# Patient Record
Sex: Female | Born: 1935
Health system: Southern US, Community
[De-identification: ages and names within clinical notes are randomized; demographics above are authoritative.]

## PROBLEM LIST (undated history)

## (undated) DIAGNOSIS — C801 Malignant (primary) neoplasm, unspecified: Secondary | ICD-10-CM

## (undated) DIAGNOSIS — E785 Hyperlipidemia, unspecified: Secondary | ICD-10-CM

## (undated) DIAGNOSIS — I10 Essential (primary) hypertension: Secondary | ICD-10-CM

## (undated) HISTORY — PX: RECONSTRUCTION OF EYELID: SHX6576

## (undated) HISTORY — DX: Hyperlipidemia, unspecified: E78.5

## (undated) HISTORY — DX: Essential (primary) hypertension: I10

---

## 1989-06-29 HISTORY — PX: CHOLECYSTECTOMY: SHX55

## 2000-11-23 ENCOUNTER — Other Ambulatory Visit: Admission: RE | Admit: 2000-11-23 | Discharge: 2000-11-23 | Payer: Self-pay | Admitting: *Deleted

## 2003-06-30 HISTORY — PX: OTHER SURGICAL HISTORY: SHX169

## 2004-05-20 ENCOUNTER — Ambulatory Visit: Payer: Self-pay | Admitting: Family Medicine

## 2004-06-27 ENCOUNTER — Ambulatory Visit: Payer: Self-pay | Admitting: General Surgery

## 2004-06-27 LAB — HM COLONOSCOPY: HM COLON: NORMAL

## 2004-11-14 ENCOUNTER — Encounter: Payer: Self-pay | Admitting: Family Medicine

## 2004-11-27 ENCOUNTER — Encounter: Payer: Self-pay | Admitting: Family Medicine

## 2004-12-27 ENCOUNTER — Encounter: Payer: Self-pay | Admitting: Family Medicine

## 2005-09-11 ENCOUNTER — Ambulatory Visit: Payer: Self-pay | Admitting: Family Medicine

## 2006-11-08 ENCOUNTER — Ambulatory Visit: Payer: Self-pay | Admitting: Ophthalmology

## 2006-11-11 ENCOUNTER — Encounter: Payer: Self-pay | Admitting: Family Medicine

## 2006-11-15 ENCOUNTER — Ambulatory Visit: Payer: Self-pay | Admitting: Ophthalmology

## 2006-11-28 ENCOUNTER — Encounter: Payer: Self-pay | Admitting: Family Medicine

## 2006-12-08 ENCOUNTER — Ambulatory Visit: Payer: Self-pay | Admitting: Ophthalmology

## 2006-12-15 ENCOUNTER — Ambulatory Visit: Payer: Self-pay | Admitting: Ophthalmology

## 2006-12-21 ENCOUNTER — Ambulatory Visit: Payer: Self-pay | Admitting: Family Medicine

## 2006-12-28 ENCOUNTER — Encounter: Payer: Self-pay | Admitting: Family Medicine

## 2007-01-28 ENCOUNTER — Encounter: Payer: Self-pay | Admitting: Family Medicine

## 2008-01-16 ENCOUNTER — Encounter: Payer: Self-pay | Admitting: Family Medicine

## 2008-01-26 ENCOUNTER — Ambulatory Visit: Payer: Self-pay | Admitting: Family Medicine

## 2008-01-28 ENCOUNTER — Encounter: Payer: Self-pay | Admitting: Family Medicine

## 2008-02-28 ENCOUNTER — Encounter: Payer: Self-pay | Admitting: Family Medicine

## 2008-09-25 ENCOUNTER — Ambulatory Visit: Payer: Self-pay

## 2009-01-23 ENCOUNTER — Ambulatory Visit: Payer: Self-pay | Admitting: Family Medicine

## 2009-06-29 DIAGNOSIS — C9110 Chronic lymphocytic leukemia of B-cell type not having achieved remission: Secondary | ICD-10-CM

## 2009-06-29 HISTORY — DX: Chronic lymphocytic leukemia of B-cell type not having achieved remission: C91.10

## 2010-04-09 ENCOUNTER — Ambulatory Visit: Payer: Self-pay | Admitting: Family Medicine

## 2010-05-19 ENCOUNTER — Ambulatory Visit: Payer: Self-pay | Admitting: Internal Medicine

## 2010-05-29 ENCOUNTER — Ambulatory Visit: Payer: Self-pay | Admitting: Internal Medicine

## 2010-06-29 ENCOUNTER — Ambulatory Visit: Payer: Self-pay | Admitting: Internal Medicine

## 2010-07-30 ENCOUNTER — Ambulatory Visit: Payer: Self-pay | Admitting: Internal Medicine

## 2010-08-28 ENCOUNTER — Ambulatory Visit: Payer: Self-pay | Admitting: Internal Medicine

## 2010-09-28 ENCOUNTER — Ambulatory Visit: Payer: Self-pay | Admitting: Internal Medicine

## 2010-10-31 ENCOUNTER — Ambulatory Visit: Payer: Self-pay | Admitting: Internal Medicine

## 2010-11-28 ENCOUNTER — Ambulatory Visit: Payer: Self-pay | Admitting: Internal Medicine

## 2011-01-09 ENCOUNTER — Ambulatory Visit: Payer: Self-pay | Admitting: Internal Medicine

## 2011-01-28 ENCOUNTER — Ambulatory Visit: Payer: Self-pay | Admitting: Internal Medicine

## 2011-04-13 ENCOUNTER — Ambulatory Visit: Payer: Self-pay | Admitting: Internal Medicine

## 2011-04-28 ENCOUNTER — Ambulatory Visit: Payer: Self-pay | Admitting: Family Medicine

## 2011-04-30 ENCOUNTER — Ambulatory Visit: Payer: Self-pay | Admitting: Internal Medicine

## 2011-07-15 ENCOUNTER — Ambulatory Visit: Payer: Self-pay | Admitting: Internal Medicine

## 2011-07-15 LAB — CBC CANCER CENTER
Basophil #: 0.4 x10 3/mm — ABNORMAL HIGH (ref 0.0–0.1)
Basophil %: 2.9 %
Eosinophil %: 0.8 %
HCT: 41.8 % (ref 35.0–47.0)
HGB: 13.8 g/dL (ref 12.0–16.0)
Lymphocyte #: 8 x10 3/mm — ABNORMAL HIGH (ref 1.0–3.6)
Lymphocyte %: 57.9 %
MCV: 84 fL (ref 80–100)
Monocyte %: 3 %
Neutrophil #: 4.9 x10 3/mm (ref 1.4–6.5)
Platelet: 292 x10 3/mm (ref 150–440)
RBC: 4.96 10*6/uL (ref 3.80–5.20)
RDW: 13.4 % (ref 11.5–14.5)
WBC: 13.8 x10 3/mm — ABNORMAL HIGH (ref 3.6–11.0)

## 2011-07-15 LAB — CREATININE, SERUM: EGFR (Non-African Amer.): >60

## 2011-07-15 LAB — LACTATE DEHYDROGENASE: LDH: 234 U/L (ref 84–246)

## 2011-07-15 LAB — URIC ACID: Uric Acid: 4.3 mg/dL (ref 2.6–6.0)

## 2011-07-31 ENCOUNTER — Ambulatory Visit: Payer: Self-pay | Admitting: Internal Medicine

## 2011-10-14 ENCOUNTER — Ambulatory Visit: Payer: Self-pay | Admitting: Internal Medicine

## 2011-10-14 LAB — CBC CANCER CENTER
Basophil %: 0.3 %
Eosinophil #: 0.1 x10 3/mm (ref 0.0–0.7)
HGB: 13.3 g/dL (ref 12.0–16.0)
Lymphocyte #: 6.8 x10 3/mm — ABNORMAL HIGH (ref 1.0–3.6)
Lymphocyte %: 56.3 %
MCH: 27.4 pg (ref 26.0–34.0)
Monocyte %: 5.7 %
Neutrophil #: 4.4 x10 3/mm (ref 1.4–6.5)
Platelet: 270 x10 3/mm (ref 150–440)
RBC: 4.88 10*6/uL (ref 3.80–5.20)

## 2011-10-14 LAB — URIC ACID: Uric Acid: 4.4 mg/dL (ref 2.6–6.0)

## 2011-10-14 LAB — CREATININE, SERUM
Creatinine: 0.63 mg/dL (ref 0.60–1.30)
EGFR (Non-African Amer.): >60

## 2011-10-28 ENCOUNTER — Ambulatory Visit: Payer: Self-pay | Admitting: Internal Medicine

## 2012-02-15 ENCOUNTER — Ambulatory Visit: Payer: Self-pay | Admitting: Internal Medicine

## 2012-02-15 LAB — HEPATIC FUNCTION PANEL A (ARMC)
Albumin: 3.7 g/dL (ref 3.4–5.0)
Alkaline Phosphatase: 89 U/L (ref 50–136)
Bilirubin, Direct: 0.1 mg/dL (ref 0.00–0.20)
Bilirubin,Total: 0.4 mg/dL (ref 0.2–1.0)
SGOT(AST): 25 U/L (ref 15–37)
SGPT (ALT): 27 U/L (ref 12–78)
Total Protein: 6.9 g/dL (ref 6.4–8.2)

## 2012-02-15 LAB — CBC CANCER CENTER
Basophil #: 0.1 x10 3/mm (ref 0.0–0.1)
Basophil %: 0.7 %
Eosinophil #: 0.1 x10 3/mm (ref 0.0–0.7)
Eosinophil %: 0.9 %
HCT: 41.7 % (ref 35.0–47.0)
HGB: 13.4 g/dL (ref 12.0–16.0)
Lymphocyte #: 7.6 x10 3/mm — ABNORMAL HIGH (ref 1.0–3.6)
Lymphocyte %: 53.2 %
MCH: 27.1 pg (ref 26.0–34.0)
MCHC: 32.1 g/dL (ref 32.0–36.0)
MCV: 84 fL (ref 80–100)
Monocyte #: 0.5 x10 3/mm (ref 0.2–0.9)
Monocyte %: 3.8 %
Neutrophil #: 6 x10 3/mm (ref 1.4–6.5)
Neutrophil %: 41.4 %
Platelet: 303 x10 3/mm (ref 150–440)
RBC: 4.94 10*6/uL (ref 3.80–5.20)
RDW: 13.6 % (ref 11.5–14.5)
WBC: 14.4 x10 3/mm — ABNORMAL HIGH (ref 3.6–11.0)

## 2012-02-15 LAB — URIC ACID: Uric Acid: 4.3 mg/dL (ref 2.6–6.0)

## 2012-02-15 LAB — CREATININE, SERUM
Creatinine: 0.76 mg/dL (ref 0.60–1.30)
EGFR (African American): >60
EGFR (Non-African Amer.): >60

## 2012-02-28 ENCOUNTER — Ambulatory Visit: Payer: Self-pay | Admitting: Internal Medicine

## 2012-03-25 LAB — HM PAP SMEAR

## 2012-04-28 ENCOUNTER — Ambulatory Visit: Payer: Self-pay | Admitting: Family Medicine

## 2012-04-28 LAB — HM MAMMOGRAPHY

## 2012-06-29 ENCOUNTER — Ambulatory Visit: Payer: Self-pay | Admitting: Internal Medicine

## 2012-07-11 LAB — CBC CANCER CENTER
Basophil #: 0 x10 3/mm (ref 0.0–0.1)
Basophil %: 0.2 %
Eosinophil #: 0.1 x10 3/mm (ref 0.0–0.7)
Eosinophil %: 0.5 %
Lymphocyte #: 11.3 x10 3/mm — ABNORMAL HIGH (ref 1.0–3.6)
Lymphocyte %: 63.1 %
MCH: 27.3 pg (ref 26.0–34.0)
MCV: 82 fL (ref 80–100)
Monocyte #: 1.1 x10 3/mm — ABNORMAL HIGH (ref 0.2–0.9)
Neutrophil #: 5.4 x10 3/mm (ref 1.4–6.5)
Neutrophil %: 30.2 %
RDW: 13.4 % (ref 11.5–14.5)
WBC: 17.9 x10 3/mm — ABNORMAL HIGH (ref 3.6–11.0)

## 2012-07-11 LAB — CREATININE, SERUM
EGFR (African American): >60
EGFR (Non-African Amer.): >60

## 2012-07-11 LAB — LACTATE DEHYDROGENASE: LDH: 199 U/L (ref 81–246)

## 2012-07-30 ENCOUNTER — Ambulatory Visit: Payer: Self-pay | Admitting: Internal Medicine

## 2012-08-15 LAB — CBC CANCER CENTER
Basophil #: 0.2 x10 3/mm — ABNORMAL HIGH (ref 0.0–0.1)
Basophil %: 1.2 %
Eosinophil #: 0.1 x10 3/mm (ref 0.0–0.7)
Eosinophil %: 1 %
HCT: 41.4 % (ref 35.0–47.0)
Lymphocyte #: 8.8 x10 3/mm — ABNORMAL HIGH (ref 1.0–3.6)
Lymphocyte %: 59.8 %
MCH: 27.4 pg (ref 26.0–34.0)
MCV: 83 fL (ref 80–100)
Monocyte #: 1 x10 3/mm — ABNORMAL HIGH (ref 0.2–0.9)
Monocyte %: 6.4 %
Neutrophil %: 31.6 %
RBC: 5.01 10*6/uL (ref 3.80–5.20)
RDW: 13.7 % (ref 11.5–14.5)
WBC: 14.7 x10 3/mm — ABNORMAL HIGH (ref 3.6–11.0)

## 2012-08-27 ENCOUNTER — Ambulatory Visit: Payer: Self-pay | Admitting: Internal Medicine

## 2012-09-27 ENCOUNTER — Ambulatory Visit: Payer: Self-pay | Admitting: Internal Medicine

## 2012-10-10 LAB — CBC CANCER CENTER
Basophil #: 0.2 x10 3/mm — ABNORMAL HIGH (ref 0.0–0.1)
Eosinophil #: 0.1 x10 3/mm (ref 0.0–0.7)
Eosinophil %: 0.6 %
HCT: 41.4 % (ref 35.0–47.0)
MCH: 27 pg (ref 26.0–34.0)
MCV: 82 fL (ref 80–100)
Monocyte #: 1 x10 3/mm — ABNORMAL HIGH (ref 0.2–0.9)
Neutrophil #: 6.3 x10 3/mm (ref 1.4–6.5)
Neutrophil %: 38.2 %
Platelet: 285 x10 3/mm (ref 150–440)
RDW: 13.9 % (ref 11.5–14.5)
WBC: 17 x10 3/mm — ABNORMAL HIGH (ref 3.6–11.0)

## 2012-10-10 LAB — LACTATE DEHYDROGENASE: LDH: 258 U/L — ABNORMAL HIGH (ref 81–246)

## 2012-10-10 LAB — CREATININE, SERUM
Creatinine: 0.92 mg/dL (ref 0.60–1.30)
EGFR (African American): >60
EGFR (Non-African Amer.): >60

## 2012-10-27 ENCOUNTER — Ambulatory Visit: Payer: Self-pay | Admitting: Internal Medicine

## 2012-11-27 ENCOUNTER — Ambulatory Visit: Payer: Self-pay | Admitting: Internal Medicine

## 2012-12-19 LAB — CBC CANCER CENTER
Basophil %: 0.2 %
Eosinophil #: 0.1 x10 3/mm (ref 0.0–0.7)
Eosinophil %: 0.5 %
HCT: 39.8 % (ref 35.0–47.0)
Monocyte %: 5.6 %
Platelet: 287 x10 3/mm (ref 150–440)
RDW: 13.5 % (ref 11.5–14.5)

## 2012-12-19 LAB — CREATININE, SERUM: Creatinine: 0.88 mg/dL (ref 0.60–1.30)

## 2012-12-27 ENCOUNTER — Ambulatory Visit: Payer: Self-pay | Admitting: Internal Medicine

## 2013-02-16 ENCOUNTER — Ambulatory Visit: Payer: Self-pay | Admitting: Internal Medicine

## 2013-02-16 LAB — CBC CANCER CENTER
Basophil %: 1.4 %
HGB: 13.3 g/dL (ref 12.0–16.0)
Lymphocyte #: 8.8 x10 3/mm — ABNORMAL HIGH (ref 1.0–3.6)
Lymphocyte %: 57.1 %
MCH: 27.2 pg (ref 26.0–34.0)
MCHC: 33.5 g/dL (ref 32.0–36.0)
MCV: 81 fL (ref 80–100)
Monocyte #: 0.9 x10 3/mm (ref 0.2–0.9)
Monocyte %: 6 %
Neutrophil %: 35 %
Platelet: 271 x10 3/mm (ref 150–440)
RBC: 4.89 10*6/uL (ref 3.80–5.20)
WBC: 15.5 x10 3/mm — ABNORMAL HIGH (ref 3.6–11.0)

## 2013-02-16 LAB — CREATININE, SERUM
Creatinine: 0.93 mg/dL (ref 0.60–1.30)
EGFR (Non-African Amer.): 60 — ABNORMAL LOW

## 2013-02-16 LAB — LACTATE DEHYDROGENASE: LDH: 204 U/L (ref 81–246)

## 2013-02-27 ENCOUNTER — Ambulatory Visit: Payer: Self-pay | Admitting: Internal Medicine

## 2013-04-13 ENCOUNTER — Ambulatory Visit: Payer: Self-pay | Admitting: Internal Medicine

## 2013-04-13 ENCOUNTER — Ambulatory Visit: Payer: Self-pay | Admitting: Family Medicine

## 2013-04-13 LAB — CREATININE, SERUM
EGFR (African American): >60
EGFR (Non-African Amer.): >60

## 2013-04-13 LAB — CBC CANCER CENTER
Basophil #: 0.2 x10 3/mm — ABNORMAL HIGH (ref 0.0–0.1)
Basophil %: 1.1 %
Eosinophil #: 0.1 x10 3/mm (ref 0.0–0.7)
HCT: 42.3 % (ref 35.0–47.0)
HGB: 13.9 g/dL (ref 12.0–16.0)
Lymphocyte #: 9 x10 3/mm — ABNORMAL HIGH (ref 1.0–3.6)
Lymphocyte %: 58 %
MCH: 27.3 pg (ref 26.0–34.0)
MCV: 83 fL (ref 80–100)
Monocyte #: 0.8 x10 3/mm (ref 0.2–0.9)
Monocyte %: 5 %
Neutrophil #: 5.5 x10 3/mm (ref 1.4–6.5)
Neutrophil %: 35.1 %
Platelet: 315 x10 3/mm (ref 150–440)

## 2013-04-13 LAB — LACTATE DEHYDROGENASE: LDH: 210 U/L (ref 81–246)

## 2013-04-29 ENCOUNTER — Ambulatory Visit: Payer: Self-pay | Admitting: Internal Medicine

## 2013-07-11 ENCOUNTER — Ambulatory Visit: Payer: Self-pay | Admitting: Internal Medicine

## 2013-07-11 LAB — CBC CANCER CENTER
BASOS PCT: 1.4 %
Basophil #: 0.2 x10 3/mm — ABNORMAL HIGH (ref 0.0–0.1)
Eosinophil #: 0.1 x10 3/mm (ref 0.0–0.7)
Eosinophil %: 0.8 %
HCT: 40.5 % (ref 35.0–47.0)
HGB: 13.3 g/dL (ref 12.0–16.0)
LYMPHS ABS: 8.3 x10 3/mm — AB (ref 1.0–3.6)
Lymphocyte %: 60.6 %
MCH: 26.9 pg (ref 26.0–34.0)
MCHC: 32.8 g/dL (ref 32.0–36.0)
MCV: 82 fL (ref 80–100)
MONO ABS: 0.8 x10 3/mm (ref 0.2–0.9)
MONOS PCT: 5.7 %
NEUTROS ABS: 4.3 x10 3/mm (ref 1.4–6.5)
Neutrophil %: 31.5 %
Platelet: 276 x10 3/mm (ref 150–440)
RBC: 4.95 10*6/uL (ref 3.80–5.20)
RDW: 13.9 % (ref 11.5–14.5)
WBC: 13.7 x10 3/mm — ABNORMAL HIGH (ref 3.6–11.0)

## 2013-07-11 LAB — CREATININE, SERUM
Creatinine: 0.65 mg/dL (ref 0.60–1.30)
EGFR (African American): >60

## 2013-07-11 LAB — LACTATE DEHYDROGENASE: LDH: 210 U/L (ref 81–246)

## 2013-07-30 ENCOUNTER — Ambulatory Visit: Payer: Self-pay | Admitting: Internal Medicine

## 2013-08-27 ENCOUNTER — Ambulatory Visit: Payer: Self-pay | Admitting: Internal Medicine

## 2013-11-14 ENCOUNTER — Ambulatory Visit: Payer: Self-pay | Admitting: Internal Medicine

## 2013-11-15 LAB — CBC CANCER CENTER
BASOS ABS: 0.2 x10 3/mm — AB (ref 0.0–0.1)
Basophil %: 1.2 %
EOS PCT: 0.7 %
Eosinophil #: 0.1 x10 3/mm (ref 0.0–0.7)
HCT: 39.3 % (ref 35.0–47.0)
HGB: 13.3 g/dL (ref 12.0–16.0)
LYMPHS ABS: 8.4 x10 3/mm — AB (ref 1.0–3.6)
Lymphocyte %: 62.4 %
MCH: 27.7 pg (ref 26.0–34.0)
MCHC: 33.9 g/dL (ref 32.0–36.0)
MCV: 82 fL (ref 80–100)
Monocyte #: 0.6 x10 3/mm (ref 0.2–0.9)
Monocyte %: 4.7 %
NEUTROS ABS: 4.2 x10 3/mm (ref 1.4–6.5)
NEUTROS PCT: 31 %
Platelet: 272 x10 3/mm (ref 150–440)
RBC: 4.81 10*6/uL (ref 3.80–5.20)
RDW: 12.9 % (ref 11.5–14.5)
WBC: 13.5 x10 3/mm — AB (ref 3.6–11.0)

## 2013-11-15 LAB — CREATININE, SERUM
Creatinine: 0.66 mg/dL (ref 0.60–1.30)
EGFR (Non-African Amer.): >60

## 2013-11-15 LAB — LACTATE DEHYDROGENASE: LDH: 193 U/L (ref 81–246)

## 2013-11-15 LAB — URIC ACID: URIC ACID: 4.8 mg/dL (ref 2.6–6.0)

## 2013-11-27 ENCOUNTER — Ambulatory Visit: Payer: Self-pay | Admitting: Internal Medicine

## 2014-03-19 ENCOUNTER — Ambulatory Visit: Payer: Self-pay | Admitting: Internal Medicine

## 2014-03-19 LAB — CBC CANCER CENTER
BASOS ABS: 0.2 x10 3/mm — AB (ref 0.0–0.1)
BASOS PCT: 1.2 %
Eosinophil #: 0.1 x10 3/mm (ref 0.0–0.7)
Eosinophil %: 0.7 %
HCT: 41.2 % (ref 35.0–47.0)
HGB: 13.4 g/dL (ref 12.0–16.0)
LYMPHS ABS: 8.1 x10 3/mm — AB (ref 1.0–3.6)
LYMPHS PCT: 58.3 %
MCH: 27.5 pg (ref 26.0–34.0)
MCHC: 32.6 g/dL (ref 32.0–36.0)
MCV: 84 fL (ref 80–100)
MONO ABS: 0.6 x10 3/mm (ref 0.2–0.9)
MONOS PCT: 4.6 %
NEUTROS PCT: 35.2 %
Neutrophil #: 4.9 x10 3/mm (ref 1.4–6.5)
PLATELETS: 320 x10 3/mm (ref 150–440)
RBC: 4.88 10*6/uL (ref 3.80–5.20)
RDW: 13.3 % (ref 11.5–14.5)
WBC: 13.9 x10 3/mm — ABNORMAL HIGH (ref 3.6–11.0)

## 2014-03-19 LAB — CREATININE, SERUM: Creatinine: 0.78 mg/dL (ref 0.60–1.30)

## 2014-03-19 LAB — LACTATE DEHYDROGENASE: LDH: 187 U/L (ref 81–246)

## 2014-03-19 LAB — URIC ACID: Uric Acid: 4.5 mg/dL (ref 2.6–6.0)

## 2014-03-29 ENCOUNTER — Ambulatory Visit: Payer: Self-pay | Admitting: Internal Medicine

## 2014-04-03 LAB — BASIC METABOLIC PANEL
BUN: 14 mg/dL (ref 4–21)
CREATININE: 0.7 mg/dL (ref 0.5–1.1)
Glucose: 97 mg/dL
POTASSIUM: 4.4 mmol/L (ref 3.4–5.3)
Sodium: 142 mmol/L (ref 137–147)

## 2014-04-03 LAB — LIPID PANEL
CHOLESTEROL: 191 mg/dL (ref 0–200)
HDL: 55 mg/dL (ref 35–70)
LDL CALC: 102 mg/dL
Triglycerides: 169 mg/dL — AB (ref 40–160)

## 2014-04-03 LAB — TSH: TSH: 5.11 u[IU]/mL (ref 0.41–5.90)

## 2014-04-03 LAB — HEPATIC FUNCTION PANEL
ALT: 35 U/L (ref 7–35)
AST: 25 U/L (ref 13–35)

## 2014-04-13 LAB — HM DEXA SCAN

## 2014-08-15 ENCOUNTER — Ambulatory Visit: Payer: Self-pay | Admitting: Internal Medicine

## 2014-08-28 ENCOUNTER — Ambulatory Visit
Admit: 2014-08-28 | Disposition: A | Discharge status: Home / Self Care | Payer: Self-pay | Attending: Internal Medicine | Admitting: Internal Medicine

## 2014-10-11 DIAGNOSIS — R9431 Abnormal electrocardiogram [ECG] [EKG]: Secondary | ICD-10-CM | POA: Insufficient documentation

## 2014-10-11 DIAGNOSIS — I10 Essential (primary) hypertension: Secondary | ICD-10-CM | POA: Insufficient documentation

## 2014-10-25 DIAGNOSIS — F419 Anxiety disorder, unspecified: Secondary | ICD-10-CM | POA: Insufficient documentation

## 2014-10-25 DIAGNOSIS — E038 Other specified hypothyroidism: Secondary | ICD-10-CM | POA: Insufficient documentation

## 2014-10-25 DIAGNOSIS — I10 Essential (primary) hypertension: Secondary | ICD-10-CM | POA: Insufficient documentation

## 2014-10-25 DIAGNOSIS — C9110 Chronic lymphocytic leukemia of B-cell type not having achieved remission: Secondary | ICD-10-CM | POA: Insufficient documentation

## 2014-10-25 DIAGNOSIS — E039 Hypothyroidism, unspecified: Secondary | ICD-10-CM | POA: Insufficient documentation

## 2014-10-25 DIAGNOSIS — E785 Hyperlipidemia, unspecified: Secondary | ICD-10-CM | POA: Insufficient documentation

## 2014-12-11 ENCOUNTER — Other Ambulatory Visit: Payer: Self-pay

## 2014-12-11 ENCOUNTER — Ambulatory Visit: Payer: Self-pay | Admitting: Family Medicine

## 2014-12-12 ENCOUNTER — Ambulatory Visit: Payer: Self-pay | Admitting: Family Medicine

## 2014-12-12 ENCOUNTER — Other Ambulatory Visit: Payer: Self-pay

## 2014-12-26 ENCOUNTER — Encounter: Payer: Self-pay | Admitting: Family Medicine

## 2014-12-26 ENCOUNTER — Ambulatory Visit (INDEPENDENT_AMBULATORY_CARE_PROVIDER_SITE_OTHER): Payer: Commercial Managed Care - HMO | Admitting: Family Medicine

## 2014-12-26 VITALS — BP 112/64 | HR 80 | Temp 98.1°F | Resp 16 | Ht 60.75 in | Wt 138.0 lb

## 2014-12-26 DIAGNOSIS — Z0000 Encounter for general adult medical examination without abnormal findings: Secondary | ICD-10-CM | POA: Diagnosis not present

## 2014-12-26 DIAGNOSIS — I10 Essential (primary) hypertension: Secondary | ICD-10-CM

## 2014-12-26 DIAGNOSIS — D692 Other nonthrombocytopenic purpura: Secondary | ICD-10-CM

## 2014-12-26 DIAGNOSIS — E039 Hypothyroidism, unspecified: Secondary | ICD-10-CM

## 2014-12-26 DIAGNOSIS — E038 Other specified hypothyroidism: Secondary | ICD-10-CM | POA: Diagnosis not present

## 2014-12-26 NOTE — Progress Notes (Signed)
Patient ID: Barbara Chambers, female   DOB: [birth year], 80 y.o.   MRN: [id number]       Patient: Barbara Chambers, Female    DOB: 1935/01/13, 80 y.o.   MRN: [id number] Visit Date: 12/26/2014  Today's Provider: Margarita Rana, MD   Chief Complaint  Patient presents with  . Medicare Wellness   Subjective:    Annual wellness visit Barbara Chambers is a 80 y.o. female who presents today for her Subsequent Annual Wellness Visit. She feels well. She reports exercising everyday and does yard work at home. She reports she is sleeping fairly well, pt reports she sleeps 6 hours. Does bruise fairly easily.   Last CPE: 03/29/2013 03/25/2012 Pap-neg 11/2013 Mammogram 04/13/2013 BMD-Normal  06/27/2004 Colonoscopy-normal 03/29/2013 EKG -----------------------------------------------------------   Review of Systems  Constitutional: Negative.   HENT: Negative.   Eyes: Negative.   Respiratory: Negative.   Cardiovascular: Negative.   Gastrointestinal: Negative.   Endocrine: Negative.   Genitourinary: Negative.   Musculoskeletal: Positive for back pain and neck pain.  Skin: Negative.   Allergic/Immunologic: Negative.   Neurological: Negative.   Hematological: Negative.   Psychiatric/Behavioral: Negative.     History   Social History  . Marital Status: Married    Spouse Name: Casandra Doffing  . Number of Children: 3  . Years of Education: N/A   Occupational History  . retired    Social History Main Topics  . Smoking status: Never Smoker   . Smokeless tobacco: Never Used  . Alcohol Use: No  . Drug Use: No  . Sexual Activity: Not on file   Other Topics Concern  . Not on file   Social History Narrative    Patient Active Problem List   Diagnosis Date Noted  . Anxiety 10/25/2014  . Chronic lymphatic leukemia 10/25/2014  . HLD (hyperlipidemia) 10/25/2014  . BP (high blood pressure) 10/25/2014  . Subclinical hypothyroidism 10/25/2014  . Abnormal ECG 10/11/2014  . Benign essential HTN  10/11/2014    Past Surgical History  Procedure Laterality Date  . Cholecystectomy  1991  . Cataract surgery Bilateral 2005    Her family history includes CAD in her mother; Heart attack in her father; Hypertension in her brother, father, and mother; Seizures in her brother; Stroke in her mother.    Previous Medications   ASCORBIC ACID (VITAMIN C CR) 1000 MG TBCR    Take by mouth.   ASPIRIN 81 MG TABLET    Take by mouth.   COENZYME Q-10 PO    Take by mouth.   LORAZEPAM (ATIVAN) 0.5 MG TABLET    Take by mouth.   LOSARTAN-HYDROCHLOROTHIAZIDE (HYZAAR) 50-12.5 MG PER TABLET    Take by mouth.   SIMVASTATIN (ZOCOR) 5 MG TABLET    Take by mouth.    Patient Care Team: Margarita Rana, MD as PCP - General (Family Medicine)     Objective:   Vitals: BP 112/64 mmHg  Pulse 80  Temp(Src) 98.1 F (36.7 C) (Oral)  Resp 16  Ht 5' 0.75" (1.543 m)  Wt 138 lb (62.596 kg)  BMI 26.29 kg/m2  Physical Exam  Constitutional: She is oriented to person, place, and time. She appears well-developed and well-nourished.  HENT:  Head: Normocephalic and atraumatic.  Right Ear: Tympanic membrane, external ear and ear canal normal.  Left Ear: Tympanic membrane, external ear and ear canal normal.  Nose: Nose normal.  Mouth/Throat: Uvula is midline, oropharynx is clear and moist and mucous membranes are normal.  Eyes: Conjunctivae, EOM  and lids are normal. Pupils are equal, round, and reactive to light.  Neck: Trachea normal and normal range of motion. Neck supple. Carotid bruit is not present. No thyroid mass and no thyromegaly present.  Cardiovascular: Normal rate, regular rhythm and normal heart sounds.   Pulmonary/Chest: Effort normal and breath sounds normal. Right breast exhibits inverted nipple. Left breast exhibits inverted nipple.  Abdominal: Soft. Normal appearance and bowel sounds are normal. There is no hepatosplenomegaly. There is no tenderness.  Genitourinary: No breast swelling, tenderness or  discharge.  Musculoskeletal: Normal range of motion.  Lymphadenopathy:    She has no cervical adenopathy.    She has no axillary adenopathy.  Neurological: She is alert and oriented to person, place, and time. She has normal strength. No cranial nerve deficit.  Skin: Skin is warm, dry and intact. Bruising noted.  Psychiatric: She has a normal mood and affect. Her speech is normal and behavior is normal. Judgment and thought content normal. Cognition and memory are normal.    Activities of Daily Living In your present state of health, do you have any difficulty performing the following activities: 12/26/2014  Hearing? N  Vision? N  Difficulty concentrating or making decisions? N  Walking or climbing stairs? N  Dressing or bathing? N  Doing errands, shopping? N    Fall Risk Assessment Fall Risk  12/26/2014  Falls in the past year? No     Depression Screen PHQ 2/9 Scores 12/26/2014  PHQ - 2 Score 0    Cognitive Testing - 6-CIT  Correct? Score   What year is it? yes 0 0 or 4  What month is it? yes 0 0 or 3  Memorize:    Barbara Chambers,  44,  [address],  [place],      What time is it? (within 1 hour) yes 0 0 or 3  Count backwards from 20 yes 0 0, 2, or 4  Name the months of the year yes 0 0, 2, or 4  Repeat name & address above yes 0 0, 2, 4, 6, 8, or 10       TOTAL SCORE  0/28   Interpretation:  Normal  Normal (0-7) Abnormal (8-28)       Assessment & Plan:     Annual Wellness Visit  Reviewed patient's Family Medical History Reviewed and updated list of patient's medical providers Assessment of cognitive impairment was done Assessed patient's functional ability Established a written schedule for health screening [place] Completed and Reviewed  Exercise Activities and Dietary recommendations Goals    None      Immunization History  Administered Date(s) Administered  . Pneumococcal Conjugate-13 03/30/2014  . Tdap 07/14/2005    Health  Maintenance  Topic Date Due  . ZOSTAVAX  02/15/1997  . COLONOSCOPY  06/27/2014  . INFLUENZA VACCINE  01/28/2015  . PNA vac Low Risk Adult (2 of 2 - PPSV23) 03/31/2015  . TETANUS/TDAP  07/15/2015  . DEXA SCAN  Completed       1. Medicare annual wellness visit, subsequent Stable. Patient advised to continue eating healthy and exercise daily.   2. Subclinical hypothyroidism F/U pending lab report. - TSH  3. Benign essential HTN F/U pending lab report. - Comprehensive metabolic panel  4. Senile purpura Related to sun and blood thinners. Continue blood thinners secondary to other medical problems.     Patient seen and examined by Dr. Jerrell Belfast, and note scribed by Philbert Riser. Dimas, CMA. I  have reviewed the document for accuracy and completeness and I agree with above. Jerrell Belfast, MD   Margarita Rana, MD       ------------------------------------------------------------------------------------------------------------

## 2015-01-05 LAB — COMPREHENSIVE METABOLIC PANEL
A/G RATIO: 2 (ref 1.1–2.5)
ALK PHOS: 80 IU/L (ref 39–117)
ALT: 23 IU/L (ref 0–32)
AST: 28 IU/L (ref 0–40)
Albumin: 4.3 g/dL (ref 3.5–4.8)
BUN/Creatinine Ratio: 20 (ref 11–26)
BUN: 14 mg/dL (ref 8–27)
Bilirubin Total: 0.5 mg/dL (ref 0.0–1.2)
CHLORIDE: 97 mmol/L (ref 97–108)
CO2: 26 mmol/L (ref 18–29)
Calcium: 9.3 mg/dL (ref 8.7–10.3)
Creatinine, Ser: 0.71 mg/dL (ref 0.57–1.00)
GFR, EST AFRICAN AMERICAN: 95 mL/min/{1.73_m2} (ref >59)
GFR, EST NON AFRICAN AMERICAN: 82 mL/min/{1.73_m2} (ref >59)
Globulin, Total: 2.2 g/dL (ref 1.5–4.5)
Glucose: 121 mg/dL — ABNORMAL HIGH (ref 65–99)
POTASSIUM: 3.9 mmol/L (ref 3.5–5.2)
SODIUM: 140 mmol/L (ref 134–144)
Total Protein: 6.5 g/dL (ref 6.0–8.5)

## 2015-01-05 LAB — TSH: TSH: 3.91 u[IU]/mL (ref 0.450–4.500)

## 2015-01-07 ENCOUNTER — Telehealth: Payer: Self-pay

## 2015-01-07 NOTE — Telephone Encounter (Signed)
Pt advised as directed below.  She reported that she was not fasting.    Thanks,   -Mickel Baas

## 2015-01-07 NOTE — Telephone Encounter (Signed)
-----   Message from Margarita Rana, MD sent at 01/05/2015  6:29 AM EDT ----- Labs stable as long as patient was not fasting at time of blood draw. Thanks.

## 2015-01-11 ENCOUNTER — Other Ambulatory Visit: Payer: Self-pay | Admitting: *Deleted

## 2015-01-11 DIAGNOSIS — D72820 Lymphocytosis (symptomatic): Secondary | ICD-10-CM

## 2015-01-11 DIAGNOSIS — C9110 Chronic lymphocytic leukemia of B-cell type not having achieved remission: Secondary | ICD-10-CM

## 2015-01-14 ENCOUNTER — Encounter: Payer: Self-pay | Admitting: Oncology

## 2015-01-14 ENCOUNTER — Inpatient Hospital Stay (HOSPITAL_BASED_OUTPATIENT_CLINIC_OR_DEPARTMENT_OTHER): Payer: Commercial Managed Care - HMO | Admitting: Oncology

## 2015-01-14 ENCOUNTER — Inpatient Hospital Stay: Payer: Commercial Managed Care - HMO | Attending: Oncology

## 2015-01-14 VITALS — BP 122/76 | HR 76 | Temp 97.0°F | Resp 18 | Wt 136.9 lb

## 2015-01-14 DIAGNOSIS — Z79899 Other long term (current) drug therapy: Secondary | ICD-10-CM | POA: Diagnosis not present

## 2015-01-14 DIAGNOSIS — D72820 Lymphocytosis (symptomatic): Secondary | ICD-10-CM

## 2015-01-14 DIAGNOSIS — E785 Hyperlipidemia, unspecified: Secondary | ICD-10-CM

## 2015-01-14 DIAGNOSIS — Z7982 Long term (current) use of aspirin: Secondary | ICD-10-CM | POA: Insufficient documentation

## 2015-01-14 DIAGNOSIS — C9110 Chronic lymphocytic leukemia of B-cell type not having achieved remission: Secondary | ICD-10-CM

## 2015-01-14 DIAGNOSIS — I10 Essential (primary) hypertension: Secondary | ICD-10-CM | POA: Insufficient documentation

## 2015-01-14 DIAGNOSIS — Z9289 Personal history of other medical treatment: Secondary | ICD-10-CM

## 2015-01-14 LAB — CBC WITH DIFFERENTIAL/PLATELET
Basophils Absolute: 0.1 10*3/uL (ref 0–0.1)
Basophils Relative: 1 %
Eosinophils Absolute: 0.1 10*3/uL (ref 0–0.7)
Eosinophils Relative: 1 %
HCT: 40.5 % (ref 35.0–47.0)
Hemoglobin: 13.2 g/dL (ref 12.0–16.0)
LYMPHS ABS: 11.2 10*3/uL — AB (ref 1.0–3.6)
LYMPHS PCT: 66 %
MCH: 27.1 pg (ref 26.0–34.0)
MCHC: 32.5 g/dL (ref 32.0–36.0)
MCV: 83.2 fL (ref 80.0–100.0)
Monocytes Absolute: 0.8 10*3/uL (ref 0.2–0.9)
Monocytes Relative: 5 %
NEUTROS ABS: 4.5 10*3/uL (ref 1.4–6.5)
Neutrophils Relative %: 27 %
Platelets: 264 10*3/uL (ref 150–440)
RBC: 4.87 MIL/uL (ref 3.80–5.20)
RDW: 13.7 % (ref 11.5–14.5)
WBC: 16.8 10*3/uL — AB (ref 3.6–11.0)

## 2015-01-14 LAB — URIC ACID: Uric Acid, Serum: 5.4 mg/dL (ref 2.3–6.6)

## 2015-01-14 LAB — LACTATE DEHYDROGENASE: LDH: 167 U/L (ref 98–192)

## 2015-01-14 LAB — CREATININE, SERUM: CREATININE: 0.62 mg/dL (ref 0.44–1.00)

## 2015-01-22 ENCOUNTER — Ambulatory Visit
Admission: RE | Admit: 2015-01-22 | Discharge: 2015-01-22 | Disposition: A | Discharge status: Home / Self Care | Payer: Commercial Managed Care - HMO | Source: Ambulatory Visit | Attending: Oncology | Admitting: Oncology

## 2015-01-22 DIAGNOSIS — Z1231 Encounter for screening mammogram for malignant neoplasm of breast: Secondary | ICD-10-CM | POA: Diagnosis not present

## 2015-01-22 DIAGNOSIS — Z9289 Personal history of other medical treatment: Secondary | ICD-10-CM

## 2015-01-22 HISTORY — DX: Malignant (primary) neoplasm, unspecified: C80.1

## 2015-01-26 NOTE — Progress Notes (Signed)
[place]  [phone] [phone]  ID: Barbara Chambers OB: [birth year]  MR#: [id number]  XTG#:[id number]  Patient Care Team: Margarita Rana, MD as PCP - General (Family Medicine)  CHIEF COMPLAINT:  Chief Complaint  Patient presents with  . Follow-up    CLL    INTERVAL HISTORY: Patient returns to clinic today for repeat laboratory work and further evaluation. She continues to feel well and remains asymptomatic. She denies any fevers, night sweats, or weight loss. She has no neurologic complaints. She denies any chest pain or shortness of breath. She denies any nausea, vomiting, constipation, or diarrhea. She has no urinary complaints. Patient feels at her baseline and offers no specific complaints today.  REVIEW OF SYSTEMS:   Review of Systems  Constitutional: Negative for fever, weight loss and diaphoresis.  Respiratory: Negative.   Cardiovascular: Negative.   Musculoskeletal: Negative.     As per HPI. Otherwise, a complete review of systems is negatve.  PAST MEDICAL HISTORY: Past Medical History  Diagnosis Date  . Hypertension   . Hyperlipidemia   . Cancer 2011    lymphoma    PAST SURGICAL HISTORY: Past Surgical History  Procedure Laterality Date  . Cholecystectomy  1991  . Cataract surgery Bilateral 2005    FAMILY HISTORY Family History  Problem Relation Age of Onset  . Hypertension Mother   . Stroke Mother   . CAD Mother   . Hypertension Father   . Heart attack Father   . Hypertension Brother   . Seizures Brother        ADVANCED DIRECTIVES:    HEALTH MAINTENANCE: History  Substance Use Topics  . Smoking status: Never Smoker   . Smokeless tobacco: Never Used  . Alcohol Use: No     Colonoscopy:  PAP:  Bone density:  Lipid panel:  Allergies  Allergen Reactions  . Penicillins     Current Outpatient Prescriptions  Medication Sig Dispense Refill  . Ascorbic Acid (VITAMIN C CR) 1000 MG TBCR Take by mouth.     [name] aspirin 81 MG tablet Take by mouth.    [name] COENZYME Q-10 PO Take by mouth.    [name] LORazepam (ATIVAN) 0.5 MG tablet Take by mouth.    . losartan-hydrochlorothiazide (HYZAAR) 50-12.5 MG per tablet Take by mouth.    . simvastatin (ZOCOR) 5 MG tablet Take by mouth.     No current facility-administered medications for this visit.    OBJECTIVE: Filed Vitals:   01/14/15 1527  BP: 122/76  Pulse: 76  Temp: 97 F (36.1 C)  Resp: 18     Body mass index is 26.08 kg/(m^2).    ECOG FS:0 - Asymptomatic  General: Well-developed, well-nourished, no acute distress. Eyes: Pink conjunctiva, anicteric sclera. HEENT: Normocephalic, moist mucous membranes, clear oropharnyx. Lungs: Clear to auscultation bilaterally. Heart: Regular rate and rhythm. No rubs, murmurs, or gallops. Abdomen: Soft, nontender, nondistended. No organomegaly noted, normoactive bowel sounds. Musculoskeletal: No edema, cyanosis, or clubbing. Neuro: Alert, answering all questions appropriately. Cranial nerves grossly intact. Skin: No rashes or petechiae noted. Psych: Normal affect. Lymphatics: No cervical, calvicular, axillary or inguinal LAD.   LAB RESULTS:  Lab Results  Component Value Date   NA 140 01/04/2015   K 3.9 01/04/2015   CL 97 01/04/2015   CO2 26 01/04/2015   GLUCOSE 121* 01/04/2015   BUN 14 01/04/2015   CREATININE 0.62 01/14/2015   CALCIUM 9.3 01/04/2015   PROT 6.5 01/04/2015   ALBUMIN 3.7 02/15/2012  AST 28 01/04/2015   ALT 23 01/04/2015   ALKPHOS 80 01/04/2015   BILITOT 0.5 01/04/2015   GFRNONAA >60 01/14/2015   GFRAA >60 01/14/2015    Lab Results  Component Value Date   WBC 16.8* 01/14/2015   NEUTROABS 4.5 01/14/2015   HGB 13.2 01/14/2015   HCT 40.5 01/14/2015   MCV 83.2 01/14/2015   PLT 264 01/14/2015     STUDIES: Mm Digital Screening Bilateral  01/22/2015   CLINICAL DATA:  Screening.  EXAM: DIGITAL SCREENING BILATERAL MAMMOGRAM WITH CAD  COMPARISON:  Previous exam(s).  ACR Breast  Density Category b: There are scattered areas of fibroglandular density.  FINDINGS: There are no findings suspicious for malignancy. Images were processed with CAD.  IMPRESSION: No mammographic evidence of malignancy. A result letter of this screening mammogram will be mailed directly to the patient.  RECOMMENDATION: Screening mammogram in one year. (Code:SM-B-01Y)  BI-RADS CATEGORY  1: Negative.   Electronically Signed   By: Pamelia Hoit M.D.   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  PLAN:    1. CLL variant: Patient's white blood cell count is mildly elevated, but essentially unchanged. Previously CT scan in 2011 did not reveal any significant lymphadenopathy. This does not need to be repeated unless there is suspicion of progression of disease. No intervention is needed at this time. Return to clinic in 6 months with repeat laboratory work and further evaluation.  Patient expressed understanding and was in agreement with this plan. She also understands that She can call clinic at any time with any questions, concerns, or complaints.    Lloyd Huger, MD   01/26/2015 4:09 PM

## 2015-03-08 ENCOUNTER — Telehealth: Payer: Self-pay

## 2015-03-08 ENCOUNTER — Ambulatory Visit
Admission: RE | Admit: 2015-03-08 | Discharge: 2015-03-08 | Disposition: A | Discharge status: Home / Self Care | Payer: Commercial Managed Care - HMO | Source: Ambulatory Visit | Attending: Family Medicine | Admitting: Family Medicine

## 2015-03-08 ENCOUNTER — Ambulatory Visit (INDEPENDENT_AMBULATORY_CARE_PROVIDER_SITE_OTHER): Payer: Commercial Managed Care - HMO | Admitting: Family Medicine

## 2015-03-08 ENCOUNTER — Encounter: Payer: Self-pay | Admitting: Family Medicine

## 2015-03-08 VITALS — BP 130/80 | HR 80 | Temp 98.1°F | Resp 16 | Wt 136.0 lb

## 2015-03-08 DIAGNOSIS — M5136 Other intervertebral disc degeneration, lumbar region: Secondary | ICD-10-CM | POA: Insufficient documentation

## 2015-03-08 DIAGNOSIS — N3001 Acute cystitis with hematuria: Secondary | ICD-10-CM

## 2015-03-08 DIAGNOSIS — M545 Low back pain, unspecified: Secondary | ICD-10-CM

## 2015-03-08 DIAGNOSIS — R8290 Unspecified abnormal findings in urine: Secondary | ICD-10-CM

## 2015-03-08 DIAGNOSIS — E785 Hyperlipidemia, unspecified: Secondary | ICD-10-CM | POA: Diagnosis not present

## 2015-03-08 DIAGNOSIS — M791 Myalgia, unspecified site: Secondary | ICD-10-CM

## 2015-03-08 DIAGNOSIS — C9110 Chronic lymphocytic leukemia of B-cell type not having achieved remission: Secondary | ICD-10-CM

## 2015-03-08 DIAGNOSIS — M1990 Unspecified osteoarthritis, unspecified site: Secondary | ICD-10-CM | POA: Diagnosis not present

## 2015-03-08 LAB — POCT URINALYSIS DIPSTICK
Bilirubin, UA: NEGATIVE
Glucose, UA: NEGATIVE
KETONES UA: NEGATIVE
Nitrite, UA: NEGATIVE
Protein, UA: NEGATIVE
Spec Grav, UA: 1.01
Urobilinogen, UA: 0.2
pH, UA: 7

## 2015-03-08 NOTE — Telephone Encounter (Signed)
-----   Message from Margarita Rana, MD sent at 03/08/2015  1:09 PM EDT ----- Arthritis as expected. No compression fracture.  Thanks- Izora Gala

## 2015-03-08 NOTE — Progress Notes (Signed)
Subjective:    Patient ID: Barbara Chambers, female    DOB: 05-Jun-1935, 80 y.o.   MRN: [id number]  Back Pain This is a new problem. The current episode started in the past 7 days. The problem has been gradually improving since onset. Pain location: left lower back. Radiates to: left hip. The pain is at a severity of 3/10 (was a 7/10 the day of the incident). The pain is mild. The pain is worse during the day. Exacerbated by: walking. Stiffness is present in the morning. Associated symptoms include headaches (occasional temporal H/A), leg pain (crmaps) and tingling. Pertinent negatives include no abdominal pain, bladder incontinence, bowel incontinence, chest pain, dysuria, fever, numbness, pelvic pain, weakness or weight loss. (Malodorous urine is present) Treatments tried: Tylenol. The treatment provided moderate relief.   Malodorous urine.  No fever. No leaking urine. This has been going on a couple of months.     Review of Systems  Constitutional: Negative for fever and weight loss.  Cardiovascular: Negative for chest pain.  Gastrointestinal: Negative for abdominal pain and bowel incontinence.  Genitourinary: Negative for bladder incontinence, dysuria and pelvic pain.       Malodorous urine is present  Musculoskeletal: Positive for back pain.  Neurological: Positive for tingling and headaches (occasional temporal H/A). Negative for weakness and numbness.   BP 130/80 mmHg  Pulse 80  Temp(Src) 98.1 F (36.7 C) (Oral)  Resp 16  Wt 136 lb (61.689 kg)   Patient Active Problem List   Diagnosis Date Noted  . Anxiety 10/25/2014  . Chronic lymphatic leukemia 10/25/2014  . HLD (hyperlipidemia) 10/25/2014  . BP (high blood pressure) 10/25/2014  . Subclinical hypothyroidism 10/25/2014  . Abnormal ECG 10/11/2014  . Benign essential HTN 10/11/2014   Past Medical History  Diagnosis Date  . Hypertension   . Hyperlipidemia   . Cancer 2011    lymphoma   Current Outpatient Prescriptions on  File Prior to Visit  Medication Sig  . Ascorbic Acid (VITAMIN C CR) 1000 MG TBCR Take by mouth.  [name] aspirin 81 MG tablet Take by mouth.  [name] COENZYME Q-10 PO Take by mouth.  . losartan-hydrochlorothiazide (HYZAAR) 50-12.5 MG per tablet Take by mouth.  . simvastatin (ZOCOR) 5 MG tablet Take by mouth.  [name] LORazepam (ATIVAN) 0.5 MG tablet Take by mouth.   No current facility-administered medications on file prior to visit.   Allergies  Allergen Reactions  . Penicillins    Past Surgical History  Procedure Laterality Date  . Cholecystectomy  1991  . Cataract surgery Bilateral 2005   Social History   Social History  . Marital Status: Married    Spouse Name: Casandra Doffing  . Number of Children: 3  . Years of Education: N/A   Occupational History  . retired    Social History Main Topics  . Smoking status: Never Smoker   . Smokeless tobacco: Never Used  . Alcohol Use: No  . Drug Use: No  . Sexual Activity: Not on file   Other Topics Concern  . Not on file   Social History Narrative   Family History  Problem Relation Age of Onset  . Hypertension Mother   . Stroke Mother   . CAD Mother   . Hypertension Father   . Heart attack Father   . Hypertension Brother   . Seizures Brother       Objective:   Physical Exam  Constitutional: She is oriented to person, place, and time. She appears well-developed and  well-nourished.  Cardiovascular: Normal rate and regular rhythm.   Pulmonary/Chest: Effort normal and breath sounds normal.  Musculoskeletal: Normal range of motion. She exhibits tenderness.  Mild low back tenderness.  No radiation. Straight leg raises negative.   Neurological: She is alert and oriented to person, place, and time.  Psychiatric: She has a normal mood and affect. Her behavior is normal. Judgment and thought content normal.      Results for orders placed or performed in visit on 03/08/15  POCT urinalysis dipstick  Result Value Ref Range   Color, UA yellow     Clarity, UA cloudy    Glucose, UA neg    Bilirubin, UA neg    Ketones, UA neg    Spec Grav, UA 1.010    Blood, UA NH Trace    pH, UA 7.0    Protein, UA neg    Urobilinogen, UA 0.2    Nitrite, UA neg    Leukocytes, UA moderate (2+) (A) Negative       Assessment & Plan:  1. Left-sided low back pain without sciatica Urine with inflammation.   - POCT urinalysis dipstick  2. Malodorous urine As noted.  - POCT urinalysis dipstick  3. Acute cystitis with hematuria Will send for culture. No real symptoms. Prefers to wait on antibiotic. Will treat if develops urine symptoms. May clear on her own.  Knows to call if any symptoms develop, especially fever.   - Urine culture  4. Low back pain without sciatica, unspecified back pain laterality Will check lumbar spine Xray. Really concerned about bone cancer.  - DG Lumbar Spine Complete; Future  5. Arthritis Will check sed rate to rule out other etiology.  - Sed Rate (ESR)  6. Chronic lymphatic leukemia Does not have follow up scheduled yet. Will check labs.  - CBC with Differential/Platelet  7. HLD (hyperlipidemia) Due for lab recheck.  - Comprehensive metabolic panel - Lipid panel  8. Myalgia Will check magnesium level.  - Magnesium  Margarita Rana, MD

## 2015-03-08 NOTE — Telephone Encounter (Signed)
Pt advised as directed below.   Thanks,   -Laura  

## 2015-03-08 NOTE — Telephone Encounter (Signed)
LMTCB Emily Drozdowski, CMA  

## 2015-03-09 LAB — COMPREHENSIVE METABOLIC PANEL
ALBUMIN: 4.1 g/dL (ref 3.5–4.8)
ALT: 14 IU/L (ref 0–32)
AST: 20 IU/L (ref 0–40)
Albumin/Globulin Ratio: 1.9 (ref 1.1–2.5)
Alkaline Phosphatase: 101 IU/L (ref 39–117)
BILIRUBIN TOTAL: 0.4 mg/dL (ref 0.0–1.2)
BUN / CREAT RATIO: 13 (ref 11–26)
BUN: 9 mg/dL (ref 8–27)
CO2: 25 mmol/L (ref 18–29)
CREATININE: 0.72 mg/dL (ref 0.57–1.00)
Calcium: 9.2 mg/dL (ref 8.7–10.3)
Chloride: 99 mmol/L (ref 97–108)
GFR calc Af Amer: 93 mL/min/{1.73_m2} (ref >59)
GFR calc non Af Amer: 80 mL/min/{1.73_m2} (ref >59)
GLOBULIN, TOTAL: 2.2 g/dL (ref 1.5–4.5)
Glucose: 90 mg/dL (ref 65–99)
Potassium: 4.4 mmol/L (ref 3.5–5.2)
Sodium: 140 mmol/L (ref 134–144)
Total Protein: 6.3 g/dL (ref 6.0–8.5)

## 2015-03-09 LAB — CBC WITH DIFFERENTIAL/PLATELET
Basophils Absolute: 0 10*3/uL (ref 0.0–0.2)
Basos: 0 %
EOS (ABSOLUTE): 0.1 10*3/uL (ref 0.0–0.4)
Eos: 1 %
HEMATOCRIT: 40.4 % (ref 34.0–46.6)
Hemoglobin: 13.5 g/dL (ref 11.1–15.9)
Immature Grans (Abs): 0 10*3/uL (ref 0.0–0.1)
Immature Granulocytes: 0 %
Lymphocytes Absolute: 11.1 10*3/uL — ABNORMAL HIGH (ref 0.7–3.1)
Lymphs: 59 %
MCH: 27.9 pg (ref 26.6–33.0)
MCHC: 33.4 g/dL (ref 31.5–35.7)
MCV: 84 fL (ref 79–97)
MONOS ABS: 0.7 10*3/uL (ref 0.1–0.9)
Monocytes: 3 %
NEUTROS ABS: 7 10*3/uL (ref 1.4–7.0)
Neutrophils: 37 %
Platelets: 276 10*3/uL (ref 150–379)
RBC: 4.84 x10E6/uL (ref 3.77–5.28)
RDW: 13.6 % (ref 12.3–15.4)
WBC: 18.9 10*3/uL — ABNORMAL HIGH (ref 3.4–10.8)

## 2015-03-09 LAB — LIPID PANEL
CHOL/HDL RATIO: 3.7 ratio (ref 0.0–4.4)
Cholesterol, Total: 179 mg/dL (ref 100–199)
HDL: 48 mg/dL (ref >39)
LDL Calculated: 87 mg/dL (ref 0–99)
Triglycerides: 220 mg/dL — ABNORMAL HIGH (ref 0–149)
VLDL CHOLESTEROL CAL: 44 mg/dL — AB (ref 5–40)

## 2015-03-09 LAB — SEDIMENTATION RATE: Sed Rate: 2 mm/hr (ref 0–40)

## 2015-03-09 LAB — MAGNESIUM: Magnesium: 2.2 mg/dL (ref 1.6–2.3)

## 2015-03-10 LAB — URINE CULTURE

## 2015-03-11 ENCOUNTER — Telehealth: Payer: Self-pay

## 2015-03-11 DIAGNOSIS — N3090 Cystitis, unspecified without hematuria: Secondary | ICD-10-CM

## 2015-03-11 MED ORDER — CIPROFLOXACIN HCL 250 MG PO TABS: 250.0000 mg | ORAL_TABLET | Freq: Two times a day (BID) | ORAL | Status: DC

## 2015-03-11 NOTE — Telephone Encounter (Signed)
-----   Message from Margarita Rana, MD sent at 03/10/2015 10:16 AM EDT ----- Labs stable. White count about the same, slightly elevated form previous. Trending slightly up. Follow up with HemOnc as scheduled.  Inflammatory markers negative. Please see how patient is feeling. Thanks.

## 2015-03-11 NOTE — Telephone Encounter (Signed)
Patient reports that she does have some mild discomfort while urinating that has been going on for a few days. Other than that, patient feels fine. Would you like to send in a Rx for patient? Patient uses [facility] on [address]

## 2015-03-12 ENCOUNTER — Telehealth: Payer: Self-pay

## 2015-03-12 NOTE — Telephone Encounter (Signed)
Already sent in rx for Cipro to [facility] in [place] but can change pharmacies if patient would like. Thanks.

## 2015-03-12 NOTE — Telephone Encounter (Signed)
-----   Message from Margarita Rana, MD sent at 03/11/2015  9:34 AM EDT ----- Does have bacteria in urine.  Please see if having any symptoms. Thanks.

## 2015-03-12 NOTE — Telephone Encounter (Signed)
Left message with husband for her to call back.  Thanks,

## 2015-03-12 NOTE — Telephone Encounter (Signed)
Advised pt as directed below, pt stated that she is having some "stinging, like burning" when voiding, she also mentioned that she is going quiet frequently. She did say that she is drinking fluids,(water). And if something was to be called in, she would prefer the [facility] at [employer].   Thanks,

## 2015-03-12 NOTE — Telephone Encounter (Signed)
Advised pt as directed below, pt verbalized fully understanding. Pt also stated that it is ok with [facility] [place].    Thanks,

## 2015-05-07 ENCOUNTER — Other Ambulatory Visit: Payer: Self-pay | Admitting: Family Medicine

## 2015-05-07 DIAGNOSIS — E785 Hyperlipidemia, unspecified: Secondary | ICD-10-CM

## 2015-05-07 DIAGNOSIS — I10 Essential (primary) hypertension: Secondary | ICD-10-CM

## 2015-07-15 ENCOUNTER — Inpatient Hospital Stay: Payer: PPO | Attending: Oncology | Admitting: Oncology

## 2015-07-15 ENCOUNTER — Inpatient Hospital Stay: Payer: PPO

## 2015-07-15 VITALS — BP 149/80 | HR 81 | Temp 97.8°F | Resp 16 | Wt 137.3 lb

## 2015-07-15 DIAGNOSIS — I10 Essential (primary) hypertension: Secondary | ICD-10-CM | POA: Diagnosis not present

## 2015-07-15 DIAGNOSIS — E785 Hyperlipidemia, unspecified: Secondary | ICD-10-CM | POA: Insufficient documentation

## 2015-07-15 DIAGNOSIS — C9110 Chronic lymphocytic leukemia of B-cell type not having achieved remission: Secondary | ICD-10-CM

## 2015-07-15 DIAGNOSIS — Z79899 Other long term (current) drug therapy: Secondary | ICD-10-CM | POA: Diagnosis not present

## 2015-07-15 DIAGNOSIS — Z7982 Long term (current) use of aspirin: Secondary | ICD-10-CM | POA: Diagnosis not present

## 2015-07-15 DIAGNOSIS — R222 Localized swelling, mass and lump, trunk: Secondary | ICD-10-CM | POA: Insufficient documentation

## 2015-07-15 DIAGNOSIS — R531 Weakness: Secondary | ICD-10-CM | POA: Diagnosis not present

## 2015-07-15 LAB — CBC WITH DIFFERENTIAL/PLATELET
BASOS ABS: 0.3 10*3/uL — AB (ref 0–0.1)
BASOS PCT: 1 %
Eosinophils Absolute: 0.1 10*3/uL (ref 0–0.7)
Eosinophils Relative: 1 %
HEMATOCRIT: 40.8 % (ref 35.0–47.0)
HEMOGLOBIN: 13.2 g/dL (ref 12.0–16.0)
LYMPHS PCT: 69 %
Lymphs Abs: 13.4 10*3/uL — ABNORMAL HIGH (ref 1.0–3.6)
MCH: 26.5 pg (ref 26.0–34.0)
MCHC: 32.3 g/dL (ref 32.0–36.0)
MCV: 81.9 fL (ref 80.0–100.0)
MONO ABS: 0.5 10*3/uL (ref 0.2–0.9)
MONOS PCT: 2 %
NEUTROS PCT: 27 %
Neutro Abs: 5.2 10*3/uL (ref 1.4–6.5)
Platelets: 291 10*3/uL (ref 150–440)
RBC: 4.98 MIL/uL (ref 3.80–5.20)
RDW: 13.6 % (ref 11.5–14.5)
WBC: 19.5 10*3/uL — ABNORMAL HIGH (ref 3.6–11.0)

## 2015-07-15 NOTE — Progress Notes (Signed)
[place]  Telephone:(336) [phone]  Fax:(336) [phone]     Barbara Chambers DOB: December 20, 1934  MR#: [id number]  [id number]  Patient Care Team: Margarita Rana, MD as PCP - General (Family Medicine)  CHIEF COMPLAINT:  Chief Complaint  Patient presents with  . CLL    INTERVAL HISTORY:  Patient returns to clinic today for repeat laboratory work and further evaluation. She continues to feel fairly well with only some mild weakness. She has noted 1 very small lump under the left axilla. She reports that this sometimes goes away and then will recur. She denies any fevers, night sweats, or weight loss. She has no neurologic complaints. She denies any chest pain or shortness of breath. She denies any nausea, vomiting, constipation, or diarrhea. She has no urinary complaints. Patient feels at her baseline and offers no specific complaints today.   REVIEW OF SYSTEMS:   Review of Systems  Constitutional: Negative for fever, chills, weight loss, malaise/fatigue and diaphoresis.  HENT: Negative.   Eyes: Negative.   Respiratory: Negative for cough, hemoptysis, sputum production, shortness of breath and wheezing.   Cardiovascular: Negative for chest pain, palpitations, orthopnea, claudication, leg swelling and PND.  Gastrointestinal: Negative for heartburn, nausea, vomiting, abdominal pain, diarrhea, constipation, blood in stool and melena.  Genitourinary: Negative.   Musculoskeletal: Negative.   Skin: Negative.   Neurological: Positive for weakness. Negative for dizziness, tingling, focal weakness and seizures.  Endo/Heme/Allergies: Does not bruise/bleed easily.  Psychiatric/Behavioral: Negative for depression. The patient is not nervous/anxious and does not have insomnia.     As per HPI. Otherwise, a complete review of systems is negatve.  PAST MEDICAL HISTORY: Past Medical History  Diagnosis Date  . Hypertension   . Hyperlipidemia   . Cancer 2011    lymphoma    PAST  SURGICAL HISTORY: Past Surgical History  Procedure Laterality Date  . Cholecystectomy  1991  . Cataract surgery Bilateral 2005    FAMILY HISTORY Family History  Problem Relation Age of Onset  . Hypertension Mother   . Stroke Mother   . CAD Mother   . Hypertension Father   . Heart attack Father   . Hypertension Brother   . Seizures Brother     GYNECOLOGIC HISTORY:  No LMP recorded. Patient is postmenopausal.     ADVANCED DIRECTIVES:    HEALTH MAINTENANCE: Social History  Substance Use Topics  . Smoking status: Never Smoker   . Smokeless tobacco: Never Used  . Alcohol Use: No     Colonoscopy:  PAP:  Bone density:  Lipid panel:  Allergies  Allergen Reactions  . Penicillins     Current Outpatient Prescriptions  Medication Sig Dispense Refill  . Ascorbic Acid (VITAMIN C CR) 1000 MG TBCR Take by mouth.    [name] aspirin 81 MG tablet Take by mouth.    [name] COENZYME Q-10 PO Take by mouth.    [name] LORazepam (ATIVAN) 0.5 MG tablet Take by mouth.    . losartan-hydrochlorothiazide (HYZAAR) 50-12.5 MG tablet TAKE 1 TABLET EVERY DAY 90 tablet 1  . simvastatin (ZOCOR) 5 MG tablet TAKE 1 TABLET EVERY DAY AT BEDTIME 90 tablet 1  . ciprofloxacin (CIPRO) 250 MG tablet Take 1 tablet (250 mg total) by mouth 2 (two) times daily. (Patient not taking: Reported on 07/15/2015) 6 tablet 0   No current facility-administered medications for this visit.    OBJECTIVE: BP 149/80 mmHg  Pulse 81  Temp(Src) 97.8 F (36.6 C) (Tympanic)  Resp 16  Wt  137 lb 5.6 oz (62.3 kg)   Body mass index is 26.17 kg/(m^2).    ECOG FS:0 - Asymptomatic  General: Well-developed, well-nourished, no acute distress. Eyes: Pink conjunctiva, anicteric sclera. HEENT: Normocephalic, moist mucous membranes, clear oropharnyx. Lungs: Clear to auscultation bilaterally. Heart: Regular rate and rhythm. No rubs, murmurs, or gallops. Abdomen: Soft, nontender, nondistended. No organomegaly noted, normoactive bowel  sounds. Musculoskeletal: No edema, cyanosis, or clubbing. Neuro: Alert, answering all questions appropriately. Cranial nerves grossly intact. Skin: No rashes or petechiae noted. Psych: Normal affect. Lymphatics: 1 subcentimeter left axillary lymph node is palpable. No other palpable lymph nodes in the cervical, clavicular, or inguinal areas.   LAB RESULTS:  Appointment on 07/15/2015  Component Date Value Ref Range Status  . WBC 07/15/2015 19.5* 3.6 - 11.0 K/uL Final  . RBC 07/15/2015 4.98  3.80 - 5.20 MIL/uL Final  . Hemoglobin 07/15/2015 13.2  12.0 - 16.0 g/dL Final  . HCT 07/15/2015 40.8  35.0 - 47.0 % Final  . MCV 07/15/2015 81.9  80.0 - 100.0 fL Final  . MCH 07/15/2015 26.5  26.0 - 34.0 pg Final  . MCHC 07/15/2015 32.3  32.0 - 36.0 g/dL Final  . RDW 07/15/2015 13.6  11.5 - 14.5 % Final  . Platelets 07/15/2015 291  150 - 440 K/uL Final  . Neutrophils Relative % 07/15/2015 27   Final  . Neutro Abs 07/15/2015 5.2  1.4 - 6.5 K/uL Final  . Lymphocytes Relative 07/15/2015 69   Final  . Lymphs Abs 07/15/2015 13.4* 1.0 - 3.6 K/uL Final  . Monocytes Relative 07/15/2015 2   Final  . Monocytes Absolute 07/15/2015 0.5  0.2 - 0.9 K/uL Final  . Eosinophils Relative 07/15/2015 1   Final  . Eosinophils Absolute 07/15/2015 0.1  0 - 0.7 K/uL Final  . Basophils Relative 07/15/2015 1   Final  . Basophils Absolute 07/15/2015 0.3* 0 - 0.1 K/uL Final    STUDIES: No results found.  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  PLAN:   1. CLL variant. Patient's white blood cell count is mildly elevated today at 19.5 but overall stable and unchanged. Small subcentimeter left axillary lymph node is palpable but no other palpable lymph nodes present. Discussed with Dr. [name] he recommended just to follow up in 4 months. Patient was agreeable with this.  We'll continue with routine evaluation in 4 months.  Patient expressed understanding and was in  agreement with this plan. She also understands that She can call clinic at any time with any questions, concerns, or complaints.   Dr. Oliva Bustard was available for consultation and review of plan of care for this patient.   Evlyn Kanner, NP   07/15/2015 4:19 PM

## 2015-07-15 NOTE — Progress Notes (Signed)
Patient has hip pain that was evaluated by PCP and diagnosed as arthritis.

## 2015-08-12 DIAGNOSIS — Z85828 Personal history of other malignant neoplasm of skin: Secondary | ICD-10-CM | POA: Diagnosis not present

## 2015-08-12 DIAGNOSIS — L570 Actinic keratosis: Secondary | ICD-10-CM | POA: Diagnosis not present

## 2015-08-12 DIAGNOSIS — L578 Other skin changes due to chronic exposure to nonionizing radiation: Secondary | ICD-10-CM | POA: Diagnosis not present

## 2015-08-12 DIAGNOSIS — L820 Inflamed seborrheic keratosis: Secondary | ICD-10-CM | POA: Diagnosis not present

## 2015-09-25 ENCOUNTER — Telehealth: Payer: Self-pay | Admitting: Family Medicine

## 2015-09-25 MED ORDER — LORAZEPAM 0.5 MG PO TABS: 0.5000 mg | ORAL_TABLET | Freq: Three times a day (TID) | ORAL | Status: DC

## 2015-09-25 NOTE — Telephone Encounter (Signed)
Pt's husband is at [facility] for bypass and is not doing very well.  He has been there 10 days now and her nerves are bad.  She wants to know if someone will call her in something for anxiety to [place] on [address].  Her call back is [phone]  Thanks [name]

## 2015-09-25 NOTE — Telephone Encounter (Signed)
Please phone in to [facility] and notify patient Thanks.

## 2015-09-26 NOTE — Telephone Encounter (Signed)
Called in rx and informed pt. Renaldo Fiddler, CMA

## 2015-10-03 ENCOUNTER — Other Ambulatory Visit: Payer: Self-pay | Admitting: Family Medicine

## 2015-10-03 DIAGNOSIS — E785 Hyperlipidemia, unspecified: Secondary | ICD-10-CM

## 2015-10-03 DIAGNOSIS — I10 Essential (primary) hypertension: Secondary | ICD-10-CM

## 2015-10-03 MED ORDER — SIMVASTATIN 5 MG PO TABS: ORAL_TABLET | ORAL | Status: DC

## 2015-10-03 MED ORDER — LOSARTAN POTASSIUM-HCTZ 50-12.5 MG PO TABS: 1.0000 | ORAL_TABLET | Freq: Every day | ORAL | Status: DC

## 2015-10-03 NOTE — Telephone Encounter (Signed)
Pt's pharmacy contacted office for refill request on the following medications: 1. losartan-hydrochlorothiazide (HYZAAR) 50-12.5 MG tablet  2. simvastatin (ZOCOR) 5 MG tablet  Pharmacy advised that pt requested [facility] [address] contact us for the refill b/c pt usually gets them from mail order but b/c her husband is in the hospital she is wanting them sent to [place]. Please advise. Thanks TNP

## 2015-10-07 ENCOUNTER — Other Ambulatory Visit: Payer: Self-pay | Admitting: Family Medicine

## 2015-10-07 DIAGNOSIS — E785 Hyperlipidemia, unspecified: Secondary | ICD-10-CM

## 2015-10-07 DIAGNOSIS — I10 Essential (primary) hypertension: Secondary | ICD-10-CM

## 2015-10-07 MED ORDER — LOSARTAN POTASSIUM-HCTZ 50-12.5 MG PO TABS: 1.0000 | ORAL_TABLET | Freq: Every day | ORAL | Status: DC

## 2015-10-07 MED ORDER — SIMVASTATIN 5 MG PO TABS: ORAL_TABLET | ORAL | Status: DC

## 2015-10-07 NOTE — Telephone Encounter (Signed)
Done-aa 

## 2015-10-07 NOTE — Telephone Encounter (Signed)
Pt contacted office for refill request on the following medications:  Pt is requesting these reset to the correct pharmacy.  [facility]  CB#[id number]/MW  losartan-hydrochlorothiazide (HYZAAR) 50-12.5 MG tablet  simvastatin (ZOCOR) 5 MG tablet

## 2015-10-21 DIAGNOSIS — E782 Mixed hyperlipidemia: Secondary | ICD-10-CM | POA: Diagnosis not present

## 2015-10-21 DIAGNOSIS — I10 Essential (primary) hypertension: Secondary | ICD-10-CM | POA: Diagnosis not present

## 2015-11-12 ENCOUNTER — Inpatient Hospital Stay: Payer: PPO | Attending: Oncology

## 2015-11-12 ENCOUNTER — Inpatient Hospital Stay (HOSPITAL_BASED_OUTPATIENT_CLINIC_OR_DEPARTMENT_OTHER): Payer: PPO | Admitting: Oncology

## 2015-11-12 VITALS — BP 135/80 | HR 86 | Resp 18 | Wt 135.6 lb

## 2015-11-12 DIAGNOSIS — Z79899 Other long term (current) drug therapy: Secondary | ICD-10-CM | POA: Insufficient documentation

## 2015-11-12 DIAGNOSIS — I10 Essential (primary) hypertension: Secondary | ICD-10-CM | POA: Insufficient documentation

## 2015-11-12 DIAGNOSIS — C9110 Chronic lymphocytic leukemia of B-cell type not having achieved remission: Secondary | ICD-10-CM | POA: Insufficient documentation

## 2015-11-12 DIAGNOSIS — Z7982 Long term (current) use of aspirin: Secondary | ICD-10-CM

## 2015-11-12 DIAGNOSIS — E785 Hyperlipidemia, unspecified: Secondary | ICD-10-CM | POA: Insufficient documentation

## 2015-11-12 LAB — CBC WITH DIFFERENTIAL/PLATELET
Basophils Absolute: 0.1 10*3/uL (ref 0–0.1)
Basophils Relative: 1 %
Eosinophils Absolute: 0.1 10*3/uL (ref 0–0.7)
Eosinophils Relative: 1 %
HEMATOCRIT: 40 % (ref 35.0–47.0)
Hemoglobin: 13.4 g/dL (ref 12.0–16.0)
LYMPHS PCT: 66 %
Lymphs Abs: 12.5 10*3/uL — ABNORMAL HIGH (ref 1.0–3.6)
MCH: 27.7 pg (ref 26.0–34.0)
MCHC: 33.4 g/dL (ref 32.0–36.0)
MCV: 83 fL (ref 80.0–100.0)
MONO ABS: 0.7 10*3/uL (ref 0.2–0.9)
MONOS PCT: 4 %
NEUTROS ABS: 5.2 10*3/uL (ref 1.4–6.5)
Neutrophils Relative %: 28 %
Platelets: 288 10*3/uL (ref 150–440)
RBC: 4.82 MIL/uL (ref 3.80–5.20)
RDW: 13.9 % (ref 11.5–14.5)
WBC: 18.7 10*3/uL — ABNORMAL HIGH (ref 3.6–11.0)

## 2015-11-12 LAB — LACTATE DEHYDROGENASE: LDH: 173 U/L (ref 98–192)

## 2015-11-12 NOTE — Progress Notes (Signed)
Patient does not offer any problems today.  

## 2015-11-17 NOTE — Progress Notes (Signed)
[place]  Telephone:(336) [phone]  Fax:(336) [phone]     Barbara Chambers DOB: 06-14-1935  MR#: [id number]  FGH#:[id number]  Patient Care Team: Margarita Rana, MD as PCP - General (Family Medicine)  CHIEF COMPLAINT:  Chief Complaint  Patient presents with  . CLL    INTERVAL HISTORY:  Patient returns to clinic today for repeat laboratory work and further evaluation. She continues to feel well and remains asymptomatic. She denies any fevers, night sweats, or weight loss. She has no neurologic complaints. She denies any chest pain or shortness of breath. She denies any nausea, vomiting, constipation, or diarrhea. She has no urinary complaints. Patient feels at her baseline and offers no specific complaints today.   REVIEW OF SYSTEMS:   Review of Systems  Constitutional: Negative.  Negative for fever, weight loss, malaise/fatigue and diaphoresis.  Respiratory: Negative.  Negative for cough and shortness of breath.   Cardiovascular: Negative.  Negative for chest pain.  Gastrointestinal: Negative.   Genitourinary: Negative.   Musculoskeletal: Negative.   Neurological: Negative.  Negative for weakness.  Psychiatric/Behavioral: Negative.     As per HPI. Otherwise, a complete review of systems is negatve.  PAST MEDICAL HISTORY: Past Medical History  Diagnosis Date  . Hypertension   . Hyperlipidemia   . Cancer 2011    lymphoma    PAST SURGICAL HISTORY: Past Surgical History  Procedure Laterality Date  . Cholecystectomy  1991  . Cataract surgery Bilateral 2005    FAMILY HISTORY Family History  Problem Relation Age of Onset  . Hypertension Mother   . Stroke Mother   . CAD Mother   . Hypertension Father   . Heart attack Father   . Hypertension Brother   . Seizures Brother     GYNECOLOGIC HISTORY:  No LMP recorded. Patient is postmenopausal.     ADVANCED DIRECTIVES:    HEALTH MAINTENANCE: Social History  Substance Use Topics  . Smoking status:  Never Smoker   . Smokeless tobacco: Never Used  . Alcohol Use: No     Colonoscopy:  PAP:  Bone density:  Lipid panel:  Allergies  Allergen Reactions  . Penicillins     Current Outpatient Prescriptions  Medication Sig Dispense Refill  . Ascorbic Acid (VITAMIN C CR) 1000 MG TBCR Take by mouth.    [name] aspirin 81 MG tablet Take by mouth.    [name] COENZYME Q-10 PO Take by mouth.    [name] LORazepam (ATIVAN) 0.5 MG tablet Take 1 tablet (0.5 mg total) by mouth every 8 (eight) hours. 60 tablet 0  . losartan-hydrochlorothiazide (HYZAAR) 50-12.5 MG tablet Take 1 tablet by mouth daily. 90 tablet 1  . simvastatin (ZOCOR) 5 MG tablet TAKE 1 TABLET EVERY DAY AT BEDTIME 90 tablet 1   No current facility-administered medications for this visit.    OBJECTIVE: BP 135/80 mmHg  Pulse 86  Resp 18  Wt 135 lb 9.3 oz (61.5 kg)   Body mass index is 25.83 kg/(m^2).    ECOG FS:0 - Asymptomatic  General: Well-developed, well-nourished, no acute distress. Eyes: Pink conjunctiva, anicteric sclera. Lungs: Clear to auscultation bilaterally. Heart: Regular rate and rhythm. No rubs, murmurs, or gallops. Abdomen: Soft, nontender, nondistended. No organomegaly noted, normoactive bowel sounds. Musculoskeletal: No edema, cyanosis, or clubbing. Neuro: Alert, answering all questions appropriately. Cranial nerves grossly intact. Skin: No rashes or petechiae noted. Psych: Normal affect. Lymphatics: No other palpable lymphadenopathy in cervical, clavicular, or inguinal areas.   LAB RESULTS:  Appointment on 11/12/2015  Component Date Value Ref Range Status  . WBC 11/12/2015 18.7* 3.6 - 11.0 K/uL Final  . RBC 11/12/2015 4.82  3.80 - 5.20 MIL/uL Final  . Hemoglobin 11/12/2015 13.4  12.0 - 16.0 g/dL Final  . HCT 11/12/2015 40.0  35.0 - 47.0 % Final  . MCV 11/12/2015 83.0  80.0 - 100.0 fL Final  . MCH 11/12/2015 27.7  26.0 - 34.0 pg Final  . MCHC 11/12/2015 33.4  32.0 - 36.0 g/dL Final  . RDW 11/12/2015 13.9  11.5 -  14.5 % Final  . Platelets 11/12/2015 288  150 - 440 K/uL Final  . Neutrophils Relative % 11/12/2015 28   Final  . Neutro Abs 11/12/2015 5.2  1.4 - 6.5 K/uL Final  . Lymphocytes Relative 11/12/2015 66   Final  . Lymphs Abs 11/12/2015 12.5* 1.0 - 3.6 K/uL Final  . Monocytes Relative 11/12/2015 4   Final  . Monocytes Absolute 11/12/2015 0.7  0.2 - 0.9 K/uL Final  . Eosinophils Relative 11/12/2015 1   Final  . Eosinophils Absolute 11/12/2015 0.1  0 - 0.7 K/uL Final  . Basophils Relative 11/12/2015 1   Final  . Basophils Absolute 11/12/2015 0.1  0 - 0.1 K/uL Final  . LDH 11/12/2015 173  98 - 192 U/L Final    STUDIES: No results found.  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  PLAN:    1. CLL variant: Patient's white blood cell count is mildly elevated but overall stable and unchanged. No intervention is needed at this time. Patient has no other cytopenias her symptoms. A bone marrow biopsy is not necessary. Can consider imaging in the future, but this is not necessary at this point either. Return to clinic in 6 months with repeat laboratory work and further evaluation.  Patient expressed understanding and was in agreement with this plan. She also understands that She can call clinic at any time with any questions, concerns, or complaints.    Lloyd Huger, MD   11/17/2015 9:20 AM

## 2015-12-17 DIAGNOSIS — L239 Allergic contact dermatitis, unspecified cause: Secondary | ICD-10-CM | POA: Diagnosis not present

## 2015-12-17 DIAGNOSIS — L814 Other melanin hyperpigmentation: Secondary | ICD-10-CM | POA: Diagnosis not present

## 2015-12-17 DIAGNOSIS — I8393 Asymptomatic varicose veins of bilateral lower extremities: Secondary | ICD-10-CM | POA: Diagnosis not present

## 2015-12-17 DIAGNOSIS — L570 Actinic keratosis: Secondary | ICD-10-CM | POA: Diagnosis not present

## 2015-12-17 DIAGNOSIS — Z85828 Personal history of other malignant neoplasm of skin: Secondary | ICD-10-CM | POA: Diagnosis not present

## 2015-12-17 DIAGNOSIS — L821 Other seborrheic keratosis: Secondary | ICD-10-CM | POA: Diagnosis not present

## 2015-12-17 DIAGNOSIS — L820 Inflamed seborrheic keratosis: Secondary | ICD-10-CM | POA: Diagnosis not present

## 2015-12-17 DIAGNOSIS — L578 Other skin changes due to chronic exposure to nonionizing radiation: Secondary | ICD-10-CM | POA: Diagnosis not present

## 2015-12-17 DIAGNOSIS — Z1283 Encounter for screening for malignant neoplasm of skin: Secondary | ICD-10-CM | POA: Diagnosis not present

## 2015-12-17 DIAGNOSIS — D1800 Hemangioma unspecified site: Secondary | ICD-10-CM | POA: Diagnosis not present

## 2016-02-10 ENCOUNTER — Other Ambulatory Visit: Payer: Self-pay | Admitting: Oncology

## 2016-02-10 DIAGNOSIS — Z1231 Encounter for screening mammogram for malignant neoplasm of breast: Secondary | ICD-10-CM

## 2016-03-03 ENCOUNTER — Ambulatory Visit (INDEPENDENT_AMBULATORY_CARE_PROVIDER_SITE_OTHER): Payer: PPO | Admitting: Family Medicine

## 2016-03-03 VITALS — BP 132/70 | HR 92 | Temp 98.1°F | Wt 134.0 lb

## 2016-03-03 DIAGNOSIS — J320 Chronic maxillary sinusitis: Secondary | ICD-10-CM

## 2016-03-03 DIAGNOSIS — R059 Cough, unspecified: Secondary | ICD-10-CM

## 2016-03-03 DIAGNOSIS — R05 Cough: Secondary | ICD-10-CM | POA: Diagnosis not present

## 2016-03-03 MED ORDER — HYDROCODONE-HOMATROPINE 5-1.5 MG/5ML PO SYRP: 5.0000 mL | ORAL_SOLUTION | Freq: Three times a day (TID) | ORAL | 0 refills | Status: DC | PRN

## 2016-03-03 MED ORDER — DOXYCYCLINE HYCLATE 100 MG PO TABS: 100.0000 mg | ORAL_TABLET | Freq: Two times a day (BID) | ORAL | 0 refills | Status: DC

## 2016-03-03 NOTE — Progress Notes (Signed)
Barbara Chambers  MRN: [id number] DOB: [birth year]  Subjective:  HPI  The patient is a 81 year old female who presents for evaluation of sinus congestion and cough.  She began having hoarseness about 1 week ago and then it progressed into sinus congestion and non productive cough.  Her cough is mostly at night and it is keeping her awake.  She denies fever but states she has been feeling hot.    Patient Active Problem List   Diagnosis Date Noted  . Low back pain 03/08/2015  . Arthritis 03/08/2015  . Anxiety 10/25/2014  . Chronic lymphatic leukemia ([place]) 10/25/2014  . HLD (hyperlipidemia) 10/25/2014  . BP (high blood pressure) 10/25/2014  . Subclinical hypothyroidism 10/25/2014  . Abnormal ECG 10/11/2014  . Benign essential HTN 10/11/2014    Past Medical History:  Diagnosis Date  . Cancer 2011   lymphoma  . Hyperlipidemia   . Hypertension     Social History   Social History  . Marital status: Widowed    Spouse name: Casandra Doffing  . Number of children: 3  . Years of education: N/A   Occupational History  . retired    Social History Main Topics  . Smoking status: Never Smoker  . Smokeless tobacco: Never Used  . Alcohol use No  . Drug use: No  . Sexual activity: Not on file   Other Topics Concern  . Not on file   Social History Narrative  . No narrative on file    Outpatient Encounter Prescriptions as of 03/03/2016  Medication Sig Note  . Ascorbic Acid (VITAMIN C CR) 1000 MG TBCR Take by mouth. 10/25/2014: Received from: [facility]  . aspirin 81 MG tablet Take by mouth. 10/25/2014: Received from: [facility]  . COENZYME Q-10 PO Take by mouth. 10/25/2014: Received from: [facility]  . LORazepam (ATIVAN) 0.5 MG tablet Take 1 tablet (0.5 mg total) by mouth every 8 (eight) hours.   [name] losartan-hydrochlorothiazide (HYZAAR) 50-12.5 MG tablet Take 1 tablet by mouth daily.   . simvastatin (ZOCOR) 5 MG tablet TAKE 1 TABLET  EVERY DAY AT BEDTIME    No facility-administered encounter medications on file as of 03/03/2016.     Allergies  Allergen Reactions  . Penicillins     Review of Systems  Constitutional: Positive for malaise/fatigue. Negative for chills, fever and weight loss.  HENT: Positive for congestion and sore throat (in the morning from her throat being dry). Negative for ear discharge, ear pain, hearing loss, nosebleeds and tinnitus.   Eyes: Negative for blurred vision, double vision, photophobia, pain, discharge and redness.  Respiratory: Positive for cough. Negative for hemoptysis, sputum production, shortness of breath and wheezing.   Cardiovascular: Negative for chest pain, palpitations, orthopnea and leg swelling.  Neurological: Positive for weakness and headaches.   Objective:  BP 132/70   Pulse 92   Temp 98.1 F (36.7 C) (Oral)   Wt 134 lb (60.8 kg)   BMI 25.53 kg/m   Physical Exam  Constitutional: She is well-developed, well-nourished, and in no distress.  HENT:  Head: Normocephalic and atraumatic.  Right Ear: External ear normal.  Left Ear: External ear normal.  Mouth/Throat: Oropharynx is clear and moist.  Maxillary sinus tenderness   Eyes: Conjunctivae are normal. Pupils are equal, round, and reactive to light.  Neck: Normal range of motion. Neck supple.  Small anterior cervical adenopathy  Cardiovascular: Normal rate, regular rhythm, normal heart sounds and intact distal pulses.   Pulmonary/Chest:  Effort normal and breath sounds normal.  Skin: Skin is warm and dry.  Psychiatric: Mood, memory, affect and judgment normal.    Assessment and Plan :  1. Maxillary sinusitis, unspecified chronicity  - doxycycline (VIBRA-TABS) 100 MG tablet; Take 1 tablet (100 mg total) by mouth 2 (two) times daily.  Dispense: 20 tablet; Refill: 0  2. Cough  - HYDROcodone-homatropine (HYCODAN) 5-1.5 MG/5ML syrup; Take 5 mLs by mouth every 8 (eight) hours as needed for cough.  Dispense: 120  mL; Refill: 0   HPI, Exam and A&P Transcribed under the direction and in the presence of Wilhemena Durie., MD. Electronically Signed: Althea Charon, RMA I have done the exam and reviewed the above chart and it is accurate to the best of my knowledge.

## 2016-03-10 ENCOUNTER — Other Ambulatory Visit: Payer: Self-pay | Admitting: Oncology

## 2016-03-10 ENCOUNTER — Ambulatory Visit
Admission: RE | Admit: 2016-03-10 | Discharge: 2016-03-10 | Disposition: A | Discharge status: Home / Self Care | Payer: PPO | Source: Ambulatory Visit | Attending: Oncology | Admitting: Oncology

## 2016-03-10 DIAGNOSIS — Z1231 Encounter for screening mammogram for malignant neoplasm of breast: Secondary | ICD-10-CM | POA: Insufficient documentation

## 2016-04-07 ENCOUNTER — Encounter: Payer: Self-pay | Admitting: Physician Assistant

## 2016-04-07 ENCOUNTER — Ambulatory Visit (INDEPENDENT_AMBULATORY_CARE_PROVIDER_SITE_OTHER): Payer: PPO | Admitting: Physician Assistant

## 2016-04-07 VITALS — BP 124/62 | HR 80 | Temp 98.6°F | Resp 16 | Wt 135.0 lb

## 2016-04-07 DIAGNOSIS — J302 Other seasonal allergic rhinitis: Secondary | ICD-10-CM

## 2016-04-07 DIAGNOSIS — R252 Cramp and spasm: Secondary | ICD-10-CM | POA: Diagnosis not present

## 2016-04-07 DIAGNOSIS — M25471 Effusion, right ankle: Secondary | ICD-10-CM | POA: Diagnosis not present

## 2016-04-07 DIAGNOSIS — R42 Dizziness and giddiness: Secondary | ICD-10-CM

## 2016-04-07 DIAGNOSIS — M25472 Effusion, left ankle: Secondary | ICD-10-CM

## 2016-04-07 NOTE — Patient Instructions (Signed)
Suggest: Claritin, Zyrtec    Allergic Rhinitis Allergic rhinitis is when the mucous membranes in the nose respond to allergens. Allergens are particles in the air that cause your body to have an allergic reaction. This causes you to release allergic antibodies. Through a chain of events, these eventually cause you to release histamine into the blood stream. Although meant to protect the body, it is this release of histamine that causes your discomfort, such as frequent sneezing, congestion, and an itchy, runny nose.  CAUSES Seasonal allergic rhinitis (hay fever) is caused by pollen allergens that may come from grasses, trees, and weeds. Year-round allergic rhinitis (perennial allergic rhinitis) is caused by allergens such as house dust mites, pet dander, and mold spores. SYMPTOMS  Nasal stuffiness (congestion).  Itchy, runny nose with sneezing and tearing of the eyes. DIAGNOSIS Your health care provider can help you determine the allergen or allergens that trigger your symptoms. If you and your health care provider are unable to determine the allergen, skin or blood testing may be used. Your health care provider will diagnose your condition after taking your health history and performing a physical exam. Your health care provider may assess you for other related conditions, such as asthma, pink eye, or an ear infection. TREATMENT Allergic rhinitis does not have a cure, but it can be controlled by:  Medicines that block allergy symptoms. These may include allergy shots, nasal sprays, and oral antihistamines.  Avoiding the allergen. Hay fever may often be treated with antihistamines in pill or nasal spray forms. Antihistamines block the effects of histamine. There are over-the-counter medicines that may help with nasal congestion and swelling around the eyes. Check with your health care provider before taking or giving this medicine. If avoiding the allergen or the medicine prescribed do not work,  there are many new medicines your health care provider can prescribe. Stronger medicine may be used if initial measures are ineffective. Desensitizing injections can be used if medicine and avoidance does not work. Desensitization is when a patient is given ongoing shots until the body becomes less sensitive to the allergen. Make sure you follow up with your health care provider if problems continue. HOME CARE INSTRUCTIONS It is not possible to completely avoid allergens, but you can reduce your symptoms by taking steps to limit your exposure to them. It helps to know exactly what you are allergic to so that you can avoid your specific triggers. SEEK MEDICAL CARE IF:  You have a fever.  You develop a cough that does not stop easily (persistent).  You have shortness of breath.  You start wheezing.  Symptoms interfere with normal daily activities.   This information is not intended to replace advice given to you by your health care provider. Make sure you discuss any questions you have with your health care provider.   Document Released: 03/10/2001 Document Revised: 07/06/2014 Document Reviewed: 02/20/2013 Elsevier Interactive Patient Education [employer].

## 2016-04-07 NOTE — Progress Notes (Signed)
Patient: Barbara Chambers Female    DOB: [birth year]   80 y.o.   MRN: [id number] Visit Date: 04/07/2016  Today's Provider: Trinna Post, PA-C   Chief Complaint  Patient presents with  . Sinusitis   Subjective:    Sinusitis  The current episode started more than 1 month ago. The problem has been gradually worsening since onset. There has been no fever. Associated symptoms include congestion, coughing, diaphoresis, ear pain, headaches, sinus pressure and a sore throat. Pertinent negatives include no chills, shortness of breath or sneezing. Past treatments include antibiotics. The treatment provided no relief.    Patient seen in clinic one month ago and given doxycycline for sinusitis, which patient reports did not help. Patient reports continuation of symptoms as well as itchy, watery eyes, rhinorrhea, and ear pressure.  Dizziness  Patient states she is dizzy when she gets up out of bed and has to stagger around until she gets her bearings. Patient states it is the worst when she first gets up. Denies head trauma, headache, vision change. Patient denies association of dizziness with positional changes or head movement. Denies nausea or vomiting.  Ankle Edema & Muscle Cramps  Patient reports some edema in bilateral feet. Denies pain in legs. Has not tried rest or elevation. She also reports some muscle cramps in her calves.  Patient's husband of 13 years died in 01-11-2016. She declines counseling referral today. Says she lives alone but has her adult sons checking on her.    Allergies  Allergen Reactions  . Penicillins      Current Outpatient Prescriptions:  .  Ascorbic Acid (VITAMIN C CR) 1000 MG TBCR, Take by mouth., Disp: , Rfl:  .  aspirin 81 MG tablet, Take by mouth., Disp: , Rfl:  .  COENZYME Q-10 PO, Take by mouth., Disp: , Rfl:  .  LORazepam (ATIVAN) 0.5 MG tablet, Take 1 tablet (0.5 mg total) by mouth every 8 (eight) hours., Disp: 60 tablet, Rfl: 0 .   losartan-hydrochlorothiazide (HYZAAR) 50-12.5 MG tablet, Take 1 tablet by mouth daily., Disp: 90 tablet, Rfl: 1 .  simvastatin (ZOCOR) 5 MG tablet, TAKE 1 TABLET EVERY DAY AT BEDTIME, Disp: 90 tablet, Rfl: 1  Review of Systems  Constitutional: Positive for diaphoresis and fatigue. Negative for activity change, appetite change, chills, fever and unexpected weight change.  HENT: Positive for congestion, ear pain, postnasal drip, rhinorrhea, sinus pressure and sore throat. Negative for ear discharge, nosebleeds, sneezing, tinnitus and trouble swallowing.   Eyes: Positive for discharge. Negative for photophobia, pain, redness, itching and visual disturbance.  Respiratory: Positive for cough. Negative for apnea, choking, chest tightness, shortness of breath, wheezing and stridor.   Cardiovascular: Positive for leg swelling (Bilateral feet swelling). Negative for chest pain and palpitations.  Gastrointestinal: Positive for nausea. Negative for abdominal distention, abdominal pain, anal bleeding, blood in stool, constipation, diarrhea, rectal pain and vomiting.  Musculoskeletal: Negative for myalgias.  Neurological: Positive for light-headedness and headaches. Negative for dizziness and syncope.    Social History  Substance Use Topics  . Smoking status: Never Smoker  . Smokeless tobacco: Never Used  . Alcohol use No   Objective:   BP 124/62 (BP Location: Left Arm, Patient Position: Sitting, Cuff Size: Normal)   Pulse 80   Temp 98.6 F (37 C) (Oral)   Resp 16   Wt 135 lb (61.2 kg)   BMI 25.72 kg/m   Orthostatics: 124/60, P74 supine; 124/64, P76 sitting,  124/62, P92 standing  Physical Exam  Constitutional: She is oriented to person, place, and time. She appears well-developed and well-nourished. No distress.  HENT:  Right Ear: External ear normal. No drainage.  Left Ear: External ear normal. No drainage.  Nose: Rhinorrhea present.  Mouth/Throat: Oropharynx is clear and moist. No  oropharyngeal exudate.  Eyes: Right eye exhibits discharge. Left eye exhibits discharge.    Neck: Normal range of motion. Neck supple.  Cardiovascular: Normal rate, regular rhythm and normal heart sounds.   Pulmonary/Chest: Effort normal and breath sounds normal. No respiratory distress. She has no wheezes. She has no rales.  Musculoskeletal: She exhibits edema.       Right knee: Normal.       Left knee: Normal.       Right ankle: She exhibits no deformity.       Right lower leg: Normal.       Left lower leg: Normal.       Feet:  Lymphadenopathy:    She has no cervical adenopathy.  Neurological: She is alert and oriented to person, place, and time.  Skin: Skin is warm and dry. She is not diaphoretic.  Psychiatric: She has a normal mood and affect. Her behavior is normal.        Assessment & Plan:      Problem List Items Addressed This Visit    None    Visit Diagnoses    Seasonal allergic rhinitis, unspecified chronicity, unspecified trigger    -  Primary   Ankle edema, bilateral       Dizziness       Muscle cramps         Seasonal Allergic Rhinitis Patient may take 2nd generation antihistamine of her choice. If this doesn't improve, we may proceed to steroid nasal spray.  Dizziness Orthostatics performed in office. Not orthostatic in office. Patient is on blood pressure medication Hyzaar, with some diuretic effect. Additionally patient has pedal edema. Advised patient to wear compression stockings and push fluids. Patient reports she doesn't like wearing compression stockings. Counseled patient on elevating feet at end of day and getting up slowly from supine position by first sitting and then standing. Will check CBC, CMP. Read Cardiology last note w/ Dr. Arnetha Gula who reported some LVH and valve dysfunction on an echo that is not available to me today, but no mention of heart failure on last note. Patient has appointment with him in January that she should keep.  Muscle  Cramps Ordered mag and CMP. Will advise patients with results.  I spent 25 minutes with patient, >50% of which was spent on counseling and coordination of care.  The entirety of the information documented in the History of Present Illness, Review of Systems and Physical Exam were personally obtained by me. Portions of this information were initially documented by Ashley Royalty, CMA and reviewed by me for thoroughness and accuracy.   Patient Instructions  Suggest: Claritin, Zyrtec    Allergic Rhinitis Allergic rhinitis is when the mucous membranes in the nose respond to allergens. Allergens are particles in the air that cause your body to have an allergic reaction. This causes you to release allergic antibodies. Through a chain of events, these eventually cause you to release histamine into the blood stream. Although meant to protect the body, it is this release of histamine that causes your discomfort, such as frequent sneezing, congestion, and an itchy, runny nose.  CAUSES Seasonal allergic rhinitis (hay fever) is caused by pollen allergens  that may come from grasses, trees, and weeds. Year-round allergic rhinitis (perennial allergic rhinitis) is caused by allergens such as house dust mites, pet dander, and mold spores. SYMPTOMS  Nasal stuffiness (congestion).  Itchy, runny nose with sneezing and tearing of the eyes. DIAGNOSIS Your health care provider can help you determine the allergen or allergens that trigger your symptoms. If you and your health care provider are unable to determine the allergen, skin or blood testing may be used. Your health care provider will diagnose your condition after taking your health history and performing a physical exam. Your health care provider may assess you for other related conditions, such as asthma, pink eye, or an ear infection. TREATMENT Allergic rhinitis does not have a cure, but it can be controlled by:  Medicines that block allergy symptoms. These  may include allergy shots, nasal sprays, and oral antihistamines.  Avoiding the allergen. Hay fever may often be treated with antihistamines in pill or nasal spray forms. Antihistamines block the effects of histamine. There are over-the-counter medicines that may help with nasal congestion and swelling around the eyes. Check with your health care provider before taking or giving this medicine. If avoiding the allergen or the medicine prescribed do not work, there are many new medicines your health care provider can prescribe. Stronger medicine may be used if initial measures are ineffective. Desensitizing injections can be used if medicine and avoidance does not work. Desensitization is when a patient is given ongoing shots until the body becomes less sensitive to the allergen. Make sure you follow up with your health care provider if problems continue. HOME CARE INSTRUCTIONS It is not possible to completely avoid allergens, but you can reduce your symptoms by taking steps to limit your exposure to them. It helps to know exactly what you are allergic to so that you can avoid your specific triggers. SEEK MEDICAL CARE IF:  You have a fever.  You develop a cough that does not stop easily (persistent).  You have shortness of breath.  You start wheezing.  Symptoms interfere with normal daily activities.   This information is not intended to replace advice given to you by your health care provider. Make sure you discuss any questions you have with your health care provider.   Document Released: 03/10/2001 Document Revised: 07/06/2014 Document Reviewed: 02/20/2013 Elsevier Interactive Patient Education 2016 [place]

## 2016-04-08 ENCOUNTER — Telehealth: Payer: Self-pay

## 2016-04-08 LAB — COMPREHENSIVE METABOLIC PANEL
ALT: 15 IU/L (ref 0–32)
AST: 24 IU/L (ref 0–40)
Albumin/Globulin Ratio: 2 (ref 1.2–2.2)
Albumin: 4.3 g/dL (ref 3.5–4.8)
Alkaline Phosphatase: 88 IU/L (ref 39–117)
BUN/Creatinine Ratio: 17 (ref 12–28)
BUN: 13 mg/dL (ref 8–27)
Bilirubin Total: 0.3 mg/dL (ref 0.0–1.2)
CO2: 25 mmol/L (ref 18–29)
Calcium: 9.4 mg/dL (ref 8.7–10.3)
Chloride: 96 mmol/L (ref 96–106)
Creatinine, Ser: 0.78 mg/dL (ref 0.57–1.00)
GFR calc Af Amer: 84 mL/min/{1.73_m2} (ref >59)
GFR calc non Af Amer: 73 mL/min/{1.73_m2} (ref >59)
Globulin, Total: 2.2 g/dL (ref 1.5–4.5)
Glucose: 110 mg/dL — ABNORMAL HIGH (ref 65–99)
Potassium: 4.4 mmol/L (ref 3.5–5.2)
Sodium: 138 mmol/L (ref 134–144)
Total Protein: 6.5 g/dL (ref 6.0–8.5)

## 2016-04-08 LAB — MAGNESIUM: Magnesium: 2.2 mg/dL (ref 1.6–2.3)

## 2016-04-08 LAB — CBC
Hematocrit: 38.2 % (ref 34.0–46.6)
Hemoglobin: 12.9 g/dL (ref 11.1–15.9)
MCH: 27.3 pg (ref 26.6–33.0)
MCHC: 33.8 g/dL (ref 31.5–35.7)
MCV: 81 fL (ref 79–97)
Platelets: 288 10*3/uL (ref 150–379)
RBC: 4.72 x10E6/uL (ref 3.77–5.28)
RDW: 13.8 % (ref 12.3–15.4)
WBC: 21.2 10*3/uL (ref 3.4–10.8)

## 2016-04-08 NOTE — Telephone Encounter (Signed)
Patient advised.

## 2016-04-08 NOTE — Telephone Encounter (Signed)
LMTCB

## 2016-04-08 NOTE — Telephone Encounter (Signed)
-----   Message from Trinna Post, [place] sent at 04/08/2016  2:12 PM EDT ----- CBC doesn't indicate anemia, increased WBC count in line with patient's CLL. Patient should keep her appointment in November with oncology. Metabolic panel is within normal limits. Magnesium is normal. Patient should aggressively push fluids over the next week. If she feels better, she doesn't need to come in. If she still feels dizzy, she should come in and we might consider changing her BP medication, which currently has a diuretic. Please advise, thank you!

## 2016-04-15 ENCOUNTER — Telehealth: Payer: Self-pay | Admitting: *Deleted

## 2016-04-15 NOTE — Telephone Encounter (Signed)
Went to see PCP for weakness and dizziness, had labs drawn, asking if she should be seen sooner than 11/16.

## 2016-04-15 NOTE — Telephone Encounter (Signed)
Per Dr [name], no need to be seen sooner, her labs are stable and does not fell it is the cause of her symptoms. Patient informed and was greatful for the return call.

## 2016-05-05 ENCOUNTER — Other Ambulatory Visit: Payer: Self-pay | Admitting: Family Medicine

## 2016-05-05 DIAGNOSIS — I10 Essential (primary) hypertension: Secondary | ICD-10-CM

## 2016-05-05 DIAGNOSIS — E785 Hyperlipidemia, unspecified: Secondary | ICD-10-CM

## 2016-05-12 NOTE — Progress Notes (Signed)
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  INTERVAL HISTORY:   Patient returns to clinic today for repeat laboratory work and further evaluation. She continues to feel well and remains asymptomatic. She has mild depression secondary to the recent death of her husband. She denies any fevers, night sweats, or weight loss. She has no neurologic complaints. She denies any chest pain or shortness of breath. She denies any nausea, vomiting, constipation, or diarrhea. She has no urinary complaints. Patient feels at her baseline and offers no specific complaints today.   REVIEW OF SYSTEMS:   Review of Systems  Constitutional: Negative.  Negative for diaphoresis, fever, malaise/fatigue and weight loss.  Respiratory: Negative.  Negative for cough and shortness of breath.   Cardiovascular: Negative.  Negative for chest pain and leg swelling.  Gastrointestinal: Negative.   Genitourinary: Negative.   Musculoskeletal: Negative.   Neurological: Negative.  Negative for weakness.  Psychiatric/Behavioral: Positive for depression. The patient is not nervous/anxious.     As per HPI. Otherwise, a complete review of systems is negative.  PAST MEDICAL HISTORY: Past Medical History:  Diagnosis Date  . Cancer [facility]) 2011   lymphoma  . Hyperlipidemia   . Hypertension     PAST SURGICAL HISTORY: Past Surgical History:  Procedure Laterality Date  . cataract surgery Bilateral 2005  . CHOLECYSTECTOMY  1991    FAMILY HISTORY Family History  Problem Relation Age of Onset  . Hypertension Mother   . Stroke Mother   . CAD Mother   . Hypertension Father   . Heart attack Father   . Hypertension Brother   . Seizures Brother      GYNECOLOGIC HISTORY:  No LMP recorded. Patient is postmenopausal.     ADVANCED DIRECTIVES:    HEALTH MAINTENANCE: Social History  Substance Use Topics  . Smoking status: Never Smoker  . Smokeless tobacco: Never Used  . Alcohol use No     Colonoscopy:  PAP:  Bone density:  Lipid panel:  Allergies  Allergen Reactions  . Penicillins     Current Outpatient Prescriptions  Medication Sig Dispense Refill  . Ascorbic Acid (VITAMIN C CR) 1000 MG TBCR Take by mouth.    [name] aspirin 81 MG tablet Take by mouth.    [name] COENZYME Q-10 PO Take by mouth.    [name] LORazepam (ATIVAN) 0.5 MG tablet Take 1 tablet (0.5 mg total) by mouth every 8 (eight) hours. 60 tablet 0  . losartan-hydrochlorothiazide (HYZAAR) 50-12.5 MG tablet TAKE ONE TABLET BY MOUTH ONCE DAILY 90 tablet 1  . simvastatin (ZOCOR) 5 MG tablet TAKE ONE TABLET BY MOUTH AT BEDTIME 90 tablet 1   No current facility-administered medications for this visit.     OBJECTIVE: BP 133/78 (BP Location: Right Arm, Patient Position: Sitting)   Pulse 89   Temp 97.5 F (36.4 C) (Tympanic)   Wt 136 lb 3.9 oz (61.8 kg)   BMI 25.96 kg/m    Body mass index is 25.96 kg/m.    ECOG FS:0 - Asymptomatic  General: Well-developed, well-nourished, no acute distress. Eyes: Pink conjunctiva, anicteric sclera. Lungs: Clear to auscultation bilaterally. Heart: Regular rate and rhythm. No rubs, murmurs, or gallops. Abdomen: Soft, nontender, nondistended. No organomegaly noted, normoactive bowel sounds. Musculoskeletal: No  edema, cyanosis, or clubbing. Neuro: Alert, answering all questions appropriately. Cranial nerves grossly intact. Skin: No rashes or petechiae noted. Psych: Normal affect. Lymphatics: No other palpable lymphadenopathy in cervical, clavicular, or inguinal areas.   LAB RESULTS:  Appointment on 05/14/2016  Component Date Value Ref Range Status  . WBC 05/14/2016 20.4* 3.6 - 11.0 K/uL Final  . RBC 05/14/2016 4.82  3.80 - 5.20  MIL/uL Final  . Hemoglobin 05/14/2016 13.2  12.0 - 16.0 g/dL Final  . HCT 05/14/2016 39.6  35.0 - 47.0 % Final  . MCV 05/14/2016 82.1  80.0 - 100.0 fL Final  . MCH 05/14/2016 27.4  26.0 - 34.0 pg Final  . MCHC 05/14/2016 33.4  32.0 - 36.0 g/dL Final  . RDW 05/14/2016 13.8  11.5 - 14.5 % Final  . Platelets 05/14/2016 281  150 - 440 K/uL Final  . Neutrophils Relative % 05/14/2016 23  % Final  . Neutro Abs 05/14/2016 4.6  1.4 - 6.5 K/uL Final  . Lymphocytes Relative 05/14/2016 71  % Final  . Lymphs Abs 05/14/2016 14.6* 1.0 - 3.6 K/uL Final  . Monocytes Relative 05/14/2016 4  % Final  . Monocytes Absolute 05/14/2016 0.8  0.2 - 0.9 K/uL Final  . Eosinophils Relative 05/14/2016 1  % Final  . Eosinophils Absolute 05/14/2016 0.1  0 - 0.7 K/uL Final  . Basophils Relative 05/14/2016 1  % Final  . Basophils Absolute 05/14/2016 0.3* 0 - 0.1 K/uL Final    STUDIES: No results found.  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  PLAN:    1. CLL variant: Patient's white blood cell count is mildly elevated but overall stable and unchanged since at least July 2016.  No intervention is needed at this time. Patient has no other cytopenias and she is asymptomatic. A bone marrow biopsy is not necessary. Can consider imaging in the future, but this is not necessary at this point either. Return to clinic in 6 months with repeat laboratory work and further evaluation. 2. Screening mammogram: Patient screening mammogram on March 10, 2016 reported as BI-RADS 1, repeat in one year.  Patient expressed understanding and was in agreement with this plan. She also understands that She can call clinic at any time with any questions, concerns, or complaints.    Lloyd Huger, MD   05/14/2016 10:31 AM

## 2016-05-14 ENCOUNTER — Inpatient Hospital Stay: Payer: PPO | Attending: Oncology | Admitting: Oncology

## 2016-05-14 ENCOUNTER — Inpatient Hospital Stay (HOSPITAL_BASED_OUTPATIENT_CLINIC_OR_DEPARTMENT_OTHER): Payer: PPO

## 2016-05-14 ENCOUNTER — Encounter: Payer: Self-pay | Admitting: Oncology

## 2016-05-14 VITALS — BP 133/78 | HR 89 | Temp 97.5°F | Wt 136.2 lb

## 2016-05-14 DIAGNOSIS — I10 Essential (primary) hypertension: Secondary | ICD-10-CM | POA: Insufficient documentation

## 2016-05-14 DIAGNOSIS — Z880 Allergy status to penicillin: Secondary | ICD-10-CM | POA: Diagnosis not present

## 2016-05-14 DIAGNOSIS — F4320 Adjustment disorder, unspecified: Secondary | ICD-10-CM | POA: Insufficient documentation

## 2016-05-14 DIAGNOSIS — Z79899 Other long term (current) drug therapy: Secondary | ICD-10-CM | POA: Insufficient documentation

## 2016-05-14 DIAGNOSIS — Z7982 Long term (current) use of aspirin: Secondary | ICD-10-CM | POA: Insufficient documentation

## 2016-05-14 DIAGNOSIS — F3289 Other specified depressive episodes: Secondary | ICD-10-CM | POA: Diagnosis not present

## 2016-05-14 DIAGNOSIS — C9110 Chronic lymphocytic leukemia of B-cell type not having achieved remission: Secondary | ICD-10-CM | POA: Diagnosis not present

## 2016-05-14 DIAGNOSIS — D72829 Elevated white blood cell count, unspecified: Secondary | ICD-10-CM | POA: Diagnosis not present

## 2016-05-14 DIAGNOSIS — E785 Hyperlipidemia, unspecified: Secondary | ICD-10-CM | POA: Diagnosis not present

## 2016-05-14 DIAGNOSIS — Z780 Asymptomatic menopausal state: Secondary | ICD-10-CM | POA: Insufficient documentation

## 2016-05-14 LAB — CBC WITH DIFFERENTIAL/PLATELET
BASOS ABS: 0.3 10*3/uL — AB (ref 0–0.1)
BASOS PCT: 1 %
EOS ABS: 0.1 10*3/uL (ref 0–0.7)
EOS PCT: 1 %
HCT: 39.6 % (ref 35.0–47.0)
Hemoglobin: 13.2 g/dL (ref 12.0–16.0)
Lymphocytes Relative: 71 %
Lymphs Abs: 14.6 10*3/uL — ABNORMAL HIGH (ref 1.0–3.6)
MCH: 27.4 pg (ref 26.0–34.0)
MCHC: 33.4 g/dL (ref 32.0–36.0)
MCV: 82.1 fL (ref 80.0–100.0)
MONO ABS: 0.8 10*3/uL (ref 0.2–0.9)
Monocytes Relative: 4 %
Neutro Abs: 4.6 10*3/uL (ref 1.4–6.5)
Neutrophils Relative %: 23 %
PLATELETS: 281 10*3/uL (ref 150–440)
RBC: 4.82 MIL/uL (ref 3.80–5.20)
RDW: 13.8 % (ref 11.5–14.5)
WBC: 20.4 10*3/uL — AB (ref 3.6–11.0)

## 2016-05-27 ENCOUNTER — Telehealth: Payer: Self-pay | Admitting: Family Medicine

## 2016-05-27 NOTE — Telephone Encounter (Signed)
Pt stated that she was returning a message that she needed to schedule her AWV. Pt is scheduled for AWV with [facility] on 06/24/16 @ 845 am. Pt has [employer] and was ok with only doing the AWV. Thanks TNP

## 2016-06-24 ENCOUNTER — Ambulatory Visit (INDEPENDENT_AMBULATORY_CARE_PROVIDER_SITE_OTHER): Payer: PPO

## 2016-06-24 VITALS — BP 143/76 | HR 72 | Temp 98.3°F | Ht 61.0 in | Wt 139.1 lb

## 2016-06-24 DIAGNOSIS — Z23 Encounter for immunization: Secondary | ICD-10-CM | POA: Diagnosis not present

## 2016-06-24 DIAGNOSIS — Z0000 Encounter for general adult medical examination without abnormal findings: Secondary | ICD-10-CM

## 2016-06-24 NOTE — Progress Notes (Signed)
Subjective:   Barbara Chambers is a 81 y.o. female who presents for Medicare Annual (Subsequent) preventive examination.  Review of Systems:  N/A  Cardiac Risk Factors include: advanced age (>38men, >103 women);dyslipidemia;hypertension     Objective:     Vitals: BP (!) 143/76 (BP Location: Right Arm)   Pulse 72   Temp 98.3 F (36.8 C) (Oral)   Ht 5\' 1"  (1.549 m)   Wt 139 lb 2 oz (63.1 kg)   BMI 26.29 kg/m   Body mass index is 26.29 kg/m.   Tobacco History  Smoking Status  . Never Smoker  Smokeless Tobacco  . Never Used     Counseling given: Not Answered   Past Medical History:  Diagnosis Date  . Cancer [facility]) 2011   lymphoma  . Hyperlipidemia   . Hypertension    Past Surgical History:  Procedure Laterality Date  . cataract surgery Bilateral 2005  . CHOLECYSTECTOMY  1991   Family History  Problem Relation Age of Onset  . Hypertension Mother   . Stroke Mother   . CAD Mother   . Hypertension Father   . Heart attack Father   . Hypertension Brother   . Seizures Brother    History  Sexual Activity  . Sexual activity: Not on file    Outpatient Encounter Prescriptions as of 06/24/2016  Medication Sig  . aspirin 81 MG tablet Take by mouth.  [name] COENZYME Q-10 PO Take by mouth.  . losartan-hydrochlorothiazide (HYZAAR) 50-12.5 MG tablet TAKE ONE TABLET BY MOUTH ONCE DAILY  . simvastatin (ZOCOR) 5 MG tablet TAKE ONE TABLET BY MOUTH AT BEDTIME  . Ascorbic Acid (VITAMIN C CR) 1000 MG TBCR Take by mouth.  [name] LORazepam (ATIVAN) 0.5 MG tablet Take 1 tablet (0.5 mg total) by mouth every 8 (eight) hours. (Patient not taking: Reported on 06/24/2016)   No facility-administered encounter medications on file as of 06/24/2016.     Activities of Daily Living In your present state of health, do you have any difficulty performing the following activities: 06/24/2016  Hearing? N  Vision? N  Difficulty concentrating or making decisions? N  Walking or climbing stairs? N    Dressing or bathing? N  Doing errands, shopping? N  Preparing Food and eating ? N  Using the Toilet? N  In the past six months, have you accidently leaked urine? Y  Do you have problems with loss of bowel control? N  Managing your Medications? N  Managing your Finances? N  Housekeeping or managing your Housekeeping? N  Some recent data might be hidden    Patient Care Team: Jerrol Banana., MD as PCP - General (Family Medicine) Lloyd Huger, MD as Consulting Physician (Oncology)    Assessment:     Exercise Activities and Dietary recommendations Current Exercise Habits: The patient does not participate in regular exercise at present (not currently due to weather and holidays, normally walks 4 days a week for 3 miles)  Goals    . Exercise          Starting 06/24/16, I will start back walking 4 days a week for 45 minutes.      Fall Risk Fall Risk  06/24/2016 12/26/2014  Falls in the past year? Yes No  Number falls in past yr: 1 -  Injury with Fall? No -  Follow up Falls prevention discussed -   Depression Screen PHQ 2/9 Scores 06/24/2016 12/26/2014  PHQ - 2 Score 1 0  Cognitive Function     6CIT Screen 06/24/2016  What Year? 0 points  What month? 0 points  What time? 0 points  Count back from 20 0 points  Months in reverse 0 points  Repeat phrase 2 points  Total Score 2    Immunization History  Administered Date(s) Administered  . Influenza, High Dose Seasonal PF 06/24/2016  . Pneumococcal Conjugate-13 03/30/2014  . Tdap 07/14/2005   Screening Tests Health Maintenance  Topic Date Due  . TETANUS/TDAP  06/24/2017 (Originally 07/15/2015)  . ZOSTAVAX  06/24/2026 (Originally 02/15/1997)  . PNA vac Low Risk Adult (2 of 2 - PPSV23) 06/24/2026 (Originally 03/31/2015)  . INFLUENZA VACCINE  Completed  . DEXA SCAN  Completed      Plan:  I have personally reviewed and addressed the Medicare Annual Wellness questionnaire and have noted the following  in the patient's chart:  A. Medical and social history B. Use of alcohol, tobacco or illicit drugs  C. Current medications and supplements D. Functional ability and status E.  Nutritional status F.  Physical activity G. Advance directives H. List of other physicians I.  Hospitalizations, surgeries, and ER visits in previous 12 months J.  [place] such as hearing and vision if needed, cognitive and depression L. Referrals and appointments - none  In addition, I have reviewed and discussed with patient certain preventive protocols, quality metrics, and best practice recommendations. A written personalized care plan for preventive services as well as general preventive health recommendations were provided to patient.  See attached scanned questionnaire for additional information.   Signed,  Fabio Neighbors, LPN Nurse Health Advisor   MD Recommendations: follow up on tdap, pt to check insurance coverage.  I have reviewed the health advisors note, was  available for consultation and I agree with documentation and plan. Miguel Aschoff MD [facility]

## 2016-06-24 NOTE — Patient Instructions (Signed)
Barbara Chambers , Thank you for taking time to come for your Medicare Wellness Visit. I appreciate your ongoing commitment to your health goals. Please review the following plan we discussed and let me know if I can assist you in the future.   These are the goals we discussed: Goals    . Exercise          Starting 06/24/16, I will start back walking 4 days a week for 45 minutes.       This is a list of the screening recommended for you and due dates:  Health Maintenance  Topic Date Due  . Shingles Vaccine  02/15/1997  . Pneumonia vaccines (2 of 2 - PPSV23) 03/31/2015  . Tetanus Vaccine  07/15/2015  . Flu Shot  01/28/2016  . DEXA scan (bone density measurement)  Completed   Preventive Care for Adults  A healthy lifestyle and preventive care can promote health and wellness. Preventive health guidelines for adults include the following key practices.  . A routine yearly physical is a good way to check with your health care provider about your health and preventive screening. It is a chance to share any concerns and updates on your health and to receive a thorough exam.  . Visit your dentist for a routine exam and preventive care every 6 months. Brush your teeth twice a day and floss once a day. Good oral hygiene prevents tooth decay and gum disease.  . The frequency of eye exams is based on your age, health, family medical history, use  of contact lenses, and other factors. Follow your health care provider's ecommendations for frequency of eye exams.  . Eat a healthy diet. Foods like vegetables, fruits, whole grains, low-fat dairy products, and lean protein foods contain the nutrients you need without too many calories. Decrease your intake of foods high in solid fats, added sugars, and salt. Eat the right amount of calories for you. Get information about a proper diet from your health care provider, if necessary.  . Regular physical exercise is one of the most important things you can do  for your health. Most adults should get at least 150 minutes of moderate-intensity exercise (any activity that increases your heart rate and causes you to sweat) each week. In addition, most adults need muscle-strengthening exercises on 2 or more days a week.  Silver Sneakers may be a benefit available to you. To determine eligibility, you may visit the website: www.silversneakers.com or contact program at 7011298334 Mon-Fri between 8AM-8PM.   . Maintain a healthy weight. The body mass index (BMI) is a screening tool to identify possible weight problems. It provides an estimate of body fat based on height and weight. Your health care provider can find your BMI and can help you achieve or maintain a healthy weight.   For adults 20 years and older: ? A BMI below 18.5 is considered underweight. ? A BMI of 18.5 to 24.9 is normal. ? A BMI of 25 to 29.9 is considered overweight. ? A BMI of 30 and above is considered obese.   . Maintain normal blood lipids and cholesterol levels by exercising and minimizing your intake of saturated fat. Eat a balanced diet with plenty of fruit and vegetables. Blood tests for lipids and cholesterol should begin at age 25 and be repeated every 5 years. If your lipid or cholesterol levels are high, you are over 50, or you are at high risk for heart disease, you may need your cholesterol levels checked  more frequently. Ongoing high lipid and cholesterol levels should be treated with medicines if diet and exercise are not working.  . If you smoke, find out from your health care provider how to quit. If you do not use tobacco, please do not start.  . If you choose to drink alcohol, please do not consume more than 2 drinks per day. One drink is considered to be 12 ounces (355 mL) of beer, 5 ounces (148 mL) of wine, or 1.5 ounces (44 mL) of liquor.  . If you are 64-66 years old, ask your health care provider if you should take aspirin to prevent strokes.  . Use sunscreen.  Apply sunscreen liberally and repeatedly throughout the day. You should seek shade when your shadow is shorter than you. Protect yourself by wearing long sleeves, pants, a wide-brimmed hat, and sunglasses year round, whenever you are outdoors.  . Once a month, do a whole body skin exam, using a mirror to look at the skin on your back. Tell your health care provider of new moles, moles that have irregular borders, moles that are larger than a pencil eraser, or moles that have changed in shape or color.

## 2016-07-02 ENCOUNTER — Ambulatory Visit: Payer: PPO | Admitting: Physician Assistant

## 2016-07-07 ENCOUNTER — Encounter: Payer: Self-pay | Admitting: Physician Assistant

## 2016-07-07 ENCOUNTER — Ambulatory Visit (INDEPENDENT_AMBULATORY_CARE_PROVIDER_SITE_OTHER): Payer: PPO | Admitting: Physician Assistant

## 2016-07-07 VITALS — BP 126/84 | HR 80 | Temp 98.2°F | Resp 16 | Wt 136.0 lb

## 2016-07-07 DIAGNOSIS — E039 Hypothyroidism, unspecified: Secondary | ICD-10-CM | POA: Diagnosis not present

## 2016-07-07 DIAGNOSIS — E038 Other specified hypothyroidism: Secondary | ICD-10-CM

## 2016-07-07 DIAGNOSIS — Z0000 Encounter for general adult medical examination without abnormal findings: Secondary | ICD-10-CM

## 2016-07-07 DIAGNOSIS — F419 Anxiety disorder, unspecified: Secondary | ICD-10-CM | POA: Diagnosis not present

## 2016-07-07 DIAGNOSIS — E785 Hyperlipidemia, unspecified: Secondary | ICD-10-CM

## 2016-07-07 DIAGNOSIS — C9110 Chronic lymphocytic leukemia of B-cell type not having achieved remission: Secondary | ICD-10-CM | POA: Diagnosis not present

## 2016-07-07 DIAGNOSIS — I10 Essential (primary) hypertension: Secondary | ICD-10-CM

## 2016-07-07 NOTE — Patient Instructions (Signed)
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
## 2016-07-08 ENCOUNTER — Other Ambulatory Visit: Payer: Self-pay | Admitting: Physician Assistant

## 2016-07-08 DIAGNOSIS — R7989 Other specified abnormal findings of blood chemistry: Secondary | ICD-10-CM

## 2016-07-08 LAB — LIPID PANEL
Chol/HDL Ratio: 3.4 ratio units (ref 0.0–4.4)
Cholesterol, Total: 185 mg/dL (ref 100–199)
HDL: 55 mg/dL (ref >39)
LDL Calculated: 105 mg/dL — ABNORMAL HIGH (ref 0–99)
Triglycerides: 124 mg/dL (ref 0–149)
VLDL Cholesterol Cal: 25 mg/dL (ref 5–40)

## 2016-07-08 LAB — TSH: TSH: 4.55 u[IU]/mL — ABNORMAL HIGH (ref 0.450–4.500)

## 2016-07-08 NOTE — Progress Notes (Signed)
Patient advised. Patient will call back to request a lab slip in 1 month.

## 2016-07-08 NOTE — Progress Notes (Signed)
TSH was slightly above normal limits. Would like to have patient repeat lab in one month, along with a T4 level. She does not need an office visit for this, just to get the labs at [place]. Also, her cholesterol profile was stable.

## 2016-07-21 ENCOUNTER — Ambulatory Visit (INDEPENDENT_AMBULATORY_CARE_PROVIDER_SITE_OTHER): Payer: PPO | Admitting: Physician Assistant

## 2016-07-21 ENCOUNTER — Encounter: Payer: Self-pay | Admitting: Physician Assistant

## 2016-07-21 VITALS — BP 118/64 | HR 88 | Temp 99.1°F | Resp 16 | Wt 136.0 lb

## 2016-07-21 DIAGNOSIS — J069 Acute upper respiratory infection, unspecified: Secondary | ICD-10-CM

## 2016-07-21 NOTE — Progress Notes (Signed)
[place]  Chief Complaint  Patient presents with  . URI    Started Saturday    Subjective:    Patient ID: Barbara Chambers, female    DOB: 06-12-1935, 82 y.o.   MRN: [id number]  Upper Respiratory Infection: Barbara Chambers is a 82 y.o. female with a past medical history significant for Allergies, CLL variant, complaining of symptoms of a URI. Symptoms include congestion, cough, plugged sensation in both ears and sore throat. Onset of symptoms was 3 days ago, gradually improving since that time. She also c/o bilateral ear pressure/pain, congestion, cough described as productive and nasal congestion for the past 3 days . Cough productive of some minimal phlegm. She is drinking plenty of fluids. Evaluation to date: none. Treatment to date: cough suppressants. The treatment has provided some relief. She does feel better today. No fevers. No nausea or vomiting, no myalgias. Her son who visits daily had the same symptoms.  Review of Systems  Constitutional: Positive for chills and fatigue. Negative for activity change, appetite change, diaphoresis, fever and unexpected weight change.  HENT: Positive for congestion, postnasal drip, rhinorrhea, sinus pain, sinus pressure, sore throat and voice change. Negative for ear discharge, ear pain (Pt reports her ears "are popping".), hearing loss and nosebleeds.   Eyes: Positive for discharge and itching. Negative for photophobia, pain, redness and visual disturbance.  Respiratory: Positive for cough, chest tightness and wheezing (Occasionally). Negative for apnea, choking, shortness of breath and stridor.   Gastrointestinal: Negative.   Musculoskeletal: Negative for neck pain and neck stiffness.  Neurological: Negative for dizziness, light-headedness and headaches.       Objective:   BP 118/64 (BP Location: Left Arm, Patient Position: Sitting, Cuff Size: Normal)   Pulse 88   Temp 99.1 F (37.3 C) (Oral)   Resp 16    Wt 136 lb (61.7 kg)   SpO2 97%   BMI 25.70 kg/m   Patient Active Problem List   Diagnosis Date Noted  . Low back pain 03/08/2015  . Arthritis 03/08/2015  . Anxiety 10/25/2014  . Chronic lymphatic leukemia ([place]) 10/25/2014  . HLD (hyperlipidemia) 10/25/2014  . BP (high blood pressure) 10/25/2014  . Subclinical hypothyroidism 10/25/2014  . Abnormal ECG 10/11/2014  . Benign essential HTN 10/11/2014    Outpatient Encounter Prescriptions as of 07/21/2016  Medication Sig Note  . aspirin 81 MG tablet Take by mouth. 10/25/2014: Received from: [facility]  . COENZYME Q-10 PO Take by mouth. 10/25/2014: Received from: [facility]  . LORazepam (ATIVAN) 0.5 MG tablet Take 1 tablet (0.5 mg total) by mouth every 8 (eight) hours.   [name] losartan-hydrochlorothiazide (HYZAAR) 50-12.5 MG tablet TAKE ONE TABLET BY MOUTH ONCE DAILY   . simvastatin (ZOCOR) 5 MG tablet TAKE ONE TABLET BY MOUTH AT BEDTIME    No facility-administered encounter medications on file as of 07/21/2016.     Allergies  Allergen Reactions  . Penicillins        Physical Exam  Constitutional: She is oriented to person, place, and time. She appears well-developed and well-nourished.  Non-toxic appearance. She does not have a sickly appearance. She does not appear ill. No distress.  HENT:  Right Ear: Tympanic membrane and ear canal normal. No drainage.  Left Ear: Tympanic membrane and ear canal normal. No drainage.  Nose: Rhinorrhea present.  Mouth/Throat: Posterior oropharyngeal erythema present. No posterior oropharyngeal edema or tonsillar abscesses.  Clear rhinorrhea  Eyes: Right eye exhibits discharge. Left eye  exhibits discharge.  Clear watery discharge  Neck: Neck supple.  Cardiovascular: Normal rate.   Pulmonary/Chest: Effort normal and breath sounds normal. No respiratory distress. She has no wheezes. She has no rales.  Lymphadenopathy:    She has no cervical adenopathy.   Neurological: She is alert and oriented to person, place, and time.  Skin: Skin is warm and dry.  Psychiatric: She has a normal mood and affect. Her behavior is normal.       Assessment & Plan:   1. Upper respiratory tract infection, unspecified type  Patient is afebrile, nontoxic looking in office. No SOB or increased work of breathing, no adverse lung sounds, oxygenating well. Appears to have viral URI, esp. Considering history of exposure to her son. Offered patient flu swab and CXR, which patient declines as she wishes to wait. Do think it's reasonable to engage in some watchful waiting. Patient is leaving town in a week. Discussed with patient about return precautions, and that she needs to call or come back in if she doesn't feel better before the weekend. May continue to use Hycodan from September Rx for cough.   Recommend rest, fluids, frequent hand washing.   Return if symptoms worsen or fail to improve.   Patient Instructions  Upper Respiratory Infection, Adult Most upper respiratory infections (URIs) are caused by a virus. A URI affects the nose, throat, and upper air passages. The most common type of URI is often called "the common cold." Follow these instructions at home:  Take medicines only as told by your doctor.  Gargle warm saltwater or take cough drops to comfort your throat as told by your doctor.  Use a warm mist humidifier or inhale steam from a shower to increase air moisture. This may make it easier to breathe.  Drink enough fluid to keep your pee (urine) clear or pale yellow.  Eat soups and other clear broths.  Have a healthy diet.  Rest as needed.  Go back to work when your fever is gone or your doctor says it is okay.  You may need to stay home longer to avoid giving your URI to others.  You can also wear a face mask and wash your hands often to prevent spread of the virus.  Use your inhaler more if you have asthma.  Do not use any tobacco  products, including cigarettes, chewing tobacco, or electronic cigarettes. If you need help quitting, ask your doctor. Contact a doctor if:  You are getting worse, not better.  Your symptoms are not helped by medicine.  You have chills.  You are getting more short of breath.  You have brown or red mucus.  You have yellow or brown discharge from your nose.  You have pain in your face, especially when you bend forward.  You have a fever.  You have puffy (swollen) neck glands.  You have pain while swallowing.  You have white areas in the back of your throat. Get help right away if:  You have very bad or constant:  Headache.  Ear pain.  Pain in your forehead, behind your eyes, and over your cheekbones (sinus pain).  Chest pain.  You have long-lasting (chronic) lung disease and any of the following:  Wheezing.  Long-lasting cough.  Coughing up blood.  A change in your usual mucus.  You have a stiff neck.  You have changes in your:  Vision.  Hearing.  Thinking.  Mood. This information is not intended to replace advice given to  you by your health care provider. Make sure you discuss any questions you have with your health care provider. Document Released: 12/02/2007 Document Revised: 02/16/2016 Document Reviewed: 09/20/2013 Elsevier Interactive Patient Education  2017 [employer].     The entirety of the information documented in the History of Present Illness, Review of Systems and Physical Exam were personally obtained by me. Portions of this information were initially documented by Ashley Royalty, CMA and reviewed by me for thoroughness and accuracy.

## 2016-07-21 NOTE — Patient Instructions (Signed)
Upper Respiratory Infection, Adult Most upper respiratory infections (URIs) are caused by a virus. A URI affects the nose, throat, and upper air passages. The most common type of URI is often called "the common cold." Follow these instructions at home:  Take medicines only as told by your doctor.  Gargle warm saltwater or take cough drops to comfort your throat as told by your doctor.  Use a warm mist humidifier or inhale steam from a shower to increase air moisture. This may make it easier to breathe.  Drink enough fluid to keep your pee (urine) clear or pale yellow.  Eat soups and other clear broths.  Have a healthy diet.  Rest as needed.  Go back to work when your fever is gone or your doctor says it is okay.  You may need to stay home longer to avoid giving your URI to others.  You can also wear a face mask and wash your hands often to prevent spread of the virus.  Use your inhaler more if you have asthma.  Do not use any tobacco products, including cigarettes, chewing tobacco, or electronic cigarettes. If you need help quitting, ask your doctor. Contact a doctor if:  You are getting worse, not better.  Your symptoms are not helped by medicine.  You have chills.  You are getting more short of breath.  You have brown or red mucus.  You have yellow or brown discharge from your nose.  You have pain in your face, especially when you bend forward.  You have a fever.  You have puffy (swollen) neck glands.  You have pain while swallowing.  You have white areas in the back of your throat. Get help right away if:  You have very bad or constant:  Headache.  Ear pain.  Pain in your forehead, behind your eyes, and over your cheekbones (sinus pain).  Chest pain.  You have long-lasting (chronic) lung disease and any of the following:  Wheezing.  Long-lasting cough.  Coughing up blood.  A change in your usual mucus.  You have a stiff neck.  You have  changes in your:  Vision.  Hearing.  Thinking.  Mood. This information is not intended to replace advice given to you by your health care provider. Make sure you discuss any questions you have with your health care provider. Document Released: 12/02/2007 Document Revised: 02/16/2016 Document Reviewed: 09/20/2013 Elsevier Interactive Patient Education  2017 [employer] Inc.  

## 2016-08-27 ENCOUNTER — Other Ambulatory Visit: Payer: Self-pay | Admitting: Physician Assistant

## 2016-08-27 DIAGNOSIS — R946 Abnormal results of thyroid function studies: Secondary | ICD-10-CM | POA: Diagnosis not present

## 2016-08-28 LAB — T4 AND TSH
T4, Total: 6.9 ug/dL (ref 4.5–12.0)
TSH: 4.21 u[IU]/mL (ref 0.450–4.500)

## 2016-08-28 NOTE — Progress Notes (Signed)
Advised  ED 

## 2016-09-08 DIAGNOSIS — L821 Other seborrheic keratosis: Secondary | ICD-10-CM | POA: Diagnosis not present

## 2016-09-08 DIAGNOSIS — L720 Epidermal cyst: Secondary | ICD-10-CM | POA: Diagnosis not present

## 2016-09-08 DIAGNOSIS — C44519 Basal cell carcinoma of skin of other part of trunk: Secondary | ICD-10-CM | POA: Diagnosis not present

## 2016-09-08 DIAGNOSIS — Z1283 Encounter for screening for malignant neoplasm of skin: Secondary | ICD-10-CM | POA: Diagnosis not present

## 2016-09-08 DIAGNOSIS — I8393 Asymptomatic varicose veins of bilateral lower extremities: Secondary | ICD-10-CM | POA: Diagnosis not present

## 2016-09-08 DIAGNOSIS — L570 Actinic keratosis: Secondary | ICD-10-CM | POA: Diagnosis not present

## 2016-09-08 DIAGNOSIS — L812 Freckles: Secondary | ICD-10-CM | POA: Diagnosis not present

## 2016-09-08 DIAGNOSIS — Z85828 Personal history of other malignant neoplasm of skin: Secondary | ICD-10-CM | POA: Diagnosis not present

## 2016-09-08 DIAGNOSIS — L820 Inflamed seborrheic keratosis: Secondary | ICD-10-CM | POA: Diagnosis not present

## 2016-10-05 DIAGNOSIS — I10 Essential (primary) hypertension: Secondary | ICD-10-CM | POA: Diagnosis not present

## 2016-10-05 DIAGNOSIS — E782 Mixed hyperlipidemia: Secondary | ICD-10-CM | POA: Diagnosis not present

## 2016-10-05 DIAGNOSIS — R9431 Abnormal electrocardiogram [ECG] [EKG]: Secondary | ICD-10-CM | POA: Diagnosis not present

## 2016-11-07 ENCOUNTER — Other Ambulatory Visit: Payer: Self-pay | Admitting: Family Medicine

## 2016-11-07 DIAGNOSIS — I10 Essential (primary) hypertension: Secondary | ICD-10-CM

## 2016-11-07 DIAGNOSIS — E785 Hyperlipidemia, unspecified: Secondary | ICD-10-CM

## 2016-11-10 NOTE — Progress Notes (Signed)
[place]  [phone]  [phone]     Barbara Chambers DOB: [birth year]  MR#: [id number]  AOZ#:[id number]  Patient Care Team: Jerrol Banana., MD as PCP - General (Family Medicine) Lloyd Huger, MD as Consulting Physician (Oncology)  CHIEF COMPLAINT: Low-grade lymphoma, but not classic for CLL. Considered "CLL variant".  INTERVAL HISTORY:   Patient returns to clinic today for repeat laboratory work and further evaluation. She continues to feel well and remains asymptomatic. She denies any fevers, night sweats, or weight loss. She has no neurologic complaints. She denies any chest pain or shortness of breath. She denies any nausea, vomiting, constipation, or diarrhea. She has no urinary complaints. Patient feels at her baseline and offers no specific complaints today.   REVIEW OF SYSTEMS:   Review of Systems  Constitutional: Negative.  Negative for diaphoresis, fever, malaise/fatigue and weight loss.  Respiratory: Negative.  Negative for cough and shortness of breath.   Cardiovascular: Negative.  Negative for chest pain and leg swelling.  Gastrointestinal: Negative.  Negative for abdominal pain.  Genitourinary: Negative.   Musculoskeletal: Negative.   Skin: Negative.  Negative for rash.  Neurological: Negative.  Negative for sensory change and weakness.  Psychiatric/Behavioral: Negative for depression. The patient is not nervous/anxious.     As per HPI. Otherwise, a complete review of systems is negative.  PAST MEDICAL HISTORY: Past Medical History:  Diagnosis Date  . Cancer [facility]) 2011   lymphoma  . Hyperlipidemia   . Hypertension     PAST SURGICAL HISTORY: Past Surgical History:  Procedure Laterality Date  . cataract surgery Bilateral 2005  . CHOLECYSTECTOMY  1991    FAMILY HISTORY Family History  Problem Relation Age of Onset  . Hypertension Mother   . Stroke Mother   . CAD Mother   . Hypertension Father   . Heart  attack Father   . Hypertension Brother   . Seizures Brother     GYNECOLOGIC HISTORY:  No LMP recorded. Patient is postmenopausal.     ADVANCED DIRECTIVES:    HEALTH MAINTENANCE: Social History  Substance Use Topics  . Smoking status: Never Smoker  . Smokeless tobacco: Never Used  . Alcohol use No     Colonoscopy:  PAP:  Bone density:  Lipid panel:  Allergies  Allergen Reactions  . Penicillins     Current Outpatient Prescriptions  Medication Sig Dispense Refill  . aspirin 81 MG tablet Take by mouth.    [name] COENZYME Q-10 PO Take by mouth.    [name] LORazepam (ATIVAN) 0.5 MG tablet Take 1 tablet (0.5 mg total) by mouth every 8 (eight) hours. 60 tablet 0  . losartan-hydrochlorothiazide (HYZAAR) 50-12.5 MG tablet TAKE ONE TABLET BY MOUTH ONCE DAILY 90 tablet 1  . simvastatin (ZOCOR) 5 MG tablet TAKE ONE TABLET BY MOUTH AT BEDTIME 90 tablet 1   No current facility-administered medications for this visit.     OBJECTIVE: BP (!) 144/77   Pulse 67   Temp 97.7 F (36.5 C) (Tympanic)   Resp 20   Wt 137 lb (62.1 kg)   BMI 25.89 kg/m    Body mass index is 25.89 kg/m.    ECOG FS:0 - Asymptomatic  General: Well-developed, well-nourished, no acute distress. Eyes: Pink conjunctiva, anicteric sclera. Lungs: Clear to auscultation bilaterally. Heart: Regular rate and rhythm. No rubs, murmurs, or gallops. Abdomen: Soft, nontender, nondistended. No organomegaly noted, normoactive bowel sounds. Musculoskeletal: No edema, cyanosis, or clubbing. Neuro: Alert, answering all questions  appropriately. Cranial nerves grossly intact. Skin: No rashes or petechiae noted. Psych: Normal affect. Lymphatics: No palpable lymphadenopathy in cervical, clavicular, or inguinal areas.   LAB RESULTS:  Appointment on 11/11/2016  Component Date Value Ref Range Status  . WBC 11/11/2016 17.9* 3.6 - 11.0 K/uL Final  . RBC 11/11/2016 4.58  3.80 - 5.20 MIL/uL Final  . Hemoglobin 11/11/2016 12.6  12.0 -  16.0 g/dL Final  . HCT 11/11/2016 37.2  35.0 - 47.0 % Final  . MCV 11/11/2016 81.4  80.0 - 100.0 fL Final  . MCH 11/11/2016 27.6  26.0 - 34.0 pg Final  . MCHC 11/11/2016 34.0  32.0 - 36.0 g/dL Final  . RDW 11/11/2016 13.8  11.5 - 14.5 % Final  . Platelets 11/11/2016 290  150 - 440 K/uL Final  . Neutrophils Relative % 11/11/2016 25  % Final  . Neutro Abs 11/11/2016 4.5  1.4 - 6.5 K/uL Final  . Lymphocytes Relative 11/11/2016 70  % Final  . Lymphs Abs 11/11/2016 12.6* 1.0 - 3.6 K/uL Final  . Monocytes Relative 11/11/2016 4  % Final  . Monocytes Absolute 11/11/2016 0.7  0.2 - 0.9 K/uL Final  . Eosinophils Relative 11/11/2016 1  % Final  . Eosinophils Absolute 11/11/2016 0.1  0 - 0.7 K/uL Final  . Basophils Relative 11/11/2016 0  % Final  . Basophils Absolute 11/11/2016 0.1  0 - 0.1 K/uL Final    STUDIES: No results found.  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  PLAN:    1. CLL variant: Patient's white blood cell count is mildly elevated but overall stable and unchanged since at least July 2016.  No intervention is needed at this time. Patient has no other cytopenias and she is asymptomatic. A bone marrow biopsy is not necessary. Can consider imaging in the future, but this is not necessary at this point either. Patient has requested less frequent follow-up, therefore will return to clinic in 1 year with repeat laboratory work and further evaluation. 2. Screening mammogram: Patient's screening mammogram on March 10, 2016 reported as BI-RADS 1, repeat in one year.  Patient expressed understanding and was in agreement with this plan. She also understands that She can call clinic at any time with any questions, concerns, or complaints.    Lloyd Huger, MD   11/14/2016 7:11 AM

## 2016-11-11 ENCOUNTER — Inpatient Hospital Stay (HOSPITAL_BASED_OUTPATIENT_CLINIC_OR_DEPARTMENT_OTHER): Payer: PPO | Admitting: Oncology

## 2016-11-11 ENCOUNTER — Inpatient Hospital Stay: Payer: PPO | Attending: Oncology

## 2016-11-11 VITALS — BP 144/77 | HR 67 | Temp 97.7°F | Resp 20 | Wt 137.0 lb

## 2016-11-11 DIAGNOSIS — E785 Hyperlipidemia, unspecified: Secondary | ICD-10-CM

## 2016-11-11 DIAGNOSIS — I10 Essential (primary) hypertension: Secondary | ICD-10-CM | POA: Insufficient documentation

## 2016-11-11 DIAGNOSIS — Z7982 Long term (current) use of aspirin: Secondary | ICD-10-CM

## 2016-11-11 DIAGNOSIS — Z880 Allergy status to penicillin: Secondary | ICD-10-CM | POA: Diagnosis not present

## 2016-11-11 DIAGNOSIS — C9110 Chronic lymphocytic leukemia of B-cell type not having achieved remission: Secondary | ICD-10-CM | POA: Insufficient documentation

## 2016-11-11 DIAGNOSIS — Z780 Asymptomatic menopausal state: Secondary | ICD-10-CM | POA: Diagnosis not present

## 2016-11-11 DIAGNOSIS — Z79899 Other long term (current) drug therapy: Secondary | ICD-10-CM

## 2016-11-11 LAB — CBC WITH DIFFERENTIAL/PLATELET
BASOS ABS: 0.1 10*3/uL (ref 0–0.1)
BASOS PCT: 0 %
EOS ABS: 0.1 10*3/uL (ref 0–0.7)
EOS PCT: 1 %
HCT: 37.2 % (ref 35.0–47.0)
Hemoglobin: 12.6 g/dL (ref 12.0–16.0)
Lymphocytes Relative: 70 %
Lymphs Abs: 12.6 10*3/uL — ABNORMAL HIGH (ref 1.0–3.6)
MCH: 27.6 pg (ref 26.0–34.0)
MCHC: 34 g/dL (ref 32.0–36.0)
MCV: 81.4 fL (ref 80.0–100.0)
Monocytes Absolute: 0.7 10*3/uL (ref 0.2–0.9)
Monocytes Relative: 4 %
Neutro Abs: 4.5 10*3/uL (ref 1.4–6.5)
Neutrophils Relative %: 25 %
PLATELETS: 290 10*3/uL (ref 150–440)
RBC: 4.58 MIL/uL (ref 3.80–5.20)
RDW: 13.8 % (ref 11.5–14.5)
WBC: 17.9 10*3/uL — ABNORMAL HIGH (ref 3.6–11.0)

## 2016-11-11 NOTE — Progress Notes (Signed)
Patient here today for routine follow up regarding CLL, denies any concerns.

## 2017-02-02 ENCOUNTER — Other Ambulatory Visit: Payer: Self-pay | Admitting: Physician Assistant

## 2017-02-02 DIAGNOSIS — Z1231 Encounter for screening mammogram for malignant neoplasm of breast: Secondary | ICD-10-CM

## 2017-03-05 ENCOUNTER — Encounter: Payer: Self-pay | Admitting: Family Medicine

## 2017-03-05 ENCOUNTER — Ambulatory Visit (INDEPENDENT_AMBULATORY_CARE_PROVIDER_SITE_OTHER): Payer: PPO | Admitting: Family Medicine

## 2017-03-05 VITALS — BP 112/62 | HR 80 | Temp 97.9°F | Resp 16 | Wt 133.0 lb

## 2017-03-05 DIAGNOSIS — L247 Irritant contact dermatitis due to plants, except food: Secondary | ICD-10-CM

## 2017-03-05 MED ORDER — PREDNISONE 20 MG PO TABS: ORAL_TABLET | ORAL | 0 refills | Status: DC

## 2017-03-05 NOTE — Progress Notes (Signed)
Patient: Barbara Chambers Female    DOB: August 19, 1934   82 y.o.   MRN: [id number] Visit Date: 03/05/2017  Today's Provider: Lavon Paganini, MD   Chief Complaint  Patient presents with  . Rash    Bilateral legs; started about 10 days ago.   Subjective:    Rash  This is a new problem. The current episode started in the past 7 days. The affected locations include the left lower leg, right lower leg and right fingers. The rash is characterized by blistering, redness and itchiness. She was exposed to nothing. Associated symptoms include fatigue (Pt says "I'm not sick, but I don't feel good."). Pertinent negatives include no fever. Past treatments include nothing.   Worked in the yard last week before the rash started Grass was dewey - was told that it might be dew poisoning Itchy hasnt tried any medications Trying not to touch it because it seems to spread and worsen with scratching      Allergies  Allergen Reactions  . Penicillins      Current Outpatient Prescriptions:  .  aspirin 81 MG tablet, Take by mouth., Disp: , Rfl:  .  COENZYME Q-10 PO, Take by mouth., Disp: , Rfl:  .  LORazepam (ATIVAN) 0.5 MG tablet, Take 1 tablet (0.5 mg total) by mouth every 8 (eight) hours., Disp: 60 tablet, Rfl: 0 .  losartan-hydrochlorothiazide (HYZAAR) 50-12.5 MG tablet, TAKE ONE TABLET BY MOUTH ONCE DAILY, Disp: 90 tablet, Rfl: 1 .  simvastatin (ZOCOR) 5 MG tablet, TAKE ONE TABLET BY MOUTH AT BEDTIME, Disp: 90 tablet, Rfl: 1  Review of Systems  Constitutional: Positive for fatigue (Pt says "I'm not sick, but I don't feel good."). Negative for activity change, appetite change, chills, diaphoresis, fever and unexpected weight change.  HENT: Negative.   Eyes: Negative.   Respiratory: Negative.   Cardiovascular: Negative.   Gastrointestinal: Negative.   Genitourinary: Negative.   Musculoskeletal: Negative.   Skin: Positive for rash. Negative for color change, pallor and wound.  Neurological:  Negative.   Psychiatric/Behavioral: Negative.     Social History  Substance Use Topics  . Smoking status: Never Smoker  . Smokeless tobacco: Never Used  . Alcohol use No   Objective:   BP 112/62 (BP Location: Left Arm, Patient Position: Sitting, Cuff Size: Normal)   Pulse 80   Temp 97.9 F (36.6 C) (Oral)   Resp 16   Wt 133 lb (60.3 kg)   BMI 25.13 kg/m  Vitals:   03/05/17 1448  BP: 112/62  Pulse: 80  Resp: 16  Temp: 97.9 F (36.6 C)  TempSrc: Oral  Weight: 133 lb (60.3 kg)     Physical Exam  Constitutional: She is oriented to person, place, and time. She appears well-developed and well-nourished. No distress.  HENT:  Head: Normocephalic and atraumatic.  Eyes: Pupils are equal, round, and reactive to light. Conjunctivae are normal. No scleral icterus.  Neck: Neck supple.  Cardiovascular: Normal rate, regular rhythm, normal heart sounds and intact distal pulses.   No murmur heard. Pulmonary/Chest: Effort normal and breath sounds normal. No respiratory distress. She has no wheezes. She has no rales.  Abdominal: Soft. Bowel sounds are normal. She exhibits no distension. There is no tenderness. There is no rebound and no guarding.  Musculoskeletal: She exhibits no edema or deformity.  Lymphadenopathy:    She has no cervical adenopathy.  Neurological: She is alert and oriented to person, place, and time.  Skin: Skin is warm  and dry.  Erythematous maculopapular rash over lower extremities in various stages of healing with excoriations  Vitals reviewed.      Assessment & Plan:     1. Irritant contact dermatitis due to plants, except food - likely a contact allergic rash from some plant in the yard - could be poison ivy/oak - treat with long taper of prednisone - return precautions discussed  Meds ordered this encounter  Medications  . predniSONE (DELTASONE) 20 MG tablet    Sig: Take 60mg  PO daily x7d, then 40mg  PO daily x7d, then 20mg  daily x7d, then stop     Dispense:  42 tablet    Refill:  0       The entirety of the information documented in the History of Present Illness, Review of Systems and Physical Exam were personally obtained by me. Portions of this information were initially documented by Raquel Sarna Ratchford, CMA and reviewed by me for thoroughness and accuracy.     Lavon Paganini, MD  [facility]

## 2017-03-05 NOTE — Patient Instructions (Signed)

## 2017-03-16 DIAGNOSIS — R21 Rash and other nonspecific skin eruption: Secondary | ICD-10-CM | POA: Diagnosis not present

## 2017-03-16 DIAGNOSIS — L821 Other seborrheic keratosis: Secondary | ICD-10-CM | POA: Diagnosis not present

## 2017-03-16 DIAGNOSIS — Z85828 Personal history of other malignant neoplasm of skin: Secondary | ICD-10-CM | POA: Diagnosis not present

## 2017-03-16 DIAGNOSIS — L820 Inflamed seborrheic keratosis: Secondary | ICD-10-CM | POA: Diagnosis not present

## 2017-03-16 DIAGNOSIS — L570 Actinic keratosis: Secondary | ICD-10-CM | POA: Diagnosis not present

## 2017-03-23 ENCOUNTER — Other Ambulatory Visit: Payer: Self-pay | Admitting: Family Medicine

## 2017-03-23 NOTE — Telephone Encounter (Signed)
Patient will need to be seen if her rash is not gone and she thinks she needs more prednisone.  [name], MD, MPH [facility] 03/23/2017 8:19 AM

## 2017-04-05 ENCOUNTER — Ambulatory Visit (INDEPENDENT_AMBULATORY_CARE_PROVIDER_SITE_OTHER): Payer: PPO | Admitting: Physician Assistant

## 2017-04-05 ENCOUNTER — Encounter: Payer: Self-pay | Admitting: Physician Assistant

## 2017-04-05 VITALS — BP 124/68 | HR 80 | Temp 97.9°F | Resp 16 | Wt 134.0 lb

## 2017-04-05 DIAGNOSIS — S81812A Laceration without foreign body, left lower leg, initial encounter: Secondary | ICD-10-CM

## 2017-04-05 DIAGNOSIS — L089 Local infection of the skin and subcutaneous tissue, unspecified: Secondary | ICD-10-CM | POA: Diagnosis not present

## 2017-04-05 DIAGNOSIS — T148XXA Other injury of unspecified body region, initial encounter: Principal | ICD-10-CM

## 2017-04-05 MED ORDER — DOXYCYCLINE HYCLATE 100 MG PO TABS: 100.0000 mg | ORAL_TABLET | Freq: Two times a day (BID) | ORAL | 0 refills | Status: DC

## 2017-04-05 NOTE — Progress Notes (Signed)
Patient: Barbara Chambers Female    DOB: [birth year]   82 y.o.   MRN: [id number] Visit Date: 04/05/2017  Today's Provider: Trinna Post, PA-C   Chief Complaint  Patient presents with  . Wound Infection    Started about five days ago.   Subjective:     Barbara Chambers is an 82 y/o woman who presents today for possible infection. She reports 5 days ago on Wednesday, she was walking and bumped into a planter, which caused a skin tear on her left lower leg. She took some scissors and cut the skin flap off, cleaned the wound. She has noticed an increasing redness around the wound and travelling down her leg. She has not noticed any heat to the wound or green drainage. She herself does not feel feverish or chilly.   Wound Check  She was originally treated 5 to 10 days ago. Her temperature was unmeasured prior to arrival. There has been no drainage from the wound. The redness has worsened. There is new pain present. She has no difficulty moving the affected extremity or digit.       Allergies  Allergen Reactions  . Penicillins      Current Outpatient Prescriptions:  .  aspirin 81 MG tablet, Take by mouth., Disp: , Rfl:  .  COENZYME Q-10 PO, Take by mouth., Disp: , Rfl:  .  LORazepam (ATIVAN) 0.5 MG tablet, Take 1 tablet (0.5 mg total) by mouth every 8 (eight) hours., Disp: 60 tablet, Rfl: 0 .  losartan-hydrochlorothiazide (HYZAAR) 50-12.5 MG tablet, TAKE ONE TABLET BY MOUTH ONCE DAILY, Disp: 90 tablet, Rfl: 1 .  predniSONE (DELTASONE) 20 MG tablet, Take 60mg  PO daily x7d, then 40mg  PO daily x7d, then 20mg  daily x7d, then stop, Disp: 42 tablet, Rfl: 0 .  simvastatin (ZOCOR) 5 MG tablet, TAKE ONE TABLET BY MOUTH AT BEDTIME, Disp: 90 tablet, Rfl: 1  Review of Systems  Constitutional: Negative.   Skin: Positive for wound. Negative for color change, pallor and rash.    Social History  Substance Use Topics  . Smoking status: Never Smoker  . Smokeless tobacco: Never Used  .  Alcohol use No   Objective:   There were no vitals taken for this visit. There were no vitals filed for this visit.   Physical Exam  Constitutional: She is oriented to person, place, and time. She appears well-developed and well-nourished.  Cardiovascular: Normal rate and regular rhythm.   Pulmonary/Chest: Effort normal and breath sounds normal.  Neurological: She is alert and oriented to person, place, and time.  Skin: Skin is warm and dry. There is erythema.  1.5 cm diameter circular wound on anterior aspect of lower leg that is not weeping purulent fluid but does have area of surrounding erythema that trails inferiorly towards ankle.  Psychiatric: She has a normal mood and affect. Her behavior is normal.        Assessment & Plan:     1. Wound infection  Last Tdap 2007. Updated today. Please take abx as below and follow up x 1 week for recheck.   - doxycycline (VIBRA-TABS) 100 MG tablet; Take 1 tablet (100 mg total) by mouth 2 (two) times daily.  Dispense: 14 tablet; Refill: 0 - Td : Tetanus/diphtheria >7yo Preservative  free  Return in about 1 week (around 04/12/2017).  The entirety of the information documented in the History of Present Illness, Review of Systems and Physical Exam were personally obtained by  me. Portions of this information were initially documented by Ashley Royalty, CMA and reviewed by me for thoroughness and accuracy.        Trinna Post, PA-C  [facility]

## 2017-04-05 NOTE — Patient Instructions (Signed)
Wound Infection A wound infection happens when germs start to grow in the wound. Germs that cause wound infections are most often bacteria. Other types of infections can occur as well. In some cases, infection can cause the wound to break open. Wound infections need treatment. If a wound infection is not treated, complications can happen. Follow these instructions at home: Medicines  Take or apply over-the-counter and prescription medicines only as told by your doctor.  If you were prescribed antibiotic medicine, take or apply it as told by your doctor. Do not stop using the antibiotic even if your condition improves. Wound care  Clean the wound each day or as told by your doctor. ? Wash the wound with mild soap and water. ? Rinse the wound with water to remove all soap. ? Pat the wound dry with a clean towel. Do not rub it.  Follow instructions from your doctor about how to take care of your wound. Make sure you: ? Wash your hands with soap and water before you change your bandage (dressing). If you cannot use soap and water, use hand sanitizer. ? Change your bandage as told by your doctor. ? Leave stitches (sutures), skin glue, or skin tape (adhesive) strips in place if your wound has been closed. They may need to stay in place for 2 weeks or longer. If tape strips get loose and curl up, you may trim the loose edges. Do not remove tape strips completely unless your doctor says it is okay. Some wounds are left open to heal on their own.  Check your wound every day for signs of infection. Watch for: ? More redness, swelling, or pain. ? More fluid or blood. ? Warmth. ? Pus or a bad smell. General instructions  Keep the bandage dry until your doctor says it can be removed.  Do not take baths, swim, use a hot tub, or do anything that would put your wound underwater until your doctor says it is okay.  Raise (elevate) the injured area above the level of your heart while you are sitting or  lying down.  Do not scratch or pick at the wound.  Keep all follow-up visits as told by your doctor. This is important. Contact a doctor if:  Medicine does not help your pain.  You have more redness, swelling, or pain in the area of your wound.  You have more fluid or blood coming from your wound.  Your wound feels warm to the touch.  You have pus coming from your wound.  You continue to notice a bad smell coming from your wound or your bandage.  Your wound that was closed breaks open. Get help right away if:  You have a red streak going away from your wound.  You have a fever. This information is not intended to replace advice given to you by your health care provider. Make sure you discuss any questions you have with your health care provider. Document Released: 03/24/2008 Document Revised: 11/21/2015 Document Reviewed: 12/03/2014 Elsevier Interactive Patient Education  2018 [employer]vier Inc[employer]  

## 2017-04-12 ENCOUNTER — Encounter: Payer: Self-pay | Admitting: Physician Assistant

## 2017-04-12 ENCOUNTER — Ambulatory Visit (INDEPENDENT_AMBULATORY_CARE_PROVIDER_SITE_OTHER): Payer: PPO | Admitting: Physician Assistant

## 2017-04-12 VITALS — BP 112/64 | HR 72 | Temp 97.5°F | Resp 16 | Wt 134.0 lb

## 2017-04-12 DIAGNOSIS — T148XXA Other injury of unspecified body region, initial encounter: Secondary | ICD-10-CM | POA: Diagnosis not present

## 2017-04-12 DIAGNOSIS — L089 Local infection of the skin and subcutaneous tissue, unspecified: Secondary | ICD-10-CM | POA: Diagnosis not present

## 2017-04-12 MED ORDER — SULFAMETHOXAZOLE-TRIMETHOPRIM 800-160 MG PO TABS: 1.0000 | ORAL_TABLET | Freq: Two times a day (BID) | ORAL | 0 refills | Status: AC

## 2017-04-12 NOTE — Progress Notes (Signed)
Patient: Barbara Chambers Female    DOB: 1934/12/18   82 y.o.   MRN: [id number] Visit Date: 04/12/2017  Today's Provider: Trinna Post, PA-C   Chief Complaint  Patient presents with  . 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 She initially presented last week with 1 cm in diameter skin tear on left anterior shin. There was some surrounding erythema and slight erythema trailing inferiorly towards her ankle. She had no fevers, chills. She was updated on her tetanus shot and prescribed doxycycline 100 mg BID x 7 days. She has an anaphylactic allergy to PCN. She completed the course of doxycycline with modest improvement. She reports area is a bit less red but warmer to the touch. She herself continues to deny fever, chills, nausea, vomiting, or weakness of the extremity.  Wound Check  She was originally treated 10 to 14 days ago. Previous treatment included oral antibiotics. Her temperature was unmeasured prior to arrival. There has been no drainage from the wound. The redness has not changed (Redness comes and goes). Pain course: Only occasional sharpe pain. She has no difficulty moving the affected extremity or digit.       Allergies  Allergen Reactions  . Penicillins      Current Outpatient Prescriptions:  .  aspirin 81 MG tablet, Take by mouth., Disp: , Rfl:  .  COENZYME Q-10 PO, Take by mouth., Disp: , Rfl:  .  LORazepam (ATIVAN) 0.5 MG tablet, Take 1 tablet (0.5 mg total) by mouth every 8 (eight) hours., Disp: 60 tablet, Rfl: 0 .  losartan-hydrochlorothiazide (HYZAAR) 50-12.5 MG tablet, TAKE ONE TABLET BY MOUTH ONCE DAILY, Disp: 90 tablet, Rfl: 1 .  simvastatin (ZOCOR) 5 MG tablet, TAKE ONE TABLET BY MOUTH AT BEDTIME, Disp: 90 tablet, Rfl: 1 .  doxycycline (VIBRA-TABS) 100 MG tablet, Take 1 tablet (100 mg total) by mouth 2 (two) times daily.  (Patient not taking: Reported on 04/12/2017), Disp: 14 tablet, Rfl: 0  Review of Systems  Constitutional: Positive for fatigue. Negative for activity change, appetite change, chills, diaphoresis, fever and unexpected weight change.  Skin: Positive for wound. Negative for color change, pallor and rash.    Social History  Substance Use Topics  . Smoking status: Never Smoker  . Smokeless tobacco: Never Used  . Alcohol use No   Objective:   BP 112/64 (BP Location: Left Arm, Patient Position: Sitting, Cuff Size: Normal)   Pulse 72   Temp (!) 97.5 F (36.4 C) (Oral)   Resp 16   Wt 134 lb (60.8 kg)   BMI 25.32 kg/m  Vitals:   04/12/17 1333  BP: 112/64  Pulse: 72  Resp: 16  Temp: (!) 97.5 F (36.4 C)  TempSrc: Oral  Weight: 134 lb (60.8 kg)     Physical Exam  Constitutional: She is oriented to person, place, and time. She appears well-developed.  Cardiovascular: Normal rate.   Pulmonary/Chest: Effort normal.  Neurological: She is alert and oriented to person, place, and time.  Skin: Skin is warm and dry. There is erythema.  Stable skin flap wound on left anterior shin. Less sever erythema surrounding the wound, though it seems to have expanded more inferiorly than last time. Slightly warm to touch.   Psychiatric: She has a normal mood and affect. Her behavior  is normal.  Vitals reviewed.       Assessment & Plan:     1. Wound infection  Not progressing as much as it should. Will try Bactrim 2/2 PCN allergy. Please follow up with Dr. B next week for wound check. Please come in earlier if worsening.  - sulfamethoxazole-trimethoprim (BACTRIM DS) 800-160 MG tablet; Take 1 tablet by mouth 2 (two) times daily.  Dispense: 14 tablet; Refill: 0  Return in about 1 week (around 04/19/2017) for wound infection.  The entirety of the information documented in the History of Present Illness, Review of Systems and Physical Exam were personally obtained by me. Portions of this  information were initially documented by Barbara Chambers, CMA and reviewed by me for thoroughness and accuracy.        Barbara Post, PA-C  [facility]

## 2017-04-12 NOTE — Patient Instructions (Signed)
Wound Infection A wound infection happens when germs start to grow in the wound. Germs that cause wound infections are most often bacteria. Other types of infections can occur as well. In some cases, infection can cause the wound to break open. Wound infections need treatment. If a wound infection is not treated, complications can happen. Follow these instructions at home: Medicines  Take or apply over-the-counter and prescription medicines only as told by your doctor.  If you were prescribed antibiotic medicine, take or apply it as told by your doctor. Do not stop using the antibiotic even if your condition improves. Wound care  Clean the wound each day or as told by your doctor. ? Wash the wound with mild soap and water. ? Rinse the wound with water to remove all soap. ? Pat the wound dry with a clean towel. Do not rub it.  Follow instructions from your doctor about how to take care of your wound. Make sure you: ? Wash your hands with soap and water before you change your bandage (dressing). If you cannot use soap and water, use hand sanitizer. ? Change your bandage as told by your doctor. ? Leave stitches (sutures), skin glue, or skin tape (adhesive) strips in place if your wound has been closed. They may need to stay in place for 2 weeks or longer. If tape strips get loose and curl up, you may trim the loose edges. Do not remove tape strips completely unless your doctor says it is okay. Some wounds are left open to heal on their own.  Check your wound every day for signs of infection. Watch for: ? More redness, swelling, or pain. ? More fluid or blood. ? Warmth. ? Pus or a bad smell. General instructions  Keep the bandage dry until your doctor says it can be removed.  Do not take baths, swim, use a hot tub, or do anything that would put your wound underwater until your doctor says it is okay.  Raise (elevate) the injured area above the level of your heart while you are sitting or  lying down.  Do not scratch or pick at the wound.  Keep all follow-up visits as told by your doctor. This is important. Contact a doctor if:  Medicine does not help your pain.  You have more redness, swelling, or pain in the area of your wound.  You have more fluid or blood coming from your wound.  Your wound feels warm to the touch.  You have pus coming from your wound.  You continue to notice a bad smell coming from your wound or your bandage.  Your wound that was closed breaks open. Get help right away if:  You have a red streak going away from your wound.  You have a fever. This information is not intended to replace advice given to you by your health care provider. Make sure you discuss any questions you have with your health care provider. Document Released: 03/24/2008 Document Revised: 11/21/2015 Document Reviewed: 12/03/2014 Elsevier Interactive Patient Education  2018 [employer]vier Inc[employer]  

## 2017-04-19 ENCOUNTER — Ambulatory Visit
Admission: RE | Admit: 2017-04-19 | Discharge: 2017-04-19 | Disposition: A | Discharge status: Home / Self Care | Payer: PPO | Source: Ambulatory Visit | Attending: Physician Assistant | Admitting: Physician Assistant

## 2017-04-19 DIAGNOSIS — Z1231 Encounter for screening mammogram for malignant neoplasm of breast: Secondary | ICD-10-CM | POA: Diagnosis not present

## 2017-04-20 ENCOUNTER — Ambulatory Visit (INDEPENDENT_AMBULATORY_CARE_PROVIDER_SITE_OTHER): Payer: PPO | Admitting: Family Medicine

## 2017-04-20 VITALS — BP 108/60 | HR 80 | Temp 97.6°F | Resp 16 | Wt 135.0 lb

## 2017-04-20 DIAGNOSIS — Z23 Encounter for immunization: Secondary | ICD-10-CM | POA: Diagnosis not present

## 2017-04-20 DIAGNOSIS — L0390 Cellulitis, unspecified: Secondary | ICD-10-CM | POA: Diagnosis not present

## 2017-04-20 DIAGNOSIS — C9190 Lymphoid leukemia, unspecified not having achieved remission: Secondary | ICD-10-CM | POA: Diagnosis not present

## 2017-04-20 DIAGNOSIS — R252 Cramp and spasm: Secondary | ICD-10-CM | POA: Diagnosis not present

## 2017-04-20 DIAGNOSIS — C9110 Chronic lymphocytic leukemia of B-cell type not having achieved remission: Secondary | ICD-10-CM

## 2017-04-20 DIAGNOSIS — I10 Essential (primary) hypertension: Secondary | ICD-10-CM

## 2017-04-20 MED ORDER — LOSARTAN POTASSIUM 50 MG PO TABS: 50.0000 mg | ORAL_TABLET | Freq: Every day | ORAL | 3 refills | Status: DC

## 2017-04-20 NOTE — Addendum Note (Signed)
Addended by: Brennan Bailey on: 04/20/2017 09:38 AM   Modules accepted: Orders

## 2017-04-20 NOTE — Assessment & Plan Note (Signed)
Could be related to electrolyte deficiency given improvement with mustard This could be related to HCTZ - discontinue as above for cramping and hypotension Check CMP, Magnesium

## 2017-04-20 NOTE — Assessment & Plan Note (Signed)
Recheck CBC as patient is experiencing worsening fatigue WBC may be slightly elevated from baseline given recent infection

## 2017-04-20 NOTE — Assessment & Plan Note (Signed)
At goal, but running on low side and patient is symptomatic D/c HCTZ due to leg cramps as well as hypotension Continue losartan 50mg  daily Call in 2 weeks to report BPs F/u at next scheduled appt

## 2017-04-20 NOTE — Progress Notes (Signed)
Patient: Barbara Chambers Female    DOB: [birth year]   82 y.o.   MRN: [id number] Visit Date: 04/20/2017  Today's Provider: Lavon Paganini, MD   Chief Complaint  Patient presents with  . Wound Check   Subjective:    HPI     Follow up for Wound Infection  The patient was last seen for this 1 week ago. The wound is located on her LLE. Changes made at last visit include adding Bactrim. She was initially started on Doxy for the infection on 04/05/2017.  She reports good compliance with treatment. She feels that condition is Improved. She is not having side effects.   ------------------------------------------------------------------------------------ Barbara Chambers is also concerned about hypotension. She states her DBP can be as low as 46. This was low last week for 3 days. She states when it is low, she "feels draggy". She denies syncope and presyncope. She is c/o decreased energy.  Taking losartan-hctz daily, but had to skip a few times due to low BP.    She also states she has nocturnal leg cramps in bilateral calves, "that takes my breath away".  Have been going on for years.  States that she had always contributed them to her statin.  States that it is worse now and more frequent.  Spoon full of mustard seemed to help the cramping.  Allergies  Allergen Reactions  . Penicillins      Current Outpatient Prescriptions:  .  aspirin 81 MG tablet, Take by mouth., Disp: , Rfl:  .  COENZYME Q-10 PO, Take by mouth., Disp: , Rfl:  .  losartan-hydrochlorothiazide (HYZAAR) 50-12.5 MG tablet, TAKE ONE TABLET BY MOUTH ONCE DAILY, Disp: 90 tablet, Rfl: 1 .  simvastatin (ZOCOR) 5 MG tablet, TAKE ONE TABLET BY MOUTH AT BEDTIME, Disp: 90 tablet, Rfl: 1  Review of Systems  Constitutional: Positive for activity change and fatigue. Negative for appetite change, chills, diaphoresis, fever and unexpected weight change.  Cardiovascular: Negative for chest pain, palpitations and leg swelling.    Skin: Positive for wound (improved).    Social History  Substance Use Topics  . Smoking status: Never Smoker  . Smokeless tobacco: Never Used  . Alcohol use No   Objective:   BP 108/60 (BP Location: Left Arm, Patient Position: Sitting, Cuff Size: Normal)   Pulse 80   Temp 97.6 F (36.4 C) (Oral)   Resp 16   Wt 135 lb (61.2 kg)   BMI 25.51 kg/m  Vitals:   04/20/17 0810  BP: 108/60  Pulse: 80  Resp: 16  Temp: 97.6 F (36.4 C)  TempSrc: Oral  Weight: 135 lb (61.2 kg)     Physical Exam  Constitutional: She is oriented to person, place, and time. She appears well-developed and well-nourished. No distress.  HENT:  Head: Normocephalic and atraumatic.  Eyes: Conjunctivae are normal. No scleral icterus.  Cardiovascular: Normal rate, regular rhythm, normal heart sounds and intact distal pulses.   No murmur heard. Pulmonary/Chest: Effort normal and breath sounds normal. No respiratory distress. She has no wheezes. She has no rales.  Musculoskeletal: She exhibits no edema or deformity.  Neurological: She is alert and oriented to person, place, and time.  Skin: Skin is warm and dry. No rash noted.  Small scabbed over wound on l anterior shin, no surrounding erythema  Vitals reviewed.       Assessment & Plan:      Problem List Items Addressed This Visit  Cardiovascular and Mediastinum   Benign essential HTN    At goal, but running on low side and patient is symptomatic D/c HCTZ due to leg cramps as well as hypotension Continue losartan 50mg  daily Call in 2 weeks to report BPs F/u at next scheduled appt      Relevant Medications   losartan (COZAAR) 50 MG tablet   Other Relevant Orders   Comprehensive metabolic panel     Other   Chronic lymphatic leukemia ([place]) - Primary    Recheck CBC as patient is experiencing worsening fatigue WBC may be slightly elevated from baseline given recent infection      Relevant Orders   CBC w/Diff/Platelet   Leg cramps     Could be related to electrolyte deficiency given improvement with mustard This could be related to HCTZ - discontinue as above for cramping and hypotension Check CMP, Magnesium      Relevant Orders   Comprehensive metabolic panel   Magnesium    Other Visit Diagnoses    Wound cellulitis         - wound cellulitis resolved s/p Bactrim - continue to monitor for appropriate healing - apply vaseline BID to help with healing      The entirety of the information documented in the History of Present Illness, Review of Systems and Physical Exam were personally obtained by me. Portions of this information were initially documented by Raquel Sarna Ratchford, CMA and reviewed by me for thoroughness and accuracy.     Lavon Paganini, MD  [facility]

## 2017-04-20 NOTE — Patient Instructions (Addendum)
Switch blood pressure pill to losartan only  We will check labs for leg cramps  Call in 2-3 weeks to let us know how blood pressures have been

## 2017-04-21 ENCOUNTER — Telehealth: Payer: Self-pay

## 2017-04-21 LAB — CBC WITH DIFFERENTIAL/PLATELET
BASOS ABS: 75 {cells}/uL (ref 0–200)
Basophils Relative: 0.5 %
EOS ABS: 75 {cells}/uL (ref 15–500)
Eosinophils Relative: 0.5 %
HEMATOCRIT: 36 % (ref 35.0–45.0)
Hemoglobin: 11.9 g/dL (ref 11.7–15.5)
Lymphs Abs: 9630 cells/uL — ABNORMAL HIGH (ref 850–3900)
MCH: 27.3 pg (ref 27.0–33.0)
MCHC: 33.1 g/dL (ref 32.0–36.0)
MCV: 82.6 fL (ref 80.0–100.0)
MONOS PCT: 6 %
MPV: 8.8 fL (ref 7.5–12.5)
NEUTROS PCT: 28.8 %
Neutro Abs: 4320 cells/uL (ref 1500–7800)
PLATELETS: 357 10*3/uL (ref 140–400)
RBC: 4.36 10*6/uL (ref 3.80–5.10)
RDW: 12.9 % (ref 11.0–15.0)
TOTAL LYMPHOCYTE: 64.2 %
WBC mixed population: 900 cells/uL (ref 200–950)
WBC: 15 10*3/uL — ABNORMAL HIGH (ref 3.8–10.8)

## 2017-04-21 LAB — COMPLETE METABOLIC PANEL WITH GFR
AG Ratio: 1.9 (calc) (ref 1.0–2.5)
ALT: 14 U/L (ref 6–29)
AST: 20 U/L (ref 10–35)
Albumin: 3.8 g/dL (ref 3.6–5.1)
Alkaline phosphatase (APISO): 83 U/L (ref 33–130)
BILIRUBIN TOTAL: 0.3 mg/dL (ref 0.2–1.2)
BUN: 11 mg/dL (ref 7–25)
CALCIUM: 8.7 mg/dL (ref 8.6–10.4)
CHLORIDE: 101 mmol/L (ref 98–110)
CO2: 29 mmol/L (ref 20–32)
CREATININE: 0.78 mg/dL (ref 0.60–0.88)
GFR, EST AFRICAN AMERICAN: 83 mL/min/{1.73_m2} (ref >=60)
GFR, Est Non African American: 72 mL/min/{1.73_m2} (ref >=60)
Globulin: 2 g/dL (calc) (ref 1.9–3.7)
Glucose, Bld: 94 mg/dL (ref 65–139)
Potassium: 4.3 mmol/L (ref 3.5–5.3)
Sodium: 135 mmol/L (ref 135–146)
TOTAL PROTEIN: 5.8 g/dL — AB (ref 6.1–8.1)

## 2017-04-21 LAB — MAGNESIUM: Magnesium: 2.1 mg/dL (ref 1.5–2.5)

## 2017-04-21 NOTE — Telephone Encounter (Signed)
Pt advised and acknowledges understanding.  

## 2017-04-21 NOTE — Telephone Encounter (Signed)
-----   Message from [name], MD sent at 04/21/2017  8:58 AM EDT ----- Stable white blood cell count, which is even slightly better than 5 months ago. Other blood counts normal. Normal kidney function, liver function, electrolytes. No electrolyte disturbance found that is causing leg cramping. Stopping the HCTZ, as we did yesterday, May help with the leg cramps though.  [name], MD, MPH [facility] 04/21/2017 8:58 AM

## 2017-05-06 ENCOUNTER — Telehealth: Payer: Self-pay

## 2017-05-06 NOTE — Telephone Encounter (Signed)
Patient called to let Dr. Sharmaine Base nurse know her how her BP has been doing. She reports the lowest it has been is 106/54 and the highest it has been is 161/81. She still has occasional dizzy spells, feeling light headed, and her ankles have been swollen. She has been off of the HCTZ for about 3 weeks and Dr. B only gave her a 30 day supply of the Losartan. Should she continue her current dose or change to something different? Please advise. Thanks!

## 2017-05-06 NOTE — Telephone Encounter (Signed)
Please advise 

## 2017-05-07 MED ORDER — LOSARTAN POTASSIUM 50 MG PO TABS: 50.0000 mg | ORAL_TABLET | Freq: Every day | ORAL | 3 refills | Status: DC

## 2017-05-07 NOTE — Telephone Encounter (Signed)
Sounds like BP is under better control.  OK to continue Losartan at current dose.  OK to send in 90 day supply with 1 refill.  If legs continue to swell, should have earlier f/u.  [name], MD, MPH [facility] 05/07/2017 9:22 AM

## 2017-05-07 NOTE — Telephone Encounter (Signed)
Advised patient as below. Sent in a 30 day supply per patient request into [facility] in [place]. Patient will call to schedule appt if legs and ankles continue to swell.

## 2017-05-17 ENCOUNTER — Ambulatory Visit: Payer: PPO | Admitting: Family Medicine

## 2017-05-17 VITALS — Temp 97.5°F | Resp 16 | Wt 136.0 lb

## 2017-05-17 DIAGNOSIS — R8290 Unspecified abnormal findings in urine: Secondary | ICD-10-CM

## 2017-05-17 DIAGNOSIS — R42 Dizziness and giddiness: Secondary | ICD-10-CM | POA: Diagnosis not present

## 2017-05-17 DIAGNOSIS — R252 Cramp and spasm: Secondary | ICD-10-CM | POA: Diagnosis not present

## 2017-05-17 DIAGNOSIS — I10 Essential (primary) hypertension: Secondary | ICD-10-CM

## 2017-05-17 DIAGNOSIS — N390 Urinary tract infection, site not specified: Secondary | ICD-10-CM | POA: Diagnosis not present

## 2017-05-17 LAB — POCT URINALYSIS DIPSTICK
Bilirubin, UA: NEGATIVE
GLUCOSE UA: NEGATIVE
KETONES UA: NEGATIVE
Nitrite, UA: POSITIVE
PROTEIN UA: NEGATIVE
RBC UA: NEGATIVE
SPEC GRAV UA: 1.01 (ref 1.010–1.025)
Urobilinogen, UA: 0.2 E.U./dL
pH, UA: 7 (ref 5.0–8.0)

## 2017-05-17 MED ORDER — LOSARTAN POTASSIUM-HCTZ 100-12.5 MG PO TABS
0.5000 | ORAL_TABLET | Freq: Every day | ORAL | 3 refills | Status: DC
Start: 2017-05-17 — End: 2018-01-05

## 2017-05-17 MED ORDER — CEPHALEXIN 500 MG PO CAPS: 500.0000 mg | ORAL_CAPSULE | Freq: Two times a day (BID) | ORAL | 0 refills | Status: AC

## 2017-05-17 NOTE — Assessment & Plan Note (Signed)
UA convincing for UTI Clinically no pyelo Send urine culture for confirmation and sensitivities Given recent treatment with Bactrim for cellulitis, will treat with Keflex Patient does have allergy to penicillin (no anaphylaxis), so warned about 1% cross reactivity Return precautions discussed

## 2017-05-17 NOTE — Progress Notes (Signed)
Patient: Barbara Chambers Female    DOB: Jul 24, 1934   82 y.o.   MRN: [id number] Visit Date: 05/18/2017  Today's Provider: Lavon Paganini, MD   Chief Complaint  Patient presents with  . Dizziness   Subjective:    Dizziness  This is a new (pt states she has noticed dizziness since LOV when Losartan-HCTZ 50-12.5 was changed to Losartan 50 mg; pt also states she noticed leg swelling since med change) problem. The problem has been unchanged. Associated symptoms include coughing (and rhinorrhea), fatigue, headaches and urinary symptoms. Pertinent negatives include no abdominal pain, anorexia, change in bowel habit, chest pain, chills, diaphoresis, fever, nausea, sore throat, visual change, vomiting or weakness. Exacerbated by: position change.  After HCTZ was stopped at last visit, patient began to notice the dizziness and swelling.  She has not had any SOB.  She states that leg cramps are significantly improved, but she would rather have leg cramps than swelling. She wants to resume HCTZ.  Pt also states she believes she has a urinary infection. Her urine is discolored and malodorous. She would like a UA to be checked today. +Dysuria, urinary freq, urgency.  Recent Bactrim for cellulitis.     Allergies  Allergen Reactions  . Penicillins Rash     Current Outpatient Medications:  .  aspirin 81 MG tablet, Take by mouth., Disp: , Rfl:  .  COENZYME Q-10 PO, Take by mouth., Disp: , Rfl:  .  losartan (COZAAR) 50 MG tablet, Take 1 tablet (50 mg total) daily by mouth., Disp: 30 tablet, Rfl: 3 .  simvastatin (ZOCOR) 5 MG tablet, TAKE ONE TABLET BY MOUTH AT BEDTIME, Disp: 90 tablet, Rfl: 1 .  cephALEXin (KEFLEX) 500 MG capsule, Take 1 capsule (500 mg total) 2 (two) times daily for 5 days by mouth., Disp: 10 capsule, Rfl: 0 .  losartan-hydrochlorothiazide (HYZAAR) 100-12.5 MG tablet, Take 0.5 tablets daily by mouth., Disp: 45 tablet, Rfl: 3  Review of Systems  Constitutional: Positive for  fatigue. Negative for chills, diaphoresis and fever.  HENT: Negative for sore throat.   Respiratory: Positive for cough (and rhinorrhea).   Cardiovascular: Negative for chest pain.  Gastrointestinal: Negative for abdominal pain, anorexia, change in bowel habit, nausea and vomiting.  Neurological: Positive for dizziness and headaches. Negative for weakness.    Social History   Tobacco Use  . Smoking status: Never Smoker  . Smokeless tobacco: Never Used  Substance Use Topics  . Alcohol use: No   Objective:   Temp (!) 97.5 F (36.4 C) (Oral)   Resp 16   Wt 136 lb (61.7 kg)   BMI 25.70 kg/m  Vitals:   05/17/17 1535  Resp: 16  Temp: (!) 97.5 F (36.4 C)  TempSrc: Oral  Weight: 136 lb (61.7 kg)   No data found.    Physical Exam  Constitutional: She is oriented to person, place, and time. She appears well-developed and well-nourished. No distress.  HENT:  Head: Normocephalic and atraumatic.  Nose: Nose normal.  Mouth/Throat: Oropharynx is clear and moist.  Eyes: Conjunctivae are normal. No scleral icterus.  Neck: Neck supple. No thyromegaly present.  Cardiovascular: Normal rate, regular rhythm, normal heart sounds and intact distal pulses.  No murmur heard. Pulmonary/Chest: Breath sounds normal. No respiratory distress. She has no wheezes. She has no rales.  Abdominal: Soft. Bowel sounds are normal. She exhibits no distension. There is no tenderness. There is no rebound and no guarding.  Musculoskeletal: She exhibits no  edema or deformity.  Gait intact, strength and sensation to light touch diffusely intact  Lymphadenopathy:    She has no cervical adenopathy.  Neurological: She is alert and oriented to person, place, and time. No cranial nerve deficit.  Skin: Skin is warm and dry. No rash noted.  Psychiatric: She has a normal mood and affect. Her behavior is normal.  Vitals reviewed.    Results for orders placed or performed in visit on 05/17/17  POCT urinalysis  dipstick  Result Value Ref Range   Color, UA yellow    Clarity, UA cloudy    Glucose, UA Negative    Bilirubin, UA Negative    Ketones, UA Negative    Spec Grav, UA 1.010 1.010 - 1.025   Blood, UA Negative    pH, UA 7.0 5.0 - 8.0   Protein, UA Negative    Urobilinogen, UA 0.2 0.2 or 1.0 E.U./dL   Nitrite, UA Positive    Leukocytes, UA Moderate (2+) (A) Negative       Assessment & Plan:      Problem List Items Addressed This Visit      Cardiovascular and Mediastinum   Benign essential HTN    At goal without orthostasis It is possible that she is occasionally dropping low and feeling symptomatic Despite great improvement in leg cramps, patient prefers to resume HCTZ Resume small dose HCTZ Will trial Losartan-HCTZ 50-6.25mg  daily May need to decrease losartan dose in future F/u in 1 month      Relevant Medications   losartan-hydrochlorothiazide (HYZAAR) 100-12.5 MG tablet     Genitourinary   Acute lower UTI - Primary    UA convincing for UTI Clinically no pyelo Send urine culture for confirmation and sensitivities Given recent treatment with Bactrim for cellulitis, will treat with Keflex Patient does have allergy to penicillin (no anaphylaxis), so warned about 1% cross reactivity Return precautions discussed      Relevant Medications   cephALEXin (KEFLEX) 500 MG capsule   Other Relevant Orders   Urine Culture     Other   Leg cramps    Much improved since d/c HCTZ, but patient wishes to resume for leg swelling as above      Dizziness    Suspect this is related to BP as above Changes made and f/u in 1 month Normal neuro and cardiac exam and reassuring description       Other Visit Diagnoses    Malodorous urine       Relevant Orders   POCT urinalysis dipstick (Completed)       Return in about 4 weeks (around 06/14/2017).     The entirety of the information documented in the History of Present Illness, Review of Systems and Physical Exam were  personally obtained by me. Portions of this information were initially documented by Raquel Sarna Ratchford, CMA and reviewed by me for thoroughness and accuracy.     Lavon Paganini, MD  [facility]

## 2017-05-17 NOTE — Patient Instructions (Signed)

## 2017-05-18 DIAGNOSIS — R42 Dizziness and giddiness: Secondary | ICD-10-CM | POA: Insufficient documentation

## 2017-05-18 NOTE — Assessment & Plan Note (Signed)
Suspect this is related to BP as above Changes made and f/u in 1 month Normal neuro and cardiac exam and reassuring description

## 2017-05-18 NOTE — Assessment & Plan Note (Signed)
Much improved since d/c HCTZ, but patient wishes to resume for leg swelling as above

## 2017-05-18 NOTE — Assessment & Plan Note (Signed)
At goal without orthostasis It is possible that she is occasionally dropping low and feeling symptomatic Despite great improvement in leg cramps, patient prefers to resume HCTZ Resume small dose HCTZ Will trial Losartan-HCTZ 50-6.25mg  daily May need to decrease losartan dose in future F/u in 1 month

## 2017-05-20 LAB — URINE CULTURE
MICRO NUMBER: 81303999
SPECIMEN QUALITY: ADEQUATE

## 2017-05-21 ENCOUNTER — Telehealth: Payer: Self-pay

## 2017-05-21 NOTE — Telephone Encounter (Signed)
-----   Message from [name], MD sent at 05/21/2017  8:23 AM EST ----- Urine culture shows E Coli UTI that is sensitive to antibiotic prescribed.  Patient should fell improved with finishing antibiotic course  [name] [name], MD, MPH [facility] 05/21/2017 8:23 AM

## 2017-05-21 NOTE — Telephone Encounter (Signed)
Pt advised.

## 2017-07-08 ENCOUNTER — Ambulatory Visit (INDEPENDENT_AMBULATORY_CARE_PROVIDER_SITE_OTHER): Payer: PPO

## 2017-07-08 ENCOUNTER — Encounter: Payer: Self-pay | Admitting: Family Medicine

## 2017-07-08 ENCOUNTER — Ambulatory Visit (INDEPENDENT_AMBULATORY_CARE_PROVIDER_SITE_OTHER): Payer: PPO | Admitting: Family Medicine

## 2017-07-08 VITALS — BP 140/82 | HR 84 | Temp 98.0°F | Ht 61.0 in | Wt 139.4 lb

## 2017-07-08 VITALS — BP 132/76 | HR 80 | Temp 98.0°F | Resp 16 | Ht 61.0 in | Wt 139.0 lb

## 2017-07-08 DIAGNOSIS — E785 Hyperlipidemia, unspecified: Secondary | ICD-10-CM

## 2017-07-08 DIAGNOSIS — E039 Hypothyroidism, unspecified: Secondary | ICD-10-CM

## 2017-07-08 DIAGNOSIS — J069 Acute upper respiratory infection, unspecified: Secondary | ICD-10-CM

## 2017-07-08 DIAGNOSIS — Z0000 Encounter for general adult medical examination without abnormal findings: Secondary | ICD-10-CM

## 2017-07-08 DIAGNOSIS — I10 Essential (primary) hypertension: Secondary | ICD-10-CM | POA: Diagnosis not present

## 2017-07-08 DIAGNOSIS — E038 Other specified hypothyroidism: Secondary | ICD-10-CM

## 2017-07-08 MED ORDER — FLUTICASONE PROPIONATE 50 MCG/ACT NA SUSP: 2.0000 | Freq: Every day | NASAL | 6 refills | Status: DC

## 2017-07-08 NOTE — Assessment & Plan Note (Signed)
Recheck lipid panel Continue simvastatin

## 2017-07-08 NOTE — Progress Notes (Signed)
Patient: Barbara Chambers, Female    DOB: 07-07-1934, 83 y.o.   MRN: [id number] Visit Date: 07/08/2017  Today's Provider: Lavon Paganini, MD   I, Martha Clan, CMA, am acting as scribe for Lavon Paganini, MD.  Chief Complaint  Patient presents with  . Annual Exam  . Sinus Problem   Subjective:    Annual physical exam Barbara Chambers is a 83 y.o. female who presents today for health maintenance and complete physical. She feels fairly well. She reports she walks about 3 miles per day when the weather permits.. She reports she is sleeping well.  Last mammogram- 04/19/2017- BI-RADS 1 Last BMD- 04/13/2013- Normal ----------------------------------------------------------------- Sinus Problem Pt reports she has been experiencing sinus problems x 6 days. She is c/o sinus pain/pressure, sore throat, nasal congestion, fatigue, post-nasal drainage which is causing cough. She is also c/o some left otalgia. Denies fever, chills, sweats. She has tried OTC nasal spray, Zicam, OTC Cold medication, with moderate relief.  Review of Systems  Constitutional: Negative.   HENT: Positive for rhinorrhea, sinus pressure, sinus pain and sore throat. Negative for congestion, dental problem, drooling, ear discharge, ear pain, facial swelling, hearing loss, mouth sores, nosebleeds, postnasal drip, sneezing, tinnitus, trouble swallowing and voice change.   Eyes: Negative.   Respiratory: Positive for cough. Negative for apnea, choking, chest tightness, shortness of breath, wheezing and stridor.   Cardiovascular: Negative.   Gastrointestinal: Negative.   Endocrine: Negative.   Genitourinary: Negative.   Musculoskeletal: Positive for back pain. Negative for arthralgias, gait problem, joint swelling, myalgias, neck pain and neck stiffness.  Skin: Negative.   Allergic/Immunologic: Negative.   Neurological: Negative.   Hematological: Negative.   Psychiatric/Behavioral: Negative.     Social  History      She  reports that  has never smoked. she has never used smokeless tobacco. She reports that she does not drink alcohol or use drugs.       Social History   Socioeconomic History  . Marital status: Widowed    Spouse name: Casandra Doffing  . Number of children: 3  . Years of education: None  . Highest education level: 12th grade  Social Needs  . Financial resource strain: Not hard at all  . Food insecurity - worry: Never true  . Food insecurity - inability: Never true  . Transportation needs - medical: No  . Transportation needs - non-medical: No  Occupational History  . Occupation: retired  Tobacco Use  . Smoking status: Never Smoker  . Smokeless tobacco: Never Used  Substance and Sexual Activity  . Alcohol use: No  . Drug use: No  . Sexual activity: None  Other Topics Concern  . None  Social History Narrative   Pt has 1 son that was killed in a car accident at age 24.     Past Medical History:  Diagnosis Date  . Cancer [facility]) 2011   lymphoma  . Hyperlipidemia   . Hypertension      Patient Active Problem List   Diagnosis Date Noted  . Dizziness 05/18/2017  . Acute lower UTI 05/17/2017  . Leg cramps 04/20/2017  . Low back pain 03/08/2015  . Arthritis 03/08/2015  . Anxiety 10/25/2014  . Chronic lymphatic leukemia ([place]) 10/25/2014  . HLD (hyperlipidemia) 10/25/2014  . Subclinical hypothyroidism 10/25/2014  . Abnormal ECG 10/11/2014  . Benign essential HTN 10/11/2014    Past Surgical History:  Procedure Laterality Date  . cataract surgery Bilateral 2005  . CHOLECYSTECTOMY  [place] History        Family Status  Relation Name Status  . Mother  Deceased at age 95  . Father  Deceased  . Brother  Alive  . Son  Alive  . Son  Deceased       killed in a car accident  . Son  Alive  . Neg Hx  (Not Specified)        Her family history includes CAD in her mother; Heart attack in her father; Hypertension in her brother, father, mother, and son;  Seizures in her brother; Stroke in her mother.     Allergies  Allergen Reactions  . Penicillins Rash     Current Outpatient Medications:  .  aspirin 81 MG tablet, Take 81 mg by mouth daily. , Disp: , Rfl:  .  COENZYME Q-10 PO, Take by mouth daily. , Disp: , Rfl:  .  losartan-hydrochlorothiazide (HYZAAR) 100-12.5 MG tablet, Take 0.5 tablets daily by mouth., Disp: 45 tablet, Rfl: 3 .  simvastatin (ZOCOR) 5 MG tablet, TAKE ONE TABLET BY MOUTH AT BEDTIME, Disp: 90 tablet, Rfl: 1   Patient Care Team: [name], MD as PCP - General (Family Medicine) Lloyd Huger, MD as Consulting Physician (Oncology) Corey Skains, MD as Consulting Physician (Cardiology) Brendolyn Patty, MD as Consulting Physician (Dermatology)      Objective:   Vitals: BP 132/76 (BP Location: Left Arm, Patient Position: Sitting, Cuff Size: Normal)   Pulse 80   Temp 98 F (36.7 C)   Resp 16   Ht 5\' 1"  (1.549 m)   Wt 139 lb (63 kg)   SpO2 99%   BMI 26.26 kg/m    Vitals:   07/08/17 1001  BP: 132/76  Pulse: 80  Resp: 16  Temp: 98 F (36.7 C)  SpO2: 99%  Weight: 139 lb (63 kg)  Height: 5\' 1"  (1.549 m)     Physical Exam  Constitutional: She is oriented to person, place, and time. She appears well-developed and well-nourished. No distress.  HENT:  Head: Normocephalic and atraumatic.  Right Ear: External ear normal.  Left Ear: External ear normal.  Nose: Mucosal edema present. Right sinus exhibits no maxillary sinus tenderness and no frontal sinus tenderness. Left sinus exhibits no maxillary sinus tenderness and no frontal sinus tenderness.  Mouth/Throat: Mucous membranes are normal. Posterior oropharyngeal erythema present. No posterior oropharyngeal edema.  Nasal turbinates inflammed  Eyes: Conjunctivae and EOM are normal. Pupils are equal, round, and reactive to light. No scleral icterus.  Neck: Neck supple. No thyromegaly present.  Cardiovascular: Normal rate, regular rhythm, normal  heart sounds and intact distal pulses.  No murmur heard. Pulmonary/Chest: Effort normal and breath sounds normal. No respiratory distress. She has no wheezes. She has no rales.  Abdominal: Soft. Bowel sounds are normal. She exhibits no distension. There is no tenderness. There is no rebound and no guarding.  Musculoskeletal: She exhibits no edema or deformity.  Lymphadenopathy:    She has no cervical adenopathy.  Neurological: She is alert and oriented to person, place, and time.  Skin: Skin is warm and dry. No rash noted.  Psychiatric: She has a normal mood and affect. Her behavior is normal.  Vitals reviewed.    Depression Screen PHQ 2/9 Scores 07/08/2017 07/08/2017 06/24/2016 12/26/2014  PHQ - 2 Score 0 0 1 0  PHQ- 9 Score 0 - - -    Audit-C Alcohol Use Screening  Question Answer Points  How  often do you have alcoholic drink? never 0  On days you do drink alcohol, how many drinks do you typically consume? never 0  How oftey will you drink 6 or more in a total? never 0  Total Score:  0   A score of 3 or more in women, and 4 or more in men indicates increased risk for alcohol abuse, EXCEPT if all of the points are from question 1.    Assessment & Plan:     Routine Health Maintenance and Physical Exam  Exercise Activities and Dietary recommendations Goals    . Exercise     Pt plans to start back walking 5 days a week for 1 hour (3 miles).        Immunization History  Administered Date(s) Administered  . Influenza, High Dose Seasonal PF 06/24/2016, 04/20/2017  . Pneumococcal Conjugate-13 03/30/2014  . Pneumococcal Polysaccharide-23 04/20/2017  . Td 04/05/2017  . Tdap 07/14/2005    Health Maintenance  Topic Date Due  . TETANUS/TDAP  04/06/2027  . INFLUENZA VACCINE  Completed  . DEXA SCAN  Completed  . PNA vac Low Risk Adult  Completed     Discussed health benefits of physical activity, and encouraged her to engage in regular exercise appropriate for her age  and condition.    Problem List Items Addressed This Visit      Cardiovascular and Mediastinum   Benign essential HTN    Well controlled Continue current meds F/u in 6 months        Endocrine   Subclinical hypothyroidism    Asymptomatic Recheck TSH and free T4      Relevant Orders   TSH   T4, free     Other   HLD (hyperlipidemia)    Recheck lipid panel Continue simvastatin      Relevant Orders   Lipid Profile    Other Visit Diagnoses    Encounter for annual physical exam    -  Primary   Viral URI        - discussed symptomatic management, return precautions and natural course - to call back at 10 day mark if not improved and will give abx for presumed 2ndary bacterial sinusitis  -------------------------------------------------------------------- Return in about 6 months (around 01/05/2018) for chronic disease f/u.   The entirety of the information documented in the History of Present Illness, Review of Systems and Physical Exam were personally obtained by me. Portions of this information were initially documented by Raquel Sarna Ratchford, CMA and reviewed by me for thoroughness and accuracy.    [name], MD, MPH [facility] 07/08/2017 12:09 PM

## 2017-07-08 NOTE — Assessment & Plan Note (Signed)
Asymptomatic Recheck TSH and free T4

## 2017-07-08 NOTE — Assessment & Plan Note (Signed)
Well controlled Continue current meds F/u in 6 months 

## 2017-07-08 NOTE — Progress Notes (Signed)
Subjective:   Barbara Chambers is a 83 y.o. female who presents for Medicare Annual (Subsequent) preventive examination.  Review of Systems:  N/A  Cardiac Risk Factors include: advanced age (>27men, >58 women);dyslipidemia;hypertension     Objective:     Vitals: BP 140/82 (BP Location: Left Arm)   Pulse 84   Temp 98 F (36.7 C) (Oral)   Ht 5\' 1"  (1.549 m)   Wt 139 lb 6.4 oz (63.2 kg)   BMI 26.34 kg/m   Body mass index is 26.34 kg/m.  Advanced Directives 07/08/2017 06/24/2016 05/14/2016 03/08/2015 01/14/2015 12/26/2014  Does Patient Have a Medical Advance Directive? Yes Yes No Yes Yes Yes  Type of Paramedic of Startup;Living will Living will;Healthcare Power of Attorney - Living will;Healthcare Power of [place];Living will [place];Living will  Copy of [place] in Chart? No - copy requested No - copy requested - - - -  Would patient like information on creating a medical advance directive? - - No - patient declined information - - -    Tobacco Social History   Tobacco Use  Smoking Status Never Smoker  Smokeless Tobacco Never Used     Counseling given: Not Answered   Clinical Intake:  Pre-visit preparation completed: Yes  Pain : No/denies pain Pain Score: 0-No pain     Nutritional Status: BMI 25 -29 Overweight Nutritional Risks: None Diabetes: No  How often do you need to have someone help you when you read instructions, pamphlets, or other written materials from your doctor or pharmacy?: 1 - Never  Interpreter Needed?: No  Information entered by :: [facility], LPN  Past Medical History:  Diagnosis Date  . Cancer [facility]) 2011   lymphoma  . Hyperlipidemia   . Hypertension    Past Surgical History:  Procedure Laterality Date  . cataract surgery Bilateral 2005  . CHOLECYSTECTOMY  1991   Family History  Problem Relation Age of Onset  . Hypertension Mother   .  Stroke Mother   . CAD Mother   . Hypertension Father   . Heart attack Father   . Hypertension Brother   . Seizures Brother   . Hypertension Son   . Breast cancer Neg Hx    Social History   Socioeconomic History  . Marital status: Widowed    Spouse name: Barbara Chambers  . Number of children: 3  . Years of education: None  . Highest education level: 12th grade  Social Needs  . Financial resource strain: Not hard at all  . Food insecurity - worry: Never true  . Food insecurity - inability: Never true  . Transportation needs - medical: No  . Transportation needs - non-medical: No  Occupational History  . Occupation: retired  Tobacco Use  . Smoking status: Never Smoker  . Smokeless tobacco: Never Used  Substance and Sexual Activity  . Alcohol use: No  . Drug use: No  . Sexual activity: None  Other Topics Concern  . None  Social History Narrative   Pt has 1 son that was killed in a car accident at age 56.     Outpatient Encounter Medications as of 07/08/2017  Medication Sig  . aspirin 81 MG tablet Take 81 mg by mouth daily.   [name] COENZYME Q-10 PO Take by mouth daily.   [name] losartan (COZAAR) 50 MG tablet Take 1 tablet (50 mg total) daily by mouth.  . losartan-hydrochlorothiazide (HYZAAR) 100-12.5 MG tablet Take  0.5 tablets daily by mouth.  . simvastatin (ZOCOR) 5 MG tablet TAKE ONE TABLET BY MOUTH AT BEDTIME   No facility-administered encounter medications on file as of 07/08/2017.     Activities of Daily Living In your present state of health, do you have any difficulty performing the following activities: 07/08/2017  Hearing? Y  Comment Pt has some hearing loss. Not interested in seeing an audiologist for this.  Vision? N  Difficulty concentrating or making decisions? N  Walking or climbing stairs? N  Dressing or bathing? N  Doing errands, shopping? N  Preparing Food and eating ? N  Using the Toilet? N  In the past six months, have you accidently leaked urine? Y  Comment  occasionally, will wear protection when going out  Do you have problems with loss of bowel control? N  Managing your Medications? N  Managing your Finances? N  Housekeeping or managing your Housekeeping? N  Some recent data might be hidden    Patient Care Team: [name], MD as PCP - General (Family Medicine) Lloyd Huger, MD as Consulting Physician (Oncology) Corey Skains, MD as Consulting Physician (Cardiology) Brendolyn Patty, MD as Consulting Physician (Dermatology)    Assessment:   This is a routine wellness examination for Barbara Chambers.  Exercise Activities and Dietary recommendations Current Exercise Habits: The patient does not participate in regular exercise at present, Exercise limited by: Other - see comments(pt has been sick)  Goals    . Exercise     Pt plans to start back walking 5 days a week for 1 hour (3 miles).        Fall Risk Fall Risk  07/08/2017 06/24/2016 12/26/2014  Falls in the past year? No Yes No  Number falls in past yr: - 1 -  Comment - tripped -  Injury with Fall? - No -  Comment - bruising -  Follow up - Falls prevention discussed -   Is the patient's home free of loose throw rugs in walkways, pet beds, electrical cords, etc?   yes      Grab bars in the bathroom? yes      Handrails on the stairs?   yes      Adequate lighting?   yes  Timed Get Up and Go performed: N/A  Depression Screen PHQ 2/9 Scores 07/08/2017 07/08/2017 06/24/2016 12/26/2014  PHQ - 2 Score 0 0 1 0  PHQ- 9 Score 0 - - -     Cognitive Function: Pt declined screening today.     6CIT Screen 06/24/2016  What Year? 0 points  What month? 0 points  What time? 0 points  Count back from 20 0 points  Months in reverse 0 points  Repeat phrase 2 points  Total Score 2    Immunization History  Administered Date(s) Administered  . Influenza, High Dose Seasonal PF 06/24/2016, 04/20/2017  . Pneumococcal Conjugate-13 03/30/2014  . Pneumococcal Polysaccharide-23  04/20/2017  . Td 04/05/2017  . Tdap 07/14/2005    Qualifies for Shingles Vaccine? Due for Shingles vaccine. Declined my offer to administer today. Education has been provided regarding the importance of this vaccine. Pt has been advised to call her insurance company to determine her out of pocket expense. Advised she may also receive this vaccine at her local pharmacy or Health Dept. Verbalized acceptance and understanding.  Screening Tests Health Maintenance  Topic Date Due  . TETANUS/TDAP  04/06/2027  . INFLUENZA VACCINE  Completed  . DEXA SCAN  Completed  .  PNA vac Low Risk Adult  Completed    Cancer Screenings: Lung: Low Dose CT Chest recommended if Age 27-80 years, 30 pack-year currently smoking OR have quit w/in 15years. Patient does not qualify. Breast:  Up to date on Mammogram? Yes   Up to date of Bone Density/Dexa? Yes Colorectal: Up to date  Additional Screenings:  Hepatitis B/HIV/Syphillis: Pt declines today.  Hepatitis C Screening: Pt declines today.     Plan:  I have personally reviewed and addressed the Medicare Annual Wellness questionnaire and have noted the following in the patient's chart:  A. Medical and social history B. Use of alcohol, tobacco or illicit drugs  C. Current medications and supplements D. Functional ability and status E.  Nutritional status F.  Physical activity G. Advance directives H. List of other physicians I.  Hospitalizations, surgeries, and ER visits in previous 12 months J.  [place] such as hearing and vision if needed, cognitive and depression L. Referrals and appointments - none  In addition, I have reviewed and discussed with patient certain preventive protocols, quality metrics, and best practice recommendations. A written personalized care plan for preventive services as well as general preventive health recommendations were provided to patient.  See attached scanned questionnaire for additional information.    Signed,  Fabio Neighbors, LPN Nurse Health Advisor   Nurse Recommendations: None.

## 2017-07-08 NOTE — Patient Instructions (Signed)
Barbara Chambers , Thank you for taking time to come for your Medicare Wellness Visit. I appreciate your ongoing commitment to your health goals. Please review the following plan we discussed and let me know if I can assist you in the future.   Screening recommendations/referrals: Colonoscopy: Up to date Mammogram: Up to date Bone Density: Up to date Recommended yearly ophthalmology/optometry visit for glaucoma screening and checkup Recommended yearly dental visit for hygiene and checkup  Vaccinations: Influenza vaccine: Up to date Pneumococcal vaccine: Up to date Tdap vaccine: Up to date Shingles vaccine: Pt declines today.     Advanced directives: Please bring a copy of your POA (Power of Attorney) and/or Living Will to your next appointment.   Conditions/risks identified: Pt plans to start back walking 5 days a week for 1 hour (3 miles).   Next appointment: 10:00 AM today   Preventive Care 65 Years and Older, Female Preventive care refers to lifestyle choices and visits with your health care provider that can promote health and wellness. What does preventive care include?  A yearly physical exam. This is also called an annual well check.  Dental exams once or twice a year.  Routine eye exams. Ask your health care provider how often you should have your eyes checked.  Personal lifestyle choices, including:  Daily care of your teeth and gums.  Regular physical activity.  Eating a healthy diet.  Avoiding tobacco and drug use.  Limiting alcohol use.  Practicing safe sex.  Taking low-dose aspirin every day.  Taking vitamin and mineral supplements as recommended by your health care provider. What happens during an annual well check? The services and screenings done by your health care provider during your annual well check will depend on your age, overall health, lifestyle risk factors, and family history of disease. Counseling  Your health care provider may ask you  questions about your:  Alcohol use.  Tobacco use.  Drug use.  Emotional well-being.  Home and relationship well-being.  Sexual activity.  Eating habits.  History of falls.  Memory and ability to understand (cognition).  Work and work Statistician.  Reproductive health. Screening  You may have the following tests or measurements:  Height, weight, and BMI.  Blood pressure.  Lipid and cholesterol levels. These may be checked every 5 years, or more frequently if you are over 65 years old.  Skin check.  Lung cancer screening. You may have this screening every year starting at age 30 if you have a 30-pack-year history of smoking and currently smoke or have quit within the past 15 years.  Fecal occult blood test (FOBT) of the stool. You may have this test every year starting at age 63.  Flexible sigmoidoscopy or colonoscopy. You may have a sigmoidoscopy every 5 years or a colonoscopy every 10 years starting at age 56.  Hepatitis C blood test.  Hepatitis B blood test.  Sexually transmitted disease (STD) testing.  Diabetes screening. This is done by checking your blood sugar (glucose) after you have not eaten for a while (fasting). You may have this done every 1-3 years.  Bone density scan. This is done to screen for osteoporosis. You may have this done starting at age 59.  Mammogram. This may be done every 1-2 years. Talk to your health care provider about how often you should have regular mammograms. Talk with your health care provider about your test results, treatment options, and if necessary, the need for more tests. Vaccines  Your health care provider  may recommend certain vaccines, such as:  Influenza vaccine. This is recommended every year.  Tetanus, diphtheria, and acellular pertussis (Tdap, Td) vaccine. You may need a Td booster every 10 years.  Zoster vaccine. You may need this after age 15.  Pneumococcal 13-valent conjugate (PCV13) vaccine. One dose is  recommended after age 5.  Pneumococcal polysaccharide (PPSV23) vaccine. One dose is recommended after age 29. Talk to your health care provider about which screenings and vaccines you need and how often you need them. This information is not intended to replace advice given to you by your health care provider. Make sure you discuss any questions you have with your health care provider. Document Released: 07/12/2015 Document Revised: 03/04/2016 Document Reviewed: 04/16/2015 Elsevier Interactive Patient Education  2017 [place] Prevention in the Home Falls can cause injuries. They can happen to people of all ages. There are many things you can do to make your home safe and to help prevent falls. What can I do on the outside of my home?  Regularly fix the edges of walkways and driveways and fix any cracks.  Remove anything that might make you trip as you walk through a door, such as a raised step or threshold.  Trim any bushes or trees on the path to your home.  Use bright outdoor lighting.  Clear any walking paths of anything that might make someone trip, such as rocks or tools.  Regularly check to see if handrails are loose or broken. Make sure that both sides of any steps have handrails.  Any raised decks and porches should have guardrails on the edges.  Have any leaves, snow, or ice cleared regularly.  Use sand or salt on walking paths during winter.  Clean up any spills in your garage right away. This includes oil or grease spills. What can I do in the bathroom?  Use night lights.  Install grab bars by the toilet and in the tub and shower. Do not use towel bars as grab bars.  Use non-skid mats or decals in the tub or shower.  If you need to sit down in the shower, use a plastic, non-slip stool.  Keep the floor dry. Clean up any water that spills on the floor as soon as it happens.  Remove soap buildup in the tub or shower regularly.  Attach bath mats  securely with double-sided non-slip rug tape.  Do not have throw rugs and other things on the floor that can make you trip. What can I do in the bedroom?  Use night lights.  Make sure that you have a light by your bed that is easy to reach.  Do not use any sheets or blankets that are too big for your bed. They should not hang down onto the floor.  Have a firm chair that has side arms. You can use this for support while you get dressed.  Do not have throw rugs and other things on the floor that can make you trip. What can I do in the kitchen?  Clean up any spills right away.  Avoid walking on wet floors.  Keep items that you use a lot in easy-to-reach places.  If you need to reach something above you, use a strong step stool that has a grab bar.  Keep electrical cords out of the way.  Do not use floor polish or wax that makes floors slippery. If you must use wax, use non-skid floor wax.  Do not have throw rugs  and other things on the floor that can make you trip. What can I do with my stairs?  Do not leave any items on the stairs.  Make sure that there are handrails on both sides of the stairs and use them. Fix handrails that are broken or loose. Make sure that handrails are as long as the stairways.  Check any carpeting to make sure that it is firmly attached to the stairs. Fix any carpet that is loose or worn.  Avoid having throw rugs at the top or bottom of the stairs. If you do have throw rugs, attach them to the floor with carpet tape.  Make sure that you have a light switch at the top of the stairs and the bottom of the stairs. If you do not have them, ask someone to add them for you. What else can I do to help prevent falls?  Wear shoes that:  Do not have high heels.  Have rubber bottoms.  Are comfortable and fit you well.  Are closed at the toe. Do not wear sandals.  If you use a stepladder:  Make sure that it is fully opened. Do not climb a closed  stepladder.  Make sure that both sides of the stepladder are locked into place.  Ask someone to hold it for you, if possible.  Clearly mark and make sure that you can see:  Any grab bars or handrails.  First and last steps.  Where the edge of each step is.  Use tools that help you move around (mobility aids) if they are needed. These include:  Canes.  Walkers.  Scooters.  Crutches.  Turn on the lights when you go into a dark area. Replace any light bulbs as soon as they burn out.  Set up your furniture so you have a clear path. Avoid moving your furniture around.  If any of your floors are uneven, fix them.  If there are any pets around you, be aware of where they are.  Review your medicines with your doctor. Some medicines can make you feel dizzy. This can increase your chance of falling. Ask your doctor what other things that you can do to help prevent falls. This information is not intended to replace advice given to you by your health care provider. Make sure you discuss any questions you have with your health care provider. Document Released: 04/11/2009 Document Revised: 11/21/2015 Document Reviewed: 07/20/2014 Elsevier Interactive Patient Education  2017 [employer].

## 2017-07-08 NOTE — Patient Instructions (Addendum)
Try Mucinex for sinus congestion Flonase prescription sent to pharmacy to nasal passage swelling Call next Monday if symptoms aren't better and we will try an antibiotic for sinus infection   Viral Respiratory Infection A viral respiratory infection is an illness that affects parts of the body used for breathing, like the lungs, nose, and throat. It is caused by a germ called a virus. Some examples of this kind of infection are:  A cold.  The flu (influenza).  A respiratory syncytial virus (RSV) infection.  How do I know if I have this infection? Most of the time this infection causes:  A stuffy or runny nose.  Yellow or green fluid in the nose.  A cough.  Sneezing.  Tiredness (fatigue).  Achy muscles.  A sore throat.  Sweating or chills.  A fever.  A headache.  How is this infection treated? If the flu is diagnosed early, it may be treated with an antiviral medicine. This medicine shortens the length of time a person has symptoms. Symptoms may be treated with over-the-counter and prescription medicines, such as:  Expectorants. These make it easier to cough up mucus.  Decongestant nasal sprays.  Doctors do not prescribe antibiotic medicines for viral infections. They do not work with this kind of infection. How do I know if I should stay home? To keep others from getting sick, stay home if you have:  A fever.  A lasting cough.  A sore throat.  A runny nose.  Sneezing.  Muscles aches.  Headaches.  Tiredness.  Weakness.  Chills.  Sweating.  An upset stomach (nausea).  Follow these instructions at home:  Rest as much as possible.  Take over-the-counter and prescription medicines only as told by your doctor.  Drink enough fluid to keep your pee (urine) clear or pale yellow.  Gargle with salt water. Do this 3-4 times per day or as needed. To make a salt-water mixture, dissolve -1 tsp of salt in 1 cup of warm water. Make sure the salt  dissolves all the way.  Use nose drops made from salt water. This helps with stuffiness (congestion). It also helps soften the skin around your nose.  Do not drink alcohol.  Do not use tobacco products, including cigarettes, chewing tobacco, and e-cigarettes. If you need help quitting, ask your doctor. Get help if:  Your symptoms last for 10 days or longer.  Your symptoms get worse over time.  You have a fever.  You have very bad pain in your face or forehead.  Parts of your jaw or neck become very swollen. Get help right away if:  You feel pain or pressure in your chest.  You have shortness of breath.  You faint or feel like you will faint.  You keep throwing up (vomiting).  You feel confused. This information is not intended to replace advice given to you by your health care provider. Make sure you discuss any questions you have with your health care provider. Document Released: 05/28/2008 Document Revised: 11/21/2015 Document Reviewed: 11/21/2014 Elsevier Interactive Patient Education  2018 [place] 65 Years and Older, Female Preventive care refers to lifestyle choices and visits with your health care provider that can promote health and wellness. What does preventive care include?  A yearly physical exam. This is also called an annual well check.  Dental exams once or twice a year.  Routine eye exams. Ask your health care provider how often you should have your eyes checked.  Personal lifestyle  choices, including: ? Daily care of your teeth and gums. ? Regular physical activity. ? Eating a healthy diet. ? Avoiding tobacco and drug use. ? Limiting alcohol use. ? Practicing safe sex. ? Taking low-dose aspirin every day. ? Taking vitamin and mineral supplements as recommended by your health care provider. What happens during an annual well check? The services and screenings done by your health care provider during your annual well check  will depend on your age, overall health, lifestyle risk factors, and family history of disease. Counseling Your health care provider may ask you questions about your:  Alcohol use.  Tobacco use.  Drug use.  Emotional well-being.  Home and relationship well-being.  Sexual activity.  Eating habits.  History of falls.  Memory and ability to understand (cognition).  Work and work Statistician.  Reproductive health.  Screening You may have the following tests or measurements:  Height, weight, and BMI.  Blood pressure.  Lipid and cholesterol levels. These may be checked every 5 years, or more frequently if you are over 23 years old.  Skin check.  Lung cancer screening. You may have this screening every year starting at age 44 if you have a 30-pack-year history of smoking and currently smoke or have quit within the past 15 years.  Fecal occult blood test (FOBT) of the stool. You may have this test every year starting at age 30.  Flexible sigmoidoscopy or colonoscopy. You may have a sigmoidoscopy every 5 years or a colonoscopy every 10 years starting at age 49.  Hepatitis C blood test.  Hepatitis B blood test.  Sexually transmitted disease (STD) testing.  Diabetes screening. This is done by checking your blood sugar (glucose) after you have not eaten for a while (fasting). You may have this done every 1-3 years.  Bone density scan. This is done to screen for osteoporosis. You may have this done starting at age 83.  Mammogram. This may be done every 1-2 years. Talk to your health care provider about how often you should have regular mammograms.  Talk with your health care provider about your test results, treatment options, and if necessary, the need for more tests. Vaccines Your health care provider may recommend certain vaccines, such as:  Influenza vaccine. This is recommended every year.  Tetanus, diphtheria, and acellular pertussis (Tdap, Td) vaccine. You may  need a Td booster every 10 years.  Varicella vaccine. You may need this if you have not been vaccinated.  Zoster vaccine. You may need this after age 49.  Measles, mumps, and rubella (MMR) vaccine. You may need at least one dose of MMR if you were born in 1957 or later. You may also need a second dose.  Pneumococcal 13-valent conjugate (PCV13) vaccine. One dose is recommended after age 50.  Pneumococcal polysaccharide (PPSV23) vaccine. One dose is recommended after age 68.  Meningococcal vaccine. You may need this if you have certain conditions.  Hepatitis A vaccine. You may need this if you have certain conditions or if you travel or work in places where you may be exposed to hepatitis A.  Hepatitis B vaccine. You may need this if you have certain conditions or if you travel or work in places where you may be exposed to hepatitis B.  Haemophilus influenzae type b (Hib) vaccine. You may need this if you have certain conditions.  Talk to your health care provider about which screenings and vaccines you need and how often you need them. This information is not intended  advice given to you by your health care provider. Make sure you discuss any questions you have with your health care provider. Document Released: 07/12/2015 Document Revised: 03/04/2016 Document Reviewed: 04/16/2015 Elsevier Interactive Patient Education  2018 [employer] Inc[employer]  

## 2017-07-09 LAB — LIPID PANEL
Chol/HDL Ratio: 3.3 ratio (ref 0.0–4.4)
Cholesterol, Total: 176 mg/dL (ref 100–199)
HDL: 54 mg/dL (ref >39)
LDL Calculated: 100 mg/dL — ABNORMAL HIGH (ref 0–99)
Triglycerides: 110 mg/dL (ref 0–149)
VLDL CHOLESTEROL CAL: 22 mg/dL (ref 5–40)

## 2017-07-09 LAB — T4, FREE: Free T4: 1.18 ng/dL (ref 0.82–1.77)

## 2017-07-09 LAB — TSH: TSH: 3.85 u[IU]/mL (ref 0.450–4.500)

## 2017-07-13 ENCOUNTER — Other Ambulatory Visit: Payer: Self-pay | Admitting: Family Medicine

## 2017-07-13 DIAGNOSIS — E785 Hyperlipidemia, unspecified: Secondary | ICD-10-CM

## 2017-07-30 DIAGNOSIS — H35032 Hypertensive retinopathy, left eye: Secondary | ICD-10-CM | POA: Diagnosis not present

## 2017-10-07 DIAGNOSIS — H02834 Dermatochalasis of left upper eyelid: Secondary | ICD-10-CM | POA: Diagnosis not present

## 2017-10-07 DIAGNOSIS — H02831 Dermatochalasis of right upper eyelid: Secondary | ICD-10-CM | POA: Diagnosis not present

## 2017-10-28 DIAGNOSIS — H35032 Hypertensive retinopathy, left eye: Secondary | ICD-10-CM | POA: Diagnosis not present

## 2017-11-01 DIAGNOSIS — E782 Mixed hyperlipidemia: Secondary | ICD-10-CM | POA: Diagnosis not present

## 2017-11-01 DIAGNOSIS — R0602 Shortness of breath: Secondary | ICD-10-CM | POA: Diagnosis not present

## 2017-11-01 DIAGNOSIS — I10 Essential (primary) hypertension: Secondary | ICD-10-CM | POA: Diagnosis not present

## 2017-11-01 DIAGNOSIS — R9431 Abnormal electrocardiogram [ECG] [EKG]: Secondary | ICD-10-CM | POA: Diagnosis not present

## 2017-11-02 DIAGNOSIS — L812 Freckles: Secondary | ICD-10-CM | POA: Diagnosis not present

## 2017-11-02 DIAGNOSIS — Z1283 Encounter for screening for malignant neoplasm of skin: Secondary | ICD-10-CM | POA: Diagnosis not present

## 2017-11-02 DIAGNOSIS — L820 Inflamed seborrheic keratosis: Secondary | ICD-10-CM | POA: Diagnosis not present

## 2017-11-02 DIAGNOSIS — L570 Actinic keratosis: Secondary | ICD-10-CM | POA: Diagnosis not present

## 2017-11-02 DIAGNOSIS — L578 Other skin changes due to chronic exposure to nonionizing radiation: Secondary | ICD-10-CM | POA: Diagnosis not present

## 2017-11-02 DIAGNOSIS — I8393 Asymptomatic varicose veins of bilateral lower extremities: Secondary | ICD-10-CM | POA: Diagnosis not present

## 2017-11-02 DIAGNOSIS — L821 Other seborrheic keratosis: Secondary | ICD-10-CM | POA: Diagnosis not present

## 2017-11-02 DIAGNOSIS — Z85828 Personal history of other malignant neoplasm of skin: Secondary | ICD-10-CM | POA: Diagnosis not present

## 2017-11-08 NOTE — Progress Notes (Signed)
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  INTERVAL HISTORY: Patient returns to clinic today for repeat laboratory work and routine yearly evaluation.  She continues to feel well and remains asymptomatic. She denies any fevers, night sweats, or weight loss. She has no neurologic complaints. She denies any chest pain or shortness of breath. She denies any nausea, vomiting, constipation, or diarrhea. She has no urinary complaints.  Patient feels at her baseline offers no specific complaints today.  REVIEW OF SYSTEMS:   Review of Systems  Constitutional: Negative.  Negative for diaphoresis, fever, malaise/fatigue and weight loss.  Respiratory: Negative.  Negative for cough and shortness of breath.   Cardiovascular: Negative.  Negative for chest pain and leg swelling.  Gastrointestinal: Negative.  Negative for abdominal pain.  Genitourinary: Negative.  Negative for dysuria.  Musculoskeletal: Negative.   Skin: Negative.  Negative for rash.  Neurological: Negative.  Negative for sensory change, focal weakness and weakness.  Psychiatric/Behavioral: Negative.  Negative for depression. The patient is not nervous/anxious.     As per HPI. Otherwise, a complete review of systems is negative.  PAST MEDICAL HISTORY: Past Medical History:  Diagnosis Date  . Cancer [facility]) 2011   lymphoma  . Hyperlipidemia   . Hypertension     PAST SURGICAL HISTORY: Past Surgical History:  Procedure Laterality Date  . cataract surgery Bilateral 2005  . CHOLECYSTECTOMY  1991      FAMILY HISTORY Family History  Problem Relation Age of Onset  . Hypertension Mother   . Stroke Mother   . CAD Mother   . Hypertension Father   . Heart attack Father   . Hypertension Brother   . Seizures Brother   . Hypertension Son   . Breast cancer Neg Hx     GYNECOLOGIC HISTORY:  No LMP recorded. Patient is postmenopausal.     ADVANCED DIRECTIVES:    HEALTH MAINTENANCE: Social History   Tobacco Use  . Smoking status: Never Smoker  . Smokeless tobacco: Never Used  Substance Use Topics  . Alcohol use: No  . Drug use: No     Colonoscopy:  PAP:  Bone density:  Lipid panel:  Allergies  Allergen Reactions  . Penicillins Rash    Current Outpatient Medications  Medication Sig Dispense Refill  . COENZYME Q-10 PO Take by mouth daily.     [name] losartan-hydrochlorothiazide (HYZAAR) 100-12.5 MG tablet Take 0.5 tablets daily by mouth. 45 tablet 3  . simvastatin (ZOCOR) 5 MG tablet TAKE 1 TABLET BY MOUTH AT BEDTIME 90 tablet 1  . aspirin 81 MG tablet Take 81 mg by mouth daily.     . fluticasone (FLONASE) 50 MCG/ACT nasal spray Place 2 sprays into both nostrils daily. (Patient not taking: Reported on 11/10/2017) 16 g 6   No current facility-administered medications for this visit.     OBJECTIVE: BP (!) 149/71 (BP Location: Left Arm, Patient Position: Sitting)   Pulse 86   Temp (!) 97.3 F (36.3 C) (Tympanic)   Resp 18   Ht '5\' 1"'  (1.549 m)   Wt 138 lb 3.2 oz (62.7 kg)   SpO2  97%   BMI 26.11 kg/m    Body mass index is 26.11 kg/m.    ECOG FS:0 - Asymptomatic  General: Well-developed, well-nourished, no acute distress. Eyes: Pink conjunctiva, anicteric sclera. HEENT: Normocephalic, moist mucous membranes, clear oropharnyx. Lungs: Clear to auscultation bilaterally. Heart: Regular rate and rhythm. No rubs, murmurs, or gallops. Abdomen: Soft, nontender, nondistended. No organomegaly noted, normoactive bowel sounds. Musculoskeletal: No edema, cyanosis, or  clubbing. Neuro: Alert, answering all questions appropriately. Cranial nerves grossly intact. Skin: No rashes or petechiae noted. Psych: Normal affect. Lymphatics: No cervical, calvicular, axillary or inguinal LAD.  LAB RESULTS:  Appointment on 11/10/2017  Component Date Value Ref Range Status  . WBC 11/10/2017 24.5* 3.6 - 11.0 K/uL Final  . RBC 11/10/2017 4.87  3.80 - 5.20 MIL/uL Final  . Hemoglobin 11/10/2017 13.4  12.0 - 16.0 g/dL Final  . HCT 11/10/2017 40.4  35.0 - 47.0 % Final  . MCV 11/10/2017 83.0  80.0 - 100.0 fL Final  . MCH 11/10/2017 27.5  26.0 - 34.0 pg Final  . MCHC 11/10/2017 33.2  32.0 - 36.0 g/dL Final  . RDW 11/10/2017 14.2  11.5 - 14.5 % Final  . Platelets 11/10/2017 277  150 - 440 K/uL Final  . Neutrophils Relative % 11/10/2017 24  % Final  . Neutro Abs 11/10/2017 5.8  1.4 - 6.5 K/uL Final  . Lymphocytes Relative 11/10/2017 71  % Final  . Lymphs Abs 11/10/2017 17.7* 1.0 - 3.6 K/uL Final  . Monocytes Relative 11/10/2017 4  % Final  . Monocytes Absolute 11/10/2017 0.8  0.2 - 0.9 K/uL Final  . Eosinophils Relative 11/10/2017 1  % Final  . Eosinophils Absolute 11/10/2017 0.1  0 - 0.7 K/uL Final  . Basophils Relative 11/10/2017 0  % Final  . Basophils Absolute 11/10/2017 0.1  0 - 0.1 K/uL Final   Performed at [facility], [address] [place], [place] [address]    STUDIES: No results found.  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  PLAN:    1. CLL variant: Patient's white blood cell count remains mildly elevated at 24.5, but essentially stable and unchanged since at least July 2016.  Patient has no evidence of endorgan damage such as thrombocytopenia or anemia.  No intervention is needed at this time.  Patient does not require bone marrow biopsy. Can consider imaging in the future, but this is not necessary at this point either.  Patient continues to request less frequent follow-up, therefore she will return  to clinic in 1 year with repeat laboratory work and further evaluation.  Plan to repeat peripheral blood flow cytometry at that clinic visit.  Approximately 20 minutes was spent in discussion of which greater than 50% was consultation.  Patient expressed understanding and was in agreement with this plan. She also understands that She can call clinic at any time with any questions, concerns, or complaints.    Lloyd Huger, MD   11/12/2017 1:58 PM

## 2017-11-10 ENCOUNTER — Inpatient Hospital Stay (HOSPITAL_BASED_OUTPATIENT_CLINIC_OR_DEPARTMENT_OTHER): Payer: PPO | Admitting: Oncology

## 2017-11-10 ENCOUNTER — Inpatient Hospital Stay: Payer: PPO | Attending: Oncology

## 2017-11-10 ENCOUNTER — Encounter: Payer: Self-pay | Admitting: Oncology

## 2017-11-10 VITALS — BP 149/71 | HR 86 | Temp 97.3°F | Resp 18 | Ht 61.0 in | Wt 138.2 lb

## 2017-11-10 DIAGNOSIS — C9110 Chronic lymphocytic leukemia of B-cell type not having achieved remission: Secondary | ICD-10-CM

## 2017-11-10 DIAGNOSIS — I10 Essential (primary) hypertension: Secondary | ICD-10-CM | POA: Insufficient documentation

## 2017-11-10 LAB — CBC WITH DIFFERENTIAL/PLATELET
BASOS ABS: 0.1 10*3/uL (ref 0–0.1)
BASOS PCT: 0 %
Eosinophils Absolute: 0.1 10*3/uL (ref 0–0.7)
Eosinophils Relative: 1 %
HCT: 40.4 % (ref 35.0–47.0)
HEMOGLOBIN: 13.4 g/dL (ref 12.0–16.0)
LYMPHS PCT: 71 %
Lymphs Abs: 17.7 10*3/uL — ABNORMAL HIGH (ref 1.0–3.6)
MCH: 27.5 pg (ref 26.0–34.0)
MCHC: 33.2 g/dL (ref 32.0–36.0)
MCV: 83 fL (ref 80.0–100.0)
MONO ABS: 0.8 10*3/uL (ref 0.2–0.9)
Monocytes Relative: 4 %
NEUTROS ABS: 5.8 10*3/uL (ref 1.4–6.5)
NEUTROS PCT: 24 %
Platelets: 277 10*3/uL (ref 150–440)
RBC: 4.87 MIL/uL (ref 3.80–5.20)
RDW: 14.2 % (ref 11.5–14.5)
WBC: 24.5 10*3/uL — ABNORMAL HIGH (ref 3.6–11.0)

## 2017-11-10 NOTE — Progress Notes (Signed)
Patient c/o of pain noted to bilateral hips with standing or movement

## 2018-01-05 ENCOUNTER — Ambulatory Visit (INDEPENDENT_AMBULATORY_CARE_PROVIDER_SITE_OTHER): Payer: PPO | Admitting: Family Medicine

## 2018-01-05 ENCOUNTER — Encounter: Payer: Self-pay | Admitting: Family Medicine

## 2018-01-05 VITALS — BP 140/70 | HR 74 | Temp 97.9°F | Resp 16 | Wt 137.0 lb

## 2018-01-05 DIAGNOSIS — E785 Hyperlipidemia, unspecified: Secondary | ICD-10-CM | POA: Diagnosis not present

## 2018-01-05 DIAGNOSIS — I10 Essential (primary) hypertension: Secondary | ICD-10-CM | POA: Diagnosis not present

## 2018-01-05 DIAGNOSIS — N3091 Cystitis, unspecified with hematuria: Secondary | ICD-10-CM

## 2018-01-05 DIAGNOSIS — E039 Hypothyroidism, unspecified: Secondary | ICD-10-CM | POA: Diagnosis not present

## 2018-01-05 DIAGNOSIS — E038 Other specified hypothyroidism: Secondary | ICD-10-CM

## 2018-01-05 LAB — POCT URINALYSIS DIPSTICK
Appearance: NORMAL
Bilirubin, UA: NEGATIVE
Glucose, UA: NEGATIVE
Ketones, UA: NEGATIVE
Nitrite, UA: NEGATIVE
Odor: NORMAL
PH UA: 6.5 (ref 5.0–8.0)
Protein, UA: NEGATIVE
SPEC GRAV UA: 1.015 (ref 1.010–1.025)
UROBILINOGEN UA: 0.2 U/dL

## 2018-01-05 MED ORDER — LOSARTAN POTASSIUM-HCTZ 100-12.5 MG PO TABS: 0.5000 | ORAL_TABLET | Freq: Every day | ORAL | 3 refills | Status: DC

## 2018-01-05 MED ORDER — SIMVASTATIN 5 MG PO TABS: 5.0000 mg | ORAL_TABLET | Freq: Every day | ORAL | 3 refills | Status: DC

## 2018-01-05 MED ORDER — SULFAMETHOXAZOLE-TRIMETHOPRIM 800-160 MG PO TABS: 1.0000 | ORAL_TABLET | Freq: Two times a day (BID) | ORAL | 0 refills | Status: AC

## 2018-01-05 NOTE — Progress Notes (Signed)
Patient: Barbara Chambers Female    DOB: 01-25-1935   83 y.o.   MRN: [id number] Visit Date: 01/05/2018  Today's Provider: Lavon Paganini, MD   I, Martha Clan, CMA, am acting as scribe for Lavon Paganini, MD.  Chief Complaint  Patient presents with  . Hypertension  . Hypothyroidism   Subjective:    HPI      Hypertension, follow-up:  BP Readings from Last 3 Encounters:  01/05/18 140/70  11/10/17 (!) 149/71  07/08/17 132/76    She was last seen for hypertension 6 months ago.  BP at that visit was 132/76. Management since that visit includes none; continuing medications. She reports good compliance with treatment. She is not having side effects.  She is exercising. 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 She is adherent to low salt diet.   Outside blood pressures are 120/60. Pt states BP is different in each arm. She is experiencing tachycardia. She states her heart rate is elevated (up to 114) after exertion such as gardening. States she feels weak when HR is elevated.  Patient denies chest pain, chest pressure/discomfort, claudication, dyspnea, irregular heart beat, lower extremity edema, near-syncope, orthopnea, palpitations and syncope.   Cardiovascular risk factors include advanced age (older than 63 for men, 67 for women) and hypertension.  Use of agents associated with hypertension: none.     Weight trend: stable Wt Readings from Last 3 Encounters:  01/05/18 137 lb (62.1 kg)  11/10/17 138 lb 3.2 oz (62.7 kg)  07/08/17 139 lb (63 kg)    Current diet: in general, a "healthy" diet    ------------------------------------------------------------------------  Subclinical Hypothyroid, follow-up:  TSH  Date Value Ref Range Status  07/08/2017 3.850 0.450 - 4.500 uIU/mL Final  08/27/2016 4.210 0.450 - 4.500 uIU/mL Final  07/07/2016 4.550 (H) 0.450 - 4.500 uIU/mL Final   Wt Readings from Last 3 Encounters:  01/05/18 137 lb (62.1 kg)  11/10/17 138 lb 3.2  oz (62.7 kg)  07/08/17 139 lb (63 kg)    She was last seen for hypothyroid 6 months ago.  Management since that visit includes none. This is currently not being treated. She is experiencing change in energy level She denies diarrhea, heat / cold intolerance, nervousness, palpitations and weight changes  ------------------------------------------------------------------------ Pt is also c/o dysuria and vaginal itching. She believes she could have a UTI.  Also with urinary urgency.  No frequency, abd pain, fever, hematuria that she has noticed.  Allergies  Allergen Reactions  . Penicillins Rash     Current Outpatient Medications:  .  COENZYME Q-10 PO, Take by mouth daily. , Disp: , Rfl:  .  losartan-hydrochlorothiazide (HYZAAR) 100-12.5 MG tablet, Take 0.5 tablets by mouth daily., Disp: 45 tablet, Rfl: 3 .  simvastatin (ZOCOR) 5 MG tablet, Take 1 tablet (5 mg total) by mouth at bedtime., Disp: 90 tablet, Rfl: 3 .  sulfamethoxazole-trimethoprim (BACTRIM DS,SEPTRA DS) 800-160 MG tablet, Take 1 tablet by mouth 2 (two) times daily for 5 days., Disp: 10 tablet, Rfl: 0  Review of Systems  Constitutional: Positive for activity change and fatigue. Negative for appetite change, chills, diaphoresis, fever and unexpected weight change.  Respiratory: Negative for shortness of breath.   Cardiovascular: Negative for chest pain, palpitations and leg swelling.  Endocrine: Negative for cold intolerance and heat intolerance.  Genitourinary: Positive for dysuria.    Social History   Tobacco Use  . Smoking status: Never Smoker  . Smokeless tobacco: Never Used  Substance  Use Topics  . Alcohol use: No   Objective:   BP 140/70 (BP Location: Left Arm, Patient Position: Sitting, Cuff Size: Normal)   Pulse 74   Temp 97.9 F (36.6 C) (Oral)   Resp 16   Wt 137 lb (62.1 kg)   SpO2 98%   BMI 25.89 kg/m  Vitals:   01/05/18 0803  BP: 140/70  Pulse: 74  Resp: 16  Temp: 97.9 F (36.6 C)    TempSrc: Oral  SpO2: 98%  Weight: 137 lb (62.1 kg)     Physical Exam  Constitutional: She is oriented to person, place, and time. She appears well-developed and well-nourished. No distress.  HENT:  Head: Normocephalic and atraumatic.  Eyes: Conjunctivae are normal. No scleral icterus.  Neck: Neck supple. No thyromegaly present.  Cardiovascular: Normal rate, regular rhythm, normal heart sounds and intact distal pulses.  No murmur heard. Pulmonary/Chest: Effort normal and breath sounds normal. No respiratory distress. She has no wheezes. She has no rales.  Abdominal: Soft. Bowel sounds are normal. She exhibits no distension. There is no tenderness. There is no rebound and no guarding.  Musculoskeletal: She exhibits no edema or deformity.  Neurological: She is alert and oriented to person, place, and time.  Skin: Skin is warm and dry. Capillary refill takes less than 2 seconds. No rash noted.  Psychiatric: She has a normal mood and affect. Her behavior is normal.  Vitals reviewed.   Results for orders placed or performed in visit on 01/05/18  POCT urinalysis dipstick  Result Value Ref Range   Color, UA yellow    Clarity, UA clear    Glucose, UA Negative Negative   Bilirubin, UA negative    Ketones, UA negative    Spec Grav, UA 1.015 1.010 - 1.025   Blood, UA NH Trace    pH, UA 6.5 5.0 - 8.0   Protein, UA Negative Negative   Urobilinogen, UA 0.2 0.2 or 1.0 E.U./dL   Nitrite, UA negative    Leukocytes, UA Trace (A) Negative   Appearance normal    Odor normal        Assessment & Plan:   Problem List Items Addressed This Visit      Cardiovascular and Mediastinum   Benign essential HTN - Primary    Well-controlled, with home blood pressure is even better than today Continue current meds which were refilled Recheck BMP Follow-up in 6 months at annual wellness visit/physical      Relevant Medications   simvastatin (ZOCOR) 5 MG tablet   losartan-hydrochlorothiazide  (HYZAAR) 100-12.5 MG tablet   Other Relevant Orders   Basic Metabolic Panel (BMET)     Endocrine   Subclinical hypothyroidism    Asymptomatic Not on any medications Well-controlled at last check Recheck TSH and free T4 today      Relevant Orders   TSH   T4, free     Other   HLD (hyperlipidemia)    Refilled simvastatin      Relevant Medications   simvastatin (ZOCOR) 5 MG tablet   losartan-hydrochlorothiazide (HYZAAR) 100-12.5 MG tablet    Other Visit Diagnoses    Cystitis with hematuria       Relevant Orders   Urine Culture   Urinalysis, microscopic only   POCT urinalysis dipstick (Completed)    - UA and symptoms c/w likely UTI with possible hematuria - will send UCx and micro to confirm and check sensitivities - start treatment with 5d course of Bactrim - return precautions discussed  Return in about 6 months (around 07/08/2018) for CPE and AWV after 07/08/18.   The entirety of the information documented in the History of Present Illness, Review of Systems and Physical Exam were personally obtained by me. Portions of this information were initially documented by Raquel Sarna Ratchford, CMA and reviewed by me for thoroughness and accuracy.    [name], MD, MPH [facility] 01/05/2018 8:59 AM

## 2018-01-05 NOTE — Assessment & Plan Note (Signed)
Well-controlled, with home blood pressure is even better than today Continue current meds which were refilled Recheck BMP Follow-up in 6 months at annual wellness visit/physical

## 2018-01-05 NOTE — Assessment & Plan Note (Signed)
Asymptomatic Not on any medications Well-controlled at last check Recheck TSH and free T4 today

## 2018-01-05 NOTE — Assessment & Plan Note (Signed)
Refilled simvastatin.

## 2018-01-05 NOTE — Patient Instructions (Addendum)

## 2018-01-06 ENCOUNTER — Telehealth: Payer: Self-pay

## 2018-01-06 DIAGNOSIS — H02831 Dermatochalasis of right upper eyelid: Secondary | ICD-10-CM | POA: Diagnosis not present

## 2018-01-06 DIAGNOSIS — H02834 Dermatochalasis of left upper eyelid: Secondary | ICD-10-CM | POA: Diagnosis not present

## 2018-01-06 LAB — URINALYSIS, MICROSCOPIC ONLY
CASTS: NONE SEEN /LPF
RBC, UA: NONE SEEN /hpf (ref 0–2)

## 2018-01-06 LAB — BASIC METABOLIC PANEL
BUN / CREAT RATIO: 15 (ref 12–28)
BUN: 12 mg/dL (ref 8–27)
CHLORIDE: 100 mmol/L (ref 96–106)
CO2: 25 mmol/L (ref 20–29)
Calcium: 9.2 mg/dL (ref 8.7–10.3)
Creatinine, Ser: 0.78 mg/dL (ref 0.57–1.00)
GFR calc Af Amer: 83 mL/min/{1.73_m2} (ref >59)
GFR, EST NON AFRICAN AMERICAN: 72 mL/min/{1.73_m2} (ref >59)
Glucose: 100 mg/dL — ABNORMAL HIGH (ref 65–99)
POTASSIUM: 4.3 mmol/L (ref 3.5–5.2)
SODIUM: 139 mmol/L (ref 134–144)

## 2018-01-06 LAB — T4, FREE: Free T4: 1.09 ng/dL (ref 0.82–1.77)

## 2018-01-06 LAB — TSH: TSH: 4.9 u[IU]/mL — ABNORMAL HIGH (ref 0.450–4.500)

## 2018-01-06 NOTE — Telephone Encounter (Signed)
Pt advised and acknowledges understanding.

## 2018-01-06 NOTE — Telephone Encounter (Signed)
-----   Message from [name], MD sent at 01/06/2018  9:34 AM EDT ----- Still awaiting urine culture results.  No blood in urine on microscopy.  Normal kidney function and electrolytes.  Hormone that stimulates thyroid remains slightly elevated with normal thyroid function.  No need for medications at this time.  We will continue to monitor at least annually.  [name], MD, MPH [facility] 01/06/2018 9:34 AM

## 2018-01-07 ENCOUNTER — Telehealth: Payer: Self-pay

## 2018-01-07 LAB — URINE CULTURE

## 2018-01-07 NOTE — Telephone Encounter (Signed)
Pt advised.

## 2018-01-07 NOTE — Telephone Encounter (Signed)
-----   Message from [name], MD sent at 01/07/2018  8:10 AM EDT ----- No significant growth on urine culture.  Ok to stop antibiotics.  [name], MD, MPH [facility] 01/07/2018 8:10 AM

## 2018-04-11 ENCOUNTER — Other Ambulatory Visit: Payer: Self-pay | Admitting: Family Medicine

## 2018-04-11 ENCOUNTER — Other Ambulatory Visit: Payer: Self-pay | Admitting: Physician Assistant

## 2018-04-11 DIAGNOSIS — Z1231 Encounter for screening mammogram for malignant neoplasm of breast: Secondary | ICD-10-CM

## 2018-04-29 ENCOUNTER — Ambulatory Visit
Admission: RE | Admit: 2018-04-29 | Discharge: 2018-04-29 | Disposition: A | Discharge status: Home / Self Care | Payer: PPO | Source: Ambulatory Visit | Attending: Family Medicine | Admitting: Family Medicine

## 2018-04-29 DIAGNOSIS — Z1231 Encounter for screening mammogram for malignant neoplasm of breast: Secondary | ICD-10-CM | POA: Insufficient documentation

## 2018-05-04 ENCOUNTER — Ambulatory Visit (INDEPENDENT_AMBULATORY_CARE_PROVIDER_SITE_OTHER): Payer: PPO

## 2018-05-04 DIAGNOSIS — Z23 Encounter for immunization: Secondary | ICD-10-CM

## 2018-05-09 DIAGNOSIS — L570 Actinic keratosis: Secondary | ICD-10-CM | POA: Diagnosis not present

## 2018-05-09 DIAGNOSIS — L578 Other skin changes due to chronic exposure to nonionizing radiation: Secondary | ICD-10-CM | POA: Diagnosis not present

## 2018-05-20 ENCOUNTER — Telehealth: Payer: Self-pay | Admitting: Family Medicine

## 2018-05-20 MED ORDER — LOSARTAN POTASSIUM 100 MG PO TABS: 100.0000 mg | ORAL_TABLET | Freq: Every day | ORAL | 3 refills | Status: DC

## 2018-05-20 MED ORDER — HYDROCHLOROTHIAZIDE 12.5 MG PO CAPS: 12.5000 mg | ORAL_CAPSULE | Freq: Every day | ORAL | 3 refills | Status: DC

## 2018-05-20 NOTE — Telephone Encounter (Signed)
We received a fax from [facility] stating that losartan-hydrochlorothiazide (HYZAAR) 100-12.5 MG tablet is on backorder and they are requesting that you resend for two separate medications.  Please advise

## 2018-05-20 NOTE — Telephone Encounter (Signed)
RX sent to [facility] pharmacy

## 2018-05-20 NOTE — Telephone Encounter (Signed)
OK to send refills for each medicine separately, 90 day supply with 3 refills  , [name], MD, MPH [facility] 05/20/2018 11:48 AM

## 2018-07-05 DIAGNOSIS — L578 Other skin changes due to chronic exposure to nonionizing radiation: Secondary | ICD-10-CM | POA: Diagnosis not present

## 2018-07-05 DIAGNOSIS — L570 Actinic keratosis: Secondary | ICD-10-CM | POA: Diagnosis not present

## 2018-07-11 ENCOUNTER — Encounter: Payer: Self-pay | Admitting: Family Medicine

## 2018-07-11 ENCOUNTER — Ambulatory Visit (INDEPENDENT_AMBULATORY_CARE_PROVIDER_SITE_OTHER): Payer: PPO | Admitting: Family Medicine

## 2018-07-11 ENCOUNTER — Ambulatory Visit (INDEPENDENT_AMBULATORY_CARE_PROVIDER_SITE_OTHER): Payer: PPO

## 2018-07-11 VITALS — BP 140/74 | HR 84 | Temp 98.1°F | Ht 61.0 in | Wt 134.6 lb

## 2018-07-11 DIAGNOSIS — Z0000 Encounter for general adult medical examination without abnormal findings: Secondary | ICD-10-CM

## 2018-07-11 DIAGNOSIS — E039 Hypothyroidism, unspecified: Secondary | ICD-10-CM | POA: Diagnosis not present

## 2018-07-11 DIAGNOSIS — N9089 Other specified noninflammatory disorders of vulva and perineum: Secondary | ICD-10-CM | POA: Diagnosis not present

## 2018-07-11 DIAGNOSIS — R0602 Shortness of breath: Secondary | ICD-10-CM | POA: Diagnosis not present

## 2018-07-11 DIAGNOSIS — E785 Hyperlipidemia, unspecified: Secondary | ICD-10-CM

## 2018-07-11 DIAGNOSIS — I10 Essential (primary) hypertension: Secondary | ICD-10-CM

## 2018-07-11 DIAGNOSIS — C9110 Chronic lymphocytic leukemia of B-cell type not having achieved remission: Secondary | ICD-10-CM | POA: Diagnosis not present

## 2018-07-11 DIAGNOSIS — E038 Other specified hypothyroidism: Secondary | ICD-10-CM

## 2018-07-11 MED ORDER — NYSTATIN 100000 UNIT/GM EX CREA: 1.0000 "application " | TOPICAL_CREAM | Freq: Two times a day (BID) | CUTANEOUS | 0 refills | Status: DC

## 2018-07-11 NOTE — Assessment & Plan Note (Signed)
Continue zocor Recheck CMP and lipid panel Followed by Cardiology

## 2018-07-11 NOTE — Assessment & Plan Note (Signed)
Well controlled Continue current meds Recheck CMP F/u in 6 months

## 2018-07-11 NOTE — Assessment & Plan Note (Signed)
Asymptomatic Not on medications Well controlled at last check Recheck TSH and free T4

## 2018-07-11 NOTE — Assessment & Plan Note (Signed)
Seems to be long-standing ongoing issue that has been evaluated by Cardiology Likely multifactorial Discussed red flag findings and reasons to f/u with Cardiology sooner

## 2018-07-11 NOTE — Patient Instructions (Signed)
Barbara Chambers , Thank you for taking time to come for your Medicare Wellness Visit. I appreciate your ongoing commitment to your health goals. Please review the following plan we discussed and let me know if I can assist you in the future.   Screening recommendations/referrals: Colonoscopy: No longer required.  Mammogram: No longer required.  Bone Density: Up to date Recommended yearly ophthalmology/optometry visit for glaucoma screening and checkup Recommended yearly dental visit for hygiene and checkup  Vaccinations: Influenza vaccine: Up to date Pneumococcal vaccine: Completed series Tdap vaccine: Up to date, due 03/2027 Shingles vaccine: Pt declines today.     Advanced directives: Please bring a copy of your POA (Power of Attorney) and/or Living Will to your next appointment.   Conditions/risks identified: Fall risk prevention; Continue trying to increase water intake to 6-8 8 oz glasses a day.   Next appointment: 10:00 AM today with Dr [name].    Preventive Care 82 Years and Older, Female Preventive care refers to lifestyle choices and visits with your health care provider that can promote health and wellness. What does preventive care include?  A yearly physical exam. This is also called an annual well check.  Dental exams once or twice a year.  Routine eye exams. Ask your health care provider how often you should have your eyes checked.  Personal lifestyle choices, including:  Daily care of your teeth and gums.  Regular physical activity.  Eating a healthy diet.  Avoiding tobacco and drug use.  Limiting alcohol use.  Practicing safe sex.  Taking low-dose aspirin every day.  Taking vitamin and mineral supplements as recommended by your health care provider. What happens during an annual well check? The services and screenings done by your health care provider during your annual well check will depend on your age, overall health, lifestyle risk factors, and  family history of disease. Counseling  Your health care provider may ask you questions about your:  Alcohol use.  Tobacco use.  Drug use.  Emotional well-being.  Home and relationship well-being.  Sexual activity.  Eating habits.  History of falls.  Memory and ability to understand (cognition).  Work and work Statistician.  Reproductive health. Screening  You may have the following tests or measurements:  Height, weight, and BMI.  Blood pressure.  Lipid and cholesterol levels. These may be checked every 5 years, or more frequently if you are over 82 years old.  Skin check.  Lung cancer screening. You may have this screening every year starting at age 82 if you have a 30-pack-year history of smoking and currently smoke or have quit within the past 15 years.  Fecal occult blood test (FOBT) of the stool. You may have this test every year starting at age 82.  Flexible sigmoidoscopy or colonoscopy. You may have a sigmoidoscopy every 5 years or a colonoscopy every 10 years starting at age 82.  Hepatitis C blood test.  Hepatitis B blood test.  Sexually transmitted disease (STD) testing.  Diabetes screening. This is done by checking your blood sugar (glucose) after you have not eaten for a while (fasting). You may have this done every 1-3 years.  Bone density scan. This is done to screen for osteoporosis. You may have this done starting at age 82.  Mammogram. This may be done every 1-2 years. Talk to your health care provider about how often you should have regular mammograms. Talk with your health care provider about your test results, treatment options, and if necessary, the need for  more tests. Vaccines  Your health care provider may recommend certain vaccines, such as:  Influenza vaccine. This is recommended every year.  Tetanus, diphtheria, and acellular pertussis (Tdap, Td) vaccine. You may need a Td booster every 10 years.  Zoster vaccine. You may need this  after age 14.  Pneumococcal 13-valent conjugate (PCV13) vaccine. One dose is recommended after age 82.  Pneumococcal polysaccharide (PPSV23) vaccine. One dose is recommended after age 82. Talk to your health care provider about which screenings and vaccines you need and how often you need them. This information is not intended to replace advice given to you by your health care provider. Make sure you discuss any questions you have with your health care provider. Document Released: 07/12/2015 Document Revised: 03/04/2016 Document Reviewed: 04/16/2015 Elsevier Interactive Patient Education  2017 [place] Prevention in the Home Falls can cause injuries. They can happen to people of all ages. There are many things you can do to make your home safe and to help prevent falls. What can I do on the outside of my home?  Regularly fix the edges of walkways and driveways and fix any cracks.  Remove anything that might make you trip as you walk through a door, such as a raised step or threshold.  Trim any bushes or trees on the path to your home.  Use bright outdoor lighting.  Clear any walking paths of anything that might make someone trip, such as rocks or tools.  Regularly check to see if handrails are loose or broken. Make sure that both sides of any steps have handrails.  Any raised decks and porches should have guardrails on the edges.  Have any leaves, snow, or ice cleared regularly.  Use sand or salt on walking paths during winter.  Clean up any spills in your garage right away. This includes oil or grease spills. What can I do in the bathroom?  Use night lights.  Install grab bars by the toilet and in the tub and shower. Do not use towel bars as grab bars.  Use non-skid mats or decals in the tub or shower.  If you need to sit down in the shower, use a plastic, non-slip stool.  Keep the floor dry. Clean up any water that spills on the floor as soon as it  happens.  Remove soap buildup in the tub or shower regularly.  Attach bath mats securely with double-sided non-slip rug tape.  Do not have throw rugs and other things on the floor that can make you trip. What can I do in the bedroom?  Use night lights.  Make sure that you have a light by your bed that is easy to reach.  Do not use any sheets or blankets that are too big for your bed. They should not hang down onto the floor.  Have a firm chair that has side arms. You can use this for support while you get dressed.  Do not have throw rugs and other things on the floor that can make you trip. What can I do in the kitchen?  Clean up any spills right away.  Avoid walking on wet floors.  Keep items that you use a lot in easy-to-reach places.  If you need to reach something above you, use a strong step stool that has a grab bar.  Keep electrical cords out of the way.  Do not use floor polish or wax that makes floors slippery. If you must use wax, use non-skid  floor wax.  Do not have throw rugs and other things on the floor that can make you trip. What can I do with my stairs?  Do not leave any items on the stairs.  Make sure that there are handrails on both sides of the stairs and use them. Fix handrails that are broken or loose. Make sure that handrails are as long as the stairways.  Check any carpeting to make sure that it is firmly attached to the stairs. Fix any carpet that is loose or worn.  Avoid having throw rugs at the top or bottom of the stairs. If you do have throw rugs, attach them to the floor with carpet tape.  Make sure that you have a light switch at the top of the stairs and the bottom of the stairs. If you do not have them, ask someone to add them for you. What else can I do to help prevent falls?  Wear shoes that:  Do not have high heels.  Have rubber bottoms.  Are comfortable and fit you well.  Are closed at the toe. Do not wear sandals.  If you  use a stepladder:  Make sure that it is fully opened. Do not climb a closed stepladder.  Make sure that both sides of the stepladder are locked into place.  Ask someone to hold it for you, if possible.  Clearly mark and make sure that you can see:  Any grab bars or handrails.  First and last steps.  Where the edge of each step is.  Use tools that help you move around (mobility aids) if they are needed. These include:  Canes.  Walkers.  Scooters.  Crutches.  Turn on the lights when you go into a dark area. Replace any light bulbs as soon as they burn out.  Set up your furniture so you have a clear path. Avoid moving your furniture around.  If any of your floors are uneven, fix them.  If there are any pets around you, be aware of where they are.  Review your medicines with your doctor. Some medicines can make you feel dizzy. This can increase your chance of falling. Ask your doctor what other things that you can do to help prevent falls. This information is not intended to replace advice given to you by your health care provider. Make sure you discuss any questions you have with your health care provider. Document Released: 04/11/2009 Document Revised: 11/21/2015 Document Reviewed: 07/20/2014 Elsevier Interactive Patient Education  2017 [employer].

## 2018-07-11 NOTE — Progress Notes (Signed)
Patient: Barbara Chambers, Female    DOB: Nov 03, 1934, 84 y.o.   MRN: [id number] Visit Date: 07/11/2018  Today's Provider: Lavon Paganini, MD   Chief Complaint  Patient presents with  . Annual Exam   Subjective:  I, Barbara Chambers, CMA, am acting as a scribe for Barbara Paganini, MD.   Patient had a AWE prior to appointment  Complete Physical Barbara Chambers is a 84 y.o. female. She feels well. She reports exercising none at this time, but plans to restart soon. She reports she is sleeping fairly well.  Was seen in 12/2017 for possible UTI.  Urine culture with no growth, so antibiotic was stopped.  No dysuria, nocturia, hematuria, urinary frequency, fevers.  Occasional malodorous urin.  She is concerned that she is having itching and burning of vulva and perineum ongoing since that time.  Chest tightness and SOB: Ongoing for years.  Can occur with exertion or at rest. Lasts 1-2 minutes and then Self-resolves. Worried because sister-in-law had MI.  She has discussed with Barbara Chambers who she sees annually and was reassured. -----------------------------------------------------------   Review of Systems  Constitutional: Negative.   HENT: Negative.   Eyes: Negative.   Respiratory: Negative.   Cardiovascular: Negative.   Gastrointestinal: Negative.   Endocrine: Negative.   Genitourinary: Negative.   Musculoskeletal: Negative.   Skin: Negative.   Allergic/Immunologic: Negative.   Neurological: Negative.   Hematological: Negative.   Psychiatric/Behavioral: Negative.     Social History   Socioeconomic History  . Marital status: Widowed    Spouse name: Barbara Chambers  . Number of children: 3  . Years of education: Not on file  . Highest education level: 12th grade  Occupational History  . Occupation: retired  Scientific laboratory technician  . Financial resource strain: Not hard at all  . Food insecurity:    Worry: Never true    Inability: Never true  . Transportation needs:    Medical: No   Non-medical: No  Tobacco Use  . Smoking status: Never Smoker  . Smokeless tobacco: Never Used  Substance and Sexual Activity  . Alcohol use: No  . Drug use: No  . Sexual activity: Not on file  Lifestyle  . Physical activity:    Days per week: 0 days    Minutes per session: 0 min  . Stress: Only a little  Relationships  . Social connections:    Talks on phone: Patient refused    Gets together: Patient refused    Attends religious service: Patient refused    Active member of club or organization: Patient refused    Attends meetings of clubs or organizations: Patient refused    Relationship status: Patient refused  . Intimate partner violence:    Fear of current or ex partner: Patient refused    Emotionally abused: Patient refused    Physically abused: Patient refused    Forced sexual activity: Patient refused  Other Topics Concern  . Not on file  Social History Narrative   Pt has 1 son that was killed in a car accident at age 56.     Past Medical History:  Diagnosis Date  . Cancer [facility] [place]) 2011   lymphoma  . Hyperlipidemia   . Hypertension      Patient Active Problem List   Diagnosis Date Noted  . Shortness of breath 07/11/2018  . Vulvar irritation 07/11/2018  . Dizziness 05/18/2017  . Leg cramps 04/20/2017  . Arthritis 03/08/2015  . Anxiety 10/25/2014  . Chronic  lymphatic leukemia ([place]) 10/25/2014  . HLD (hyperlipidemia) 10/25/2014  . Subclinical hypothyroidism 10/25/2014  . Abnormal ECG 10/11/2014  . Benign essential HTN 10/11/2014    Past Surgical History:  Procedure Laterality Date  . cataract surgery Bilateral 2005  . CHOLECYSTECTOMY  1991  . RECONSTRUCTION OF EYELID      Her family history includes CAD in her mother; Heart attack in her father; Hypertension in her brother, father, mother, and son; Seizures in her brother; Stroke in her mother. There is no history of Breast cancer.      Current Outpatient Medications:  .  aspirin 81 MG tablet, Take 81  mg by mouth daily., Disp: , Rfl:  .  COENZYME Q-10 PO, Take by mouth daily. , Disp: , Rfl:  .  hydrochlorothiazide (MICROZIDE) 12.5 MG capsule, Take 1 capsule (12.5 mg total) by mouth daily., Disp: 90 capsule, Rfl: 3 .  losartan (COZAAR) 100 MG tablet, Take 1 tablet (100 mg total) by mouth daily., Disp: 90 tablet, Rfl: 3 .  nystatin cream (MYCOSTATIN), Apply 1 application topically 2 (two) times daily., Disp: 60 g, Rfl: 0 .  simvastatin (ZOCOR) 5 MG tablet, Take 1 tablet (5 mg total) by mouth at bedtime., Disp: 90 tablet, Rfl: 3  Patient Care Team: [name], [name], MD as PCP - General (Family Medicine) Lloyd Huger, MD as Consulting Physician (Oncology) Corey Skains, MD as Consulting Physician (Cardiology) Brendolyn Patty, MD as Consulting Physician (Dermatology) Birder Robson, MD as Referring Physician (Ophthalmology)     Objective:   Vitals: BP 140/74 (BP Location: Right Arm, Patient Position: Sitting, Cuff Size: Normal)   Pulse 84   Temp 98.1 F (36.7 C) (Oral)   Ht 5\' 1"  (1.549 m)   Wt 134 lb 9.6 oz (61.1 kg)   BMI 25.43 kg/m   Physical Exam Vitals signs reviewed.  Constitutional:      General: She is not in acute distress.    Appearance: Normal appearance. She is not ill-appearing or diaphoretic.  HENT:     Head: Normocephalic and atraumatic.     Right Ear: Tympanic membrane, ear canal and external ear normal.     Left Ear: Tympanic membrane, ear canal and external ear normal.     Nose: Nose normal. No congestion.     Mouth/Throat:     Mouth: Mucous membranes are moist.     Pharynx: Oropharynx is clear. No oropharyngeal exudate or posterior oropharyngeal erythema.  Eyes:     General:        Right eye: No discharge.        Left eye: No discharge.     Extraocular Movements: Extraocular movements intact.     Conjunctiva/sclera: Conjunctivae normal.     Pupils: Pupils are equal, round, and reactive to light.  Neck:     Musculoskeletal: Neck supple.  No muscular tenderness.  Cardiovascular:     Rate and Rhythm: Normal rate and regular rhythm.     Pulses: Normal pulses.     Heart sounds: Normal heart sounds. No murmur.  Pulmonary:     Effort: Pulmonary effort is normal. No respiratory distress.     Breath sounds: Normal breath sounds. No wheezing, rhonchi or rales.  Abdominal:     General: There is no distension.     Palpations: Abdomen is soft.     Tenderness: There is no abdominal tenderness.  Genitourinary:    Comments: Minimal vulvar irritation Musculoskeletal:     Right lower leg: Edema (trace ankle  edema bilaterally) present.     Left lower leg: Edema present.  Lymphadenopathy:     Cervical: Cervical adenopathy present.  Skin:    General: Skin is warm and dry.     Capillary Refill: Capillary refill takes less than 2 seconds.     Findings: No rash.  Neurological:     General: No focal deficit present.     Mental Status: She is alert and oriented to person, place, and time.     Cranial Nerves: No cranial nerve deficit.  Psychiatric:        Mood and Affect: Mood normal.        Behavior: Behavior normal.     Activities of Daily Living In your present state of health, do you have any difficulty performing the following activities: 07/11/2018  Hearing? N  Vision? N  Comment Wears eye glasses.  Difficulty concentrating or making decisions? N  Walking or climbing stairs? N  Dressing or bathing? N  Doing errands, shopping? N  Preparing Food and eating ? N  Using the Toilet? N  In the past six months, have you accidently leaked urine? Y  Comment Rarely, wears protection when going out.   Do you have problems with loss of bowel control? N  Managing your Medications? N  Managing your Finances? N  Housekeeping or managing your Housekeeping? N  Some recent data might be hidden    Fall Risk Assessment Fall Risk  07/11/2018 07/08/2017 06/24/2016 12/26/2014  Falls in the past year? 1 No Yes No  Number falls in past yr: 0 -  1 -  Comment - - tripped -  Injury with Fall? 0 - No -  Comment - - bruising -  Follow up Falls prevention discussed - Falls prevention discussed -     Depression Screen PHQ 2/9 Scores 07/11/2018 07/08/2017 07/08/2017 06/24/2016  PHQ - 2 Score 0 0 0 1  PHQ- 9 Score - 0 - -    6CIT Screen 06/24/2016  What Year? 0 points  What month? 0 points  What time? 0 points  Count back from 20 0 points  Months in reverse 0 points  Repeat phrase 2 points  Total Score 2      Assessment & Plan:    Annual Physical Reviewed patient's Family Medical History Reviewed and updated list of patient's medical providers Assessment of cognitive impairment was done Assessed patient's functional ability Established a written schedule for health screening [place] Completed and Reviewed  Exercise Activities and Dietary recommendations Goals    . Exercise     Starting 06/24/16, I will start back walking 4 days a week for 45 minutes.    . Exercise     Pt plans to start back walking 5 days a week for 1 hour (3 miles).     [name] LIFESTYLE - DECREASE FALLS RISK     Recommend to drink at least 6-8 8oz glasses of water per day.        Immunization History  Administered Date(s) Administered  . Influenza, High Dose Seasonal PF 06/24/2016, 04/20/2017, 05/04/2018  . Pneumococcal Conjugate-13 03/30/2014  . Pneumococcal Polysaccharide-23 04/20/2017  . Td 04/05/2017  . Tdap 07/14/2005    Health Maintenance  Topic Date Due  . TETANUS/TDAP  04/06/2027  . INFLUENZA VACCINE  Completed  . DEXA SCAN  Completed  . PNA vac Low Risk Adult  Completed     Discussed health benefits of physical activity, and encouraged her to engage in  regular exercise appropriate for her age and condition.    ------------------------------------------------------------------------------------------------------------  Problem List Items Addressed This Visit      Cardiovascular and Mediastinum    Benign essential HTN    Well controlled Continue current meds Recheck CMP F/u in 6 months      Relevant Orders   Comprehensive metabolic panel     Endocrine   Subclinical hypothyroidism    Asymptomatic Not on medications Well controlled at last check Recheck TSH and free T4      Relevant Orders   TSH + free T4     Other   Chronic lymphatic leukemia ([place])    Has been followed by Oncology for years Will recheck CBC Noted new cervical adenopathy that was not noted on last Oncology exam Will send message to Oncologist alerting him to this new finding as she does not have f/u appt until 10/2018      Relevant Orders   CBC w/Diff/Platelet   HLD (hyperlipidemia)    Continue zocor Recheck CMP and lipid panel Followed by Cardiology      Relevant Orders   Comprehensive metabolic panel   Lipid panel   Shortness of breath    Seems to be long-standing ongoing issue that has been evaluated by Cardiology Likely multifactorial Discussed red flag findings and reasons to f/u with Cardiology sooner      Vulvar irritation    New problem Appears to be fungal in nature Discussed keeping area clean and dry Will treat with nystatin BID until resolution Atrophic vaginitis may contribute as well - could consider topical therapies in the future       Other Visit Diagnoses    Encounter for annual physical exam    -  Primary       Return in about 6 months (around 01/09/2019) for chronic disease f/u.   The entirety of the information documented in the History of Present Illness, Review of Systems and Physical Exam were personally obtained by me. Portions of this information were initially documented by Barbara Chambers, CMA and reviewed by me for thoroughness and accuracy.    [name], MD, MPH [facility] 07/11/2018 10:38 AM

## 2018-07-11 NOTE — Assessment & Plan Note (Signed)
Has been followed by Oncology for years Will recheck CBC Noted new cervical adenopathy that was not noted on last Oncology exam Will send message to Oncologist alerting him to this new finding as she does not have f/u appt until 10/2018

## 2018-07-11 NOTE — Patient Instructions (Signed)
Preventive Care 82 Years and Older, Female Preventive care refers to lifestyle choices and visits with your health care provider that can promote health and wellness. What does preventive care include?  A yearly physical exam. This is also called an annual well check.  Dental exams once or twice a year.  Routine eye exams. Ask your health care provider how often you should have your eyes checked.  Personal lifestyle choices, including: ? Daily care of your teeth and gums. ? Regular physical activity. ? Eating a healthy diet. ? Avoiding tobacco and drug use. ? Limiting alcohol use. ? Practicing safe sex. ? Taking low-dose aspirin every day. ? Taking vitamin and mineral supplements as recommended by your health care provider. What happens during an annual well check? The services and screenings done by your health care provider during your annual well check will depend on your age, overall health, lifestyle risk factors, and family history of disease. Counseling Your health care provider may ask you questions about your:  Alcohol use.  Tobacco use.  Drug use.  Emotional well-being.  Home and relationship well-being.  Sexual activity.  Eating habits.  History of falls.  Memory and ability to understand (cognition).  Work and work Statistician.  Reproductive health.  Screening You may have the following tests or measurements:  Height, weight, and BMI.  Blood pressure.  Lipid and cholesterol levels. These may be checked every 5 years, or more frequently if you are over 30 years old.  Skin check.  Lung cancer screening. You may have this screening every year starting at age 27 if you have a 30-pack-year history of smoking and currently smoke or have quit within the past 15 years.  Colorectal cancer screening. All adults should have this screening starting at age 33 and continuing until age 46. You will have tests every 1-10 years, depending on your results and the  type of screening test. People at increased risk should start screening at an earlier age. Screening tests may include: ? Guaiac-based fecal occult blood testing. ? Fecal immunochemical test (FIT). ? Stool DNA test. ? Virtual colonoscopy. ? Sigmoidoscopy. During this test, a flexible tube with a tiny camera (sigmoidoscope) is used to examine your rectum and lower colon. The sigmoidoscope is inserted through your anus into your rectum and lower colon. ? Colonoscopy. During this test, a long, thin, flexible tube with a tiny camera (colonoscope) is used to examine your entire colon and rectum.  Hepatitis C blood test.  Hepatitis B blood test.  Sexually transmitted disease (STD) testing.  Diabetes screening. This is done by checking your blood sugar (glucose) after you have not eaten for a while (fasting). You may have this done every 1-3 years.  Bone density scan. This is done to screen for osteoporosis. You may have this done starting at age 37.  Mammogram. This may be done every 1-2 years. Talk to your health care provider about how often you should have regular mammograms. Talk with your health care provider about your test results, treatment options, and if necessary, the need for more tests. Vaccines Your health care provider may recommend certain vaccines, such as:  Influenza vaccine. This is recommended every year.  Tetanus, diphtheria, and acellular pertussis (Tdap, Td) vaccine. You may need a Td booster every 10 years.  Varicella vaccine. You may need this if you have not been vaccinated.  Zoster vaccine. You may need this after age 38.  Measles, mumps, and rubella (MMR) vaccine. You may need at least  one dose of MMR if you were born in 1957 or later. You may also need a second dose.  Pneumococcal 13-valent conjugate (PCV13) vaccine. One dose is recommended after age 24.  Pneumococcal polysaccharide (PPSV23) vaccine. One dose is recommended after age 24.  Meningococcal  vaccine. You may need this if you have certain conditions.  Hepatitis A vaccine. You may need this if you have certain conditions or if you travel or work in places where you may be exposed to hepatitis A.  Hepatitis B vaccine. You may need this if you have certain conditions or if you travel or work in places where you may be exposed to hepatitis B.  Haemophilus influenzae type b (Hib) vaccine. You may need this if you have certain conditions. Talk to your health care provider about which screenings and vaccines you need and how often you need them. This information is not intended to replace advice given to you by your health care provider. Make sure you discuss any questions you have with your health care provider. Document Released: 07/12/2015 Document Revised: 08/05/2017 Document Reviewed: 04/16/2015 Elsevier Interactive Patient Education  2019 [employer].

## 2018-07-11 NOTE — Assessment & Plan Note (Signed)
New problem Appears to be fungal in nature Discussed keeping area clean and dry Will treat with nystatin BID until resolution Atrophic vaginitis may contribute as well - could consider topical therapies in the future

## 2018-07-11 NOTE — Progress Notes (Signed)
Subjective:   Barbara Chambers is a 84 y.o. female who presents for Medicare Annual (Subsequent) preventive examination.  Review of Systems:  N/A  Cardiac Risk Factors include: advanced age (>10men, >38 women);dyslipidemia;hypertension     Objective:     Vitals: BP 140/74 (BP Location: Right Arm)   Pulse 84   Temp 98.1 F (36.7 C) (Oral)   Ht 5\' 1"  (1.549 m)   Wt 134 lb 9.6 oz (61.1 kg)   BMI 25.43 kg/m   Body mass index is 25.43 kg/m.  Advanced Directives 07/11/2018 11/10/2017 07/08/2017 06/24/2016 05/14/2016 03/08/2015 01/14/2015  Does Patient Have a Medical Advance Directive? Yes No Yes Yes No Yes Yes  Type of Paramedic of [place];Living will - [place];Living will Living will;Healthcare Power of Attorney - Living will;Healthcare Power of [place];Living will  Copy of [place] in Chart? No - copy requested - No - copy requested No - copy requested - - -  Would patient like information on creating a medical advance directive? - No - Patient declined - - No - patient declined information - -    Tobacco Social History   Tobacco Use  Smoking Status Never Smoker  Smokeless Tobacco Never Used     Counseling given: Not Answered   Clinical Intake:     Pain : No/denies pain Pain Score: 0-No pain     Nutritional Status: BMI 25 -29 Overweight Nutritional Risks: None Diabetes: No  How often do you need to have someone help you when you read instructions, pamphlets, or other written materials from your doctor or pharmacy?: 1 - Never  Interpreter Needed?: No  Information entered by :: [facility], LPN  Past Medical History:  Diagnosis Date  . Cancer [facility]) 2011   lymphoma  . Hyperlipidemia   . Hypertension    Past Surgical History:  Procedure Laterality Date  . cataract surgery Bilateral 2005  . CHOLECYSTECTOMY  1991  . RECONSTRUCTION OF EYELID     Family History    Problem Relation Age of Onset  . Hypertension Mother   . Stroke Mother   . CAD Mother   . Hypertension Father   . Heart attack Father   . Hypertension Brother   . Seizures Brother   . Hypertension Son   . Breast cancer Neg Hx    Social History   Socioeconomic History  . Marital status: Widowed    Spouse name: Casandra Doffing  . Number of children: 3  . Years of education: Not on file  . Highest education level: 12th grade  Occupational History  . Occupation: retired  Scientific laboratory technician  . Financial resource strain: Not hard at all  . Food insecurity:    Worry: Never true    Inability: Never true  . Transportation needs:    Medical: No    Non-medical: No  Tobacco Use  . Smoking status: Never Smoker  . Smokeless tobacco: Never Used  Substance and Sexual Activity  . Alcohol use: No  . Drug use: No  . Sexual activity: Not on file  Lifestyle  . Physical activity:    Days per week: 0 days    Minutes per session: 0 min  . Stress: Only a little  Relationships  . Social connections:    Talks on phone: Patient refused    Gets together: Patient refused    Attends religious service: Patient refused    Active member of club or organization:  Patient refused    Attends meetings of clubs or organizations: Patient refused    Relationship status: Patient refused  Other Topics Concern  . Not on file  Social History Narrative   Pt has 1 son that was killed in a car accident at age 36.     Outpatient Encounter Medications as of 07/11/2018  Medication Sig  . aspirin 81 MG tablet Take 81 mg by mouth daily.  [name] COENZYME Q-10 PO Take by mouth daily.   . hydrochlorothiazide (MICROZIDE) 12.5 MG capsule Take 1 capsule (12.5 mg total) by mouth daily.  [name] losartan (COZAAR) 100 MG tablet Take 1 tablet (100 mg total) by mouth daily.  . simvastatin (ZOCOR) 5 MG tablet Take 1 tablet (5 mg total) by mouth at bedtime.   No facility-administered encounter medications on file as of 07/11/2018.      Activities of Daily Living In your present state of health, do you have any difficulty performing the following activities: 07/11/2018  Hearing? N  Vision? N  Comment Wears eye glasses.  Difficulty concentrating or making decisions? N  Walking or climbing stairs? N  Dressing or bathing? N  Doing errands, shopping? N  Preparing Food and eating ? N  Using the Toilet? N  In the past six months, have you accidently leaked urine? Y  Comment Rarely, wears protection when going out.   Do you have problems with loss of bowel control? N  Managing your Medications? N  Managing your Finances? N  Housekeeping or managing your Housekeeping? N  Some recent data might be hidden    Patient Care Team: [name], MD as PCP - General (Family Medicine) Lloyd Huger, MD as Consulting Physician (Oncology) Corey Skains, MD as Consulting Physician (Cardiology) Brendolyn Patty, MD as Consulting Physician (Dermatology) Birder Robson, MD as Referring Physician (Ophthalmology)    Assessment:   This is a routine wellness examination for Barbara Chambers.  Exercise Activities and Dietary recommendations Current Exercise Habits: Structured exercise class, Time (Minutes): 45, Frequency (Times/Week): 1, Weekly Exercise (Minutes/Week): 45, Intensity: Mild, Exercise limited by: None identified  Goals    . Exercise     Starting 06/24/16, I will start back walking 4 days a week for 45 minutes.    . Exercise     Pt plans to start back walking 5 days a week for 1 hour (3 miles).     [name] LIFESTYLE - DECREASE FALLS RISK     Recommend to drink at least 6-8 8oz glasses of water per day.        Fall Risk Fall Risk  07/11/2018 07/08/2017 06/24/2016 12/26/2014  Falls in the past year? 1 No Yes No  Number falls in past yr: 0 - 1 -  Comment - - tripped -  Injury with Fall? 0 - No -  Comment - - bruising -  Follow up Falls prevention discussed - Falls prevention discussed -   FALL RISK  PREVENTION PERTAINING TO THE HOME:  Any stairs in or around the home WITH handrails? No  Home free of loose throw rugs in walkways, pet beds, electrical cords, etc? Yes  Adequate lighting in your home to reduce risk of falls? Yes   ASSISTIVE DEVICES UTILIZED TO PREVENT FALLS:  Life alert? No  Use of a cane, walker or w/c? No  Grab bars in the bathroom? No  Shower chair or bench in shower? No  Elevated toilet seat or a handicapped toilet? Yes    TIMED UP  AND GO:  Was the test performed? No .     Depression Screen PHQ 2/9 Scores 07/11/2018 07/08/2017 07/08/2017 06/24/2016  PHQ - 2 Score 0 0 0 1  PHQ- 9 Score - 0 - -     Cognitive Function: Declined today.      6CIT Screen 06/24/2016  What Year? 0 points  What month? 0 points  What time? 0 points  Count back from 20 0 points  Months in reverse 0 points  Repeat phrase 2 points  Total Score 2    Immunization History  Administered Date(s) Administered  . Influenza, High Dose Seasonal PF 06/24/2016, 04/20/2017, 05/04/2018  . Pneumococcal Conjugate-13 03/30/2014  . Pneumococcal Polysaccharide-23 04/20/2017  . Td 04/05/2017  . Tdap 07/14/2005    Qualifies for Shingles Vaccine? Yes . Due for Shingrix. Education has been provided regarding the importance of this vaccine. Pt has been advised to call insurance company to determine out of pocket expense. Advised may also receive vaccine at local pharmacy or Health Dept. Verbalized acceptance and understanding.  Tdap: Up to date  Flu Vaccine: Up to date  Pneumococcal Vaccine: Up to date  Screening Tests Health Maintenance  Topic Date Due  . TETANUS/TDAP  04/06/2027  . INFLUENZA VACCINE  Completed  . DEXA SCAN  Completed  . PNA vac Low Risk Adult  Completed    Cancer Screenings:  Colorectal Screening: No longer required.   Mammogram: No longer required.   Bone Density: Completed 04/13/13. Results reflect NORMAL.  Lung Cancer Screening: (Low Dose CT Chest  recommended if Age 64-80 years, 30 pack-year currently smoking OR have quit w/in 15years.) does not qualify.    Additional Screening:  Vision Screening: Recommended annual ophthalmology exams for early detection of glaucoma and other disorders of the eye.  Dental Screening: Recommended annual dental exams for proper oral hygiene  Community Resource Referral:  CRR required this visit?  No       Plan:  I have personally reviewed and addressed the Medicare Annual Wellness questionnaire and have noted the following in the patient's chart:  A. Medical and social history B. Use of alcohol, tobacco or illicit drugs  C. Current medications and supplements D. Functional ability and status E.  Nutritional status F.  Physical activity G. Advance directives H. List of other physicians I.  Hospitalizations, surgeries, and ER visits in previous 12 months J.  [place] such as hearing and vision if needed, cognitive and depression L. Referrals and appointments - none  In addition, I have reviewed and discussed with patient certain preventive protocols, quality metrics, and best practice recommendations. A written personalized care plan for preventive services as well as general preventive health recommendations were provided to patient.  See attached scanned questionnaire for additional information.   Signed,  Fabio Neighbors, LPN Nurse Health Advisor   Nurse Recommendations: None.

## 2018-07-12 LAB — COMPREHENSIVE METABOLIC PANEL
ALBUMIN: 4.3 g/dL (ref 3.5–4.7)
ALK PHOS: 102 IU/L (ref 39–117)
ALT: 17 IU/L (ref 0–32)
AST: 28 IU/L (ref 0–40)
Albumin/Globulin Ratio: 1.9 (ref 1.2–2.2)
BUN / CREAT RATIO: 18 (ref 12–28)
BUN: 13 mg/dL (ref 8–27)
Bilirubin Total: 0.6 mg/dL (ref 0.0–1.2)
CO2: 25 mmol/L (ref 20–29)
CREATININE: 0.74 mg/dL (ref 0.57–1.00)
Calcium: 9.3 mg/dL (ref 8.7–10.3)
Chloride: 97 mmol/L (ref 96–106)
GFR calc Af Amer: 88 mL/min/{1.73_m2} (ref >59)
GFR calc non Af Amer: 76 mL/min/{1.73_m2} (ref >59)
GLUCOSE: 89 mg/dL (ref 65–99)
Globulin, Total: 2.3 g/dL (ref 1.5–4.5)
Potassium: 4.1 mmol/L (ref 3.5–5.2)
Sodium: 137 mmol/L (ref 134–144)
Total Protein: 6.6 g/dL (ref 6.0–8.5)

## 2018-07-12 LAB — CBC WITH DIFFERENTIAL/PLATELET
BASOS ABS: 0.1 10*3/uL (ref 0.0–0.2)
BASOS: 0 %
EOS (ABSOLUTE): 0.1 10*3/uL (ref 0.0–0.4)
EOS: 1 %
HEMOGLOBIN: 13.4 g/dL (ref 11.1–15.9)
Hematocrit: 39.2 % (ref 34.0–46.6)
Immature Grans (Abs): 0 10*3/uL (ref 0.0–0.1)
Immature Granulocytes: 0 %
Lymphocytes Absolute: 21.6 10*3/uL — ABNORMAL HIGH (ref 0.7–3.1)
Lymphs: 75 %
MCH: 28.6 pg (ref 26.6–33.0)
MCHC: 34.2 g/dL (ref 31.5–35.7)
MCV: 84 fL (ref 79–97)
MONOS ABS: 2 10*3/uL — AB (ref 0.1–0.9)
Monocytes: 7 %
NEUTROS ABS: 4.8 10*3/uL (ref 1.4–7.0)
NEUTROS PCT: 17 %
Platelets: 248 10*3/uL (ref 150–450)
RBC: 4.68 x10E6/uL (ref 3.77–5.28)
RDW: 12.9 % (ref 11.7–15.4)
WBC: 28.7 10*3/uL (ref 3.4–10.8)

## 2018-07-12 LAB — LIPID PANEL
CHOLESTEROL TOTAL: 190 mg/dL (ref 100–199)
Chol/HDL Ratio: 3.3 ratio (ref 0.0–4.4)
HDL: 58 mg/dL (ref >39)
LDL Calculated: 112 mg/dL — ABNORMAL HIGH (ref 0–99)
TRIGLYCERIDES: 102 mg/dL (ref 0–149)
VLDL Cholesterol Cal: 20 mg/dL (ref 5–40)

## 2018-07-12 LAB — TSH+FREE T4
FREE T4: 1.08 ng/dL (ref 0.82–1.77)
TSH: 5.3 u[IU]/mL — AB (ref 0.450–4.500)

## 2018-07-13 ENCOUNTER — Telehealth: Payer: Self-pay

## 2018-07-13 DIAGNOSIS — E785 Hyperlipidemia, unspecified: Secondary | ICD-10-CM

## 2018-07-13 MED ORDER — SIMVASTATIN 10 MG PO TABS: 10.0000 mg | ORAL_TABLET | Freq: Every day | ORAL | 3 refills | Status: DC

## 2018-07-13 NOTE — Telephone Encounter (Signed)
LMTCB

## 2018-07-13 NOTE — Telephone Encounter (Signed)
Patient has been advised she states she would like to know what dose that you would like to increase patient on? And she request that it be sent to [facility] [address]. KW

## 2018-07-13 NOTE — Telephone Encounter (Signed)
-----   Message from [name], MD sent at 07/13/2018  8:10 AM EST ----- Cholesterol is increasing.  Has patient ever taken a higher dose of simvastatin?  I'd recommend we increase it.  WBC has increased over the last year.  Oncology was notified of enlarged lymph nodes and states if patient is asymptomatic, she can wait to follow-u pin May as scheduled.  Normal kidney function, liver function, electrolytes. Stable Thyroid function

## 2018-07-13 NOTE — Telephone Encounter (Signed)
Patient advised.

## 2018-07-13 NOTE — Telephone Encounter (Signed)
Please let patinet know that it has been increased from 5 to 10mg  daily.  New Rx sent to pharmacy

## 2018-07-25 DIAGNOSIS — L570 Actinic keratosis: Secondary | ICD-10-CM | POA: Diagnosis not present

## 2018-08-24 ENCOUNTER — Encounter: Payer: Self-pay | Admitting: Physician Assistant

## 2018-08-24 ENCOUNTER — Ambulatory Visit (INDEPENDENT_AMBULATORY_CARE_PROVIDER_SITE_OTHER): Payer: PPO | Admitting: Physician Assistant

## 2018-08-24 VITALS — BP 119/67 | HR 94 | Temp 98.1°F | Resp 16 | Wt 138.0 lb

## 2018-08-24 DIAGNOSIS — I10 Essential (primary) hypertension: Secondary | ICD-10-CM | POA: Diagnosis not present

## 2018-08-24 DIAGNOSIS — S81802A Unspecified open wound, left lower leg, initial encounter: Secondary | ICD-10-CM | POA: Diagnosis not present

## 2018-08-24 NOTE — Progress Notes (Signed)
Patient: Barbara Chambers Female    DOB: 1934/08/15   84 y.o.   MRN: [id number] Visit Date: 08/25/2018  Today's Provider: Trinna Post, PA-C   Chief Complaint  Patient presents with  . Leg Injury   Subjective:     HPI Patient here today c/o left leg injury x's 4 days. Patient reports that Saturday night she bumped her leg on a stool, which caused a cut. Patient reports using OTC antibiotic cream. Patient reports she is still seeing some blood around her cut. Patient denies any abnormal discharge. Last tetanus shot 04/05/2017. She denies fever, spreading redness.  HTN:   She reports that sometimes she will feel weak and she will take her BP which will show diastolic in the 19'F. This can happen up to several times in one week. She is currently taking 100 mg losartan daily. She is also taking 12.5 mg hctz.   BP Readings from Last 3 Encounters:  08/24/18 119/67  07/11/18 140/74  07/11/18 140/74     Pulse Readings from Last 3 Encounters:  08/24/18 94  07/11/18 84  07/11/18 84     Allergies  Allergen Reactions  . Penicillins Rash     Current Outpatient Medications:  .  aspirin 81 MG tablet, Take 81 mg by mouth daily., Disp: , Rfl:  .  COENZYME Q-10 PO, Take by mouth daily. , Disp: , Rfl:  .  losartan (COZAAR) 100 MG tablet, Take 1 tablet (100 mg total) by mouth daily., Disp: 90 tablet, Rfl: 3 .  nystatin cream (MYCOSTATIN), Apply 1 application topically 2 (two) times daily., Disp: 60 g, Rfl: 0 .  simvastatin (ZOCOR) 10 MG tablet, Take 1 tablet (10 mg total) by mouth at bedtime., Disp: 90 tablet, Rfl: 3  Review of Systems  Social History   Tobacco Use  . Smoking status: Never Smoker  . Smokeless tobacco: Never Used  Substance Use Topics  . Alcohol use: No      Objective:   BP 119/67 (BP Location: Left Arm, Patient Position: Sitting, Cuff Size: Normal)   Pulse 94   Temp 98.1 F (36.7 C) (Oral)   Resp 16   Wt 138 lb (62.6 kg)   BMI 26.07 kg/m    Vitals:   08/24/18 1515  BP: 119/67  Pulse: 94  Resp: 16  Temp: 98.1 F (36.7 C)  TempSrc: Oral  Weight: 138 lb (62.6 kg)     Physical Exam Constitutional:      Appearance: Normal appearance. She is normal weight.  Cardiovascular:     Rate and Rhythm: Normal rate and regular rhythm.     Pulses: Normal pulses.     Heart sounds: Normal heart sounds.  Pulmonary:     Effort: Pulmonary effort is normal.     Breath sounds: Normal breath sounds.  Skin:    Findings: Wound present.       Neurological:     Mental Status: She is alert and oriented to person, place, and time. Mental status is at baseline.    Media Information   Document Information   Photos  Left leg wound   08/24/2018 15:24  Attached To:  Office Visit on 08/24/18 with Trinna Post, PA-C  Source Information   Paulene Floor  [place]    1. Wound of left lower extremity, initial encounter  Healing well, tetanus up to date. Advised on  several week course of healing. Keep covered, can use some vaseline or antibiotic cream under bandage.  2. Benign essential HTN  She reports dizzy and weak spells with corresponding low Bps. Will have her hold her HCTZ and follow up at existing f/u with PCP. She will continue to take her losartan 100 mg daily.  The entirety of the information documented in the History of Present Illness, Review of Systems and Physical Exam were personally obtained by me. Portions of this information were initially documented by Lynford Humphrey, CMA and reviewed by me for thoroughness and accuracy.   Return in about 3 months (around 11/22/2018) for HTN.       Trinna Post, PA-C  [facility]

## 2018-08-24 NOTE — Patient Instructions (Signed)

## 2018-08-29 DIAGNOSIS — L570 Actinic keratosis: Secondary | ICD-10-CM | POA: Diagnosis not present

## 2018-10-31 DIAGNOSIS — R9431 Abnormal electrocardiogram [ECG] [EKG]: Secondary | ICD-10-CM | POA: Diagnosis not present

## 2018-10-31 DIAGNOSIS — E782 Mixed hyperlipidemia: Secondary | ICD-10-CM | POA: Diagnosis not present

## 2018-10-31 DIAGNOSIS — I10 Essential (primary) hypertension: Secondary | ICD-10-CM | POA: Diagnosis not present

## 2018-11-10 ENCOUNTER — Other Ambulatory Visit: Payer: Self-pay

## 2018-11-10 ENCOUNTER — Inpatient Hospital Stay: Payer: PPO | Attending: Oncology

## 2018-11-10 DIAGNOSIS — C9110 Chronic lymphocytic leukemia of B-cell type not having achieved remission: Secondary | ICD-10-CM

## 2018-11-10 LAB — CBC WITH DIFFERENTIAL/PLATELET
Abs Immature Granulocytes: 0.05 10*3/uL (ref 0.00–0.07)
Basophils Absolute: 0.1 10*3/uL (ref 0.0–0.1)
Basophils Relative: 0 %
Eosinophils Absolute: 0.1 10*3/uL (ref 0.0–0.5)
Eosinophils Relative: 0 %
HCT: 39.3 % (ref 36.0–46.0)
Hemoglobin: 12.7 g/dL (ref 12.0–15.0)
Immature Granulocytes: 0 %
Lymphocytes Relative: 80 %
Lymphs Abs: 22.5 10*3/uL — ABNORMAL HIGH (ref 0.7–4.0)
MCH: 27.5 pg (ref 26.0–34.0)
MCHC: 32.3 g/dL (ref 30.0–36.0)
MCV: 85.1 fL (ref 80.0–100.0)
Monocytes Absolute: 1 10*3/uL (ref 0.1–1.0)
Monocytes Relative: 3 %
Neutro Abs: 4.9 10*3/uL (ref 1.7–7.7)
Neutrophils Relative %: 17 %
Platelets: 256 10*3/uL (ref 150–400)
RBC: 4.62 MIL/uL (ref 3.87–5.11)
RDW: 13.2 % (ref 11.5–15.5)
Smear Review: NORMAL
WBC Morphology: ABNORMAL
WBC: 28.5 10*3/uL — ABNORMAL HIGH (ref 4.0–10.5)
nRBC: 0 % (ref 0.0–0.2)

## 2018-11-14 ENCOUNTER — Telehealth: Payer: Self-pay | Admitting: *Deleted

## 2018-11-14 LAB — COMP PANEL: LEUKEMIA/LYMPHOMA: Immunophenotypic Profile: 61

## 2018-11-14 NOTE — Telephone Encounter (Signed)
Call received from [facility] regarding testing that was ordered. 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 I also received message from the pathologist at [facility] requesting call from you to discuss, CB # [phone], Dr. Bynum Bellows.

## 2018-11-15 NOTE — Telephone Encounter (Signed)
Do you mind faxing this back to [facility] if it gets sent tomorrow. Thanks so much.

## 2018-11-15 NOTE — Telephone Encounter (Signed)
Yes to CLL FISH if sample is still viable.  I talked with Dr. Bynum Bellows, they should fax over an order.  Thanks!

## 2018-11-16 ENCOUNTER — Other Ambulatory Visit: Payer: Self-pay

## 2018-11-17 ENCOUNTER — Inpatient Hospital Stay (HOSPITAL_BASED_OUTPATIENT_CLINIC_OR_DEPARTMENT_OTHER): Payer: PPO | Admitting: Oncology

## 2018-11-17 DIAGNOSIS — Z79899 Other long term (current) drug therapy: Secondary | ICD-10-CM | POA: Diagnosis not present

## 2018-11-17 DIAGNOSIS — C9110 Chronic lymphocytic leukemia of B-cell type not having achieved remission: Secondary | ICD-10-CM | POA: Diagnosis not present

## 2018-11-17 DIAGNOSIS — Z7982 Long term (current) use of aspirin: Secondary | ICD-10-CM

## 2018-11-17 NOTE — Progress Notes (Signed)
Patient denies any concerns today.  

## 2018-11-17 NOTE — Progress Notes (Signed)
[facility]  [phone]  [phone]     Barbara Chambers DOB: 1934/11/29  MR#: [id number]  TKW#:[id number]  Patient Care Team: [name], MD as PCP - General (Family Medicine) Lloyd Huger, MD as Consulting Physician (Oncology) Corey Skains, MD as Consulting Physician (Cardiology) Brendolyn Patty, MD as Consulting Physician (Dermatology) Birder Robson, MD as Referring Physician (Ophthalmology)  I connected with Barbara Chambers on 11/20/18 at 11:15 AM EDT by telephone visit and verified that I am speaking with the correct person using two identifiers.   I discussed the limitations, risks, security and privacy concerns of performing an evaluation and management service by telemedicine and the availability of in-person appointments. I also discussed with the patient that there may be a patient responsible charge related to this service. The patient expressed understanding and agreed to proceed.   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  INTERVAL HISTORY: Patient agreed to telephone visit for her routine yearly evaluation and discussion of her laboratory work.  She continues to feel well and remains asymptomatic. She denies any fevers, night sweats, or weight loss. She has no neurologic complaints.  She denies any chest pain, shortness of breath, cough, or hemoptysis.  She denies any nausea, vomiting, constipation, or diarrhea. She has no urinary complaints.  Patient feels at her baseline offers no specific complaints today.  REVIEW OF SYSTEMS:   Review of Systems  Constitutional: Negative.  Negative for diaphoresis, fever, malaise/fatigue and weight loss.  Respiratory: Negative.  Negative for cough and shortness of breath.   Cardiovascular:  Negative.  Negative for chest pain and leg swelling.  Gastrointestinal: Negative.  Negative for abdominal pain.  Genitourinary: Negative.  Negative for dysuria.  Musculoskeletal: Negative.  Negative for back pain.  Skin: Negative.  Negative for rash.  Neurological: Negative.  Negative for dizziness, sensory change, focal weakness, weakness and headaches.  Psychiatric/Behavioral: Negative.  Negative for depression. The patient is not nervous/anxious.     As per HPI. Otherwise, a complete review of systems is negative.  PAST MEDICAL HISTORY: Past Medical History:  Diagnosis Date  . Cancer [facility]) 2011   lymphoma  . Hyperlipidemia   . Hypertension     PAST SURGICAL HISTORY: Past Surgical History:  Procedure Laterality Date  . cataract surgery Bilateral 2005  . CHOLECYSTECTOMY  1991  . RECONSTRUCTION OF EYELID      FAMILY HISTORY Family History  Problem Relation Age of Onset  . Hypertension Mother   . Stroke Mother   . CAD Mother   . Hypertension Father   . Heart attack Father   . Hypertension Brother   . Seizures Brother   . Hypertension Son   . Breast cancer Neg Hx     GYNECOLOGIC HISTORY:  No LMP recorded. Patient is postmenopausal.     ADVANCED DIRECTIVES:    HEALTH MAINTENANCE: Social History   Tobacco Use  . Smoking status: Never Smoker  . Smokeless tobacco: Never Used  Substance Use Topics  . Alcohol use: No  . Drug use: No     Colonoscopy:  PAP:  Bone density:  Lipid panel:  Allergies  Allergen Reactions  . Penicillins Rash    Current Outpatient Medications  Medication Sig Dispense Refill  . aspirin 81 MG tablet Take 81 mg by mouth daily.    [name]  COENZYME Q-10 PO Take by mouth daily.     . hydrochlorothiazide (MICROZIDE) 12.5 MG capsule Take 12.5 mg by mouth daily.    [name] losartan (COZAAR) 100 MG tablet Take 100 mg by mouth daily.    . simvastatin (ZOCOR) 10 MG tablet Take 1 tablet (10 mg total) by mouth at bedtime. 90 tablet 3   No current  facility-administered medications for this visit.     OBJECTIVE: There were no vitals taken for this visit.   There is no height or weight on file to calculate BMI.    ECOG FS:0 - Asymptomatic   LAB RESULTS:  No visits with results within 3 Day(s) from this visit.  Latest known visit with results is:  Appointment on 11/10/2018  Component Date Value Ref Range Status  . PATH INTERP XXX-IMP 11/10/2018 Comment   Final   Comment: (NOTE) Bimondal CD5+, CD23+ B cell lymphoproliferative disorder detected, favor CLL/SLL phenotype. (See comment.)   . ANNOTATION COMMENT IMP 11/10/2018 Comment   Corrected   Comment: (NOTE) Findings are consistent with a bimodal CD5+ B-cell lymphoproliferative disorder. The phenotype favors a somewhat atypical CLL over mantle cell lymphoma, the two most common CD5+ B cell lymphoproliferative disorders. However, clinical, morphologic and cytogenetic correlation, which includes FISH to detect the t(11;14) translocation present in most MCLs, is necessary for further subclassification, if clinically indicated.   [name] CLINICAL INFO 11/10/2018 Comment   Corrected   Comment: (NOTE) Accompanying CBC dated 11/10/2018 shows: WBC count 28.5, Lym% 80, Neu 4.9, Lym 22.5, Mon 1.0.   . Specimen Type 11/10/2018 Comment   Final   Peripheral blood  . ASSESSMENT OF LEUKOCYTES 11/10/2018 Comment   Final   Comment: (NOTE) A bimodal CD5+ (dim and brighter), CD23+, bimodal CD20+ (dim and brighter), bimodal FMC7 (negative and positive), CD38- (1%) monoclonal B cell population is detected with dim lambda light chain restriction representing >99% of B cells and 61% of leukocytes. There is no loss of, or aberrant expression of, the pan T cell antigens to suggest a neoplastic T cell process. CD4:CD8 ratio 1.0 No circulating blasts are detected. There is no immunophenotypic evidence of abnormal myeloid maturation. Analysis of the leukocyte population shows: granulocytes 21%,  monocytes 2%, lymphocytes 77%, blasts <0.1%, B cells 61%, T cells 15%, NK cells 1%.   . % Viable Cells 11/10/2018 Comment   Corrected   98%  . Immunophenotypic Profile 11/10/2018 61% of total cells (Phenotype below)   Corrected   Comment: Comment Abnormal cell population: present   . ANALYSIS AND GATING STRATEGY 11/10/2018 Comment   Final   8 color analysis with CD45/SSC gating  . IMMUNOPHENOTYPING STUDY 11/10/2018 Comment   Final   Comment: (NOTE) CD2       (-)            CD3       (-) CD4       (-)            CD5       See Text CD7       (-)            CD8       (-) CD10      (-)            CD11b     (-) CD11c     (+)            CD13      (-) CD14      (-)  CD15      (-) CD16      (-)            CD19      (+) CD20      See Text       CD22      (+) Dim CD23      (+)            CD33      (-) CD34      (-)            CD38      (-) CD45      (+)            CD56      (-) CD57      (-)            CD103     (-) CD117     (-)            FMC-7     See Text HLA-DR    (+)            KAPPA     (-) LAMBDA    (+) Dim        CD64      (-)   . PATHOLOGIST NAME 11/10/2018 Comment   Final   Lovett Sox, M.D.  . COMMENT: 11/10/2018 Comment   Corrected   Comment: (NOTE) Each antibody in this assay was utilized to assess for potential abnormalities of studied cell populations or to characterize identified abnormalities. This test was developed and its performance characteristics determined by [facility].  It has not been cleared or approved by the U.S. Food and Drug Administration. The FDA has determined that such clearance or approval is not necessary. This test is used for clinical purposes.  It should not be regarded as investigational or for research. Performed At: -[facility] [place], [place] [id number] Nechama Guard MD TK:[id number] Performed At: [facility] [address] [place], [place] [id number] Nechama Guard MD DQ:[id number]    . WBC 11/10/2018 28.5* 4.0 - 10.5 K/uL Final  . RBC 11/10/2018 4.62  3.87 - 5.11 MIL/uL Final  . Hemoglobin 11/10/2018 12.7  12.0 - 15.0 g/dL Final  . HCT 11/10/2018 39.3  36.0 - 46.0 % Final  . MCV 11/10/2018 85.1  80.0 - 100.0 fL Final  . MCH 11/10/2018 27.5  26.0 - 34.0 pg Final  . MCHC 11/10/2018 32.3  30.0 - 36.0 g/dL Final  . RDW 11/10/2018 13.2  11.5 - 15.5 % Final  . Platelets 11/10/2018 256  150 - 400 K/uL Final  . nRBC 11/10/2018 0.0  0.0 - 0.2 % Final  . Neutrophils Relative % 11/10/2018 17  % Final  . Neutro Abs 11/10/2018 4.9  1.7 - 7.7 K/uL Final  . Lymphocytes Relative 11/10/2018 80  % Final  . Lymphs Abs 11/10/2018 22.5* 0.7 - 4.0 K/uL Final  . Monocytes Relative 11/10/2018 3  % Final  . Monocytes Absolute 11/10/2018 1.0  0.1 - 1.0 K/uL Final  . Eosinophils Relative 11/10/2018 0  % Final  . Eosinophils Absolute 11/10/2018 0.1  0.0 - 0.5 K/uL Final  . Basophils Relative 11/10/2018 0  % Final  . Basophils Absolute 11/10/2018 0.1  0.0 - 0.1 K/uL Final  . WBC Morphology 11/10/2018 Abnormal lymphocytes present   Final   CONSISTANT WITH KNOWN CLL  . RBC Morphology 11/10/2018 UNREMARKABLE   Final  .  Smear Review 11/10/2018 Normal platelet morphology   Final   PLATELETS APPEAR ADEQUATE  . Immature Granulocytes 11/10/2018 0  % Final  . Abs Immature Granulocytes 11/10/2018 0.05  0.00 - 0.07 K/uL Final  . Smudge Cells 11/10/2018 PRESENT   Final   Performed at [facility], [address] [place], [place] [address]    STUDIES: No results found.  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  PLAN:    1. 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  Patient's white blood cell count remains mildly elevated at 28.5, but this is unchanged since at least July 2016. Patient has no evidence of endorgan damage such as thrombocytopenia or anemia.  No intervention is  needed at this time.  Patient does not require bone marrow biopsy. Can consider imaging in the future, but this is not necessary at this point either.  Patient continues to report request less frequent follow-up, therefore she will return to clinic in 1 year with repeat laboratory work and further evaluation.  I provided 15 minutes of non face-to-face telephone visit time during this encounter, and > 50% was spent counseling as documented under my assessment & plan.  Patient expressed understanding and was in agreement with this plan. She also understands that She can call clinic at any time with any questions, concerns, or complaints.    Lloyd Huger, MD   11/20/2018 8:56 AM

## 2018-11-22 DIAGNOSIS — L821 Other seborrheic keratosis: Secondary | ICD-10-CM | POA: Diagnosis not present

## 2018-11-22 DIAGNOSIS — T07XXXA Unspecified multiple injuries, initial encounter: Secondary | ICD-10-CM | POA: Diagnosis not present

## 2018-11-22 DIAGNOSIS — L812 Freckles: Secondary | ICD-10-CM | POA: Diagnosis not present

## 2018-11-22 DIAGNOSIS — L570 Actinic keratosis: Secondary | ICD-10-CM | POA: Diagnosis not present

## 2018-11-22 DIAGNOSIS — L578 Other skin changes due to chronic exposure to nonionizing radiation: Secondary | ICD-10-CM | POA: Diagnosis not present

## 2018-11-22 DIAGNOSIS — Z85828 Personal history of other malignant neoplasm of skin: Secondary | ICD-10-CM | POA: Diagnosis not present

## 2018-11-23 LAB — FISH HES LEUKEMIA, 4Q12 REA

## 2018-11-28 ENCOUNTER — Telehealth: Payer: Self-pay

## 2018-11-28 MED ORDER — TRIAMCINOLONE ACETONIDE 0.1 % EX CREA: 1.0000 "application " | TOPICAL_CREAM | Freq: Two times a day (BID) | CUTANEOUS | 0 refills | Status: DC

## 2018-11-28 NOTE — Telephone Encounter (Signed)
Please set up an evisit - video or telephone is fine

## 2018-11-28 NOTE — Telephone Encounter (Signed)
[facility] called and advised pt called them and stated that pt has bed bugs and has bites and is itching.  She has a exterminator coming in and she is asking if there is something she can get for the itching and the bites.

## 2018-11-28 NOTE — Telephone Encounter (Signed)
Attempted to contact patient on home and cell number, no answer or voicemail. Patient needs to schedule a e-visit or telephone visit.

## 2018-11-28 NOTE — Telephone Encounter (Signed)
She can use triamcinolone cream twice daily on the bites that are itching she should watch for any signs of infection including purulent drainage and spreading redness site, as well as fever.

## 2018-11-28 NOTE — Telephone Encounter (Signed)
Spoke to patient regarding bites. She states that some of the bites are infected, having white heads filled with pus. They are not draining yet, but is requesting something stronger than the cream. She states she is very miserable, and weak. Bites are all over back, legs, and abdomen. Please advise.

## 2018-12-02 ENCOUNTER — Emergency Department
Admission: EM | Admit: 2018-12-02 | Discharge: 2018-12-02 | Disposition: A | Discharge status: Home / Self Care | Payer: PPO | Attending: Emergency Medicine | Admitting: Emergency Medicine

## 2018-12-02 ENCOUNTER — Emergency Department: Payer: PPO

## 2018-12-02 DIAGNOSIS — I10 Essential (primary) hypertension: Secondary | ICD-10-CM | POA: Diagnosis not present

## 2018-12-02 DIAGNOSIS — R531 Weakness: Secondary | ICD-10-CM

## 2018-12-02 DIAGNOSIS — R0602 Shortness of breath: Secondary | ICD-10-CM

## 2018-12-02 DIAGNOSIS — Z859 Personal history of malignant neoplasm, unspecified: Secondary | ICD-10-CM | POA: Insufficient documentation

## 2018-12-02 DIAGNOSIS — Z20828 Contact with and (suspected) exposure to other viral communicable diseases: Secondary | ICD-10-CM | POA: Diagnosis not present

## 2018-12-02 LAB — CBC
HCT: 40.8 % (ref 36.0–46.0)
Hemoglobin: 13.1 g/dL (ref 12.0–15.0)
MCH: 27.3 pg (ref 26.0–34.0)
MCHC: 32.1 g/dL (ref 30.0–36.0)
MCV: 85.2 fL (ref 80.0–100.0)
Platelets: 303 10*3/uL (ref 150–400)
RBC: 4.79 MIL/uL (ref 3.87–5.11)
RDW: 13.2 % (ref 11.5–15.5)
WBC: 31.9 10*3/uL — ABNORMAL HIGH (ref 4.0–10.5)
nRBC: 0 % (ref 0.0–0.2)

## 2018-12-02 LAB — URINALYSIS, COMPLETE (UACMP) WITH MICROSCOPIC
Bacteria, UA: NONE SEEN
Bilirubin Urine: NEGATIVE
Glucose, UA: NEGATIVE mg/dL
Hgb urine dipstick: NEGATIVE
Ketones, ur: NEGATIVE mg/dL
Nitrite: NEGATIVE
Protein, ur: NEGATIVE mg/dL
Specific Gravity, Urine: 1.012 (ref 1.005–1.030)
pH: 7 (ref 5.0–8.0)

## 2018-12-02 LAB — BASIC METABOLIC PANEL
Anion gap: 11 (ref 5–15)
BUN: 13 mg/dL (ref 8–23)
CO2: 24 mmol/L (ref 22–32)
Calcium: 8.9 mg/dL (ref 8.9–10.3)
Chloride: 99 mmol/L (ref 98–111)
Creatinine, Ser: 0.59 mg/dL (ref 0.44–1.00)
GFR calc Af Amer: >60 mL/min (ref >60)
GFR calc non Af Amer: >60 mL/min (ref >60)
Glucose, Bld: 119 mg/dL — ABNORMAL HIGH (ref 70–99)
Potassium: 3.5 mmol/L (ref 3.5–5.1)
Sodium: 134 mmol/L — ABNORMAL LOW (ref 135–145)

## 2018-12-02 LAB — TROPONIN I: Troponin I: <0.03 ng/mL (ref <0.03)

## 2018-12-02 MED ORDER — LORAZEPAM 0.5 MG PO TABS: 0.5000 mg | ORAL_TABLET | Freq: Every day | ORAL | 0 refills | Status: DC | PRN

## 2018-12-02 NOTE — ED Triage Notes (Addendum)
Patient c/o hypertension (BP 180/88) and tachycardia (HR 106). Patient c/o weakness. Patient reports she is stressed because she has bedbugs at her house.

## 2018-12-02 NOTE — ED Notes (Signed)
Pt awaiting son to pick up

## 2018-12-02 NOTE — ED Provider Notes (Signed)
[facility] Emergency Department Provider Note       Time seen: ----------------------------------------- 7:20 AM on 12/02/2018 -----------------------------------------   I have reviewed the triage vital signs and the nursing notes.  HISTORY   Chief Complaint Hypertension and Weakness    HPI Barbara Chambers is a 84 y.o. female with a history of lymphoma, hypertension, hyperlipidemia who presents to the ED for weakness.  Patient has noted hypertension and tachycardia when she checked her blood pressure at home.  Patient complains of weakness, reports she is stressed because she has seen bedbugs in her house.  For the last week or so she has been dealing with this and has had her house fumigated.  She checked her blood pressure at home and it was found to be elevated.  She has not had any other symptoms or complaints.  Past Medical History:  Diagnosis Date  . Cancer [facility]) 2011   lymphoma  . Hyperlipidemia   . Hypertension     Patient Active Problem List   Diagnosis Date Noted  . Shortness of breath 07/11/2018  . Vulvar irritation 07/11/2018  . Dizziness 05/18/2017  . Leg cramps 04/20/2017  . Arthritis 03/08/2015  . Anxiety 10/25/2014  . CLL (chronic lymphocytic leukemia) ([place]) 10/25/2014  . HLD (hyperlipidemia) 10/25/2014  . Subclinical hypothyroidism 10/25/2014  . Abnormal ECG 10/11/2014  . Benign essential HTN 10/11/2014    Past Surgical History:  Procedure Laterality Date  . cataract surgery Bilateral 2005  . CHOLECYSTECTOMY  1991  . RECONSTRUCTION OF EYELID      Allergies Penicillins  Social History Social History   Tobacco Use  . Smoking status: Never Smoker  . Smokeless tobacco: Never Used  Substance Use Topics  . Alcohol use: No  . Drug use: No   Review of Systems Constitutional: Negative for fever. Cardiovascular: Negative for chest pain. Respiratory: Negative for shortness of breath. Gastrointestinal: Negative for  abdominal pain, vomiting and diarrhea. Musculoskeletal: Negative for back pain. Skin: Negative for rash. Neurological: Negative for headaches, focal weakness or numbness.  All systems negative/normal/unremarkable except as stated in the HPI  ____________________________________________   PHYSICAL EXAM:  VITAL SIGNS: ED Triage Vitals  Enc Vitals Group     BP 12/02/18 0658 (!) 158/84     Pulse Rate 12/02/18 0658 96     Resp 12/02/18 0658 18     Temp 12/02/18 0658 98.3 F (36.8 C)     Temp src --      SpO2 12/02/18 0658 97 %     Weight 12/02/18 0656 135 lb (61.2 kg)     Height 12/02/18 0656 5\' 2"  (1.575 m)     Head Circumference --      Peak Flow --      Pain Score 12/02/18 0656 0     Pain Loc --      Pain Edu? --      Excl. in [place]? --    Constitutional: Alert and oriented. Well appearing and in no distress. Eyes: Conjunctivae are normal. Normal extraocular movements. Cardiovascular: Normal rate, regular rhythm. No murmurs, rubs, or gallops. Respiratory: Normal respiratory effort without tachypnea nor retractions. Breath sounds are clear and equal bilaterally. No wheezes/rales/rhonchi. Gastrointestinal: Soft and nontender. Normal bowel sounds Musculoskeletal: Nontender with normal range of motion in extremities. No lower extremity tenderness nor edema. Neurologic:  Normal speech and language. No gross focal neurologic deficits are appreciated.  Skin:  Skin is warm, dry and intact. No rash noted. Psychiatric: Mood and  affect are normal. Speech and behavior are normal.  ____________________________________________  EKG: Interpreted by me.  Sinus rhythm with a rate of 97 bpm, left axis deviation, normal ST segments, normal QT  ____________________________________________  ED COURSE:  As part of my medical decision making, I reviewed the following data within the [facility] History obtained from family if available, nursing notes, old chart and ekg, as well as  notes from prior ED visits. Patient presented for weakness with hypertension and tachycardia with recent stressors, we will assess with labs and imaging as indicated at this time.   Procedures  Barbara Chambers was evaluated in Emergency Department on 12/02/2018 for the symptoms described in the history of present illness. She was evaluated in the context of the global COVID-19 pandemic, which necessitated consideration that the patient might be at risk for infection with the SARS-CoV-2 virus that causes COVID-19. Institutional protocols and algorithms that pertain to the evaluation of patients at risk for COVID-19 are in a state of rapid change based on information released by regulatory bodies including the CDC and federal and state organizations. These policies and algorithms were followed during the patient's care in the ED.  ____________________________________________   LABS (pertinent positives/negatives)  Labs Reviewed  BASIC METABOLIC PANEL - Abnormal; Notable for the following components:      Result Value   Sodium 134 (*)    Glucose, Bld 119 (*)    All other components within normal limits  CBC - Abnormal; Notable for the following components:   WBC 31.9 (*)    All other components within normal limits  URINALYSIS, COMPLETE (UACMP) WITH MICROSCOPIC - Abnormal; Notable for the following components:   Color, Urine YELLOW (*)    APPearance CLEAR (*)    Leukocytes,Ua TRACE (*)    All other components within normal limits  TROPONIN I    RADIOLOGY Images were viewed by me  Chest x-ray IMPRESSION: No acute cardiopulmonary disease.  ____________________________________________   DIFFERENTIAL DIAGNOSIS   Anxiety, stress, hypertension, dehydration, electrolyte abnormality, occult infection  FINAL ASSESSMENT AND PLAN  Weakness   Plan: The patient had presented for generalized weakness with recent stressors. Patient's labs did not reveal any acute process, she has known CLL.  Patient's imaging was reassuring.  Unclear etiology of her symptoms other than stress.  She will be given Ativan to try as needed.  She is cleared for outpatient follow-up.   Laurence Aly, MD    Note: This note was generated in part or whole with voice recognition software. Voice recognition is usually quite accurate but there are transcription errors that can and very often do occur. I apologize for any typographical errors that were not detected and corrected.     Earleen Newport, MD 12/02/18 [id number]

## 2018-12-06 ENCOUNTER — Encounter: Payer: Self-pay | Admitting: Family Medicine

## 2018-12-06 ENCOUNTER — Ambulatory Visit (INDEPENDENT_AMBULATORY_CARE_PROVIDER_SITE_OTHER): Payer: PPO | Admitting: Family Medicine

## 2018-12-06 VITALS — BP 132/61 | HR 82

## 2018-12-06 DIAGNOSIS — E785 Hyperlipidemia, unspecified: Secondary | ICD-10-CM

## 2018-12-06 DIAGNOSIS — I10 Essential (primary) hypertension: Secondary | ICD-10-CM | POA: Diagnosis not present

## 2018-12-06 DIAGNOSIS — W57XXXD Bitten or stung by nonvenomous insect and other nonvenomous arthropods, subsequent encounter: Secondary | ICD-10-CM | POA: Diagnosis not present

## 2018-12-06 DIAGNOSIS — F4322 Adjustment disorder with anxiety: Secondary | ICD-10-CM | POA: Diagnosis not present

## 2018-12-06 DIAGNOSIS — R531 Weakness: Secondary | ICD-10-CM

## 2018-12-06 NOTE — Assessment & Plan Note (Signed)
Well controlled  Continue current meds Reviewed recent CMP F/u in 6 months

## 2018-12-06 NOTE — Assessment & Plan Note (Signed)
Continue Zocor Reviewed last lipid panel Discussed aspirin use and given advanced age and bleeding risks, will stop (also reviewed Dr Alveria Apley last note regarding this with patient)

## 2018-12-06 NOTE — Progress Notes (Signed)
Patient: Barbara Chambers Female    DOB: 1935/05/28   84 y.o.   MRN: [id number] Visit Date: 12/06/2018  Today's Provider: Lavon Paganini, MD   Chief Complaint  Patient presents with  . ER follow up   Subjective:    Virtual Visit via Telephone Note  I connected with Barbara Chambers on 12/06/18 at  8:40 AM EDT by telephone and verified that I am speaking with the correct person using two identifiers.   Patient location: home Provider location: [place] involved in the visit: patient, provider   I discussed the limitations, risks, security and privacy concerns of performing an evaluation and management service by telephone and the availability of in person appointments. I also discussed with the patient that there may be a patient responsible charge related to this service. The patient expressed understanding and agreed to proceed.  HPI  Follow up ER visit  Patient was seen in ER for weakness on 12/02/2018. She was treated for weakness & anxiety. Treatment for this included started Lorazepam 0.5 mg daily. She reports good compliance with treatment. She reports this condition is Improved.  States she has never had 2 wks this bad since her son died.  She was stressed by having bed bugs and having to have it fumigated  States that she only took Ativan for 1-2 days.  She is fearful of becoming addicted.  States that now that she is on the other side of this stressful situation, things are much better. She states, "I can smile now."  States that bed bug bites are improving.  She was worried that something may have been infected. ------------------------------------------------------------------------------------   Allergies  Allergen Reactions  . Penicillins Rash     Current Outpatient Medications:  .  COENZYME Q-10 PO, Take by mouth daily. , Disp: , Rfl:  .  hydrochlorothiazide (MICROZIDE) 12.5 MG capsule, Take 12.5 mg by mouth daily., Disp: ,  Rfl:  .  losartan (COZAAR) 100 MG tablet, Take 100 mg by mouth daily., Disp: , Rfl:  .  simvastatin (ZOCOR) 10 MG tablet, Take 1 tablet (10 mg total) by mouth at bedtime., Disp: 90 tablet, Rfl: 3 .  triamcinolone cream (KENALOG) 0.1 %, Apply 1 application topically 2 (two) times daily., Disp: 30 g, Rfl: 0  Review of Systems  Constitutional: Positive for fatigue.  Respiratory: Negative.   Cardiovascular: Negative.   Musculoskeletal: Negative.   Neurological: Positive for weakness.  Psychiatric/Behavioral: The patient is nervous/anxious.     Social History   Tobacco Use  . Smoking status: Never Smoker  . Smokeless tobacco: Never Used  Substance Use Topics  . Alcohol use: No      Objective:   BP 132/61   Pulse 82  Vitals:   12/06/18 0843  BP: 132/61  Pulse: 82     Physical Exam      Assessment & Plan    I discussed the assessment and treatment plan with the patient. The patient was provided an opportunity to ask questions and all were answered. The patient agreed with the plan and demonstrated an understanding of the instructions.   The patient was advised to call back or seek an in-person evaluation if the symptoms worsen or if the condition fails to improve as anticipated.  1. Adjustment disorder with anxious mood - now well controlled - stress reaction to bed bugs and fumigation - doing much better now - d/c Ativan - return precautions discussed  2.  Generalized weakness - new problem, but now resolved - after w/u in ED, seems to have been related to stress reaction and anxiety as above - return precautions discussed  3. Bedbug bite, subsequent encounter - no infection noted in ED - reassured that using topical steroids is typical treatment for these bites and they will slowly heal  Problem List Items Addressed This Visit      Cardiovascular and Mediastinum   Benign essential HTN    Well controlled  Continue current meds Reviewed recent CMP F/u in 6  months        Other   HLD (hyperlipidemia)    Continue Zocor Reviewed last lipid panel Discussed aspirin use and given advanced age and bleeding risks, will stop (also reviewed Dr Alveria Apley last note regarding this with patient)       Other Visit Diagnoses    Adjustment disorder with anxious mood    -  Primary   Generalized weakness       Bedbug bite, subsequent encounter           Return if symptoms worsen or fail to improve.   The entirety of the information documented in the History of Present Illness, Review of Systems and Physical Exam were personally obtained by me. Portions of this information were initially documented by [name], CMA and reviewed by me for thoroughness and accuracy.    Elric Tirado, [name], MD MPH [facility]

## 2018-12-19 ENCOUNTER — Telehealth: Payer: Self-pay

## 2018-12-19 DIAGNOSIS — L304 Erythema intertrigo: Secondary | ICD-10-CM

## 2018-12-19 MED ORDER — TRIAMCINOLONE ACETONIDE 0.1 % EX CREA: 1.0000 "application " | TOPICAL_CREAM | Freq: Two times a day (BID) | CUTANEOUS | 0 refills | Status: DC

## 2018-12-19 MED ORDER — NYSTATIN 100000 UNIT/GM EX CREA: 1.0000 "application " | TOPICAL_CREAM | Freq: Two times a day (BID) | CUTANEOUS | 0 refills | Status: DC

## 2018-12-19 NOTE — Telephone Encounter (Signed)
Patient was advised.  

## 2018-12-19 NOTE — Addendum Note (Signed)
Addended by: Julieta Bellini on: 12/19/2018 11:17 AM   Modules accepted: Orders

## 2018-12-19 NOTE — Telephone Encounter (Signed)
Sent in nystatin cream to [facility]. Mix with triamcinolone cream and apply to affected area twice daily

## 2018-12-19 NOTE — Telephone Encounter (Signed)
Patient contacting office requesting for CMA to give her call back in regards to rash under her breast. Patient states that she had a virtual visit with Dr. [name] last week about rash ( according to chart it looks like 12/06/18). Patient states that she had a exterminator come out to her home and spray when she initially broke out in rash, she states that she was prescribed a cream by Dr. [name] but she does not believe it is clearing up and would like to speak with CMA. KW

## 2018-12-26 ENCOUNTER — Ambulatory Visit (INDEPENDENT_AMBULATORY_CARE_PROVIDER_SITE_OTHER): Payer: PPO | Admitting: Family Medicine

## 2018-12-26 ENCOUNTER — Encounter: Payer: Self-pay | Admitting: Family Medicine

## 2018-12-26 DIAGNOSIS — L299 Pruritus, unspecified: Secondary | ICD-10-CM | POA: Diagnosis not present

## 2018-12-26 DIAGNOSIS — L309 Dermatitis, unspecified: Secondary | ICD-10-CM

## 2018-12-26 DIAGNOSIS — W57XXXD Bitten or stung by nonvenomous insect and other nonvenomous arthropods, subsequent encounter: Secondary | ICD-10-CM

## 2018-12-26 MED ORDER — PREDNISONE 10 MG PO TABS: ORAL_TABLET | ORAL | 0 refills | Status: DC

## 2018-12-26 NOTE — Progress Notes (Signed)
Patient: Barbara Chambers Female    DOB: 21-Nov-1934   84 y.o.   MRN: [id number] Visit Date: 12/26/2018  Today's Provider: Lavon Paganini, MD   Chief Complaint  Patient presents with  . Rash    bed bugs   Subjective:    Virtual Visit via Telephone Note  I connected with Barbara Chambers on 12/26/18 at  3:40 PM EDT by telephone and verified that I am speaking with the correct person using two identifiers.  Patient location: home Provider location: [place] involved in the visit: patient, provider   I discussed the limitations, risks, security and privacy concerns of performing an evaluation and management service by telephone and the availability of in person appointments. I also discussed with the patient that there may be a patient responsible charge related to this service. The patient expressed understanding and agreed to proceed.   Rash This is a new problem. The problem has been gradually worsening since onset. The affected locations include the back, left arm, right arm, right lower leg, right upper leg, left lower leg and left upper leg (under breast down abdomen). The rash is characterized by redness and itchiness. Treatments tried: Kenalog cream and Nystatin cream. The treatment provided no relief.   States that exterminator came out and thought it was extinguished.  Another exterminator came out  Allergies  Allergen Reactions  . Penicillins Rash     Current Outpatient Medications:  .  COENZYME Q-10 PO, Take by mouth daily. , Disp: , Rfl:  .  hydrochlorothiazide (MICROZIDE) 12.5 MG capsule, Take 12.5 mg by mouth daily., Disp: , Rfl:  .  losartan (COZAAR) 100 MG tablet, Take 100 mg by mouth daily., Disp: , Rfl:  .  nystatin cream (MYCOSTATIN), Apply 1 application topically 2 (two) times daily., Disp: 30 g, Rfl: 0 .  simvastatin (ZOCOR) 10 MG tablet, Take 1 tablet (10 mg total) by mouth at bedtime., Disp: 90 tablet, Rfl: 3 .  triamcinolone  cream (KENALOG) 0.1 %, Apply 1 application topically 2 (two) times daily., Disp: 30 g, Rfl: 0 .  predniSONE (DELTASONE) 10 MG tablet, Take 40 mg daily x3 days, then 30mg  daily x3 days, then 20mg  daily x3d, then 10mg  daily x3d, then 5mg  daily x4days, Disp: 32 tablet, Rfl: 0  Review of Systems  Constitutional: Negative.   Respiratory: Negative.   Cardiovascular: Negative.   Musculoskeletal: Negative.   Skin: Positive for rash.    Social History   Tobacco Use  . Smoking status: Never Smoker  . Smokeless tobacco: Never Used  Substance Use Topics  . Alcohol use: No      Objective:   There were no vitals taken for this visit. There were no vitals filed for this visit.   Physical Exam   No results found for any visits on 12/26/18.     Assessment & Plan      I discussed the assessment and treatment plan with the patient. The patient was provided an opportunity to ask questions and all were answered. The patient agreed with the plan and demonstrated an understanding of the instructions.   The patient was advised to call back or seek an in-person evaluation if the symptoms worsen or if the condition fails to improve as anticipated.  1. Dermatitis 2. Pruritus 3. Bedbug bite, subsequent encounter -Ongoing for nearly 1 month - States she has been seen in the emergency department and also by a dermatologist who believed that this  was related to her bedbug bites -As we have done telephone visits, I have not assessed the rash myself, but she describes it in exposed skin areas and is multiple bites in the line which would be consistent with bedbug bites -Patient has had exterminators in her home who believes that they have eradicated the bedbugs, but she continues to have itching and rash -This is exacerbated her anxiety as she worries that this is a reflection of her being unclean -Reassurance given -As topical treatments have failed, will try prednisone taper -Discussed possible side  effects of prednisone -Discussed signs of infection and return precautions    Meds ordered this encounter  Medications  . predniSONE (DELTASONE) 10 MG tablet    Sig: Take 40 mg daily x3 days, then 30mg  daily x3 days, then 20mg  daily x3d, then 10mg  daily x3d, then 5mg  daily x4days    Dispense:  32 tablet    Refill:  0     Return if symptoms worsen or fail to improve.   The entirety of the information documented in the History of Present Illness, Review of Systems and Physical Exam were personally obtained by me. Portions of this information were initially documented by Barbara Chambers, CMA and reviewed by me for thoroughness and accuracy.    Barbara Chambers, Barbara Bucy, MD MPH [facility]

## 2019-01-02 ENCOUNTER — Telehealth: Payer: Self-pay | Admitting: Family Medicine

## 2019-01-02 NOTE — Telephone Encounter (Signed)
Patient schedule appointment for tomorrow @ 2:00 PM.

## 2019-01-02 NOTE — Telephone Encounter (Signed)
Is she able to come in for a visit?  Maybe someone could look at these in person?

## 2019-01-02 NOTE — Telephone Encounter (Signed)
Pt has been being seen for bed bugs.  She said she needs a call or recheck.  She said the places are loooking a little infected  CB#  [phone]  Thanks teri

## 2019-01-03 ENCOUNTER — Encounter: Payer: Self-pay | Admitting: Physician Assistant

## 2019-01-03 ENCOUNTER — Other Ambulatory Visit: Payer: Self-pay

## 2019-01-03 ENCOUNTER — Ambulatory Visit (INDEPENDENT_AMBULATORY_CARE_PROVIDER_SITE_OTHER): Payer: PPO | Admitting: Physician Assistant

## 2019-01-03 VITALS — BP 155/76 | HR 111 | Temp 98.7°F | Resp 16 | Wt 127.0 lb

## 2019-01-03 DIAGNOSIS — B372 Candidiasis of skin and nail: Secondary | ICD-10-CM

## 2019-01-03 MED ORDER — CLOTRIMAZOLE 1 % EX CREA: 1.0000 "application " | TOPICAL_CREAM | Freq: Two times a day (BID) | CUTANEOUS | 0 refills | Status: DC

## 2019-01-03 NOTE — Progress Notes (Signed)
Patient: Barbara Chambers Female    DOB: 02-Mar-1935   85 y.o.   MRN: [id number] Visit Date: 01/03/2019  Today's Provider: Trinna Post, PA-C   Chief Complaint  Patient presents with  . Rash   Subjective:     HPI Patient presents here today c/o recurrent rash under breast. Patient has been seen on multiple occasions for rash in the past month. Patient was seen on 12/06/2018 for virtual visit regarding bedbug bites. Rash was reportedly healing at that time. On 12/26/2018 she was seen again by virtual visit with worsening rash over her entire body. She had tried kenalog cream and nystatin cream. At this point, she reports exterminators had come to her house to address bed bugs. She was started on 16 day prednisone taper.   She presents again for rash. Reports she has a rash under her breast that she is worried represents bed bug bites. She reports that she feels she has bites over her arms and legs, and demonstrates these lesions in clinic today. Feels her bottom itches as well. She reports she was seen by a dermatologist Dr. Brendolyn Patty at [facility] who confirmed she had bed bug bites. Results are not available today.  She reports persistent worry about bed bug infestation. She reports she lives in an 8 room home and has some unused rooms. She reports that she never saw a bed bug in person but that in one of her guest rooms a wing was found on the mattress and the exterminator reported this was likely bed bugs. She reports she had the exterminator heat her house up to 180 degrees farenheit at great cost to her and she was assured that no bed bug could survive that. She reports she continued to feel there were signs of an infestation afterward but that the exterminators became upset with her and did not want to believe her.   She reports that she cleaned a nest off her window prior and that a bed bug has burrowed into her forehead previously and this is perhaps how they start. Reports her  son is very worried about her and wanted to accompany her to today's visit but she did not want this. She reports she is worried people will label her as crazy and not believe her. She wants to know that this rash is not bed bugs and it will go away.   Allergies  Allergen Reactions  . Penicillins Rash     Current Outpatient Medications:  .  COENZYME Q-10 PO, Take by mouth daily. , Disp: , Rfl:  .  hydrochlorothiazide (MICROZIDE) 12.5 MG capsule, Take 12.5 mg by mouth daily., Disp: , Rfl:  .  losartan (COZAAR) 100 MG tablet, Take 100 mg by mouth daily., Disp: , Rfl:  .  nystatin cream (MYCOSTATIN), Apply 1 application topically 2 (two) times daily., Disp: 30 g, Rfl: 0 .  predniSONE (DELTASONE) 10 MG tablet, Take 40 mg daily x3 days, then 30mg  daily x3 days, then 20mg  daily x3d, then 10mg  daily x3d, then 5mg  daily x4days, Disp: 32 tablet, Rfl: 0 .  simvastatin (ZOCOR) 10 MG tablet, Take 1 tablet (10 mg total) by mouth at bedtime., Disp: 90 tablet, Rfl: 3 .  triamcinolone cream (KENALOG) 0.1 %, Apply 1 application topically 2 (two) times daily., Disp: 30 g, Rfl: 0  Review of Systems  Constitutional: Negative.   Cardiovascular: Negative.   Skin: Positive for rash.    Social History  Tobacco Use  . Smoking status: Never Smoker  . Smokeless tobacco: Never Used  Substance Use Topics  . Alcohol use: No      Objective:   BP (!) 155/76 (BP Location: Left Arm, Patient Position: Sitting, Cuff Size: Normal)   Pulse (!) 111   Temp 98.7 F (37.1 C) (Oral)   Resp 16   Wt 127 lb (57.6 kg)   SpO2 97%   BMI 23.23 kg/m  Vitals:   01/03/19 1352  BP: (!) 155/76  Pulse: (!) 111  Resp: 16  Temp: 98.7 F (37.1 C)  TempSrc: Oral  SpO2: 97%  Weight: 127 lb (57.6 kg)     Physical Exam Chest:    Skin:    General: Skin is warm.     Findings: Rash present. Rash is papular.          Comments: Patient points to areas on her legs that are either spider veins or unaffected skin as  places where bedbugs have entered. Points to normal appearing skin and says "He's in there."  Neurological:     Mental Status: She is oriented to person, place, and time.  Psychiatric:        Mood and Affect: Mood is anxious.        Behavior: Behavior is agitated.        Thought Content: Thought content is paranoid and delusional.     Comments: Anxious nearly to the point of tears about the effects bedbugs.       No results found for any visits on 01/03/19.     Assessment & Plan    1. Candida infection of flexural skin  Patient does appear to have candidal infection of flexural area under breast. I will give clotrimazole lotion as below.  I truly do not know whether initial lesions represented bed bug bites or not as I did not personally examine patient. I do realize that bed bug infestation can cause great psychological distress. However, lesions patient points to today are either excoriations from her own vigorous scratching, one mildly scabbed lesion, or no visible lesion. She also reportedly has had her entire home heated to 180F and assured this would kill any infestation.  She relates a very convoluted tale about exterminators not believing her, cleaning nests off her windows, and bugs burrowing into her forehead. She has pointed to normal skin on her legs and commented "He's in there." I have seen patient only a few times in the previous years but I remember her to be quite a pleasant and oriented person. She appeared very paranoid and anxious today and I think this represents a departure from her baseline. Will forward to PCP, she may need to have follow up assessment of mental status and assessment for cognitive impairment.   - clotrimazole (LOTRIMIN) 1 % cream; Apply 1 application topically 2 (two) times daily.  Dispense: 30 g; Refill: 0  The entirety of the information documented in the History of Present Illness, Review of Systems and Physical Exam were personally obtained by  me. Portions of this information were initially documented by Lynford Humphrey, CMA and reviewed by me for thoroughness and accuracy.      Trinna Post, PA-C  [facility]

## 2019-01-05 NOTE — Patient Instructions (Signed)
Intertrigo Intertrigo is skin irritation (inflammation) that happens in warm, moist areas of the body. The irritation can cause a rash and make skin raw and itchy. The rash is usually pink or red. It happens mostly between folds of skin or where skin rubs together, such as:  Between the toes.  In the armpits.  In the groin area.  Under the belly.  Under the breasts.  Around the butt area. This condition is not passed from person to person (is not contagious). What are the causes?  Heat, moisture, rubbing, and not enough air movement.  The condition can be made worse by: ? Sweat. ? Bacteria. ? A fungus, such as yeast. What increases the risk?  Moisture in your skin folds.  You are more likely to develop this condition if you: ? Have diabetes. ? Are overweight. ? Are not able to move around. ? Live in a warm and moist climate. ? Wear splints, braces, or other medical devices. ? Are not able to control your pee (urine) or poop (stool). What are the signs or symptoms?  A pink or red skin rash in the skin fold or near the skin fold.  Raw or scaly skin.  Itching.  A burning feeling.  Bleeding.  Leaking fluid.  A bad smell. How is this treated?  Cleaning and drying your skin.  Taking an antibiotic medicine or using an antibiotic skin cream for a bacterial infection.  Using an antifungal cream on your skin or taking pills for an infection that was caused by a fungus, such as yeast.  Using a steroid ointment to stop the itching and irritation.  Separating the skin fold with a clean cotton cloth to absorb moisture and allow air to flow into the area. Follow these instructions at home:  Keep the affected area clean and dry.  Do not scratch your skin.  Stay cool as much as you can. Use an air conditioner or a fan, if you have one.  Apply over-the-counter and prescription medicines only as told by your doctor.  If you were prescribed an antibiotic medicine,  use it as told by your doctor. Do not stop using the antibiotic even if your condition starts to get better.  Keep all follow-up visits as told by your doctor. This is important. How is this prevented?   Stay at a healthy weight.  Take care of your feet. This is very important if you have diabetes. You should: ? Wear shoes that fit well. ? Keep your feet dry. ? Wear clean cotton or wool socks.  Protect the skin in your groin and butt area as told by your doctor. To do this: ? Follow a regular cleaning routine. ? Use creams, powders, or ointments that protect your skin. ? Change protection pads often.  Do not wear tight clothes. Wear clothes that: ? Are loose. ? Take moisture away from your body. ? Are made of cotton.  Wear a bra that gives good support, if needed.  Shower and dry yourself well after being active. Use a hair dryer on a cool setting to dry between skin folds.  Keep your blood sugar under control if you have diabetes. Contact a doctor if:  Your symptoms do not get better with treatment.  Your symptoms get worse or they spread.  You notice more redness and warmth.  You have a fever. Summary  Intertrigo is skin irritation that occurs when folds of skin rub together.  This condition is caused by heat, moisture,   and rubbing.  This condition may be treated by cleaning and drying your skin and with medicines.  Apply over-the-counter and prescription medicines only as told by your doctor.  Keep all follow-up visits as told by your doctor. This is important. This information is not intended to replace advice given to you by your health care provider. Make sure you discuss any questions you have with your health care provider. Document Released: 07/18/2010 Document Revised: 03/24/2018 Document Reviewed: 03/24/2018 Elsevier Patient Education  2020 [employer]  

## 2019-01-09 ENCOUNTER — Telehealth: Payer: Self-pay

## 2019-01-09 NOTE — Telephone Encounter (Signed)
Mr. Nadara Mustard advised. sd

## 2019-01-09 NOTE — Telephone Encounter (Signed)
Yes I cannot release information without him being on DPR.

## 2019-01-09 NOTE — Telephone Encounter (Signed)
aislynn cifelli wanting to talk to [facility] regarding patient. He did not want to leave any info. Nadara Mustard is not on DPR.

## 2019-01-10 ENCOUNTER — Telehealth: Payer: Self-pay

## 2019-01-10 DIAGNOSIS — R21 Rash and other nonspecific skin eruption: Secondary | ICD-10-CM

## 2019-01-10 MED ORDER — PERMETHRIN 5 % EX CREA: TOPICAL_CREAM | CUTANEOUS | 0 refills | Status: DC

## 2019-01-10 NOTE — Telephone Encounter (Signed)
Patient son [name] called and would like a call back from Fabio Bering ( due to Dr. B been out of the office at the moment) to speak with her about his mothers change in behavior. [name] would not go into details and states he would like to speak with Adriana or Dr. Feliz Beam could be reached at [phone] Please advise.

## 2019-01-10 NOTE — Telephone Encounter (Signed)
Spoke with Julious Oka about his mother's most recent visit and concerns about her mental status. Spoke about how I thought this was a departure for her and how she displayed paranoid thinking in office which could represent a number of conditions including but not limited to depression and dementia. No documented 6CIT since 2017. She had a visit tomorrow with PCP that has been cancelled. I spoke with [name] about how I think the best course of action would be to have her come in and be evaluated with some cognitive function exams and additional history. Reports his mother is fixated on a cream that can be applied at night and rinsed off in the morning that somebody mentioned would kill everything. Told him this sounded like permethrin cream and while I do not think this will help her, it is an overall benign medication that probably would not harm her.   We agreed we can try the cream and use this as an opportunity to state if it does not work she needs to come into the clinic. [name] reports at this point he will come to the clinic and we can reach out to the patient together to schedule an appointment. Forwarding to her PCP whom I think she will need to see in person for further assessment.   May also consider allowing her son to accompany her if he is asymptomatic to ensure compliance with follow up.

## 2019-01-11 ENCOUNTER — Ambulatory Visit: Payer: PPO | Admitting: Family Medicine

## 2019-01-11 NOTE — Telephone Encounter (Signed)
Patient son was advised and states that he is going to try them cream first and then will call back to schedule appointment. FYI

## 2019-01-11 NOTE — Telephone Encounter (Signed)
I agree that patient should be seen in the office and her son should accompany her to provide additional support and information about behavior at home.

## 2019-01-19 ENCOUNTER — Telehealth: Payer: Self-pay | Admitting: Family Medicine

## 2019-01-19 NOTE — Telephone Encounter (Signed)
pt's son called saying he spoke with Evlyn Clines about an appt tomorrow.  He said he had some questions to ask about the appts tomorrow.    [phone]  [name]

## 2019-01-19 NOTE — Telephone Encounter (Signed)
Returned patients son [name]) call and he stated that patient is saying that she has bugs all over her body. He believes that the patient is hallucinating. Patient son would like a call tomorrow after patients appointment if possible.FYI

## 2019-01-20 ENCOUNTER — Other Ambulatory Visit: Payer: Self-pay

## 2019-01-20 ENCOUNTER — Ambulatory Visit (INDEPENDENT_AMBULATORY_CARE_PROVIDER_SITE_OTHER): Payer: PPO | Admitting: Physician Assistant

## 2019-01-20 ENCOUNTER — Encounter: Payer: Self-pay | Admitting: Physician Assistant

## 2019-01-20 ENCOUNTER — Ambulatory Visit: Payer: PPO | Admitting: Family Medicine

## 2019-01-20 VITALS — BP 156/79 | HR 93 | Temp 98.2°F | Wt 129.0 lb

## 2019-01-20 DIAGNOSIS — L299 Pruritus, unspecified: Secondary | ICD-10-CM

## 2019-01-20 DIAGNOSIS — R21 Rash and other nonspecific skin eruption: Secondary | ICD-10-CM | POA: Diagnosis not present

## 2019-01-20 DIAGNOSIS — B86 Scabies: Secondary | ICD-10-CM

## 2019-01-20 MED ORDER — PERMETHRIN 5 % EX CREA: TOPICAL_CREAM | CUTANEOUS | 0 refills | Status: DC

## 2019-01-20 MED ORDER — HYDROXYZINE HCL 10 MG PO TABS: 10.0000 mg | ORAL_TABLET | Freq: Three times a day (TID) | ORAL | 0 refills | Status: DC | PRN

## 2019-01-20 NOTE — Progress Notes (Signed)
Patient: Barbara Chambers Female    DOB: 01-15-1935   84 y.o.   MRN: [id number] Visit Date: 01/20/2019  Today's Provider: Mar Daring, PA-C   Chief Complaint  Patient presents with  . Hallucinations    See phone message 01/19/2019   Subjective:     HPI  Barbara Chambers presents today for f/u visit for continued rash and mental evaluation. Since early June she has reported having bed bugs on her and in her home. She has had multiple visits and underwent multiple topical treatments and one prednisone taper. She has also seen Dr. Nicole Kindred, Dermatology. She reports the exterminator has been to her home 3 times and is coming again today. She does live in a large, 8 bedroom, home by herself. No other family members have exhibited bites. She has mentioned feeling them crawling all over her. She reports excessive itching as well. She reports only feeling like she has seen the bugs twice, most recently yesterday noticed one crawling down her arm. She points to a spot on her shirt today and states "there is one". I picked it up and told her it was just lint from the black sweater she was wearing over a white shirt, and she said "oh ok." She did not argue when I told her it was not a bug and seemed to believe what I said. She does admit to also having "her nerves be tore up" over this. Patient does exhibit some confusion but I feel this is more related to anxiety than delusional behavior.  Allergies  Allergen Reactions  . Penicillins Rash     Current Outpatient Medications:  .  COENZYME Q-10 PO, Take by mouth daily. , Disp: , Rfl:  .  hydrochlorothiazide (MICROZIDE) 12.5 MG capsule, Take 12.5 mg by mouth daily., Disp: , Rfl:  .  losartan (COZAAR) 100 MG tablet, Take 100 mg by mouth daily., Disp: , Rfl:  .  simvastatin (ZOCOR) 10 MG tablet, Take 1 tablet (10 mg total) by mouth at bedtime., Disp: 90 tablet, Rfl: 3 .  clotrimazole (LOTRIMIN) 1 % cream, Apply 1 application topically 2 (two)  times daily. (Patient not taking: Reported on 01/20/2019), Disp: 30 g, Rfl: 0 .  nystatin cream (MYCOSTATIN), Apply 1 application topically 2 (two) times daily. (Patient not taking: Reported on 01/20/2019), Disp: 30 g, Rfl: 0 .  permethrin (ELIMITE) 5 % cream, Apply cream from head to feet, leave on for 8 hours and then wash with soap and water. (Patient not taking: Reported on 01/20/2019), Disp: 60 g, Rfl: 0 .  predniSONE (DELTASONE) 10 MG tablet, Take 40 mg daily x3 days, then 30mg  daily x3 days, then 20mg  daily x3d, then 10mg  daily x3d, then 5mg  daily x4days (Patient not taking: Reported on 01/20/2019), Disp: 32 tablet, Rfl: 0 .  triamcinolone cream (KENALOG) 0.1 %, Apply 1 application topically 2 (two) times daily. (Patient not taking: Reported on 01/20/2019), Disp: 30 g, Rfl: 0  Review of Systems  Constitutional: Negative.   Respiratory: Negative.   Cardiovascular: Negative.  Negative for leg swelling.  Gastrointestinal: Negative.   Musculoskeletal: Positive for myalgias (left leg occasionally). Negative for arthralgias, back pain, gait problem, joint swelling, neck pain and neck stiffness.  Skin: Positive for rash (under her breasts, pt states it has improved some). Negative for color change, pallor and wound.  Neurological: Positive for weakness (Left leg weakness). Negative for dizziness, light-headedness and headaches.  Psychiatric/Behavioral: Positive for hallucinations (Pt's son thinks she  may be hallucinations.  Pt thinks she has bed bugs.).    Social History   Tobacco Use  . Smoking status: Never Smoker  . Smokeless tobacco: Never Used  Substance Use Topics  . Alcohol use: No      Objective:   BP (!) 156/79 (BP Location: Right Arm, Patient Position: Sitting, Cuff Size: Normal)   Pulse 93   Temp 98.2 F (36.8 C) (Oral)   Wt 129 lb (58.5 kg)   SpO2 98%   BMI 23.59 kg/m  Vitals:   01/20/19 1124  BP: (!) 156/79  Pulse: 93  Temp: 98.2 F (36.8 C)  TempSrc: Oral  SpO2:  98%  Weight: 129 lb (58.5 kg)     Physical Exam Vitals signs reviewed.  Constitutional:      General: She is not in acute distress.    Appearance: Normal appearance. She is well-developed and normal weight. She is not ill-appearing or diaphoretic.  Neck:     Musculoskeletal: Normal range of motion and neck supple.     Thyroid: No thyromegaly.     Vascular: No JVD.     Trachea: No tracheal deviation.  Cardiovascular:     Rate and Rhythm: Normal rate and regular rhythm.     Heart sounds: Normal heart sounds. No murmur. No friction rub. No gallop.   Pulmonary:     Effort: Pulmonary effort is normal. No respiratory distress.     Breath sounds: Normal breath sounds. No wheezing or rales.  Lymphadenopathy:     Cervical: No cervical adenopathy.  Skin:    Findings: Rash (diffuse erythematous lesions c/w bug bites noted all over stomach, back, legs, buttocks, and right arm; there are some lesions on the stomach that have burrowing tracks around them) present.  Neurological:     General: No focal deficit present.     Mental Status: She is alert and oriented to person, place, and time. Mental status is at baseline.   Cognitive Testing - 6-CIT  Correct? Score   What year is it? yes 0 0 or 4  What month is it? yes 0 0 or 3  Memorize:    Pia Mau,  44,  [place],      What time is it? (within 1 hour) yes 0 0 or 3  Count backwards from 20 yes 0 0, 2, or 4  Name the months of the year yes 0 0, 2, or 4  Repeat name & address above yes 0 0, 2, 4, 6, 8, or 10       TOTAL SCORE  0/28   Interpretation:  Normal  Normal (0-7) Abnormal (8-28)    No results found for any visits on 01/20/19.     Assessment & Plan    1. Rash Suspect scabies infestation instead of bed bugs. Would guess since fabrics and pillows have not been washed or disinfected appropriately that she has continued to re-infect herself. No one else lives in the home thus no one else with infestation currently.  Advised of washing all fabrics in hot water that are capable of this. Bagging any others in plastic bags for 7-10 days. Son is going to discuss with exterminator if there is anything that can be sprayed on the fabrics/couches/etc. Permethrin given as below. May repeat in 7 days if needed. Hydroxyzine sent in for itching. Call if not improving or if she continues to see bugs. May require Seroquel to help calm down.  - permethrin (ELIMITE) 5 %  cream; Apply cream from head to feet, leave on for 8 hours and then wash with soap and water; repeat in 7 days if needed  Dispense: 60 g; Refill: 0  2. Scabies See above medical treatment plan. - permethrin (ELIMITE) 5 % cream; Apply cream from head to feet, leave on for 8 hours and then wash with soap and water; repeat in 7 days if needed  Dispense: 60 g; Refill: 0 - hydrOXYzine (ATARAX/VISTARIL) 10 MG tablet; Take 1 tablet (10 mg total) by mouth 3 (three) times daily as needed.  Dispense: 30 tablet; Refill: 0  3. Itching See above medical treatment plan. - permethrin (ELIMITE) 5 % cream; Apply cream from head to feet, leave on for 8 hours and then wash with soap and water; repeat in 7 days if needed  Dispense: 60 g; Refill: 0 - hydrOXYzine (ATARAX/VISTARIL) 10 MG tablet; Take 1 tablet (10 mg total) by mouth 3 (three) times daily as needed.  Dispense: 30 tablet; Refill: 0  I spent approximately 40 minutes with the patient today. Over 50% of this time was spent with counseling and educating the patient.     Mar Daring, PA-C  [facility]

## 2019-01-20 NOTE — Patient Instructions (Signed)
Scabies, Adult  Scabies is a skin condition that happens when very small insects get under the skin (infestation). This causes a rash and severe itchiness. Scabies can spread from person to person (is contagious). If you get scabies, it is common for others in your household to get scabies too. With proper treatment, symptoms usually go away in 2-4 weeks. Scabies usually does not cause lasting problems. What are the causes? This condition is caused by tiny mites (Sarcoptes scabiei, or human itch mites) that can only be seen with a microscope. The mites get into the top layer of skin and lay eggs. Scabies can spread from person to person through:  Close contact with a person who has scabies.  Sharing or having contact with infested items, such as towels, bedding, or clothing. What increases the risk? The following factors may make you more likely to develop this condition:  Living in a nursing home or other extended care facility.  Having sexual contact with a partner who has scabies.  Caring for others who are at increased risk for scabies. What are the signs or symptoms? Symptoms of this condition include:  Severe itchiness. This is often worse at night.  A rash that includes tiny red bumps or blisters. The rash commonly occurs on the hands, wrists, elbows, armpits, chest, waist, groin, or buttocks. The bumps may form a line (burrow) in some areas.  Skin irritation. This can include scaly patches or sores. How is this diagnosed? This condition may be diagnosed based on:  A physical exam of the skin.  A skin test. Your health care provider may take a sample of your affected skin (skin scraping) and have it examined under a microscope for signs of mites. How is this treated? This condition may be treated with:  Medicated cream or lotion that kills the mites. This is spread on the entire body and left on for several hours. Usually, one treatment with medicated cream or lotion is  enough to kill all the mites. In severe cases, the treatment may need to be repeated.  Medicated cream that relieves itching.  Medicines taken by mouth (orally) that: ? Relieve itching. ? Reduce the swelling and redness. ? Kill the mites. This treatment may be done in severe cases. Follow these instructions at home: Medicines   Take or apply over-the-counter and prescription medicines as told by your health care provider.  Apply medicated cream or lotion as told by your health care provider.  Do not wash off the medicated cream or lotion until the necessary amount of time has passed. Skin care   Avoid scratching the affected areas of your skin.  Keep your fingernails closely trimmed to reduce injury from scratching.  Take cool baths or apply cool washcloths to your skin to help reduce itching. General instructions  Clean all items that you recently had contact with, including bedding, clothing, and furniture. Do this on the same day that you start treatment. ? Dry clean items, or use hot water to wash items. Dry items on the hot dry cycle. ? Place items that cannot be washed into closed, airtight plastic bags for at least 3 days. The mites cannot live for more than 3 days away from human skin. ? Vacuum furniture and mattresses that you use.  Make sure that other people who may have been infested are examined by a health care provider. These include members of your household and anyone who may have had contact with infested items.  Keep all follow-up  visits as told by your health care provider. This is important. Contact a health care provider if:  You have itching that does not go away after 4 weeks of treatment.  You continue to develop new bumps or burrows.  You have redness, swelling, or pain in your rash area after treatment.  You have fluid, blood, or pus coming from your rash. Summary  Scabies is a skin condition that causes a rash and severe itchiness.  This  condition is caused by tiny mites that get into the top layer of the skin and lay eggs.  Scabies can spread from person to person.  Follow treatments as recommended by your health care provider.  Clean all items that you recently had contact with. This information is not intended to replace advice given to you by your health care provider. Make sure you discuss any questions you have with your health care provider. Document Released: 03/06/2015 Document Revised: 04/20/2018 Document Reviewed: 04/20/2018 Elsevier Patient Education  2020 [employer].

## 2019-01-20 NOTE — Telephone Encounter (Signed)
Brought son [name]) into room and discussed treatment and f/u

## 2019-01-27 ENCOUNTER — Telehealth: Payer: Self-pay | Admitting: Family Medicine

## 2019-01-27 NOTE — Telephone Encounter (Signed)
Pt's son called saying he and Truddie Crumble has had several conversations regarding his mom and he would like Portia to call him back.  CB#  [phone]  [name]

## 2019-01-30 NOTE — Telephone Encounter (Signed)
Called patient's son [name]) back and he states since patient visit 01/20/2019 with Tawanna Sat patient has use all of the cream that was prescribed and still believes that she has bugs all over her body. Son states that patient called him and said [place] and he schedule appointment to see Dr. Jacinto Reap on 02/02/2019. FYI

## 2019-02-02 ENCOUNTER — Other Ambulatory Visit: Payer: Self-pay

## 2019-02-02 ENCOUNTER — Encounter: Payer: Self-pay | Admitting: Family Medicine

## 2019-02-02 ENCOUNTER — Ambulatory Visit (INDEPENDENT_AMBULATORY_CARE_PROVIDER_SITE_OTHER): Payer: PPO | Admitting: Family Medicine

## 2019-02-02 VITALS — BP 160/82 | HR 106 | Temp 98.1°F | Wt 127.2 lb

## 2019-02-02 DIAGNOSIS — F4323 Adjustment disorder with mixed anxiety and depressed mood: Secondary | ICD-10-CM

## 2019-02-02 DIAGNOSIS — I10 Essential (primary) hypertension: Secondary | ICD-10-CM | POA: Diagnosis not present

## 2019-02-02 DIAGNOSIS — R21 Rash and other nonspecific skin eruption: Secondary | ICD-10-CM

## 2019-02-02 MED ORDER — NYSTATIN 100000 UNIT/GM EX OINT: 1.0000 "application " | TOPICAL_OINTMENT | Freq: Two times a day (BID) | CUTANEOUS | 0 refills | Status: DC

## 2019-02-02 MED ORDER — SERTRALINE HCL 50 MG PO TABS: 50.0000 mg | ORAL_TABLET | Freq: Every day | ORAL | 3 refills | Status: DC

## 2019-02-02 MED ORDER — HYDROCHLOROTHIAZIDE 25 MG PO TABS: 25.0000 mg | ORAL_TABLET | Freq: Every day | ORAL | 1 refills | Status: DC

## 2019-02-02 MED ORDER — PREDNISONE 20 MG PO TABS: 20.0000 mg | ORAL_TABLET | Freq: Every day | ORAL | 0 refills | Status: AC

## 2019-02-02 MED ORDER — NYSTATIN-TRIAMCINOLONE 100000-0.1 UNIT/GM-% EX OINT: 1.0000 "application " | TOPICAL_OINTMENT | Freq: Two times a day (BID) | CUTANEOUS | 0 refills | Status: DC

## 2019-02-02 NOTE — Progress Notes (Signed)
Patient: Barbara Chambers Female    DOB: 1935/05/06   84 y.o.   MRN: [id number] Visit Date: 02/02/2019  Today's Provider: Lavon Paganini, MD   No chief complaint on file.  Subjective:     HPI Follow up: Patient was seen on 12/26/2018 for suspected bed bug bites after being seen by a dermatologist and in the emergency department and diagnosed with this and started Prednisone taper -of note, this was a phone visit and the rash was not visualized at that time Patient was seen again on 01/03/2019 and started Clotrimazole 1% BID for candidal intertrigo underneath both breasts Patient was also seen on 01/20/2019 and started Hydroxyzine 10 mg BID PRN, and Permethrin 5% for suspected scabies  Patient is here today with her son, Barbara Chambers.  She lives alone.  For the last 6 weeks or so, she has become very concerned and obsessive about cleaning after having a bedbug infestation.  She has had 3 different exterminators at her house to check for bedbugs.  She has been told that they have been eradicated.  After her last visit on 7/24, when she was treated for scabies, she wash all of her linens and bagged up things for 3 days that she could not wash.  She worries about bugs having burrowed in her skin and coming back out.  She states that she still has a rash under both breasts and some bite marks on her legs and under her breasts.  She states that it is that of all of the treatments, the prednisone taper is helped the most  She is also noticed that her blood pressures running high during her recent visits and this was not a problem for her previously.  She is taking her medication with good compliance.  She does admit to feeling down and depressed about her current situation.  She worries frequently about the cleanliness of her home.  She never thought that something like this would happen.  She denies any SI, AVH.  Her son is concerned about her mental state.  He believes that she has been depressed for many  years but refuses to admit it.  He states that she is very obsessive and anxious about her home since the bedbug infestation.  He has been to her home has not seen any bugs present.  He states that she does go and work in the yard and get some bug bites around her ankles.  She has told him repeatedly that there are bugs living underneath her skin and he is unable to see any evidence of this.  He and other family members have tried to reassure the patient that there are no bugs anymore, but she is hard to redirect  Allergies  Allergen Reactions  . Penicillins Rash     Current Outpatient Medications:  .  COENZYME Q-10 PO, Take by mouth daily. , Disp: , Rfl:  .  hydrochlorothiazide (MICROZIDE) 12.5 MG capsule, Take 12.5 mg by mouth daily., Disp: , Rfl:  .  hydrOXYzine (ATARAX/VISTARIL) 10 MG tablet, Take 1 tablet (10 mg total) by mouth 3 (three) times daily as needed., Disp: 30 tablet, Rfl: 0 .  losartan (COZAAR) 100 MG tablet, Take 100 mg by mouth daily., Disp: , Rfl:  .  permethrin (ELIMITE) 5 % cream, Apply cream from head to feet, leave on for 8 hours and then wash with soap and water; repeat in 7 days if needed, Disp: 60 g, Rfl: 0 .  simvastatin (ZOCOR) 10 MG tablet, Take 1 tablet (10 mg total) by mouth at bedtime., Disp: 90 tablet, Rfl: 3  Review of Systems  Constitutional: Negative.   Respiratory: Negative.   Cardiovascular: Negative.   Musculoskeletal: Negative.     Social History   Tobacco Use  . Smoking status: Never Smoker  . Smokeless tobacco: Never Used  Substance Use Topics  . Alcohol use: No      Objective:   There were no vitals taken for this visit. There were no vitals filed for this visit.   Physical Exam Vitals signs reviewed.  Constitutional:      General: She is not in acute distress.    Appearance: Normal appearance.  HENT:     Head: Normocephalic and atraumatic.  Eyes:     General: No scleral icterus.    Conjunctiva/sclera: Conjunctivae normal.   Cardiovascular:     Rate and Rhythm: Normal rate and regular rhythm.     Heart sounds: No murmur.  Pulmonary:     Effort: Pulmonary effort is normal. No respiratory distress.     Breath sounds: Normal breath sounds. No wheezing or rhonchi.  Musculoskeletal:     Right lower leg: Edema (trace, non-pitting) present.     Left lower leg: Edema (trace, non-pitting) present.  Skin:    General: Skin is warm and dry.     Capillary Refill: Capillary refill takes less than 2 seconds.     Comments: Intertrigo below both breasts.  Excoriations diffusely over her legs and back.  Few small erythematous papules that appear to be healing bedbug bites.  No evidence of scabies.  She does have varicose veins of her legs that she is worried are bugs underneath her skin  Neurological:     Mental Status: She is alert and oriented to person, place, and time.  Psychiatric:        Attention and Perception: She does not perceive auditory or visual hallucinations.        Mood and Affect: Mood is anxious and depressed. Affect is tearful.        Speech: Speech normal.        Behavior: Behavior is not agitated or aggressive.        Thought Content: Thought content does not include homicidal or suicidal ideation. Thought content does not include homicidal or suicidal plan.      No results found for any visits on 02/02/19.     Assessment & Plan   1. Benign essential HTN -Chronic, uncontrolled -I suspect that her anxiety and agitation about recent events contribute to this -She does have some trace lower extremity edema as well -We will temporarily increase her HCTZ to 25 mg daily, though I suspect as her anxiety improves (see plan below) that this will improve as well and will be able to decrease the dose again -Patient is asymptomatic, but discussed return precautions  2. Rash -Chronic and ongoing problem - Patient previously with bedbug bites which seem to be healing -She has no evidence of scabies, for  which she was treated multiple times - She does have candidal intertrigo underneath her breasts which we will treat with nystatin triamcinolone cream - Given that she had such improvement with prednisone in the past, it is reasonable to give a low dose daily for 1 week to see if we can improve her itching and let the bites heal fully -Reassured patient many times over that there are no bugs in her skin and she does  not seem to have any new bug bites suggesting that there still bugs living in her home  3. Adjustment disorder with mixed anxiety and depressed mood -New problem -She has been struggling a bit with some anxiety and depression for the last year since her husband passed away and she is living alone for the first time in her life -She was previously unwilling to admit that she had some anxiety and depression and unwilling to treat this - Since the bedbug infestation, her anxiety and obsession with cleaning have worsened significantly -She is tearful today and show signs of depression at home - We discussed starting a low-dose Zoloft to help with coping with the stressors in her life and patient agrees -Will Start Zoloft 50 mg daily - Discussed potential side effects, incl GI upset, sexual dysfunction, increased anxiety, and SI - Discussed that it can take 6-8 weeks to reach full efficacy - Contracted for safety - no SI/HI -Patient was tested for cognitive decline at last visit and she showed no signs of labs -She does not show any signs of psychosis or hallucinations -Follow-up in 1 month    Meds ordered this encounter  Medications  . hydrochlorothiazide (HYDRODIURIL) 25 MG tablet    Sig: Take 1 tablet (25 mg total) by mouth daily.    Dispense:  30 tablet    Refill:  1  . predniSONE (DELTASONE) 20 MG tablet    Sig: Take 1 tablet (20 mg total) by mouth daily with breakfast for 7 days.    Dispense:  7 tablet    Refill:  0  . nystatin-triamcinolone ointment (MYCOLOG)    Sig:  Apply 1 application topically 2 (two) times daily.    Dispense:  30 g    Refill:  0  . sertraline (ZOLOFT) 50 MG tablet    Sig: Take 1 tablet (50 mg total) by mouth daily.    Dispense:  30 tablet    Refill:  3     Return in about 4 weeks (around 03/02/2019) for MDD/GAD f/u.   The entirety of the information documented in the History of Present Illness, Review of Systems and Physical Exam were personally obtained by me. Portions of this information were initially documented by [name], CMA and reviewed by me for thoroughness and accuracy.    [name], [name], MD MPH [facility]

## 2019-02-02 NOTE — Addendum Note (Signed)
Addended by: [name] on: 02/02/2019 04:41 PM   Modules accepted: Orders

## 2019-02-10 ENCOUNTER — Telehealth: Payer: Self-pay | Admitting: Family Medicine

## 2019-02-10 NOTE — Telephone Encounter (Signed)
Recommend rechecking.  Make sure patient is calm.  Her BP meds were increased and should not cause increase in BP.  If she develops any change in mental status, chest pain, SOB, or other symptoms, may need to go to ED.

## 2019-02-10 NOTE — Telephone Encounter (Signed)
[name] [name] advised.

## 2019-02-10 NOTE — Telephone Encounter (Signed)
[name] called stating Barbara Chambers's BP medication was changed last week.  Yesterday, Barbara Chambers's BP was up to 202/98. Please advise.

## 2019-02-15 ENCOUNTER — Telehealth: Payer: Self-pay | Admitting: Family Medicine

## 2019-02-15 MED ORDER — SERTRALINE HCL 50 MG PO TABS: 100.0000 mg | ORAL_TABLET | Freq: Every day | ORAL | 3 refills | Status: DC

## 2019-02-15 MED ORDER — HYDROCHLOROTHIAZIDE 25 MG PO TABS: 25.0000 mg | ORAL_TABLET | Freq: Every day | ORAL | 1 refills | Status: DC

## 2019-02-15 NOTE — Telephone Encounter (Signed)
Called back and spoke to Ransom.  Patient started taking Zoloft after our last visit ~2 wks ago.  Seems to be getting worse. Yesterday was the worst day yet.  He feels like she is seeing things that aren't there.  She was very frantic and frazzled yesterday and forgot to take her medicaitons.  She accidentally threw away HCTZ instead of hydroxyzine recently.    Will resume HCTZ and increase Zoloft ot 100mg  daily. May need referral to psych in the future, but know this would be a tough sell.

## 2019-02-15 NOTE — Telephone Encounter (Signed)
[name] - son of pt [phone]  Pt is still seeing tings -irrational-  bp still high - confusion. Not doing any better from the visit and taking the medication prescribed.  Please call [name] back to discuss.  Thanks, [employer]

## 2019-02-17 ENCOUNTER — Telehealth: Payer: Self-pay | Admitting: Family Medicine

## 2019-02-17 MED ORDER — NYSTATIN 100000 UNIT/GM EX OINT: 1.0000 "application " | TOPICAL_OINTMENT | Freq: Two times a day (BID) | CUTANEOUS | 0 refills | Status: DC

## 2019-02-17 NOTE — Telephone Encounter (Signed)
pt's son called wanting a refill on  Mycostatin ointment  [facility]  CB#  [phone]  [name]

## 2019-02-24 ENCOUNTER — Telehealth: Payer: Self-pay

## 2019-02-28 NOTE — Progress Notes (Signed)
Patient: Barbara Chambers Female    DOB: 01/05/1935   84 y.o.   MRN: [id number] Visit Date: 03/01/2019  Today's Provider: Lavon Paganini, MD   Chief Complaint  Patient presents with  . Depression   Subjective:    HPI  Anxiety & Depression Patient presents today for anxiety & depression follow-up. Patient last office visit was 02/02/2019 and patient was advised to started Zoloft 50 MG daily. [name] called back on 02/15/2019 and states that she had gotten worse with her anxiety and medication was increased to 100 MG daily.  She states that she has good days and bad days. She had a day last week when she sent her sister away as she was afraid of her getting bugs on her.  She continues to perseverate on having bugs under her skin.  She is taking medications with good compliance with help from her son.  Son, Barbara Chambers, is present with the patient today. He provides additional details.  He admits that the problem is larger than what we have seen.  She seems to have times where she does not remember.  She has cleaned so much that she has run through 2 vacuums and almost wore out her dryer.  She continues to states that she sees bugs around her house despite being told that there are none.   Allergies  Allergen Reactions  . Penicillins Rash     Current Outpatient Medications:  .  COENZYME Q-10 PO, Take by mouth daily. , Disp: , Rfl:  .  hydrochlorothiazide (HYDRODIURIL) 25 MG tablet, Take 1 tablet (25 mg total) by mouth daily., Disp: 30 tablet, Rfl: 1 .  losartan (COZAAR) 100 MG tablet, Take 100 mg by mouth daily., Disp: , Rfl:  .  nystatin ointment (MYCOSTATIN), Apply 1 application topically 2 (two) times daily., Disp: 30 g, Rfl: 0 .  sertraline (ZOLOFT) 50 MG tablet, Take 2 tablets (100 mg total) by mouth daily., Disp: 30 tablet, Rfl: 3 .  simvastatin (ZOCOR) 10 MG tablet, Take 1 tablet (10 mg total) by mouth at bedtime., Disp: 90 tablet, Rfl: 3  Review of Systems   Constitutional: Negative.   Respiratory: Negative.   Neurological: Negative.   Psychiatric/Behavioral: The patient is nervous/anxious.     Social History   Tobacco Use  . Smoking status: Never Smoker  . Smokeless tobacco: Never Used  Substance Use Topics  . Alcohol use: No      Objective:   BP 134/72 (BP Location: Left Arm, Patient Position: Sitting, Cuff Size: Normal)   Pulse 96   Temp (!) 96.6 F (35.9 C) (Temporal)   Resp 16   Wt 124 lb (56.2 kg)   SpO2 97%   BMI 22.68 kg/m  Vitals:   03/01/19 0947  BP: 134/72  Pulse: 96  Resp: 16  Temp: (!) 96.6 F (35.9 C)  TempSrc: Temporal  SpO2: 97%  Weight: 124 lb (56.2 kg)  Body mass index is 22.68 kg/m.   Physical Exam Vitals signs reviewed.  Constitutional:      General: She is not in acute distress.    Appearance: Normal appearance. She is well-developed. She is not diaphoretic.  HENT:     Head: Normocephalic and atraumatic.  Eyes:     General: No scleral icterus.    Conjunctiva/sclera: Conjunctivae normal.  Neck:     Musculoskeletal: Neck supple.     Thyroid: No thyromegaly.  Cardiovascular:     Rate and Rhythm: Normal rate  and regular rhythm.     Pulses: Normal pulses.     Heart sounds: Normal heart sounds. No murmur.  Pulmonary:     Effort: Pulmonary effort is normal. No respiratory distress.     Breath sounds: Normal breath sounds. No wheezing, rhonchi or rales.  Musculoskeletal:     Right lower leg: No edema.     Left lower leg: No edema.  Lymphadenopathy:     Cervical: No cervical adenopathy.  Skin:    General: Skin is warm and dry.     Capillary Refill: Capillary refill takes less than 2 seconds.     Findings: No rash.  Neurological:     Mental Status: She is alert and oriented to person, place, and time.     Motor: Motor function is intact.     Gait: Gait is intact.  Psychiatric:        Mood and Affect: Mood is anxious. Affect is tearful.        Behavior: Behavior is agitated (becomes  agitated when told that there are no bugs burrowed in her). Behavior is cooperative.        Thought Content: Thought content does not include homicidal or suicidal ideation.      No results found for any visits on 03/01/19.     Assessment & Plan   Problem List Items Addressed This Visit      Cardiovascular and Mediastinum   Benign essential HTN    Occasionally increasing at home Well controlled currently Continue current medications - take regularly with help from son Discussed how stress and anxiety can contribute to rises in BP Recheck metabolic panel        Other   Adjustment disorder with mixed anxiety and depressed mood - Primary    Uncontrolled Will increase Zoloft to 150mg  daily Referral to Psych      Relevant Orders   Ambulatory referral to Psychiatry   TSH   Comprehensive metabolic panel   CBC w/Diff/Platelet   Referral to Chronic Care Management Services   Obsessional thoughts    Concern about patient's perseveration on cleanliness and bugs being in her home Will continue Zoloft Needs psych eval      Relevant Orders   Ambulatory referral to Psychiatry   TSH   Comprehensive metabolic panel   CBC w/Diff/Platelet   Referral to Chronic Care Management Services   Ambulatory referral to Neurology   Delusional disorder [place])    Patient continues to have delusions about bugs being in her home and under her skin Tried reassurance that multiple exterminators have found no bugs in her home and she has no rash or evidence of bug bits Concerns for delusional disorder Will need to have psych eval Will obtain some labwork to reassure patient      Relevant Orders   TSH   Comprehensive metabolic panel   CBC w/Diff/Platelet   Referral to Chronic Care Management Services   Ambulatory referral to Neurology   Cognitive impairment    Patient previously evaluated with MMSE which was normal, but she continues to have some episodes of forgetfulness Wonder if  cognitive impairment may be playing a role in her delusions or if she may have pseudodementia Will refer to Psych and Neuro May need MRI      Relevant Orders   Referral to Chronic Care Management Services   Ambulatory referral to Neurology       Return in about 4 weeks (around 03/29/2019).   The entirety of the  information documented in the History of Present Illness, Review of Systems and Physical Exam were personally obtained by me. Portions of this information were initially documented by [facility], CMA and reviewed by me for thoroughness and accuracy.    Mackinze Criado, [name], MD MPH [facility]

## 2019-03-01 ENCOUNTER — Encounter: Payer: Self-pay | Admitting: Family Medicine

## 2019-03-01 ENCOUNTER — Ambulatory Visit (INDEPENDENT_AMBULATORY_CARE_PROVIDER_SITE_OTHER): Payer: PPO | Admitting: Family Medicine

## 2019-03-01 ENCOUNTER — Other Ambulatory Visit: Payer: Self-pay

## 2019-03-01 VITALS — BP 134/72 | HR 96 | Temp 96.6°F | Resp 16 | Wt 124.0 lb

## 2019-03-01 DIAGNOSIS — F428 Other obsessive-compulsive disorder: Secondary | ICD-10-CM | POA: Insufficient documentation

## 2019-03-01 DIAGNOSIS — F4323 Adjustment disorder with mixed anxiety and depressed mood: Secondary | ICD-10-CM

## 2019-03-01 DIAGNOSIS — F22 Delusional disorders: Secondary | ICD-10-CM

## 2019-03-01 DIAGNOSIS — R443 Hallucinations, unspecified: Secondary | ICD-10-CM | POA: Insufficient documentation

## 2019-03-01 DIAGNOSIS — I10 Essential (primary) hypertension: Secondary | ICD-10-CM

## 2019-03-01 DIAGNOSIS — F03B0 Unspecified dementia, moderate, without behavioral disturbance, psychotic disturbance, mood disturbance, and anxiety: Secondary | ICD-10-CM | POA: Insufficient documentation

## 2019-03-01 DIAGNOSIS — R4189 Other symptoms and signs involving cognitive functions and awareness: Secondary | ICD-10-CM | POA: Diagnosis not present

## 2019-03-01 MED ORDER — SERTRALINE HCL 50 MG PO TABS: 150.0000 mg | ORAL_TABLET | Freq: Every day | ORAL | 3 refills | Status: DC

## 2019-03-01 NOTE — Assessment & Plan Note (Signed)
Uncontrolled Will increase Zoloft to 150mg  daily Referral to Psych

## 2019-03-01 NOTE — Assessment & Plan Note (Signed)
Occasionally increasing at home Well controlled currently Continue current medications - take regularly with help from son Discussed how stress and anxiety can contribute to rises in BP Recheck metabolic panel

## 2019-03-01 NOTE — Assessment & Plan Note (Addendum)
Patient continues to have delusions about bugs being in her home and under her skin Tried reassurance that multiple exterminators have found no bugs in her home and she has no rash or evidence of bug bits Concerns for delusional disorder Will need to have psych eval Will obtain some labwork to reassure patient

## 2019-03-01 NOTE — Assessment & Plan Note (Signed)
Concern about patient's perseveration on cleanliness and bugs being in her home Will continue Zoloft Needs psych eval

## 2019-03-01 NOTE — Assessment & Plan Note (Signed)
Patient previously evaluated with MMSE which was normal, but she continues to have some episodes of forgetfulness Wonder if cognitive impairment may be playing a role in her delusions or if she may have pseudodementia Will refer to Psych and Neuro May need MRI

## 2019-03-02 ENCOUNTER — Telehealth: Payer: Self-pay

## 2019-03-02 DIAGNOSIS — E871 Hypo-osmolality and hyponatremia: Secondary | ICD-10-CM

## 2019-03-02 LAB — COMPREHENSIVE METABOLIC PANEL
ALT: 18 IU/L (ref 0–32)
AST: 19 IU/L (ref 0–40)
Albumin/Globulin Ratio: 2.4 — ABNORMAL HIGH (ref 1.2–2.2)
Albumin: 4 g/dL (ref 3.6–4.6)
Alkaline Phosphatase: 79 IU/L (ref 39–117)
BUN/Creatinine Ratio: 15 (ref 12–28)
BUN: 8 mg/dL (ref 8–27)
Bilirubin Total: 0.6 mg/dL (ref 0.0–1.2)
CO2: 25 mmol/L (ref 20–29)
Calcium: 9.1 mg/dL (ref 8.7–10.3)
Chloride: 88 mmol/L — ABNORMAL LOW (ref 96–106)
Creatinine, Ser: 0.53 mg/dL — ABNORMAL LOW (ref 0.57–1.00)
GFR calc Af Amer: 102 mL/min/{1.73_m2} (ref >59)
GFR calc non Af Amer: 89 mL/min/{1.73_m2} (ref >59)
Globulin, Total: 1.7 g/dL (ref 1.5–4.5)
Glucose: 98 mg/dL (ref 65–99)
Potassium: 3 mmol/L — ABNORMAL LOW (ref 3.5–5.2)
Sodium: 127 mmol/L — ABNORMAL LOW (ref 134–144)
Total Protein: 5.7 g/dL — ABNORMAL LOW (ref 6.0–8.5)

## 2019-03-02 LAB — CBC WITH DIFFERENTIAL/PLATELET
Basophils Absolute: 0 10*3/uL (ref 0.0–0.2)
Basos: 0 %
EOS (ABSOLUTE): 0 10*3/uL (ref 0.0–0.4)
Eos: 0 %
Hematocrit: 35.9 % (ref 34.0–46.6)
Hemoglobin: 11.9 g/dL (ref 11.1–15.9)
Immature Grans (Abs): 0 10*3/uL (ref 0.0–0.1)
Immature Granulocytes: 0 %
Lymphocytes Absolute: 17.1 10*3/uL — ABNORMAL HIGH (ref 0.7–3.1)
Lymphs: 73 %
MCH: 27.5 pg (ref 26.6–33.0)
MCHC: 33.1 g/dL (ref 31.5–35.7)
MCV: 83 fL (ref 79–97)
Monocytes Absolute: 0.5 10*3/uL (ref 0.1–0.9)
Monocytes: 2 %
Neutrophils Absolute: 6.1 10*3/uL (ref 1.4–7.0)
Neutrophils: 25 %
Platelets: 268 10*3/uL (ref 150–450)
RBC: 4.32 x10E6/uL (ref 3.77–5.28)
RDW: 13.4 % (ref 11.7–15.4)
WBC: 23.8 10*3/uL (ref 3.4–10.8)

## 2019-03-02 LAB — TSH: TSH: 3.79 u[IU]/mL (ref 0.450–4.500)

## 2019-03-02 NOTE — Telephone Encounter (Signed)
[name]) was advised.

## 2019-03-02 NOTE — Telephone Encounter (Signed)
Patient and [name]) was advised. Patient states that she feels a lot better now. [name] wants to know if the low sodium levels could have something to do with her behaviors and if she is doing better since you changed her Zoloft how is it going to effect her with tapering off of it? Please advise.

## 2019-03-02 NOTE — Telephone Encounter (Signed)
-----   Message from [name], MD sent at 03/02/2019 10:42 AM EDT ----- Normal Thyroid function.  Stable Blood counts.  Sodium has decreased over the last 3 months.,  Worry that this could be a medication side effect from the Zoloft (SIADH).  Would like to taper it down. Decrease to 1 tab daily for 1 week and then 1 tab every other day for 1 week and then stop.  Recheck BMP in 2 weeks.  If becomes more confused, may need to go to the ER.

## 2019-03-02 NOTE — Telephone Encounter (Signed)
The change in behavior could be related to the low sodium levels, so I hope that her memory will actually improve with coming down on the Zoloft.  I understand the hesitation to do so, but it is important to start correcting this sodium level.

## 2019-03-03 ENCOUNTER — Other Ambulatory Visit: Payer: Self-pay

## 2019-03-03 ENCOUNTER — Inpatient Hospital Stay
Admission: EM | Admit: 2019-03-03 | Discharge: 2019-03-05 | DRG: 690 | Disposition: A | Discharge status: Home Health | Payer: PPO | Attending: Internal Medicine | Admitting: Internal Medicine

## 2019-03-03 ENCOUNTER — Emergency Department: Payer: PPO

## 2019-03-03 ENCOUNTER — Telehealth: Payer: Self-pay | Admitting: Family Medicine

## 2019-03-03 DIAGNOSIS — C9110 Chronic lymphocytic leukemia of B-cell type not having achieved remission: Secondary | ICD-10-CM | POA: Diagnosis not present

## 2019-03-03 DIAGNOSIS — Z8249 Family history of ischemic heart disease and other diseases of the circulatory system: Secondary | ICD-10-CM

## 2019-03-03 DIAGNOSIS — E876 Hypokalemia: Secondary | ICD-10-CM | POA: Diagnosis not present

## 2019-03-03 DIAGNOSIS — F419 Anxiety disorder, unspecified: Secondary | ICD-10-CM | POA: Diagnosis not present

## 2019-03-03 DIAGNOSIS — R21 Rash and other nonspecific skin eruption: Secondary | ICD-10-CM | POA: Diagnosis not present

## 2019-03-03 DIAGNOSIS — N3000 Acute cystitis without hematuria: Principal | ICD-10-CM | POA: Diagnosis present

## 2019-03-03 DIAGNOSIS — I10 Essential (primary) hypertension: Secondary | ICD-10-CM | POA: Diagnosis not present

## 2019-03-03 DIAGNOSIS — F0390 Unspecified dementia without behavioral disturbance: Secondary | ICD-10-CM | POA: Diagnosis not present

## 2019-03-03 DIAGNOSIS — R531 Weakness: Secondary | ICD-10-CM | POA: Diagnosis not present

## 2019-03-03 DIAGNOSIS — Z79899 Other long term (current) drug therapy: Secondary | ICD-10-CM | POA: Diagnosis not present

## 2019-03-03 DIAGNOSIS — E871 Hypo-osmolality and hyponatremia: Secondary | ICD-10-CM | POA: Diagnosis present

## 2019-03-03 DIAGNOSIS — Z20828 Contact with and (suspected) exposure to other viral communicable diseases: Secondary | ICD-10-CM | POA: Diagnosis present

## 2019-03-03 DIAGNOSIS — F22 Delusional disorders: Secondary | ICD-10-CM | POA: Diagnosis present

## 2019-03-03 DIAGNOSIS — E860 Dehydration: Secondary | ICD-10-CM | POA: Diagnosis present

## 2019-03-03 DIAGNOSIS — E785 Hyperlipidemia, unspecified: Secondary | ICD-10-CM | POA: Diagnosis not present

## 2019-03-03 DIAGNOSIS — Z880 Allergy status to penicillin: Secondary | ICD-10-CM

## 2019-03-03 DIAGNOSIS — N390 Urinary tract infection, site not specified: Secondary | ICD-10-CM

## 2019-03-03 DIAGNOSIS — Z9049 Acquired absence of other specified parts of digestive tract: Secondary | ICD-10-CM | POA: Diagnosis not present

## 2019-03-03 DIAGNOSIS — R4182 Altered mental status, unspecified: Secondary | ICD-10-CM | POA: Diagnosis not present

## 2019-03-03 DIAGNOSIS — R443 Hallucinations, unspecified: Secondary | ICD-10-CM | POA: Diagnosis present

## 2019-03-03 DIAGNOSIS — Z7982 Long term (current) use of aspirin: Secondary | ICD-10-CM | POA: Diagnosis not present

## 2019-03-03 LAB — URINALYSIS, COMPLETE (UACMP) WITH MICROSCOPIC
Bilirubin Urine: NEGATIVE
Glucose, UA: NEGATIVE mg/dL
Ketones, ur: NEGATIVE mg/dL
Nitrite: NEGATIVE
Protein, ur: NEGATIVE mg/dL
Specific Gravity, Urine: 1.005 (ref 1.005–1.030)
pH: 7 (ref 5.0–8.0)

## 2019-03-03 LAB — COMPREHENSIVE METABOLIC PANEL
ALT: 18 U/L (ref 0–44)
AST: 22 U/L (ref 15–41)
Albumin: 3.5 g/dL (ref 3.5–5.0)
Alkaline Phosphatase: 83 U/L (ref 38–126)
Anion gap: 9 (ref 5–15)
BUN: 10 mg/dL (ref 8–23)
CO2: 28 mmol/L (ref 22–32)
Calcium: 8.5 mg/dL — ABNORMAL LOW (ref 8.9–10.3)
Chloride: 90 mmol/L — ABNORMAL LOW (ref 98–111)
Creatinine, Ser: 0.61 mg/dL (ref 0.44–1.00)
GFR calc Af Amer: >60 mL/min (ref >60)
GFR calc non Af Amer: >60 mL/min (ref >60)
Glucose, Bld: 113 mg/dL — ABNORMAL HIGH (ref 70–99)
Potassium: 2.9 mmol/L — ABNORMAL LOW (ref 3.5–5.1)
Sodium: 127 mmol/L — ABNORMAL LOW (ref 135–145)
Total Bilirubin: 0.4 mg/dL (ref 0.3–1.2)
Total Protein: 5.9 g/dL — ABNORMAL LOW (ref 6.5–8.1)

## 2019-03-03 LAB — CBC
HCT: 33.7 % — ABNORMAL LOW (ref 36.0–46.0)
Hemoglobin: 11.2 g/dL — ABNORMAL LOW (ref 12.0–15.0)
MCH: 27.5 pg (ref 26.0–34.0)
MCHC: 33.2 g/dL (ref 30.0–36.0)
MCV: 82.6 fL (ref 80.0–100.0)
Platelets: 271 10*3/uL (ref 150–400)
RBC: 4.08 MIL/uL (ref 3.87–5.11)
RDW: 13.7 % (ref 11.5–15.5)
WBC: 25.7 10*3/uL — ABNORMAL HIGH (ref 4.0–10.5)
nRBC: 0 % (ref 0.0–0.2)

## 2019-03-03 LAB — SARS CORONAVIRUS 2 BY RT PCR (HOSPITAL ORDER, PERFORMED IN ~~LOC~~ HOSPITAL LAB): SARS Coronavirus 2: NEGATIVE

## 2019-03-03 LAB — GLUCOSE, CAPILLARY: Glucose-Capillary: 112 mg/dL — ABNORMAL HIGH (ref 70–99)

## 2019-03-03 MED ORDER — ENOXAPARIN SODIUM 40 MG/0.4ML ~~LOC~~ SOLN
40.0000 mg | Freq: Every day | SUBCUTANEOUS | Status: DC
  Administered 2019-03-04 (×2): 40 mg via SUBCUTANEOUS
  Filled 2019-03-03 (×2): qty 0.4

## 2019-03-03 MED ORDER — SODIUM CHLORIDE 0.9 % IV SOLN
1.0000 g | INTRAVENOUS | Status: DC
  Administered 2019-03-04: 1 g via INTRAVENOUS
  Filled 2019-03-03: qty 1
  Filled 2019-03-03: qty 10

## 2019-03-03 MED ORDER — SODIUM CHLORIDE 0.9 % IV SOLN
1.0000 g | Freq: Once | INTRAVENOUS | Status: AC
  Administered 2019-03-03: 1 g via INTRAVENOUS
  Filled 2019-03-03: qty 10

## 2019-03-03 MED ORDER — SODIUM CHLORIDE 0.9 % IV SOLN
INTRAVENOUS | Status: AC
  Administered 2019-03-04: via INTRAVENOUS

## 2019-03-03 MED ORDER — ACETAMINOPHEN 325 MG PO TABS
650.0000 mg | ORAL_TABLET | Freq: Four times a day (QID) | ORAL | Status: DC | PRN
  Administered 2019-03-04: 650 mg via ORAL
  Filled 2019-03-03: qty 2

## 2019-03-03 MED ORDER — SERTRALINE HCL 50 MG PO TABS
50.0000 mg | ORAL_TABLET | Freq: Every day | ORAL | Status: DC
  Administered 2019-03-04 – 2019-03-05 (×2): 50 mg via ORAL
  Filled 2019-03-03 (×2): qty 1

## 2019-03-03 MED ORDER — HYDROXYZINE HCL 10 MG PO TABS
10.0000 mg | ORAL_TABLET | Freq: Three times a day (TID) | ORAL | Status: DC | PRN
  Filled 2019-03-03: qty 1

## 2019-03-03 MED ORDER — ASPIRIN 81 MG PO CHEW
81.0000 mg | CHEWABLE_TABLET | Freq: Every day | ORAL | Status: DC
  Administered 2019-03-04 – 2019-03-05 (×2): 81 mg via ORAL
  Filled 2019-03-03 (×2): qty 1

## 2019-03-03 MED ORDER — ACETAMINOPHEN 650 MG RE SUPP: 650.0000 mg | Freq: Four times a day (QID) | RECTAL | Status: DC | PRN

## 2019-03-03 MED ORDER — POTASSIUM CHLORIDE 10 MEQ/100ML IV SOLN
10.0000 meq | INTRAVENOUS | Status: AC
  Administered 2019-03-04 (×4): 10 meq via INTRAVENOUS
  Filled 2019-03-03 (×4): qty 100

## 2019-03-03 MED ORDER — ONDANSETRON HCL 4 MG PO TABS: 4.0000 mg | ORAL_TABLET | Freq: Four times a day (QID) | ORAL | Status: DC | PRN

## 2019-03-03 MED ORDER — SODIUM CHLORIDE 0.9 % IV SOLN
Freq: Once | INTRAVENOUS | Status: AC
  Administered 2019-03-03: 20:00:00 via INTRAVENOUS

## 2019-03-03 MED ORDER — ONDANSETRON HCL 4 MG/2ML IJ SOLN: 4.0000 mg | Freq: Four times a day (QID) | INTRAMUSCULAR | Status: DC | PRN

## 2019-03-03 MED ORDER — SIMVASTATIN 20 MG PO TABS
10.0000 mg | ORAL_TABLET | Freq: Every day | ORAL | Status: DC
  Administered 2019-03-04 (×2): 10 mg via ORAL
  Filled 2019-03-03 (×2): qty 1

## 2019-03-03 NOTE — ED Triage Notes (Signed)
Family reports delusional X 1 week, thinks that it is due to hyponatremia as a result of taking Zoloft. Alert and oriented X4 at this time.

## 2019-03-03 NOTE — ED Notes (Signed)
ED TO INPATIENT HANDOFF REPORT  ED Nurse Name and Phone #:   S Name/Age/Gender Barbara Chambers 84 y.o. female Room/Bed: ED05A/ED05A  Code Status   Code Status: Not on file  Home/SNF/Other Home Patient oriented to: self, place, time and situation Is this baseline? Yes   Triage Complete: Triage complete  Chief Complaint Alt Mental   Triage Note Family reports delusional X 1 week, thinks that it is due to hyponatremia as a result of taking Zoloft. Alert and oriented X4 at this time.    Allergies Allergies  Allergen Reactions  . Penicillins Rash    Level of Care/Admitting Diagnosis ED Disposition    ED Disposition Condition [facility] Area: [place] [[id number]]  Level of Care: Med-Surg [16]  Covid Evaluation: Confirmed COVID Negative  Diagnosis: UTI (urinary tract infection) [id number]  Admitting Physician: Lance Coon [id number]  Attending Physician: Lance Coon [phone]  Estimated length of stay: past midnight tomorrow  Certification:: I certify this patient will need inpatient services for at least 2 midnights  PT Class (Do Not Modify): Inpatient [101]  PT Acc Code (Do Not Modify): Private [1]       B Medical/Surgery History Past Medical History:  Diagnosis Date  . Cancer [facility]) 2011   lymphoma  . Hyperlipidemia   . Hypertension    Past Surgical History:  Procedure Laterality Date  . cataract surgery Bilateral 2005  . CHOLECYSTECTOMY  1991  . RECONSTRUCTION OF EYELID       A IV Location/Drains/Wounds Patient Lines/Drains/Airways Status   Active Line/Drains/Airways    Name:   Placement date:   Placement time:   Site:   Days:   Peripheral IV 03/03/19 Right Wrist   03/03/19    1704    Wrist   less than 1          Intake/Output Last 24 hours  Intake/Output Summary (Last 24 hours) at 03/03/2019 2330 Last data filed at 03/03/2019 2004 Gross per 24 hour  Intake 100 ml  Output -  Net 100 ml     Labs/Imaging Results for orders placed or performed during the hospital encounter of 03/03/19 (from the past 48 hour(s))  Comprehensive metabolic panel     Status: Abnormal   Collection Time: 03/03/19  3:28 PM  Result Value Ref Range   Sodium 127 (L) 135 - 145 mmol/L   Potassium 2.9 (L) 3.5 - 5.1 mmol/L   Chloride 90 (L) 98 - 111 mmol/L   CO2 28 22 - 32 mmol/L   Glucose, Bld 113 (H) 70 - 99 mg/dL   BUN 10 8 - 23 mg/dL   Creatinine, Ser 0.61 0.44 - 1.00 mg/dL   Calcium 8.5 (L) 8.9 - 10.3 mg/dL   Total Protein 5.9 (L) 6.5 - 8.1 g/dL   Albumin 3.5 3.5 - 5.0 g/dL   AST 22 15 - 41 U/L   ALT 18 0 - 44 U/L   Alkaline Phosphatase 83 38 - 126 U/L   Total Bilirubin 0.4 0.3 - 1.2 mg/dL   GFR calc non Af Amer >60 >60 mL/min   GFR calc Af Amer >60 >60 mL/min   Anion gap 9 5 - 15    Comment: Performed at [facility], [address] [place], [place] [address]  CBC     Status: Abnormal   Collection Time: 03/03/19  3:28 PM  Result Value Ref Range   WBC 25.7 (H) 4.0 - 10.5 K/uL   RBC  4.08 3.87 - 5.11 MIL/uL   Hemoglobin 11.2 (L) 12.0 - 15.0 g/dL   HCT 33.7 (L) 36.0 - 46.0 %   MCV 82.6 80.0 - 100.0 fL   MCH 27.5 26.0 - 34.0 pg   MCHC 33.2 30.0 - 36.0 g/dL   RDW 13.7 11.5 - 15.5 %   Platelets 271 150 - 400 K/uL   nRBC 0.0 0.0 - 0.2 %    Comment: Performed at [facility], [address] [place], [place] [address]  Glucose, capillary     Status: Abnormal   Collection Time: 03/03/19  3:31 PM  Result Value Ref Range   Glucose-Capillary 112 (H) 70 - 99 mg/dL  Urinalysis, Complete w Microscopic     Status: Abnormal   Collection Time: 03/03/19  5:02 PM  Result Value Ref Range   Color, Urine STRAW (A) YELLOW   APPearance HAZY (A) CLEAR   Specific Gravity, Urine 1.005 1.005 - 1.030   pH 7.0 5.0 - 8.0   Glucose, UA NEGATIVE NEGATIVE mg/dL   Hgb urine dipstick MODERATE (A) NEGATIVE   Bilirubin Urine NEGATIVE NEGATIVE   Ketones, ur NEGATIVE NEGATIVE mg/dL   Protein,  ur NEGATIVE NEGATIVE mg/dL   Nitrite NEGATIVE NEGATIVE   Leukocytes,Ua LARGE (A) NEGATIVE   RBC / HPF 0-5 0 - 5 RBC/hpf   WBC, UA 11-20 0 - 5 WBC/hpf   Bacteria, UA RARE (A) NONE SEEN   Squamous Epithelial / LPF 0-5 0 - 5    Comment: Performed at [facility], [address], [place], [place] [address]  SARS Coronavirus 2 [facility] order, Performed in [facility] hospital lab) Nasopharyngeal Nasopharyngeal Swab     Status: None   Collection Time: 03/03/19  6:36 PM   Specimen: Nasopharyngeal Swab  Result Value Ref Range   SARS Coronavirus 2 NEGATIVE NEGATIVE    Comment: (NOTE) If result is NEGATIVE SARS-CoV-2 target nucleic acids are NOT DETECTED. The SARS-CoV-2 RNA is generally detectable in upper and lower  respiratory specimens during the acute phase of infection. The lowest  concentration of SARS-CoV-2 viral copies this assay can detect is 250  copies / mL. A negative result does not preclude SARS-CoV-2 infection  and should not be used as the sole basis for treatment or other  patient management decisions.  A negative result may occur with  improper specimen collection / handling, submission of specimen other  than nasopharyngeal swab, presence of viral mutation(s) within the  areas targeted by this assay, and inadequate number of viral copies  (<250 copies / mL). A negative result must be combined with clinical  observations, patient history, and epidemiological information. If result is POSITIVE SARS-CoV-2 target nucleic acids are DETECTED. The SARS-CoV-2 RNA is generally detectable in upper and lower  respiratory specimens dur ing the acute phase of infection.  Positive  results are indicative of active infection with SARS-CoV-2.  Clinical  correlation with patient history and other diagnostic information is  necessary to determine patient infection status.  Positive results do  not rule out bacterial infection or co-infection with other viruses. If result is  PRESUMPTIVE POSTIVE SARS-CoV-2 nucleic acids MAY BE PRESENT.   A presumptive positive result was obtained on the submitted specimen  and confirmed on repeat testing.  While 2019 novel coronavirus  (SARS-CoV-2) nucleic acids may be present in the submitted sample  additional confirmatory testing may be necessary for epidemiological  and / or clinical management purposes  to differentiate between  SARS-CoV-2 and other  Sarbecovirus currently known to infect humans.  If clinically indicated additional testing with an alternate test  methodology [phone]) is advised. The SARS-CoV-2 RNA is generally  detectable in upper and lower respiratory sp ecimens during the acute  phase of infection. The expected result is Negative. Fact Sheet for Patients:  StrictlyIdeas.no Fact Sheet for Healthcare Providers: BankingDealers.co.za This test is not yet approved or cleared by the [place] FDA and has been authorized for detection and/or diagnosis of SARS-CoV-2 by FDA under an Emergency Use Authorization (EUA).  This EUA will remain in effect (meaning this test can be used) for the duration of the COVID-19 declaration under Section 564(b)(1) of the Act, 21 U.S.C. section 360bbb-3(b)(1), unless the authorization is terminated or revoked sooner. Performed at [facility], [address], [place], [place] [address]    Dg Chest 2 View  Result Date: 03/03/2019 CLINICAL DATA:  Altered mental status. EXAM: CHEST - 2 VIEW COMPARISON:  Radiograph December 02, 2018. FINDINGS: The heart size and mediastinal contours are within normal limits. Both lungs are clear. No pneumothorax or pleural effusion is noted. The visualized skeletal structures are unremarkable. IMPRESSION: No active cardiopulmonary disease. Electronically Signed   By: Marijo Conception M.D.   On: 03/03/2019 16:48    Pending Labs Unresulted Labs (From admission, onward)    Start     Ordered    03/03/19 1806  Urine Culture  Add-on,   AD     03/03/19 1805   Signed and Held  CBC  (enoxaparin (LOVENOX)    CrCl >/= 30 ml/min)  Once,   R    Comments: Baseline for enoxaparin therapy IF NOT ALREADY DRAWN.  Notify MD if PLT < 100 K.    Signed and Held   Signed and Held  Creatinine, serum  (enoxaparin (LOVENOX)    CrCl >/= 30 ml/min)  Once,   R    Comments: Baseline for enoxaparin therapy IF NOT ALREADY DRAWN.    Signed and Held   Signed and Held  Creatinine, serum  (enoxaparin (LOVENOX)    CrCl >/= 30 ml/min)  Weekly,   R    Comments: while on enoxaparin therapy    Signed and Held   Signed and Held  Basic metabolic panel  Tomorrow morning,   R     Signed and Held   Signed and Held  CBC  Tomorrow morning,   R     Signed and Held          Vitals/Pain Today's Vitals   03/03/19 2042 03/03/19 2043 03/03/19 2314 03/03/19 2315  BP:    128/74  Pulse:    64  Resp:    18  Temp:      TempSrc:      SpO2: 98%   99%  Weight:      Height:      PainSc:  0-No pain Asleep 0-No pain    Isolation Precautions No active isolations  Medications Medications  cefTRIAXone (ROCEPHIN) 1 g in sodium chloride 0.9 % 100 mL IVPB (0 g Intravenous Stopped 03/03/19 2004)  0.9 %  sodium chloride infusion ( Intravenous New Bag/Given 03/03/19 2018)    Mobility walks High fall risk   Focused Assessments medicine    R Recommendations: See Admitting Provider Note  Report given to:   Additional Notes:

## 2019-03-03 NOTE — H&P (Signed)
[place] [facility] [place] NAME: Barbara Chambers    MR#:  [id number]  DATE OF BIRTH:  17-Jan-1935  DATE OF ADMISSION:  03/03/2019  PRIMARY CARE PHYSICIAN: [name], MD   REQUESTING/REFERRING PHYSICIAN: Kerman Passey, MD  CHIEF COMPLAINT:   Chief Complaint  Patient presents with  . Altered Mental Status    HISTORY OF PRESENT ILLNESS:  Barbara Chambers  is a 84 y.o. female who presents with chief complaint as above.  Patient presents to the ED, brought by family for complaint of worsening delusions and increased weakness for the past week.  She was found to be hyponatremic, and it was recommended by her primary care physician that she come to the emergency department.  She is found here to have a UTI.  Hospitalist were called for admission  PAST MEDICAL HISTORY:   Past Medical History:  Diagnosis Date  . Cancer [facility]) 2011   lymphoma  . Hyperlipidemia   . Hypertension      PAST SURGICAL HISTORY:   Past Surgical History:  Procedure Laterality Date  . cataract surgery Bilateral 2005  . CHOLECYSTECTOMY  1991  . RECONSTRUCTION OF EYELID       SOCIAL HISTORY:   Social History   Tobacco Use  . Smoking status: Never Smoker  . Smokeless tobacco: Never Used  Substance Use Topics  . Alcohol use: No     FAMILY HISTORY:   Family History  Problem Relation Age of Onset  . Hypertension Mother   . Stroke Mother   . CAD Mother   . Hypertension Father   . Heart attack Father   . Hypertension Brother   . Seizures Brother   . Hypertension Son   . Breast cancer Neg Hx      DRUG ALLERGIES:   Allergies  Allergen Reactions  . Penicillins Rash    MEDICATIONS AT HOME:   Prior to Admission medications   Medication Sig Start Date End Date Taking? Authorizing Provider  aspirin 81 MG chewable tablet Chew 81 mg by mouth daily.   Yes [provider]  COENZYME Q-10 PO Take 1 capsule by mouth daily.  05/09/10  Yes  [provider]  hydrochlorothiazide (HYDRODIURIL) 25 MG tablet Take 1 tablet (25 mg total) by mouth daily. 02/15/19  Yes [name], [name], MD  hydrOXYzine (ATARAX/VISTARIL) 10 MG tablet Take 10 mg by mouth 3 (three) times daily as needed.   Yes [provider]  losartan (COZAAR) 100 MG tablet Take 100 mg by mouth daily.   Yes [provider]  sertraline (ZOLOFT) 50 MG tablet Take 3 tablets (150 mg total) by mouth daily. Patient taking differently: Take 50 mg by mouth daily.  03/01/19  Yes [name], [name], MD  simvastatin (ZOCOR) 10 MG tablet Take 1 tablet (10 mg total) by mouth at bedtime. 07/13/18  Yes [name], [name], MD  nystatin ointment (MYCOSTATIN) Apply 1 application topically 2 (two) times daily. 02/17/19   [name], [name], MD    REVIEW OF SYSTEMS:  Review of Systems  Unable to perform ROS: Acuity of condition     VITAL SIGNS:   Vitals:   03/03/19 1930 03/03/19 1945 03/03/19 2000 03/03/19 2042  BP: 139/63  134/60   Pulse: 74 72 70   Resp: 17 14 15    Temp:      TempSrc:      SpO2: 97% 98% 97% 98%  Weight:      Height:  Wt Readings from Last 3 Encounters:  03/03/19 56.2 kg  03/01/19 56.2 kg  02/02/19 57.7 kg    PHYSICAL EXAMINATION:  Physical Exam  Vitals reviewed. Constitutional: She appears well-developed and well-nourished. No distress.  HENT:  Head: Normocephalic and atraumatic.  Mouth/Throat: Oropharynx is clear and moist.  Eyes: Pupils are equal, round, and reactive to light. Conjunctivae and EOM are normal. No scleral icterus.  Neck: Normal range of motion. Neck supple. No JVD present. No thyromegaly present.  Cardiovascular: Normal rate, regular rhythm and intact distal pulses. Exam reveals no gallop and no friction rub.  No murmur heard. Respiratory: Effort normal and breath sounds normal. No respiratory distress. She has no wheezes. She has no rales.  GI: Soft. Bowel sounds are normal. She exhibits no  distension. There is no abdominal tenderness.  Musculoskeletal: Normal range of motion.        General: No edema.     Comments: No arthritis, no gout  Lymphadenopathy:    She has no cervical adenopathy.  Neurological: She is alert. No cranial nerve deficit.  Unable to fully assess due to patient condition.  She has no gross focal deficit  Skin: Skin is warm and dry. No rash noted. No erythema.  Psychiatric:  Unable to fully assess due to patient condition    LABORATORY PANEL:   CBC Recent Labs  Lab 03/03/19 1528  WBC 25.7*  HGB 11.2*  HCT 33.7*  PLT 271   ------------------------------------------------------------------------------------------------------------------  Chemistries  Recent Labs  Lab 03/03/19 1528  NA 127*  K 2.9*  CL 90*  CO2 28  GLUCOSE 113*  BUN 10  CREATININE 0.61  CALCIUM 8.5*  AST 22  ALT 18  ALKPHOS 83  BILITOT 0.4   ------------------------------------------------------------------------------------------------------------------  Cardiac Enzymes No results for input(s): TROPONINI in the last 168 hours. ------------------------------------------------------------------------------------------------------------------  RADIOLOGY:  Dg Chest 2 View  Result Date: 03/03/2019 CLINICAL DATA:  Altered mental status. EXAM: CHEST - 2 VIEW COMPARISON:  Radiograph December 02, 2018. FINDINGS: The heart size and mediastinal contours are within normal limits. Both lungs are clear. No pneumothorax or pleural effusion is noted. The visualized skeletal structures are unremarkable. IMPRESSION: No active cardiopulmonary disease. Electronically Signed   By: Marijo Conception M.D.   On: 03/03/2019 16:48    EKG:   Orders placed or performed in visit on 03/03/19  . EKG 12-Lead    IMPRESSION AND PLAN:  Principal Problem:   UTI (urinary tract infection) -IV antibiotics given, urine culture sent. Active Problems:   Hyponatremia -suspect this is related to her UTI.   IV fluids in place, monitor for improvement   Anxiety -home dose anxiolytic   CLL (chronic lymphocytic leukemia) (HCC) -white blood cell count is elevated, though it appears to be around her baseline on chart review   Benign essential HTN -home dose antihypertensives   Delusional disorder ([place]) -this seems to be baseline, though per family report her delusions are worse for the past week or so.  Likely related to UTI.  Monitor for improvement with treatment as above   HLD (hyperlipidemia) -home dose antilipid  Chart review performed and case discussed with ED provider. Labs, imaging and/or ECG reviewed by provider and discussed with patient/family. Management plans discussed with the patient and/or family.  COVID-19 status: Tested negative     DVT PROPHYLAXIS: SubQ lovenox   GI PROPHYLAXIS:  None  ADMISSION STATUS: Inpatient     CODE STATUS: Full  TOTAL TIME TAKING CARE OF THIS PATIENT: 89  minutes.   This patient was evaluated in the context of the global COVID-19 pandemic, which necessitated consideration that the patient might be at risk for infection with the SARS-CoV-2 virus that causes COVID-19. Institutional protocols and algorithms that pertain to the evaluation of patients at risk for COVID-19 are in a state of rapid change based on information released by regulatory bodies including the CDC and federal and state organizations. These policies and algorithms were followed to the best of this provider's knowledge to date during the patient's care at this facility.  Ethlyn Daniels 03/03/2019, 9:27 PM  [facility]  Office  [phone]  CC: Primary care physician; [name], MD  Note:  This document was prepared using Dragon voice recognition software and may include unintentional dictation errors.

## 2019-03-03 NOTE — Telephone Encounter (Signed)
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  She continues to have intermittent confusion which is different from her baseline.  Briefly: She was an independent functioning person, until June when she had a bed bug infestation in her home. This has all ben taken care of and she has been treated for the subsequent rashes and even for possible Scabies. She is now very anxious and obsessively cleaning her home (has worn out 2 vacuums) and insisting that there are bugs imbedded in her skin. You can see in the chart that I and some of the PAs in my office have seen her multiple times and she no longer has a rash or any bites. I tried Zoloft for her and it was helping some wiht anxiety symptoms, but the delusional thoughts persisted.    I spoke to Dr Shea Evans who advised that she would not be able to see her in office soon and that she likely needs a low dose of risperdal and to be titrated carefully on medicaitons in the hospital setting.  I believe she could also use to be slowly corrected for her sodium and monitored closely while tapering Zoloft.    Spoke to son [name], who agrees to take her to the ED for likely admission.  Will call ED to give heads up.  Jacaden Forbush, [name], MD, MPH [facility]

## 2019-03-03 NOTE — ED Notes (Signed)
Patients son left for home. Would like to be called if any changes or if Md needs inf=ormation about his mother. Patient comfortable at present time. Awaiting bed status.

## 2019-03-03 NOTE — ED Provider Notes (Signed)
[facility] Emergency Department Provider Note  Time seen: 5:05 PM  I have reviewed the triage vital signs and the nursing notes.   HISTORY  Chief Complaint Altered Mental Status   HPI Barbara Chambers is a 84 y.o. female with a past medical history of hypertension, hyperlipidemia, CLL, mild dementia, presents to the emergency department for worsening delusions, generalized weakness.  According to the patient's son since May of this year she has been experiencing mild delusions such as bugs crawling on her.  They believe this is due to her dementia, she has seen her doctor and they recently started her on Zoloft several weeks ago.  Patient had lab work performed 2 days ago and they got a call back today saying that her sodium level was low and she needed to go to the emergency department.  Son states for the past 1 to 2 weeks the patient has been very weak and minimally mobile due to the weakness.  Her physician believed this was likely due to low sodium level.  Here the patient appears well, does admit to weakness denies any fever cough congestion or shortness of breath.  Denies any chest or abdominal pain does have occasional dysuria.  Past Medical History:  Diagnosis Date  . Cancer [facility]) 2011   lymphoma  . Hyperlipidemia   . Hypertension     Patient Active Problem List   Diagnosis Date Noted  . Adjustment disorder with mixed anxiety and depressed mood 03/01/2019  . Obsessional thoughts 03/01/2019  . Delusional disorder ([place]) 03/01/2019  . Cognitive impairment 03/01/2019  . Shortness of breath 07/11/2018  . Vulvar irritation 07/11/2018  . Dizziness 05/18/2017  . Leg cramps 04/20/2017  . Arthritis 03/08/2015  . Anxiety 10/25/2014  . CLL (chronic lymphocytic leukemia) ([place]) 10/25/2014  . HLD (hyperlipidemia) 10/25/2014  . Subclinical hypothyroidism 10/25/2014  . Abnormal ECG 10/11/2014  . Benign essential HTN 10/11/2014    Past Surgical History:   Procedure Laterality Date  . cataract surgery Bilateral 2005  . CHOLECYSTECTOMY  1991  . RECONSTRUCTION OF EYELID      Prior to Admission medications   Medication Sig Start Date End Date Taking? Authorizing Provider  COENZYME Q-10 PO Take by mouth daily.  05/09/10   [provider]  hydrochlorothiazide (HYDRODIURIL) 25 MG tablet Take 1 tablet (25 mg total) by mouth daily. 02/15/19   [name], MD  hydrOXYzine (ATARAX/VISTARIL) 10 MG tablet Take 10 mg by mouth 3 (three) times daily as needed.    [provider]  losartan (COZAAR) 100 MG tablet Take 100 mg by mouth daily.    [provider]  nystatin ointment (MYCOSTATIN) Apply 1 application topically 2 (two) times daily. 02/17/19   [name], MD  sertraline (ZOLOFT) 50 MG tablet Take 3 tablets (150 mg total) by mouth daily. 03/01/19   [name], MD  simvastatin (ZOCOR) 10 MG tablet Take 1 tablet (10 mg total) by mouth at bedtime. 07/13/18   [name], [name], MD    Allergies  Allergen Reactions  . Penicillins Rash    Family History  Problem Relation Age of Onset  . Hypertension Mother   . Stroke Mother   . CAD Mother   . Hypertension Father   . Heart attack Father   . Hypertension Brother   . Seizures Brother   . Hypertension Son   . Breast cancer Neg Hx     Social History Social History   Tobacco Use  .  Smoking status: Never Smoker  . Smokeless tobacco: Never Used  Substance Use Topics  . Alcohol use: No  . Drug use: No    Review of Systems Constitutional: Negative for fever.  Positive for generalized fatigue/weakness Cardiovascular: Negative for chest pain. Respiratory: Negative for shortness of breath. Gastrointestinal: Negative for abdominal pain, vomiting or diarrhea Genitourinary: Positive for dysuria Musculoskeletal: Negative for musculoskeletal complaints Skin: Negative for skin complaints  Neurological: Negative for headache All other ROS  negative  ____________________________________________   PHYSICAL EXAM:  VITAL SIGNS: ED Triage Vitals  Enc Vitals Group     BP 03/03/19 1525 (!) 147/67     Pulse Rate 03/03/19 1525 88     Resp 03/03/19 1525 18     Temp 03/03/19 1525 98.2 F (36.8 C)     Temp Source 03/03/19 1525 Oral     SpO2 03/03/19 1525 96 %     Weight 03/03/19 1526 124 lb (56.2 kg)     Height 03/03/19 1526 5\' 1"  (1.549 m)     Head Circumference --      Peak Flow --      Pain Score 03/03/19 1526 0     Pain Loc --      Pain Edu? --      Excl. in [place]? --    Constitutional: Alert and oriented. Well appearing and in no distress. Eyes: Normal exam ENT      Head: Normocephalic and atraumatic.      Mouth/Throat: Mucous membranes are moist. Cardiovascular: Normal rate, regular rhythm. No murmur Respiratory: Normal respiratory effort without tachypnea nor retractions. Breath sounds are clear  Gastrointestinal: Soft and nontender. No distention. Musculoskeletal: Nontender with normal range of motion in all extremities.  Neurologic:  Normal speech and language. No gross focal neurologic deficits Skin:  Skin is warm, dry and intact.  Psychiatric: Mood and affect are normal.  ____________________________________________    RADIOLOGY  Chest x-ray is negative  ____________________________________________   INITIAL IMPRESSION / ASSESSMENT AND PLAN / ED COURSE  Pertinent labs & imaging results that were available during my care of the patient were reviewed by me and considered in my medical decision making (see chart for details).   Patient presents emergency department for generalized fatigue, weakness and was told she had a low salt level.  Overall the patient appears well, no acute distress does admit to significant weakness.  We will check labs, urinalysis and continue to closely monitor.  We will dose IV fluids while awaiting results.  Patient's labs have resulted showing a sodium of 127, baseline  appears to be 135 or so.  Patient's white blood cell count is elevated to 25,000 which is chronic given her CLL.  Urinalysis pending.  Chest x-ray is clear.  Patient's urinalysis also appears to be consistent with urinary tract infection.  We will start IV Rocephin, send a urine culture.  Given the patient's increased weakness increased confusion with urinary tract infection and hyponatremia we will admit to the hospital service for further treatment.  Barbara Chambers was evaluated in Emergency Department on 03/03/2019 for the symptoms described in the history of present illness. She was evaluated in the context of the global COVID-19 pandemic, which necessitated consideration that the patient might be at risk for infection with the SARS-CoV-2 virus that causes COVID-19. Institutional protocols and algorithms that pertain to the evaluation of patients at risk for COVID-19 are in a state of rapid change based on information released by regulatory bodies including the  CDC and federal and [employer]. These policies and algorithms were followed during the patient's care in the ED.  ____________________________________________   FINAL CLINICAL IMPRESSION(S) / ED DIAGNOSES  Weakness Hyponatremia UTI   Harvest Dark, MD 03/03/19 1800

## 2019-03-04 ENCOUNTER — Other Ambulatory Visit: Payer: Self-pay

## 2019-03-04 LAB — BASIC METABOLIC PANEL
Anion gap: 8 (ref 5–15)
BUN: 7 mg/dL — ABNORMAL LOW (ref 8–23)
CO2: 27 mmol/L (ref 22–32)
Calcium: 7.9 mg/dL — ABNORMAL LOW (ref 8.9–10.3)
Chloride: 97 mmol/L — ABNORMAL LOW (ref 98–111)
Creatinine, Ser: 0.5 mg/dL (ref 0.44–1.00)
GFR calc Af Amer: >60 mL/min (ref >60)
GFR calc non Af Amer: >60 mL/min (ref >60)
Glucose, Bld: 142 mg/dL — ABNORMAL HIGH (ref 70–99)
Potassium: 3.4 mmol/L — ABNORMAL LOW (ref 3.5–5.1)
Sodium: 132 mmol/L — ABNORMAL LOW (ref 135–145)

## 2019-03-04 LAB — CBC
HCT: 31.7 % — ABNORMAL LOW (ref 36.0–46.0)
Hemoglobin: 10.6 g/dL — ABNORMAL LOW (ref 12.0–15.0)
MCH: 27.7 pg (ref 26.0–34.0)
MCHC: 33.4 g/dL (ref 30.0–36.0)
MCV: 83 fL (ref 80.0–100.0)
Platelets: 249 10*3/uL (ref 150–400)
RBC: 3.82 MIL/uL — ABNORMAL LOW (ref 3.87–5.11)
RDW: 13.8 % (ref 11.5–15.5)
WBC: 22 10*3/uL — ABNORMAL HIGH (ref 4.0–10.5)
nRBC: 0 % (ref 0.0–0.2)

## 2019-03-04 MED ORDER — LOSARTAN POTASSIUM 50 MG PO TABS
50.0000 mg | ORAL_TABLET | Freq: Every day | ORAL | Status: DC
  Administered 2019-03-04 – 2019-03-05 (×2): 50 mg via ORAL
  Filled 2019-03-04 (×2): qty 1

## 2019-03-04 MED ORDER — DIPHENHYDRAMINE HCL 50 MG/ML IJ SOLN: 12.5000 mg | Freq: Three times a day (TID) | INTRAMUSCULAR | Status: DC | PRN

## 2019-03-04 NOTE — Progress Notes (Addendum)
[place] [facility] [place] NAME: Barbara Chambers    MR#:  [id number]  DATE OF BIRTH:  23-Feb-1935  SUBJECTIVE:  CHIEF COMPLAINT: Patient is doing better today reporting some weakness and abdominal discomfort with nausea.  Has some rash on her skin  REVIEW OF SYSTEMS:  CONSTITUTIONAL: No fever, fatigue or weakness.  EYES: No blurred or double vision.  EARS, NOSE, AND THROAT: No tinnitus or ear pain.  RESPIRATORY: No cough, shortness of breath, wheezing or hemoptysis.  CARDIOVASCULAR: No chest pain, orthopnea, edema.  GASTROINTESTINAL: Reports nausea, denies vomiting, diarrhea or abdominal pain.  GENITOURINARY: No dysuria, hematuria.  ENDOCRINE: No polyuria, nocturia,  HEMATOLOGY: No anemia, easy bruising or bleeding SKIN: Rash on extremities  MUSCULOSKELETAL: No joint pain or arthritis.   NEUROLOGIC: No tingling, numbness, weakness.  PSYCHIATRY: No anxiety or depression.   DRUG ALLERGIES:   Allergies  Allergen Reactions  . Penicillins Rash    VITALS:  Blood pressure (!) 146/69, pulse 91, temperature (!) 97.5 F (36.4 C), temperature source Axillary, resp. rate 18, height 5\' 2"  (1.575 m), weight 54.9 kg, SpO2 97 %.  PHYSICAL EXAMINATION:  GENERAL:  84 y.o.-year-old patient lying in the bed with no acute distress.  EYES: Pupils equal, round, reactive to light and accommodation. No scleral icterus. Extraocular muscles intact.  HEENT: Head atraumatic, normocephalic. Oropharynx and nasopharynx clear.  NECK:  Supple, no jugular venous distention. No thyroid enlargement, no tenderness.  LUNGS: Normal breath sounds bilaterally, no wheezing, rales,rhonchi or crepitation. No use of accessory muscles of respiration.  CARDIOVASCULAR: S1, S2 normal. No murmurs, rubs, or gallops.  ABDOMEN: Soft, nontender, nondistended. Bowel sounds present.  EXTREMITIES: No pedal edema, cyanosis, or clubbing.  NEUROLOGIC: Cranial nerves II through XII are intact.  Muscle strength 5/5 in all extremities. Sensation intact. Gait not checked.  PSYCHIATRIC: The patient is alert and oriented x 3.  SKIN: Macular rash on the extremities and on the torso   LABORATORY PANEL:   CBC Recent Labs  Lab 03/04/19 0355  WBC 22.0*  HGB 10.6*  HCT 31.7*  PLT 249   ------------------------------------------------------------------------------------------------------------------  Chemistries  Recent Labs  Lab 03/03/19 1528 03/04/19 0355  NA 127* 132*  K 2.9* 3.4*  CL 90* 97*  CO2 28 27  GLUCOSE 113* 142*  BUN 10 7*  CREATININE 0.61 0.50  CALCIUM 8.5* 7.9*  AST 22  --   ALT 18  --   ALKPHOS 83  --   BILITOT 0.4  --    ------------------------------------------------------------------------------------------------------------------  Cardiac Enzymes No results for input(s): TROPONINI in the last 168 hours. ------------------------------------------------------------------------------------------------------------------  RADIOLOGY:  Dg Chest 2 View  Result Date: 03/03/2019 CLINICAL DATA:  Altered mental status. EXAM: CHEST - 2 VIEW COMPARISON:  Radiograph December 02, 2018. FINDINGS: The heart size and mediastinal contours are within normal limits. Both lungs are clear. No pneumothorax or pleural effusion is noted. The visualized skeletal structures are unremarkable. IMPRESSION: No active cardiopulmonary disease. Electronically Signed   By: Marijo Conception M.D.   On: 03/03/2019 16:48    EKG:   Orders placed or performed in visit on 03/03/19  . EKG 12-Lead    ASSESSMENT AND PLAN:   #Acute cystitis Hydrated with with IV fluids continue IV antibiotics IV Rocephin and follow-up on the urine cultures Supportive treatment with antiemetics  #Rash  will try Benadryl as needed if no improvement will add prednisone  #Hyponatremia and hypokalemia Patient is mentating fine clinically improving with IV  fluids Sodium 127-132 after providing IV  fluids Potassium at 3.4 will supplement and recheck in a.m.  #CLL-WBC seems to be at her baseline.  Outpatient follow-up for surveillance as recommended  #Hypertension Resume home medication Cozaar  #Generalized weakness PT assessment  All the records are reviewed and case discussed with Care Management/Social Workerr. Management plans discussed with the patient, family and they are in agreement.  CODE STATUS: fc  TOTAL TIME TAKING CARE OF THIS PATIENT: 85minutes.   POSSIBLE D/C IN 2  DAYS, DEPENDING ON CLINICAL CONDITION.  Note: This dictation was prepared with Dragon dictation along with smaller phrase technology. Any transcriptional errors that result from this process are unintentional.   Nicholes Mango M.D on 03/04/2019 at 12:42 PM  Between 7am to 6pm - Pager - [phone] After 6pm go to [email or web address] - password [email or web address] [facility]  Office  [phone]  CC: Primary care physician; [name], MD

## 2019-03-04 NOTE — Plan of Care (Signed)
  Problem: Urinary Elimination: Goal: Signs and symptoms of infection will decrease Outcome: Progressing   Problem: Health Behavior/Discharge Planning: Goal: Ability to manage health-related needs will improve Outcome: Progressing   Problem: Clinical Measurements: Goal: Ability to maintain clinical measurements within normal limits will improve Outcome: Progressing Goal: Will remain free from infection Outcome: Progressing Goal: Respiratory complications will improve Outcome: Progressing   Problem: Activity: Goal: Risk for activity intolerance will decrease Outcome: Progressing   Problem: Nutrition: Goal: Adequate nutrition will be maintained Outcome: Progressing   Problem: Coping: Goal: Level of anxiety will decrease Outcome: Progressing   Problem: Elimination: Goal: Will not experience complications related to bowel motility Outcome: Progressing Goal: Will not experience complications related to urinary retention Outcome: Progressing   Problem: Pain Managment: Goal: General experience of comfort will improve Outcome: Progressing   Problem: Safety: Goal: Ability to remain free from injury will improve Outcome: Progressing   Problem: Skin Integrity: Goal: Risk for impaired skin integrity will decrease Outcome: Progressing   

## 2019-03-05 LAB — CBC
HCT: 35.8 % — ABNORMAL LOW (ref 36.0–46.0)
Hemoglobin: 12 g/dL (ref 12.0–15.0)
MCH: 27.9 pg (ref 26.0–34.0)
MCHC: 33.5 g/dL (ref 30.0–36.0)
MCV: 83.3 fL (ref 80.0–100.0)
Platelets: 308 10*3/uL (ref 150–400)
RBC: 4.3 MIL/uL (ref 3.87–5.11)
RDW: 14 % (ref 11.5–15.5)
WBC: 21 10*3/uL — ABNORMAL HIGH (ref 4.0–10.5)
nRBC: 0 % (ref 0.0–0.2)

## 2019-03-05 LAB — URINE CULTURE: Culture: 80000 — AB

## 2019-03-05 MED ORDER — SERTRALINE HCL 50 MG PO TABS: 50.0000 mg | ORAL_TABLET | Freq: Every day | ORAL | 0 refills | Status: DC

## 2019-03-05 MED ORDER — CEFDINIR 300 MG PO CAPS: 300.0000 mg | ORAL_CAPSULE | Freq: Two times a day (BID) | ORAL | 0 refills | Status: DC

## 2019-03-05 MED ORDER — CEFDINIR 300 MG PO CAPS
300.0000 mg | ORAL_CAPSULE | Freq: Two times a day (BID) | ORAL | Status: DC
  Administered 2019-03-05: 300 mg via ORAL
  Filled 2019-03-05 (×2): qty 1

## 2019-03-05 NOTE — Discharge Instructions (Signed)
Follow-up with primary care physician in 4 to 5 days.  PCP to follow-up on the pending urine culture and sensitivity Follow-up with Dr. [name] as scheduled Home health PT with rolling walker
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
## 2019-03-05 NOTE — TOC Transition Note (Signed)
Transition of Care [facility]) - CM/SW Discharge Note   Patient Details  Name: Barbara Chambers MRN: [id number] Date of Birth: 04/03/1935  [facility]) CM/SW Contact:  Latanya Maudlin, RN Phone Number: 03/05/2019, 11:19 AM   Clinical Narrative:  Patient to be discharged per MD order. Orders in place for home health services. CMS Medicare.gov Compare Post Acute Care list reviewed with patient and she has no preference of agency. Referral to [facility] with [facility]. Patient has two rolling walker at home. Family to transport.      Final next level of care: Home w Home Health Services Barriers to Discharge: No Barriers Identified   Patient Goals and CMS Choice   CMS Medicare.gov Compare Post Acute Care list provided to:: Patient Choice offered to / list presented to : Patient  Discharge Placement                       Discharge Plan and Services                          HH Arranged: RN, PT, OT [facility] Agency: [facility] Date [facility] Agency Contacted: 03/05/19 Time [place]: 1119 Representative spoke with at [place]: [place] ([place]) Interventions     Readmission Risk Interventions Readmission Risk Prevention Plan 03/05/2019  Transportation Screening Complete  PCP or Specialist Appt within 5-7 Days Complete  Home Care Screening Complete  Medication Review (RN CM) Complete  Some recent data might be hidden

## 2019-03-05 NOTE — Discharge Summary (Signed)
[place] [facility] [place] NAME: Barbara Chambers    MR#:  [id number]  DATE OF BIRTH:  June 03, 1935  DATE OF ADMISSION:  03/03/2019 ADMITTING PHYSICIAN: Lance Coon, MD  DATE OF DISCHARGE:  03/05/19  PRIMARY CARE PHYSICIAN: [name], MD    ADMISSION DIAGNOSIS:  Lower urinary tract infectious disease [N39.0] Hyponatremia [E87.1] Weakness [R53.1]  DISCHARGE DIAGNOSIS:  Principal Problem:   UTI (urinary tract infection) Active Problems:   Anxiety   CLL (chronic lymphocytic leukemia) (HCC)   HLD (hyperlipidemia)   Benign essential HTN   Delusional disorder ([place])   Hyponatremia   SECONDARY DIAGNOSIS:   Past Medical History:  Diagnosis Date  . Cancer [facility]) 2011   lymphoma  . Hyperlipidemia   . Hypertension     HOSPITAL COURSE:  Barbara Chambers  is a 84 y.o. female who presents with chief complaint as above.  Patient presents to the ED, brought by family for complaint of worsening delusions and increased weakness for the past week.  She was found to be hyponatremic, and it was recommended by her primary care physician that she come to the emergency department.  She is found here to have a UTI.  Hospitalist were called for admission   Acute cystitis Hydrated with with IV fluids provided IV antibiotics IV Rocephin.  Will discharge patient home with p.o. Omnicef for another 5 days as clinically she is doing much better no fever.  Patient has chronic leukocytosis from CLL  PCP to  follow-up on the pending urine cultures Supportive treatment with antiemetics  #Rash  Improved with Benadryl as needed  #Hyponatremia and hypokalemia Patient is mentating fine clinically improved with IV fluids Sodium 127->132 after providing IV fluids and discontinuing hydrochlorothiazide Potassium at 3.4 will supplemented with potassium supplements Hydrochlorothiazide discontinued Primary care physician has changed the Zoloft dose to 50 mg from 150  mg.  We will continue the same dose and if needed outpatient psychiatry follow-up is recommended  #CLL-WBC seems to be at her baseline.  Outpatient follow-up for surveillance as recommended with Dr. Celesta Aver  #Hypertension Resume home medication Cozaar.  Discontinued hydrochlorothiazide in view of hyponatremia  #Brief episode of hallucination could be from dehydration and acute cystitis which is completely resolved, discussed with the son the patient to be seen by outpatient psychiatrist if he notices any more episodes of hallucination in the future  #Generalized weakness PT assessment-recommending home health PT patient is agreeable rolling walker with 5 inch wheels  Plan of care discussed in detail with the patient and her son over phone both verbalized understanding of the plan   DISCHARGE CONDITIONS:   fair  CONSULTS OBTAINED:     PROCEDURES  None   DRUG ALLERGIES:   Allergies  Allergen Reactions  . Penicillins Rash    DISCHARGE MEDICATIONS:   Allergies as of 03/05/2019      Reactions   Penicillins Rash      Medication List    STOP taking these medications   hydrochlorothiazide 25 MG tablet Commonly known as: HYDRODIURIL     TAKE these medications   aspirin 81 MG chewable tablet Chew 81 mg by mouth daily.   cefdinir 300 MG capsule Commonly known as: OMNICEF Take 1 capsule (300 mg total) by mouth every 12 (twelve) hours.   COENZYME Q-10 PO Take 1 capsule by mouth daily.   hydrOXYzine 10 MG tablet Commonly known as: ATARAX/VISTARIL Take 10 mg by mouth 3 (three) times daily as  needed.   losartan 100 MG tablet Commonly known as: COZAAR Take 100 mg by mouth daily.   nystatin ointment Commonly known as: MYCOSTATIN Apply 1 application topically 2 (two) times daily.   sertraline 50 MG tablet Commonly known as: ZOLOFT Take 1 tablet (50 mg total) by mouth daily.   simvastatin 10 MG tablet Commonly known as: ZOCOR Take 1 tablet (10 mg total) by  mouth at bedtime.            Durable Medical Equipment  (From admission, onward)         Start     Ordered   03/05/19 0847  For home use only DME Walker rolling  Once    Comments: rw with 5inch wheels  Question:  Patient needs a walker to treat with the following condition  Answer:  Weakness   03/05/19 0847           DISCHARGE INSTRUCTIONS:   Follow-up with primary care physician in 4 to 5 days.  PCP to follow-up on the pending urine culture and sensitivity Follow-up with Dr. [name] as scheduled Home health PT with rolling walker  DIET:  Low-sodium  DISCHARGE CONDITION:  Stable  ACTIVITY:  Activity as tolerated with rolling walker  OXYGEN:  Home Oxygen: No.   Oxygen Delivery: room air  DISCHARGE LOCATION:  home   If you experience worsening of your admission symptoms, develop shortness of breath, life threatening emergency, suicidal or homicidal thoughts you must seek medical attention immediately by calling 911 or calling your MD immediately  if symptoms less severe.  You Must read complete instructions/literature along with all the possible adverse reactions/side effects for all the Medicines you take and that have been prescribed to you. Take any new Medicines after you have completely understood and accpet all the possible adverse reactions/side effects.   Please note  You were cared for by a hospitalist during your hospital stay. If you have any questions about your discharge medications or the care you received while you were in the hospital after you are discharged, you can call the unit and asked to speak with the hospitalist on call if the hospitalist that took care of you is not available. Once you are discharged, your primary care physician will handle any further medical issues. Please note that NO REFILLS for any discharge medications will be authorized once you are discharged, as it is imperative that you return to your primary care physician (or  establish a relationship with a primary care physician if you do not have one) for your aftercare needs so that they can reassess your need for medications and monitor your lab values.     Today  Chief Complaint  Patient presents with  . Altered Mental Status   Patient is feeling much better denies any complaints answering all questions appropriately denies any abdominal pain back pain or nausea or vomiting.  Really want to go home.  ROS:  CONSTITUTIONAL: Denies fevers, chills. Denies any fatigue, weakness.  EYES: Denies blurry vision, double vision, eye pain. EARS, NOSE, THROAT: Denies tinnitus, ear pain, hearing loss. RESPIRATORY: Denies cough, wheeze, shortness of breath.  CARDIOVASCULAR: Denies chest pain, palpitations, edema.  GASTROINTESTINAL: Denies nausea, vomiting, diarrhea, abdominal pain. Denies bright red blood per rectum. GENITOURINARY: Denies dysuria, hematuria. ENDOCRINE: Denies nocturia or thyroid problems. HEMATOLOGIC AND LYMPHATIC: Denies easy bruising or bleeding. SKIN: Denies rash or lesion. MUSCULOSKELETAL: Denies pain in neck, back, shoulder, knees, hips or arthritic symptoms.  NEUROLOGIC: Denies paralysis, paresthesias.  PSYCHIATRIC: Denies anxiety or depressive symptoms.   VITAL SIGNS:  Blood pressure (!) 150/74, pulse 77, temperature 97.7 F (36.5 C), temperature source Oral, resp. rate 16, height 5\' 2"  (1.575 m), weight 54.9 kg, SpO2 97 %.  I/O:    Intake/Output Summary (Last 24 hours) at 03/05/2019 1002 Last data filed at 03/04/2019 2132 Gross per 24 hour  Intake 220 ml  Output -  Net 220 ml    PHYSICAL EXAMINATION:  GENERAL:  84 y.o.-year-old patient lying in the bed with no acute distress.  EYES: Pupils equal, round, reactive to light and accommodation. No scleral icterus. Extraocular muscles intact.  HEENT: Head atraumatic, normocephalic. Oropharynx and nasopharynx clear.  NECK:  Supple, no jugular venous distention. No thyroid enlargement, no  tenderness.  LUNGS: Normal breath sounds bilaterally, no wheezing, rales,rhonchi or crepitation. No use of accessory muscles of respiration.  CARDIOVASCULAR: S1, S2 normal. No murmurs, rubs, or gallops.  ABDOMEN: Soft, non-tender, non-distended. Bowel sounds present.  EXTREMITIES: No pedal edema, cyanosis, or clubbing.  NEUROLOGIC: Cranial nerves II through XII are intact. Muscle strength generalized weakness in all extremities. Sensation intact. Gait not checked.  PSYCHIATRIC: The patient is alert and oriented x 3.  SKIN: No obvious rash, lesion, or ulcer.   DATA REVIEW:   CBC Recent Labs  Lab 03/05/19 0523  WBC 21.0*  HGB 12.0  HCT 35.8*  PLT 308    Chemistries  Recent Labs  Lab 03/03/19 1528 03/04/19 0355  NA 127* 132*  K 2.9* 3.4*  CL 90* 97*  CO2 28 27  GLUCOSE 113* 142*  BUN 10 7*  CREATININE 0.61 0.50  CALCIUM 8.5* 7.9*  AST 22  --   ALT 18  --   ALKPHOS 83  --   BILITOT 0.4  --     Cardiac Enzymes No results for input(s): TROPONINI in the last 168 hours.  Microbiology Results  Results for orders placed or performed during the hospital encounter of 03/03/19  Urine Culture     Status: Abnormal   Collection Time: 03/03/19  5:02 PM   Specimen: Urine, Random  Result Value Ref Range Status   Specimen Description   Final    URINE, RANDOM Performed at [facility], [address], [place], [place] [address]    Special Requests   Final    NONE Performed at [facility], [address] [place], [place] [address]    Culture (A)  Final    80,000 COLONIES/mL MULTIPLE SPECIES PRESENT, SUGGEST RECOLLECTION   Report Status 03/05/2019 FINAL  Final  SARS Coronavirus 2 [facility] order, Performed in [facility] hospital lab) Nasopharyngeal Nasopharyngeal Swab     Status: None   Collection Time: 03/03/19  6:36 PM   Specimen: Nasopharyngeal Swab  Result Value Ref Range Status   SARS Coronavirus 2 NEGATIVE NEGATIVE Final    Comment: (NOTE) If  result is NEGATIVE SARS-CoV-2 target nucleic acids are NOT DETECTED. The SARS-CoV-2 RNA is generally detectable in upper and lower  respiratory specimens during the acute phase of infection. The lowest  concentration of SARS-CoV-2 viral copies this assay can detect is 250  copies / mL. A negative result does not preclude SARS-CoV-2 infection  and should not be used as the sole basis for treatment or other  patient management decisions.  A negative result may occur with  improper specimen collection / handling, submission of specimen other  than nasopharyngeal swab, presence of viral mutation(s) within the  areas targeted by this  assay, and inadequate number of viral copies  (<250 copies / mL). A negative result must be combined with clinical  observations, patient history, and epidemiological information. If result is POSITIVE SARS-CoV-2 target nucleic acids are DETECTED. The SARS-CoV-2 RNA is generally detectable in upper and lower  respiratory specimens dur ing the acute phase of infection.  Positive  results are indicative of active infection with SARS-CoV-2.  Clinical  correlation with patient history and other diagnostic information is  necessary to determine patient infection status.  Positive results do  not rule out bacterial infection or co-infection with other viruses. If result is PRESUMPTIVE POSTIVE SARS-CoV-2 nucleic acids MAY BE PRESENT.   A presumptive positive result was obtained on the submitted specimen  and confirmed on repeat testing.  While 2019 novel coronavirus  (SARS-CoV-2) nucleic acids may be present in the submitted sample  additional confirmatory testing may be necessary for epidemiological  and / or clinical management purposes  to differentiate between  SARS-CoV-2 and other Sarbecovirus currently known to infect humans.  If clinically indicated additional testing with an alternate test  methodology [phone]) is advised. The SARS-CoV-2 RNA is generally   detectable in upper and lower respiratory sp ecimens during the acute  phase of infection. The expected result is Negative. Fact Sheet for Patients:  StrictlyIdeas.no Fact Sheet for Healthcare Providers: BankingDealers.co.za This test is not yet approved or cleared by the [place] FDA and has been authorized for detection and/or diagnosis of SARS-CoV-2 by FDA under an Emergency Use Authorization (EUA).  This EUA will remain in effect (meaning this test can be used) for the duration of the COVID-19 declaration under Section 564(b)(1) of the Act, 21 U.S.C. section 360bbb-3(b)(1), unless the authorization is terminated or revoked sooner. Performed at [facility], [address] [place], [place] [address]     RADIOLOGY:  Dg Chest 2 View  Result Date: 03/03/2019 CLINICAL DATA:  Altered mental status. EXAM: CHEST - 2 VIEW COMPARISON:  Radiograph December 02, 2018. FINDINGS: The heart size and mediastinal contours are within normal limits. Both lungs are clear. No pneumothorax or pleural effusion is noted. The visualized skeletal structures are unremarkable. IMPRESSION: No active cardiopulmonary disease. Electronically Signed   By: Marijo Conception M.D.   On: 03/03/2019 16:48    EKG:   Orders placed or performed in visit on 03/03/19  . EKG 12-Lead      Management plans discussed with the patient, family and they are in agreement.  CODE STATUS:     Code Status Orders  (From admission, onward)         Start     Ordered   03/03/19 2352  Full code  Continuous     03/03/19 2352        Code Status History    This patient has a current code status but no historical code status.   Advance Care Planning Activity    Advance Directive Documentation     Most Recent Value  Type of Advance Directive  Healthcare Power of Attorney, Living will  Pre-existing out of facility DNR order (yellow form or pink MOST form)  -  "MOST"  Form in Place?  -      TOTAL TIME TAKING CARE OF THIS PATIENT: 43  minutes.   Note: This dictation was prepared with Dragon dictation along with smaller phrase technology. Any transcriptional errors that result from this process are unintentional.   @[facility] @  on 03/05/2019 at 10:02 AM  Between 7am  to 6pm - Pager - [phone]  After 6pm go to [email or web address] - password [email or web address] [place] [facility]  Office  [phone]  CC: Primary care physician; [name], MD

## 2019-03-06 NOTE — Plan of Care (Signed)
  Problem: Urinary Elimination: Goal: Signs and symptoms of infection will decrease Outcome: Adequate for Discharge   Problem: Health Behavior/Discharge Planning: Goal: Ability to manage health-related needs will improve Outcome: Adequate for Discharge   Problem: Clinical Measurements: Goal: Ability to maintain clinical measurements within normal limits will improve Outcome: Adequate for Discharge Goal: Will remain free from infection Outcome: Adequate for Discharge Goal: Respiratory complications will improve Outcome: Adequate for Discharge   Problem: Activity: Goal: Risk for activity intolerance will decrease Outcome: Adequate for Discharge   Problem: Nutrition: Goal: Adequate nutrition will be maintained Outcome: Adequate for Discharge   Problem: Coping: Goal: Level of anxiety will decrease Outcome: Adequate for Discharge   Problem: Elimination: Goal: Will not experience complications related to bowel motility Outcome: Adequate for Discharge Goal: Will not experience complications related to urinary retention Outcome: Adequate for Discharge   Problem: Pain Managment: Goal: General experience of comfort will improve Outcome: Adequate for Discharge   Problem: Safety: Goal: Ability to remain free from injury will improve Outcome: Adequate for Discharge   Problem: Skin Integrity: Goal: Risk for impaired skin integrity will decrease Outcome: Adequate for Discharge

## 2019-03-07 ENCOUNTER — Telehealth: Payer: Self-pay

## 2019-03-07 ENCOUNTER — Ambulatory Visit: Payer: Self-pay | Admitting: *Deleted

## 2019-03-07 NOTE — Chronic Care Management (AMB) (Signed)
   Chronic Care Management   Unsuccessful Call Note 03/07/2019 Name: Barbara Chambers MRN: [id number] DOB: March 25, 1935  Patient  is a 84 year old female who sees Dr. [name] for primary care. Dr. [name] asked the CCM team to consult the patient for Mental Health Counseling and Resources.  Referral was placed 03/01/2019. Patient's last office visit was 03/01/2019.     This social worker was unable to reach patient via telephone today to consent for CCM services. I was unable to leave a HIPAA compliant voicemail asking patient to return my call as there was no voicemail set up. (unsuccessful outreach #1).   Plan: Will follow-up within 7 business days via telephone.      Elliot Gurney, [place] Worker  [facility] [phone]

## 2019-03-07 NOTE — Telephone Encounter (Signed)
[name] [name] is requesting a call back from Dr. [name]. [name] advised that Dr. Jacinto Reap is out of the office until tomorrow.

## 2019-03-07 NOTE — Telephone Encounter (Signed)
McKenzie scheduled patient a hospital follow up for 9/9 and advised son to discuss hospital visit at that appointment.

## 2019-03-07 NOTE — Telephone Encounter (Signed)
Transition Care Management Follow-up Telephone Call  Date of discharge and from where: [facility] on 03/05/19  How have you been since you were released from the hospital? Not doing well. Pt states she is still bothered by the "bug bites." Pt is very frustrated because she said the hospital did not treat her bites. Pt described bites as stinging. Pt did see some blood on her sheets yesterday but declined any open wounds. In ER note it mentions pt has dementia and this is related to the bites that are not present. Declines any urinary s/s, fever or n/v/d. Decided to speak with son for the rest of the call. Per son pt was hoping for a neurology referral due to s/s related to dementia but when pt tested positive for a UTI the focus was on that and no referral was made. Son inquired about a referral to the hospital doctor and was told it was not needed because her issues were related to a UTI. Son states pt was ready to go home and was "on her best behavior and answering questions at best to get out of the hospital quicker."   Any questions or concerns? Bites are not getting better per pt. Son is still awaiting the referral.   Items Reviewed:  Did the pt receive and understand the discharge instructions provided? Yes   Medications obtained and verified? Yes   Any new allergies since your discharge? No   Dietary orders reviewed? N/A  Do you have support at home? Lives alone but has her son and sister that come by to check on her a few times a day.  Other (ie: DME, Home Health, etc) PT ordered for home visits.   Functional Questionnaire: (I = Independent and D = Dependent)  Bathing/Dressing- I   Meal Prep- I  Eating- I  Maintaining continence- I  Transferring/Ambulation- I  Managing Meds- D, son manages medications   Follow up appointments reviewed:    PCP Hospital f/u appt confirmed? Yes  Scheduled to see Dr [name] on 03/08/19 @ 2:00 PM.  [facility] f/u appt confirmed? N/A    Are transportation arrangements needed? No   If their condition worsens, is the pt aware to call  their PCP or go to the ED? Yes  Was the patient provided with contact information for the PCP's office or ED? Yes  Was the pt encouraged to call back with questions or concerns? Yes

## 2019-03-07 NOTE — Telephone Encounter (Signed)
No HFU scheduled.  

## 2019-03-07 NOTE — Progress Notes (Signed)
Patient: Barbara Chambers Female    DOB: 01-Feb-1935   84 y.o.   MRN: [id number] Visit Date: 03/08/2019  Today's Provider: Lavon Paganini, MD   Chief Complaint  Patient presents with  . Hospitalization Follow-up   Subjective:    HPI   Follow up Hospitalization  Patient was admitted to [facility] on 03/03/2019 and discharged on 03/05/2019. She was treated for UTI. Treatment for this included:IV rocephin. Telephone follow up was done on 03/07/2019 She reports good compliance with treatment. She reports this condition is Improved but son states that patient still is worrying a lot.   Zoloft at higher dose was helping with her mood and anxiety, but this had to be decreased due to her hyponatremia.  Her HCTZ was also stopped during hospitalization for hyponatremia.  Hyponatremia was improving before discharge  Son reports that she does seem brighter and more calm today than she has in the last few months.  I did note that during her hospitalization, they mentioned that her altered mental status was likely related to her baseline dementia and delusional disorder.  I will clarify again in the chart that prior to June, she did not have any delusional thoughts or any signs of dementia.  She has passed an MMSE within the last few months ------------------------------------------------------------------------------------     Allergies  Allergen Reactions  . Penicillins Rash     Current Outpatient Medications:  .  aspirin 81 MG chewable tablet, Chew 81 mg by mouth daily., Disp: , Rfl:  .  cefdinir (OMNICEF) 300 MG capsule, Take 1 capsule (300 mg total) by mouth every 12 (twelve) hours., Disp: 10 capsule, Rfl: 0 .  COENZYME Q-10 PO, Take 1 capsule by mouth daily. , Disp: , Rfl:  .  hydrOXYzine (ATARAX/VISTARIL) 10 MG tablet, Take 10 mg by mouth 3 (three) times daily as needed., Disp: , Rfl:  .  losartan (COZAAR) 100 MG tablet, Take 100 mg by mouth daily., Disp: , Rfl:  .  nystatin ointment  (MYCOSTATIN), Apply 1 application topically 2 (two) times daily. (Patient taking differently: Apply 1 application topically 2 (two) times daily. Tapering down), Disp: 30 g, Rfl: 0 .  sertraline (ZOLOFT) 50 MG tablet, Take 1 tablet (50 mg total) by mouth daily., Disp: 30 tablet, Rfl: 0 .  simvastatin (ZOCOR) 10 MG tablet, Take 1 tablet (10 mg total) by mouth at bedtime., Disp: 90 tablet, Rfl: 3  Review of Systems  Constitutional: Negative.   Respiratory: Negative.   Cardiovascular: Negative.   Neurological: Positive for headaches.    Social History   Tobacco Use  . Smoking status: Never Smoker  . Smokeless tobacco: Never Used  Substance Use Topics  . Alcohol use: No      Objective:   BP (!) 144/88 (BP Location: Right Arm, Patient Position: Sitting, Cuff Size: Normal)   Pulse (!) 104   Temp (!) 96.6 F (35.9 C) (Temporal)   Wt 125 lb 9.6 oz (57 kg)   SpO2 96%   BMI 22.97 kg/m  Vitals:   03/08/19 1407  BP: (!) 144/88  Pulse: (!) 104  Temp: (!) 96.6 F (35.9 C)  TempSrc: Temporal  SpO2: 96%  Weight: 125 lb 9.6 oz (57 kg)  Body mass index is 22.97 kg/m.   Physical Exam Constitutional:      General: She is not in acute distress.    Appearance: Normal appearance. She is not diaphoretic.  HENT:     Head: Normocephalic and  atraumatic.  Eyes:     General: No scleral icterus.    Conjunctiva/sclera: Conjunctivae normal.  Neck:     Musculoskeletal: Neck supple.  Cardiovascular:     Rate and Rhythm: Normal rate and regular rhythm.     Pulses: Normal pulses.     Heart sounds: Normal heart sounds. No murmur.  Pulmonary:     Effort: Pulmonary effort is normal. No respiratory distress.     Breath sounds: Normal breath sounds. No wheezing or rhonchi.  Abdominal:     General: There is no distension.     Palpations: Abdomen is soft.     Tenderness: There is no abdominal tenderness. There is no right CVA tenderness or left CVA tenderness.  Musculoskeletal:     Right lower  leg: No edema.     Left lower leg: No edema.  Lymphadenopathy:     Cervical: No cervical adenopathy.  Skin:    General: Skin is warm and dry.     Capillary Refill: Capillary refill takes less than 2 seconds.     Findings: No rash.  Neurological:     Mental Status: She is alert and oriented to person, place, and time.  Psychiatric:        Attention and Perception: Attention normal.        Mood and Affect: Affect normal. Mood is anxious.        Speech: Speech normal.        Behavior: Behavior is not agitated, aggressive or withdrawn. Behavior is cooperative.        Thought Content: Thought content does not include homicidal or suicidal ideation.     Comments: Patient's affect is back to normal today.  She is no longer agitated or withdrawn.  She does still believe that there are bugs biting her and under her skin      No results found for any visits on 03/08/19.     Assessment & Plan   Problem List Items Addressed This Visit      Cardiovascular and Mediastinum   Benign essential HTN    Slightly elevated today Holding HCTZ in the setting of hyponatremia Repeat renal function panel today Could consider addition of amlodipine instead of HCTZ if remains elevated at next visit in 1 week        Other   Adjustment disorder with mixed anxiety and depressed mood    Uncontrolled Stop Zoloft in the setting of hyponatremia after tapering down last week We will start Cymbalta 20 mg daily instead Referral to psychiatry again      Relevant Orders   Ambulatory referral to Psychiatry   Obsessional thoughts    Patient with perseveration on cleanliness and bugs being in her home As she developed hyponatremia after starting Zoloft, we will discontinue this and start Cymbalta instead She does need a psychiatric evaluation, so urgent referral was placed today      Relevant Orders   Ambulatory referral to Psychiatry   Delusional disorder [facility])    Patient seems closer to baseline today  after her hospitalization treatment for UTI, but she does continue to have delusions about bugs in her home and under her skin She has been reassured multiple times in multiple exterminators to found no bugs in her home and she has no rash or evidence of bug bites Continue to have concern for delusional disorder Of note, this is not the patient's baseline and is new since June and was not related to altered mental status in the  setting of a UTI as this has not improved Referral again to psychiatry      Relevant Orders   Ambulatory referral to Psychiatry   Cognitive impairment    At baseline, patient does not have any signs of dementia, but since her delusions have started, she has become more forgetful She previously was evaluated with MMSE which was normal There may be some underlying mild cognitive impairment that could be playing a role in her delusions, but I also wonder about pseudodementia Discussed with patient and her son that she has an upcoming appointment with neurology She also needs psychiatric evaluation      Hyponatremia - Primary    New problem Concerned that this may be related to Zoloft as this was recently started We will stop Zoloft as above Continue to hold HCTZ as well This may have contributed to her altered mental status as well Repeat renal function panel today and again next week      Relevant Orders   Renal Function Panel       Return in about 1 week (around 03/15/2019) for hyponatremia follow-up.   The entirety of the information documented in the History of Present Illness, Review of Systems and Physical Exam were personally obtained by me. Portions of this information were initially documented by [facility], CMA and reviewed by me for thoroughness and accuracy.    Caeley Dohrmann, [name], MD MPH [facility]

## 2019-03-08 ENCOUNTER — Encounter: Payer: Self-pay | Admitting: Family Medicine

## 2019-03-08 ENCOUNTER — Ambulatory Visit (INDEPENDENT_AMBULATORY_CARE_PROVIDER_SITE_OTHER): Payer: PPO | Admitting: Family Medicine

## 2019-03-08 ENCOUNTER — Other Ambulatory Visit: Payer: Self-pay

## 2019-03-08 VITALS — BP 144/88 | HR 104 | Temp 96.6°F | Wt 125.6 lb

## 2019-03-08 DIAGNOSIS — F428 Other obsessive-compulsive disorder: Secondary | ICD-10-CM

## 2019-03-08 DIAGNOSIS — I10 Essential (primary) hypertension: Secondary | ICD-10-CM | POA: Diagnosis not present

## 2019-03-08 DIAGNOSIS — E871 Hypo-osmolality and hyponatremia: Secondary | ICD-10-CM

## 2019-03-08 DIAGNOSIS — F4323 Adjustment disorder with mixed anxiety and depressed mood: Secondary | ICD-10-CM | POA: Diagnosis not present

## 2019-03-08 DIAGNOSIS — R4189 Other symptoms and signs involving cognitive functions and awareness: Secondary | ICD-10-CM

## 2019-03-08 DIAGNOSIS — F22 Delusional disorders: Secondary | ICD-10-CM

## 2019-03-08 MED ORDER — DULOXETINE HCL 20 MG PO CPEP: 20.0000 mg | ORAL_CAPSULE | Freq: Every day | ORAL | 3 refills | Status: DC

## 2019-03-08 NOTE — Assessment & Plan Note (Signed)
Patient with perseveration on cleanliness and bugs being in her home As she developed hyponatremia after starting Zoloft, we will discontinue this and start Cymbalta instead She does need a psychiatric evaluation, so urgent referral was placed today

## 2019-03-08 NOTE — Patient Instructions (Signed)
Stop Zoloft Start Cymbalta 20mg  daily  Hold HCTZ - may add new BP med next week

## 2019-03-08 NOTE — Assessment & Plan Note (Signed)
New problem Concerned that this may be related to Zoloft as this was recently started We will stop Zoloft as above Continue to hold HCTZ as well This may have contributed to her altered mental status as well Repeat renal function panel today and again next week

## 2019-03-08 NOTE — Assessment & Plan Note (Signed)
Slightly elevated today Holding HCTZ in the setting of hyponatremia Repeat renal function panel today Could consider addition of amlodipine instead of HCTZ if remains elevated at next visit in 1 week

## 2019-03-08 NOTE — Assessment & Plan Note (Signed)
Patient seems closer to baseline today after her hospitalization treatment for UTI, but she does continue to have delusions about bugs in her home and under her skin She has been reassured multiple times in multiple exterminators to found no bugs in her home and she has no rash or evidence of bug bites Continue to have concern for delusional disorder Of note, this is not the patient's baseline and is new since June and was not related to altered mental status in the setting of a UTI as this has not improved Referral again to psychiatry

## 2019-03-08 NOTE — Assessment & Plan Note (Signed)
Uncontrolled Stop Zoloft in the setting of hyponatremia after tapering down last week We will start Cymbalta 20 mg daily instead Referral to psychiatry again

## 2019-03-08 NOTE — Assessment & Plan Note (Signed)
At baseline, patient does not have any signs of dementia, but since her delusions have started, she has become more forgetful She previously was evaluated with MMSE which was normal There may be some underlying mild cognitive impairment that could be playing a role in her delusions, but I also wonder about pseudodementia Discussed with patient and her son that she has an upcoming appointment with neurology She also needs psychiatric evaluation

## 2019-03-09 ENCOUNTER — Telehealth: Payer: Self-pay

## 2019-03-09 DIAGNOSIS — R21 Rash and other nonspecific skin eruption: Secondary | ICD-10-CM | POA: Diagnosis not present

## 2019-03-09 DIAGNOSIS — N390 Urinary tract infection, site not specified: Secondary | ICD-10-CM | POA: Diagnosis not present

## 2019-03-09 DIAGNOSIS — N3000 Acute cystitis without hematuria: Secondary | ICD-10-CM | POA: Diagnosis not present

## 2019-03-09 DIAGNOSIS — F419 Anxiety disorder, unspecified: Secondary | ICD-10-CM | POA: Diagnosis not present

## 2019-03-09 DIAGNOSIS — M6281 Muscle weakness (generalized): Secondary | ICD-10-CM | POA: Diagnosis not present

## 2019-03-09 DIAGNOSIS — F22 Delusional disorders: Secondary | ICD-10-CM | POA: Diagnosis not present

## 2019-03-09 DIAGNOSIS — Z9181 History of falling: Secondary | ICD-10-CM | POA: Diagnosis not present

## 2019-03-09 DIAGNOSIS — Z7982 Long term (current) use of aspirin: Secondary | ICD-10-CM | POA: Diagnosis not present

## 2019-03-09 DIAGNOSIS — I10 Essential (primary) hypertension: Secondary | ICD-10-CM | POA: Diagnosis not present

## 2019-03-09 DIAGNOSIS — E785 Hyperlipidemia, unspecified: Secondary | ICD-10-CM | POA: Diagnosis not present

## 2019-03-09 DIAGNOSIS — C9110 Chronic lymphocytic leukemia of B-cell type not having achieved remission: Secondary | ICD-10-CM | POA: Diagnosis not present

## 2019-03-09 LAB — RENAL FUNCTION PANEL
Albumin: 4.1 g/dL (ref 3.6–4.6)
BUN/Creatinine Ratio: 17 (ref 12–28)
BUN: 11 mg/dL (ref 8–27)
CO2: 27 mmol/L (ref 20–29)
Calcium: 9 mg/dL (ref 8.7–10.3)
Chloride: 89 mmol/L — ABNORMAL LOW (ref 96–106)
Creatinine, Ser: 0.63 mg/dL (ref 0.57–1.00)
GFR calc Af Amer: 97 mL/min/{1.73_m2} (ref >59)
GFR calc non Af Amer: 84 mL/min/{1.73_m2} (ref >59)
Glucose: 86 mg/dL (ref 65–99)
Phosphorus: 3.7 mg/dL (ref 3.0–4.3)
Potassium: 3.4 mmol/L — ABNORMAL LOW (ref 3.5–5.2)
Sodium: 131 mmol/L — ABNORMAL LOW (ref 134–144)

## 2019-03-09 NOTE — Telephone Encounter (Signed)
Patient was advised.  

## 2019-03-09 NOTE — Telephone Encounter (Signed)
-----   Message from [name], MD sent at 03/09/2019  2:46 PM EDT ----- Sodium is stable from discharge.  No change to plan

## 2019-03-13 ENCOUNTER — Telehealth: Payer: Self-pay | Admitting: Family Medicine

## 2019-03-13 NOTE — Telephone Encounter (Signed)
Left [name] with [facility]  a message advising her that verbal orders is okay.

## 2019-03-13 NOTE — Telephone Encounter (Signed)
OK for verbals 

## 2019-03-13 NOTE — Telephone Encounter (Signed)
Barbara Chambers with [facility] called needing verbal orders for OT for 1 time a week for one week and then two time for two weeks, one for one week   [phone]  [name]

## 2019-03-14 ENCOUNTER — Telehealth: Payer: Self-pay | Admitting: Family Medicine

## 2019-03-14 NOTE — Telephone Encounter (Signed)
Pt returned a missed call.  Needing a call back.  Thanks, [employer]

## 2019-03-15 ENCOUNTER — Other Ambulatory Visit: Payer: Self-pay | Admitting: Family Medicine

## 2019-03-17 ENCOUNTER — Encounter: Payer: Self-pay | Admitting: Family Medicine

## 2019-03-17 ENCOUNTER — Ambulatory Visit (INDEPENDENT_AMBULATORY_CARE_PROVIDER_SITE_OTHER): Payer: PPO | Admitting: Family Medicine

## 2019-03-17 ENCOUNTER — Other Ambulatory Visit: Payer: Self-pay

## 2019-03-17 VITALS — BP 157/85 | HR 98 | Temp 97.8°F | Wt 127.0 lb

## 2019-03-17 DIAGNOSIS — F4323 Adjustment disorder with mixed anxiety and depressed mood: Secondary | ICD-10-CM | POA: Diagnosis not present

## 2019-03-17 DIAGNOSIS — F22 Delusional disorders: Secondary | ICD-10-CM | POA: Diagnosis not present

## 2019-03-17 DIAGNOSIS — F428 Other obsessive-compulsive disorder: Secondary | ICD-10-CM

## 2019-03-17 DIAGNOSIS — B3731 Acute candidiasis of vulva and vagina: Secondary | ICD-10-CM

## 2019-03-17 DIAGNOSIS — E871 Hypo-osmolality and hyponatremia: Secondary | ICD-10-CM

## 2019-03-17 DIAGNOSIS — Z23 Encounter for immunization: Secondary | ICD-10-CM

## 2019-03-17 DIAGNOSIS — B373 Candidiasis of vulva and vagina: Secondary | ICD-10-CM | POA: Diagnosis not present

## 2019-03-17 DIAGNOSIS — E785 Hyperlipidemia, unspecified: Secondary | ICD-10-CM | POA: Diagnosis not present

## 2019-03-17 DIAGNOSIS — I10 Essential (primary) hypertension: Secondary | ICD-10-CM | POA: Diagnosis not present

## 2019-03-17 MED ORDER — METOPROLOL SUCCINATE ER 25 MG PO TB24: 25.0000 mg | ORAL_TABLET | Freq: Every day | ORAL | 3 refills | Status: DC

## 2019-03-17 MED ORDER — FLUCONAZOLE 150 MG PO TABS: 150.0000 mg | ORAL_TABLET | Freq: Once | ORAL | 0 refills | Status: AC

## 2019-03-17 MED ORDER — SIMVASTATIN 10 MG PO TABS: 10.0000 mg | ORAL_TABLET | Freq: Every day | ORAL | 3 refills | Status: DC

## 2019-03-17 NOTE — Assessment & Plan Note (Signed)
Per patient and her son, she still perseverates some on the cleanliness of her home, but this is improving Continue Cymbalta and has upcoming appointment with psychiatry

## 2019-03-17 NOTE — Assessment & Plan Note (Signed)
Seems to be improving Zoloft was stopped in the setting of hyponatremia She is doing well on Cymbalta, so we will continue 20 mg daily She has upcoming appointments with psychiatry who will likely take control of her medications

## 2019-03-17 NOTE — Patient Instructions (Signed)

## 2019-03-17 NOTE — Assessment & Plan Note (Signed)
Patient seems closer to baseline today after being treated for the UTI and as her sodium normalizes Reassured her again today that she has no new bites or any signs that she has any bugs in her home She seems less bothered by this delusion at this point, but it is still present as she is still showing her son what she thinks are bugs in her home Upcoming psychiatry appointment

## 2019-03-17 NOTE — Assessment & Plan Note (Signed)
Likely related to HCTZ and Zoloft Continue to hold HCTZ Recheck renal function panel

## 2019-03-17 NOTE — Assessment & Plan Note (Signed)
Zocor refilled Reviewed last lipid panel

## 2019-03-17 NOTE — Assessment & Plan Note (Addendum)
Uncontrolled Continue losartan 100mg  daily Avoid amlodipine as she is having more LE swelling since d/c'ing HCTZ Will avoid diuretics at this time given the hypokalemia and hyponatremia Encourage compression socks for LE edema Add metoprolol XL 25mg  daily F/u in 2 wks

## 2019-03-17 NOTE — Progress Notes (Signed)
Patient: Barbara Chambers Female    DOB: 10/14/1934   84 y.o.   MRN: [id number] Visit Date: 03/17/2019  Today's Provider: Lavon Paganini, MD   Chief Complaint  Patient presents with  . Hyponatremia   Subjective:    I, Barbara Chambers CMA, am acting as a scribe for Lavon Paganini, MD.   HPI  Hyponatremia Patient presents today for hyponatremia follow-up. Patient was last office visit was on 03/08/2019. Patient was advised to stop taking her Zoloft due to it may be contributed to her mental status. Patient states that she feels a lot better and states that she can now eat. Patient states she has some lower leg/ ankle swelling.   Spoke to [name].  He reports that she does seem back to herself, but still has some bad days.  Seemed in a panic on Tuesday.  She is not getting as worked up as she used to but she is talking about bugs around the house and on her again.  This seems to be worse when she has not had a good night sleep.   Allergies  Allergen Reactions  . Penicillins Rash     Current Outpatient Medications:  .  aspirin 81 MG chewable tablet, Chew 81 mg by mouth daily., Disp: , Rfl:  .  cefdinir (OMNICEF) 300 MG capsule, Take 1 capsule (300 mg total) by mouth every 12 (twelve) hours., Disp: 10 capsule, Rfl: 0 .  COENZYME Q-10 PO, Take 1 capsule by mouth daily. , Disp: , Rfl:  .  DULoxetine (CYMBALTA) 20 MG capsule, Take 1 capsule (20 mg total) by mouth daily., Disp: 30 capsule, Rfl: 3 .  hydrOXYzine (ATARAX/VISTARIL) 10 MG tablet, Take 10 mg by mouth 3 (three) times daily as needed., Disp: , Rfl:  .  losartan (COZAAR) 100 MG tablet, Take 100 mg by mouth daily., Disp: , Rfl:  .  nystatin ointment (MYCOSTATIN), Apply 1 application topically 2 (two) times daily. (Patient taking differently: Apply 1 application topically 2 (two) times daily. Tapering down), Disp: 30 g, Rfl: 0 .  simvastatin (ZOCOR) 10 MG tablet, Take 1 tablet (10 mg total) by mouth at bedtime., Disp: 90  tablet, Rfl: 3  Review of Systems  Constitutional: Negative.   Respiratory: Negative.   Cardiovascular: Positive for leg swelling.  Genitourinary: Negative.   Neurological: Negative.     Social History   Tobacco Use  . Smoking status: Never Smoker  . Smokeless tobacco: Never Used  Substance Use Topics  . Alcohol use: No     Objective:   BP (!) 157/85 (BP Location: Left Arm, Patient Position: Sitting, Cuff Size: Normal)   Pulse 98   Temp 97.8 F (36.6 C) (Temporal)   Wt 127 lb (57.6 kg)   SpO2 98%   BMI 23.23 kg/m  Vitals:   03/17/19 0837  BP: (!) 157/85  Pulse: 98  Temp: 97.8 F (36.6 C)  TempSrc: Temporal  SpO2: 98%  Weight: 127 lb (57.6 kg)  Body mass index is 23.23 kg/m.   Physical Exam Vitals signs reviewed.  Constitutional:      General: She is not in acute distress.    Appearance: Normal appearance. She is well-developed. She is not diaphoretic.  HENT:     Head: Normocephalic and atraumatic.  Eyes:     General: No scleral icterus.    Conjunctiva/sclera: Conjunctivae normal.  Neck:     Musculoskeletal: Neck supple.     Thyroid: No thyromegaly.  Cardiovascular:  Rate and Rhythm: Normal rate and regular rhythm.     Pulses: Normal pulses.     Heart sounds: Normal heart sounds. No murmur.  Pulmonary:     Effort: Pulmonary effort is normal. No respiratory distress.     Breath sounds: Normal breath sounds. No wheezing, rhonchi or rales.  Musculoskeletal:     Comments: 1+ swelling of bilateral ankles  Lymphadenopathy:     Cervical: No cervical adenopathy.  Skin:    General: Skin is warm and dry.     Capillary Refill: Capillary refill takes less than 2 seconds.     Findings: No rash.     Comments: Mild bruising across lower abdomen  Neurological:     Mental Status: She is alert and oriented to person, place, and time. Mental status is at baseline.  Psychiatric:        Mood and Affect: Mood normal.        Behavior: Behavior normal.      Assessment & Plan   Problem List Items Addressed This Visit      Cardiovascular and Mediastinum   Benign essential HTN    Uncontrolled Continue losartan 100mg  daily Avoid amlodipine as she is having more LE swelling since d/c'ing HCTZ Will avoid diuretics at this time given the hypokalemia and hyponatremia Encourage compression socks for LE edema Add metoprolol XL 25mg  daily F/u in 2 wks      Relevant Medications   metoprolol succinate (TOPROL-XL) 25 MG 24 hr tablet   simvastatin (ZOCOR) 10 MG tablet   Other Relevant Orders   Renal Function Panel     Other   HLD (hyperlipidemia)    Zocor refilled Reviewed last lipid panel      Relevant Medications   metoprolol succinate (TOPROL-XL) 25 MG 24 hr tablet   simvastatin (ZOCOR) 10 MG tablet   Adjustment disorder with mixed anxiety and depressed mood    Seems to be improving Zoloft was stopped in the setting of hyponatremia She is doing well on Cymbalta, so we will continue 20 mg daily She has upcoming appointments with psychiatry who will likely take control of her medications      Obsessional thoughts    Per patient and her son, she still perseverates some on the cleanliness of her home, but this is improving Continue Cymbalta and has upcoming appointment with psychiatry      Delusional disorder ([place])    Patient seems closer to baseline today after being treated for the UTI and as her sodium normalizes Reassured her again today that she has no new bites or any signs that she has any bugs in her home She seems less bothered by this delusion at this point, but it is still present as she is still showing her son what she thinks are bugs in her home Upcoming psychiatry appointment      Hyponatremia - Primary    Likely related to HCTZ and Zoloft Continue to hold HCTZ Recheck renal function panel      Relevant Orders   Renal Function Panel    Other Visit Diagnoses    Vulvar candidiasis       Relevant Medications    fluconazole (DIFLUCAN) 150 MG tablet   Need for influenza vaccination       Relevant Orders   Flu Vaccine QUAD High Dose(Fluad) (Completed)       Return in about 2 weeks (around 03/31/2019) for hyponatremia f/u.   The entirety of the information documented in the History of  Present Illness, Review of Systems and Physical Exam were personally obtained by me. Portions of this information were initially documented by [facility], CMA and reviewed by me for thoroughness and accuracy.    , [name], MD MPH [facility]

## 2019-03-18 LAB — RENAL FUNCTION PANEL
Albumin: 4 g/dL (ref 3.6–4.6)
BUN/Creatinine Ratio: 12 (ref 12–28)
BUN: 8 mg/dL (ref 8–27)
CO2: 25 mmol/L (ref 20–29)
Calcium: 9.1 mg/dL (ref 8.7–10.3)
Chloride: 100 mmol/L (ref 96–106)
Creatinine, Ser: 0.67 mg/dL (ref 0.57–1.00)
GFR calc Af Amer: 95 mL/min/{1.73_m2} (ref >59)
GFR calc non Af Amer: 82 mL/min/{1.73_m2} (ref >59)
Glucose: 104 mg/dL — ABNORMAL HIGH (ref 65–99)
Phosphorus: 3.3 mg/dL (ref 3.0–4.3)
Potassium: 3.4 mmol/L — ABNORMAL LOW (ref 3.5–5.2)
Sodium: 139 mmol/L (ref 134–144)

## 2019-03-20 ENCOUNTER — Telehealth: Payer: Self-pay | Admitting: Family Medicine

## 2019-03-20 ENCOUNTER — Other Ambulatory Visit: Payer: Self-pay

## 2019-03-20 ENCOUNTER — Telehealth: Payer: Self-pay

## 2019-03-20 ENCOUNTER — Ambulatory Visit (HOSPITAL_COMMUNITY): Payer: PPO | Admitting: Licensed Clinical Social Worker

## 2019-03-20 NOTE — Telephone Encounter (Signed)
Can we please make sure that she is taking both Metoprolol 25mg  and Losartan 100mg .  Also see if she is able to recheck her BP and ensure no CP, SOB, vision changes, or signs of stroke.

## 2019-03-20 NOTE — Telephone Encounter (Signed)
-----   Message from [name], MD sent at 03/20/2019  9:29 AM EDT ----- Sodium is back to normal.  Potassium is still slightly low.  Recommend adding some citrus fruit or bananas to her diet.  Let son [name] know results.

## 2019-03-20 NOTE — Telephone Encounter (Signed)
Spoke with patient's [name]) and he said that if pt's BP is up a little when she wakes up she will continue to check throughout the day and becomes very anxious and her BP comes to go up more. Barbara Chambers states that he is making sure that she takes her medication daily and that the pharmacist told him to make sure that patient doesn't take the new BP medication until 3 days after starting a pill that was prescribed to patient.FYI

## 2019-03-20 NOTE — Telephone Encounter (Signed)
Mark advised.   Thanks,   -Mickel Baas

## 2019-03-20 NOTE — Telephone Encounter (Signed)
Barbara Chambers BP is 182/100 per Janett Billow, the nurse with [facility]  Patient said she took her BP med at 7:00 a.m. and is having no other symptoms and say she feels fine.  Please advise.

## 2019-03-21 DIAGNOSIS — C9110 Chronic lymphocytic leukemia of B-cell type not having achieved remission: Secondary | ICD-10-CM | POA: Diagnosis not present

## 2019-03-21 DIAGNOSIS — N390 Urinary tract infection, site not specified: Secondary | ICD-10-CM | POA: Diagnosis not present

## 2019-03-21 DIAGNOSIS — F419 Anxiety disorder, unspecified: Secondary | ICD-10-CM | POA: Diagnosis not present

## 2019-03-21 DIAGNOSIS — Z9181 History of falling: Secondary | ICD-10-CM | POA: Diagnosis not present

## 2019-03-21 DIAGNOSIS — I10 Essential (primary) hypertension: Secondary | ICD-10-CM | POA: Diagnosis not present

## 2019-03-21 DIAGNOSIS — R21 Rash and other nonspecific skin eruption: Secondary | ICD-10-CM | POA: Diagnosis not present

## 2019-03-21 DIAGNOSIS — E785 Hyperlipidemia, unspecified: Secondary | ICD-10-CM | POA: Diagnosis not present

## 2019-03-21 DIAGNOSIS — N3000 Acute cystitis without hematuria: Secondary | ICD-10-CM | POA: Diagnosis not present

## 2019-03-21 DIAGNOSIS — F22 Delusional disorders: Secondary | ICD-10-CM | POA: Diagnosis not present

## 2019-03-21 DIAGNOSIS — M6281 Muscle weakness (generalized): Secondary | ICD-10-CM | POA: Diagnosis not present

## 2019-03-21 DIAGNOSIS — Z7982 Long term (current) use of aspirin: Secondary | ICD-10-CM | POA: Diagnosis not present

## 2019-03-22 ENCOUNTER — Ambulatory Visit: Payer: PPO | Admitting: Adult Health

## 2019-03-23 ENCOUNTER — Telehealth: Payer: Self-pay | Admitting: Family Medicine

## 2019-03-23 ENCOUNTER — Ambulatory Visit (INDEPENDENT_AMBULATORY_CARE_PROVIDER_SITE_OTHER): Payer: PPO | Admitting: *Deleted

## 2019-03-23 ENCOUNTER — Encounter: Payer: Self-pay | Admitting: *Deleted

## 2019-03-23 DIAGNOSIS — R21 Rash and other nonspecific skin eruption: Secondary | ICD-10-CM | POA: Diagnosis not present

## 2019-03-23 DIAGNOSIS — F4323 Adjustment disorder with mixed anxiety and depressed mood: Secondary | ICD-10-CM | POA: Diagnosis not present

## 2019-03-23 DIAGNOSIS — F419 Anxiety disorder, unspecified: Secondary | ICD-10-CM | POA: Diagnosis not present

## 2019-03-23 DIAGNOSIS — Z9181 History of falling: Secondary | ICD-10-CM | POA: Diagnosis not present

## 2019-03-23 DIAGNOSIS — E785 Hyperlipidemia, unspecified: Secondary | ICD-10-CM | POA: Diagnosis not present

## 2019-03-23 DIAGNOSIS — Z7982 Long term (current) use of aspirin: Secondary | ICD-10-CM | POA: Diagnosis not present

## 2019-03-23 DIAGNOSIS — I10 Essential (primary) hypertension: Secondary | ICD-10-CM | POA: Diagnosis not present

## 2019-03-23 DIAGNOSIS — N390 Urinary tract infection, site not specified: Secondary | ICD-10-CM | POA: Diagnosis not present

## 2019-03-23 DIAGNOSIS — N3000 Acute cystitis without hematuria: Secondary | ICD-10-CM | POA: Diagnosis not present

## 2019-03-23 DIAGNOSIS — F428 Other obsessive-compulsive disorder: Secondary | ICD-10-CM

## 2019-03-23 DIAGNOSIS — C9110 Chronic lymphocytic leukemia of B-cell type not having achieved remission: Secondary | ICD-10-CM | POA: Diagnosis not present

## 2019-03-23 DIAGNOSIS — M6281 Muscle weakness (generalized): Secondary | ICD-10-CM | POA: Diagnosis not present

## 2019-03-23 DIAGNOSIS — F22 Delusional disorders: Secondary | ICD-10-CM | POA: Diagnosis not present

## 2019-03-23 NOTE — Progress Notes (Signed)
This encounter was created in error - please disregard.

## 2019-03-23 NOTE — Chronic Care Management (AMB) (Signed)
Chronic Care Management    Clinical Social Work General Note  03/23/2019 Name: Barbara Chambers MRN: [id number] DOB: 1934/11/11  Barbara Chambers is a 84 y.o. year old female who is a primary care patient of [name], [name], MD. The CCM was consulted to assist the patient with [employer]  and [employer] and [employer], caregiver stress and medication adherence.   [name] son [name] on DPR was given information about Chronic Care Management services today including:  1. CCM service includes personalized support from designated clinical staff supervised by her physician, including individualized plan of care and coordination with other care providers 2. 24/7 contact phone numbers for assistance for urgent and routine care needs. 3. Service will only be billed when office clinical staff spend 20 minutes or more in a month to coordinate care. 4. Only one practitioner may furnish and bill the service in a calendar month. 5. The patient may stop CCM services at any time (effective at the end of the month) by phone call to the office staff. 6. The patient will be responsible for cost sharing (co-pay) of up to 20% of the service fee (after annual deductible is met).  [name] agreed to services and verbal consent obtained.   [name] discussed that patient's condition has seemed to improve over the last month. Patient has a scheduled appointment with a psychiatrist at [facility] with [name], ANP-C on Tuesday, 03/28/19. Per patient's son, patient's symptoms began when her niece came to stay with her for the weekend and brought bed bugs. Patient's home was exterminated, however patient remains paranoid and afraid that the bed bugs are still there. Patient's symptoms  have seemed to stabilize but per son, this may be due to patient's efforts to cover up her symptoms. Patient  lives alone, and has a positive support system. She does not like to  leave her home or have people over much due to concerns of bed bugs. [name], however continues to visit daily to ensure that she takes her medications which has worked out well. He also has two aunts and a brother that come to check on patient daily. Due to progress made, patient's son does not feel that patient has any community resource needs at this time but would not mind a call from the [facility] Pharmacist for medication review.  Review of patient status, including review of consultants reports, relevant laboratory and other test results, and collaboration with appropriate care team members and the patient's provider was performed as part of comprehensive patient evaluation and provision of chronic care management services.    SDOH (Social Determinants of Health) screening performed today. See Care Plan Entry related to challenges with: Physical Activity  Advanced Directives Status: <no information> See Care Plan for related entries.   Outpatient Encounter Medications as of 03/23/2019  Medication Sig  . aspirin 81 MG chewable tablet Chew 81 mg by mouth daily.  [name] COENZYME Q-10 PO Take 1 capsule by mouth daily.   . DULoxetine (CYMBALTA) 20 MG capsule Take 1 capsule (20 mg total) by mouth daily.  . hydrOXYzine (ATARAX/VISTARIL) 10 MG tablet Take 10 mg by mouth 3 (three) times daily as needed.  [name] losartan (COZAAR) 100 MG tablet Take 100 mg by mouth daily.  . metoprolol succinate (TOPROL-XL) 25 MG 24 hr tablet Take 1 tablet (25 mg total) by mouth daily.  [name] nystatin ointment (MYCOSTATIN) Apply 1 application topically 2 (two) times daily. (Patient taking differently:  Apply 1 application topically 2 (two) times daily. Tapering down)  . simvastatin (ZOCOR) 10 MG tablet Take 1 tablet (10 mg total) by mouth at bedtime.   No facility-administered encounter medications on file as of 03/23/2019.     Goals Addressed   None      Follow Up Plan: Client will follow up with [facility]    CCM pharmacist  will contact patient's son to schedule a follow up appointment.        Elliot Gurney, [place] Worker  [facility] Care Management [phone]

## 2019-03-23 NOTE — Telephone Encounter (Signed)
Has been d/c'd

## 2019-03-23 NOTE — Telephone Encounter (Signed)
Request from pharmacy for HCTZ.  It is not on her list and I believe it may have been d/c'ed after hospitalization

## 2019-03-23 NOTE — Telephone Encounter (Signed)
[facility] faxed refill request for the following medications:  hydrochlorothiazide (HYDRODIURIL) 25 MG tablet  Please advise.  

## 2019-03-23 NOTE — Patient Instructions (Addendum)
Thank you allowing the Chronic Care Management Team to be a part of your care! It was a pleasure speaking with you today!   Ms. Nygard son Herta Hink on DPR was given information about Chronic Care Management services today including:  1. CCM service includes personalized support from designated clinical staff supervised by her physician, including individualized plan of care and coordination with other care providers 2. 24/7 contact phone numbers for assistance for urgent and routine care needs. 3. Service will only be billed when office clinical staff spend 20 minutes or more in a month to coordinate care. 4. Only one practitioner may furnish and bill the service in a calendar month. 5. The patient may stop CCM services at any time (effective at the end of the month) by phone call to the office staff. 6. The patient will be responsible for cost sharing (co-pay) of up to 20% of the service fee (after annual deductible is met).  CCM (Chronic Care Management) Team     Ruben Reason PharmD  Clinical Pharmacist  [phone]   [place], LCSW Clinical Social Worker [phone]  Goals Addressed            This Visit's Progress   . "I need to make sure mom follows up with a psychiatrist" (pt-stated)       Current Barriers:  [name] Mental Health Concerns   Clinical Social Work Clinical Goal(s):  [name] Over the next 30 days, client will follow up with [facility] as directed by patient's provider to address continued delusion that bed bugs remain in her home  Interventions: . [name] who is on DPR  interviewed and appropriate assessments performed . Provided patient's son with positive reinforcement and benefits of referral to [facility] to address delusional disorder . Confirmed that appointment is scheduled for 03/28/19 with [name] ANP-C for medication assessment . Discussed plans with patient for ongoing care management to follow up on referral  to psychiatry and provided patient with direct contact information for care management team   Patient Self Care Activities:  . Attends all scheduled provider appointments . Performs ADL's independently . Unable to perform IADLs independently  Initial goal documentation         The patient verbalized understanding of instructions provided today and declined a print copy of patient instruction materials.   No further follow up required: Patient will follow up with [facility] on 03/28/19

## 2019-03-27 ENCOUNTER — Telehealth: Payer: Self-pay | Admitting: Family Medicine

## 2019-03-27 NOTE — Telephone Encounter (Signed)
Barbara Chambers with [facility] home health called saying patients blood pressure today is 160/60 sitting..  Please advise [phone]  [name]

## 2019-03-27 NOTE — Telephone Encounter (Signed)
Will follow-up at next in person visit as scheduled.

## 2019-03-28 ENCOUNTER — Other Ambulatory Visit: Payer: Self-pay

## 2019-03-28 ENCOUNTER — Ambulatory Visit (INDEPENDENT_AMBULATORY_CARE_PROVIDER_SITE_OTHER): Payer: PPO | Admitting: Adult Health

## 2019-03-28 ENCOUNTER — Encounter: Payer: Self-pay | Admitting: Adult Health

## 2019-03-28 VITALS — BP 173/83 | HR 83 | Ht 63.0 in | Wt 122.0 lb

## 2019-03-28 DIAGNOSIS — B89 Unspecified parasitic disease: Secondary | ICD-10-CM | POA: Diagnosis not present

## 2019-03-28 DIAGNOSIS — F331 Major depressive disorder, recurrent, moderate: Secondary | ICD-10-CM | POA: Diagnosis not present

## 2019-03-28 DIAGNOSIS — F411 Generalized anxiety disorder: Secondary | ICD-10-CM

## 2019-03-28 DIAGNOSIS — G4700 Insomnia, unspecified: Secondary | ICD-10-CM

## 2019-03-28 NOTE — Progress Notes (Signed)
[facility] MD/PA/NP Initial Note  03/29/2019 1:27 PM Barbara Chambers  MRN:  [id number]  Chief Complaint:  Chief Complaint    Other; Anxiety; Depression; Insomnia      HPI:   Describes mood today as "ok". Pleasant. Smiling. Mood symptoms - reports depression, anxiety, and irritability (son said she was very depressed in July - like a new person now). Change of behaviors started in May. Bed bugs got into her house. Had to have home exterminated. Son notes "that was the straw that broke the camel's back". Has continued to see bugs and feels they are in her home. Notes they are biting her and have gotten under her skin - pointing to a bruise on her arm. Putting clothes in toilet too see if they will float up. Has been checked for UTI. Was hospitalized with UTI and low sodium. Collects lint and grass and says they are bugs. Son says she is a 1000 times better. Did not want to let anyone in her home after infestation, but now everyone can come in. Son feels like she has started talking more about the bugs in the last 4 to 5 days. Stable interest and motivation. Taking medications as prescribed.  Energy levels stable. Active, does not have a regular exercise routine. Tries to walk 2 miles a day before May. Walking around indoors - to the mailbox.  Enjoys some usual interests and activities. Lives alone. Going to church. Going out to eat. Spending time with family - son visiting twice a day. Home health nurse visiting daily  Appetite adequate. Weight loss 7 pounds. Eating better.  Sleeps well most nights. Averages 6.5 hours. Napping during the day some days. Was waking up a lot previously.  Focus and concentration difficulties at times. Completing tasks. Managing aspects of household. Taking medications as prescribed.  Denies SI or HI. Denies AH or VH.  Previous medications: Zoloft  Visit Diagnosis:    ICD-10-CM   1. Insomnia, unspecified type  G47.00   2. Generalized anxiety disorder  F41.1   3.  Major depressive disorder, recurrent episode, moderate (HCC)  F33.1   4. Parasitosis  B89     Past Psychiatric History: Denies  Past Medical History:  Past Medical History:  Diagnosis Date  . Cancer [facility]) 2011   lymphoma  . Hyperlipidemia   . Hypertension     Past Surgical History:  Procedure Laterality Date  . cataract surgery Bilateral 2005  . CHOLECYSTECTOMY  1991  . RECONSTRUCTION OF EYELID      Family Psychiatric History: Denies   Family History:  Family History  Problem Relation Age of Onset  . Hypertension Mother   . Stroke Mother   . CAD Mother   . Hypertension Father   . Heart attack Father   . Hypertension Brother   . Seizures Brother   . Hypertension Son   . Breast cancer Neg Hx     Social History:  Social History   Socioeconomic History  . Marital status: Widowed    Spouse name: Casandra Doffing  . Number of children: 3  . Years of education: Not on file  . Highest education level: 12th grade  Occupational History  . Occupation: retired  Scientific laboratory technician  . Financial resource strain: Not hard at all  . Food insecurity    Worry: Never true    Inability: Never true  . Transportation needs    Medical: No    Non-medical: No  Tobacco Use  . Smoking status: Never Smoker  .  Smokeless tobacco: Never Used  Substance and Sexual Activity  . Alcohol use: No  . Drug use: No  . Sexual activity: Not on file  Lifestyle  . Physical activity    Days per week: 0 days    Minutes per session: 0 min  . Stress: Only a little  Relationships  . Social connections    Talks on phone: More than three times a week    Gets together: More than three times a week    Attends religious service: 1 to 4 times per year    Active member of club or organization: No    Attends meetings of clubs or organizations: Never    Relationship status: Widowed  Other Topics Concern  . Not on file  Social History Narrative   Pt has 1 son that was killed in a car accident at age 83.      Allergies:  Allergies  Allergen Reactions  . Hctz [Hydrochlorothiazide]     Hyponatremia  . Penicillins Rash    Metabolic Disorder Labs: No results found for: HGBA1C, MPG No results found for: PROLACTIN Lab Results  Component Value Date   CHOL 190 07/11/2018   TRIG 102 07/11/2018   HDL 58 07/11/2018   CHOLHDL 3.3 07/11/2018   LDLCALC 112 (H) 07/11/2018   LDLCALC 100 (H) 07/08/2017   Lab Results  Component Value Date   TSH 3.790 03/01/2019   TSH 5.300 (H) 07/11/2018    Therapeutic Level Labs: No results found for: LITHIUM No results found for: VALPROATE No components found for:  CBMZ  Current Medications: Current Outpatient Medications  Medication Sig Dispense Refill  . aspirin 81 MG chewable tablet Chew 81 mg by mouth daily.    [name] COENZYME Q-10 PO Take 1 capsule by mouth daily.     . DULoxetine (CYMBALTA) 20 MG capsule Take 1 capsule (20 mg total) by mouth daily. 30 capsule 3  . fluconazole (DIFLUCAN) 150 MG tablet TAKE 1 TABLET BY MOUTH AS ONE DOSE    . hydrOXYzine (ATARAX/VISTARIL) 10 MG tablet Take 10 mg by mouth 3 (three) times daily as needed.    [name] losartan (COZAAR) 100 MG tablet Take 100 mg by mouth daily.    . metoprolol succinate (TOPROL-XL) 25 MG 24 hr tablet Take 1 tablet (25 mg total) by mouth daily. 30 tablet 3  . nystatin ointment (MYCOSTATIN) Apply 1 application topically 2 (two) times daily. (Patient taking differently: Apply 1 application topically 2 (two) times daily. Tapering down) 30 g 0  . simvastatin (ZOCOR) 10 MG tablet Take 1 tablet (10 mg total) by mouth at bedtime. 90 tablet 3   No current facility-administered medications for this visit.     Medication Side Effects: none  Orders placed this visit:  No orders of the defined types were placed in this encounter.   Psychiatric Specialty Exam:  ROS  Blood pressure (!) 173/83, pulse 83, height 5\' 3"  (1.6 m), weight 122 lb (55.3 kg).Body mass index is 21.61 kg/m.  General Appearance:  Neat and Well Groomed  Eye Contact:  Good  Speech:  Clear and Coherent  Volume:  Normal  Mood:  Euthymic  Affect:  Appropriate  Thought Process:  Coherent  Orientation:  Full (Time, Place, and Person)  Thought Content: Logical   Suicidal Thoughts:  No  Homicidal Thoughts:  No  Memory:  WNL  Judgement:  Good  Insight:  Good  Psychomotor Activity:  Normal  Concentration:  Concentration: Good  Recall:  Good  Fund of Knowledge: Good  Language: Good  Assets:  Communication Skills Desire for Improvement Financial Resources/Insurance Housing Intimacy Leisure Time Physical Health Resilience Social Support Talents/Skills Transportation Vocational/Educational  ADL's:  Intact  Cognition: WNL  Prognosis:  Good   Screenings:  PHQ2-9     Clinical Support from 07/11/2018 in [place] from 07/08/2017 in [place] from 06/24/2016 in [place] Visit from 12/26/2014 in [place]  PHQ-2 Total Score  0  0  1  0  PHQ-9 Total Score  -  0  -  -      Receiving Psychotherapy: No   Treatment Plan/Recommendations:   1. Will have son take for recheck of UTI since symptoms worsening  Plan to add Zyprexa 2.5mg  at hs if UTI negative.   RTC 4 weeks  Discussed potential metabolic side effects associated with atypical antipsychotics, as well as potential risk for movement side effects. Advised pt to contact office if movement side effects occur.    Aloha Gell, NP

## 2019-03-29 ENCOUNTER — Ambulatory Visit: Payer: Self-pay | Admitting: *Deleted

## 2019-03-29 ENCOUNTER — Telehealth: Payer: Self-pay | Admitting: Family Medicine

## 2019-03-29 ENCOUNTER — Encounter: Payer: Self-pay | Admitting: *Deleted

## 2019-03-29 DIAGNOSIS — F428 Other obsessive-compulsive disorder: Secondary | ICD-10-CM

## 2019-03-29 DIAGNOSIS — L299 Pruritus, unspecified: Secondary | ICD-10-CM

## 2019-03-29 NOTE — Chronic Care Management (AMB) (Signed)
  Chronic Care Management   Initial Visit Note  03/29/2019 Name: Barbara Chambers MRN: [id number] DOB: [birth year]  Referred by: [name], MD Reason for referral : Chronic Care Management (mental health counseling and resources)   Barbara Chambers is a 84 y.o. year old female who is a primary care patient of [name], [name], MD. The CCM team was consulted for assistance with chronic disease management and care coordination needs.   Review of patient status, including review of consultants reports, relevant laboratory and other test results, and collaboration with appropriate care team members and the patient's provider was performed as part of comprehensive patient evaluation and provision of chronic care management services.    Objective:   Goals Addressed            This Visit's Progress   . "I need to make sure mom follows up with a psychiatrist" (pt-stated)       Current Barriers:  [name] Mental Health Concerns   Clinical Social Work Clinical Goal(s):  [name] Over the next 30 days, client will follow up with [facility] as directed by patient's provider to address continued delusion that bed bugs remain in her home  Interventions: . [name] who is on DPR  interviewed and appropriate assessments performed . Provided patient's son with positive reinforcement and benefits of referral to [facility] to address delusional disorder . Confirmed that appointment is scheduled for 03/28/19 with [name] ANP-C for medication assessment . Discussed plans with patient for ongoing care management to follow up on referral to psychiatry and provided patient with direct contact information for care management team   Patient Self Care Activities:  . Attends all scheduled provider appointments . Performs ADL's independently . Unable to perform IADLs independently  Initial goal documentation         Plan:  This Education officer, museum will reach out to patient and  patient's son to follow up on any mental health needs following appointment scheduled with the psychiatrist at [facility] psychiatric    [place], [place] Social Worker  [facility] Care Management [phone]

## 2019-03-29 NOTE — Chronic Care Management (AMB) (Signed)
Chronic Care Management   Follow Up Note   03/29/2019 Name: Barbara Chambers MRN: [id number] DOB: Jun 12, 1935  Referred by: [name], MD Reason for referral : Chronic Care Management (Initial RNCM Visit)   Barbara Chambers is a 84 y.o. year old female who is a primary care patient of [name], [name], MD. The CCM team was consulted for assistance with chronic disease management and care coordination needs.    Review of patient status, including review of consultants reports, relevant laboratory and other test results, and collaboration with appropriate care team members and the patient's provider was performed as part of comprehensive patient evaluation and provision of chronic care management services.    SDOH (Social Determinants of Health) screening performed today: Social Connections. See Care Plan for related entries.   Advanced Directives Status: N See Care Plan and [employer] application for related entries.  Outpatient Encounter Medications as of 03/29/2019  Medication Sig  . aspirin 81 MG chewable tablet Chew 81 mg by mouth daily.  [name] COENZYME Q-10 PO Take 1 capsule by mouth daily.   . DULoxetine (CYMBALTA) 20 MG capsule Take 1 capsule (20 mg total) by mouth daily.  . fluconazole (DIFLUCAN) 150 MG tablet TAKE 1 TABLET BY MOUTH AS ONE DOSE  . hydrOXYzine (ATARAX/VISTARIL) 10 MG tablet Take 10 mg by mouth 3 (three) times daily as needed.  [name] losartan (COZAAR) 100 MG tablet Take 100 mg by mouth daily.  . metoprolol succinate (TOPROL-XL) 25 MG 24 hr tablet Take 1 tablet (25 mg total) by mouth daily.  [name] nystatin ointment (MYCOSTATIN) Apply 1 application topically 2 (two) times daily. (Patient taking differently: Apply 1 application topically 2 (two) times daily. Tapering down)  . simvastatin (ZOCOR) 10 MG tablet Take 1 tablet (10 mg total) by mouth at bedtime.   No facility-administered encounter medications on file as of 03/29/2019.      Goals Addressed            This  Visit's Progress   . "We need to make sure she doesn't have a urinary tract infection" (pt-stated)       Current Barriers:  . Chronic Disease Management support and education needs related to chronic intermittent UTI and current mental health management needs ; patient's son indicates that psychiatry team requested that he speak with primary care provider to ensure that she DOES NOT feel the patient's current symptoms are related to possible UTI  Nurse Case Manager Clinical Goal(s):  [name] Over the next 7 days, patient will verbalize understanding of plan for ruling out UTI as source of current symptoms . Over the next 30 days, patient will work with LCSW to address needs related to mental health conditions and care coordination needs  Interventions:  . Evaluation of current treatment plan related to current symptoms and management of mental health concerns and patient's adherence to plan as established by provider. Nash Dimmer with PCP via secure message regarding symptoms and rule out UTI . Discussed plans with patient for ongoing care management follow up and provided patient with direct contact information for care management team;   Patient Self Care Activities:  . Unable to independently self manage current vulvar irritation and symptoms   Initial goal documentation        PLAN: Son agreed to speak with front desk scheduler to plan visit for follow up and rule out UTI.  The care management team will reach out to the patient again over the next 14 days.  [facility] Care Management [phone]

## 2019-03-29 NOTE — Patient Instructions (Signed)
Visit Information  Goals Addressed            This Visit's Progress   . "We need to make sure she doesn't have a urinary tract infection" (pt-stated)       Current Barriers:  . Chronic Disease Management support and education needs related to chronic intermittent UTI and current mental health management needs ; patient's son indicates that psychiatry team requested that he speak with primary care provider to ensure that she DOES NOT feel the patient's current symptoms are related to possible UTI before starting new medication for management of delusions  Nurse Case Manager Clinical Goal(s):  [name] Over the next 7 days, patient will verbalize understanding of plan for ruling out UTI as source of current symptoms prior to starting medication to manage delusions . Over the next 30 days, patient will work with LCSW to address needs related to mental health conditions and care coordination needs  Interventions:  . Evaluation of current treatment plan related to current symptoms and management of mental health concerns and patient's adherence to plan as established by provider. Nash Dimmer with PCP via secure message regarding symptoms and rule out UTI . Requested assistance from front office staff to set up appointment for evaluation for UTI . Discussed plans with patient for ongoing care management follow up and provided patient with direct contact information for care management team  Patient Self Care Activities:  . Unable to independently self manage current vulvar irritation and symptoms   Initial goal documentation        The patient verbalized understanding of instructions provided today and declined a print copy of patient instruction materials.   The care management team will reach out to the patient again over the next 14 days.   The patient's son with work with front desk scheduler to set up appointment for follow up/evaluation to rule out UTI.   [facility] Care Management [phone]

## 2019-03-29 NOTE — Telephone Encounter (Signed)
Pt's son called saying he had a visit with [facility] and they ask him to contact his moms primary doctor to go over some information  CB#  [phone]  [name]

## 2019-03-29 NOTE — Telephone Encounter (Signed)
Patient son states that appt is scheduled in AM to further discuss. KW

## 2019-03-30 ENCOUNTER — Ambulatory Visit (INDEPENDENT_AMBULATORY_CARE_PROVIDER_SITE_OTHER): Payer: PPO | Admitting: Family Medicine

## 2019-03-30 ENCOUNTER — Encounter: Payer: Self-pay | Admitting: Family Medicine

## 2019-03-30 ENCOUNTER — Other Ambulatory Visit: Payer: Self-pay | Admitting: Family Medicine

## 2019-03-30 ENCOUNTER — Other Ambulatory Visit: Payer: Self-pay

## 2019-03-30 VITALS — BP 159/88 | HR 89 | Temp 97.8°F | Wt 126.0 lb

## 2019-03-30 DIAGNOSIS — R3989 Other symptoms and signs involving the genitourinary system: Secondary | ICD-10-CM

## 2019-03-30 DIAGNOSIS — B373 Candidiasis of vulva and vagina: Secondary | ICD-10-CM | POA: Diagnosis not present

## 2019-03-30 DIAGNOSIS — B3731 Acute candidiasis of vulva and vagina: Secondary | ICD-10-CM

## 2019-03-30 DIAGNOSIS — I10 Essential (primary) hypertension: Secondary | ICD-10-CM | POA: Diagnosis not present

## 2019-03-30 LAB — POCT URINALYSIS DIPSTICK
Bilirubin, UA: NEGATIVE
Glucose, UA: NEGATIVE
Nitrite, UA: NEGATIVE
Protein, UA: NEGATIVE
Spec Grav, UA: 1.015 (ref 1.010–1.025)
Urobilinogen, UA: 0.2 E.U./dL
pH, UA: 6.5 (ref 5.0–8.0)

## 2019-03-30 MED ORDER — NYSTATIN 100000 UNIT/GM EX OINT: 1.0000 "application " | TOPICAL_OINTMENT | Freq: Two times a day (BID) | CUTANEOUS | 0 refills | Status: DC

## 2019-03-30 MED ORDER — SULFAMETHOXAZOLE-TRIMETHOPRIM 800-160 MG PO TABS: 1.0000 | ORAL_TABLET | Freq: Two times a day (BID) | ORAL | 0 refills | Status: AC

## 2019-03-30 MED ORDER — METOPROLOL SUCCINATE ER 50 MG PO TB24: 50.0000 mg | ORAL_TABLET | Freq: Every day | ORAL | 1 refills | Status: DC

## 2019-03-30 NOTE — Telephone Encounter (Signed)
L.O.V. was today 03/30/2019. Is requesting a 90 day supply for medication. Please advise.

## 2019-03-30 NOTE — Telephone Encounter (Signed)
[name] Nadara Mustard) was advised.

## 2019-03-30 NOTE — Patient Instructions (Signed)
Increase BP med (Metoprolol) to 50mg  daily (ok to take 2 of the 25mg  pills together until you run out)  We are treating for UTI, but sending culture to confirm. Take Bactrim twice daily (antibiotic) for 7 days  Use nystatin cream twice daily for yeast irritation of external genitals for 7-10 days  Follow-up in 1 month

## 2019-03-30 NOTE — Progress Notes (Signed)
Patient: Barbara Chambers Female    DOB: 1934/10/06   84 y.o.   MRN: [id number] Visit Date: 03/30/2019  Today's Provider: Lavon Paganini, MD   Chief Complaint  Patient presents with  . Dysuria   Subjective:    I, [name] CMA, am acting as a scribe for Lavon Paganini, MD.  Dysuria  The current episode started 1 to 4 weeks ago. The problem has been gradually worsening. The quality of the pain is described as burning. The pain is at a severity of 3/10. The pain is mild. There has been no fever. Associated symptoms include urgency. Pertinent negatives include no discharge or nausea.   She was recently hospitalized for altered mental status thought to be secondary to UTI, though altered mental status had been going on longer than the UTI.  She was treated with cefdinir after hospitalization.  Urine culture resulted at that time showed multiple species and no sensitivities were performed  She states that symptoms did improve, but have worsened again.  She is experiencing dysuria and urinary frequency.  She states that there is also irritation of her vulva.  She has not tried any medications for this.  She denies any vaginal discharge.    Overall her mental state has been improving.  She recently saw psychiatry at [facility] who plan to start Zyprexa if we can rule out that her symptoms are not all caused by UTI.  She does still believe that there are bugs living under her skin, however  She is also concerned and anxious because her blood pressure continues to elevate at home.  She was started on metoprolol a few weeks ago which she states she is taking with good compliance.  Allergies  Allergen Reactions  . Hctz [Hydrochlorothiazide]     Hyponatremia  . Penicillins Rash     Current Outpatient Medications:  .  aspirin 81 MG chewable tablet, Chew 81 mg by mouth daily., Disp: , Rfl:  .  COENZYME Q-10 PO, Take 1 capsule by mouth daily. , Disp: , Rfl:  .  DULoxetine  (CYMBALTA) 20 MG capsule, Take 1 capsule (20 mg total) by mouth daily., Disp: 30 capsule, Rfl: 3 .  hydrOXYzine (ATARAX/VISTARIL) 10 MG tablet, Take 10 mg by mouth 3 (three) times daily as needed., Disp: , Rfl:  .  losartan (COZAAR) 100 MG tablet, Take 100 mg by mouth daily., Disp: , Rfl:  .  metoprolol succinate (TOPROL-XL) 25 MG 24 hr tablet, Take 1 tablet (25 mg total) by mouth daily., Disp: 30 tablet, Rfl: 3 .  nystatin ointment (MYCOSTATIN), Apply 1 application topically 2 (two) times daily. (Patient taking differently: Apply 1 application topically 2 (two) times daily. Tapering down), Disp: 30 g, Rfl: 0 .  simvastatin (ZOCOR) 10 MG tablet, Take 1 tablet (10 mg total) by mouth at bedtime., Disp: 90 tablet, Rfl: 3 .  fluconazole (DIFLUCAN) 150 MG tablet, TAKE 1 TABLET BY MOUTH AS ONE DOSE, Disp: , Rfl:   Review of Systems  Constitutional: Negative.   Respiratory: Negative.   Cardiovascular: Negative.   Gastrointestinal: Negative for nausea.  Genitourinary: Positive for dysuria and urgency.  Musculoskeletal: Negative.     Social History   Tobacco Use  . Smoking status: Never Smoker  . Smokeless tobacco: Never Used  Substance Use Topics  . Alcohol use: No      Objective:   BP (!) 159/88 (BP Location: Right Arm, Patient Position: Sitting, Cuff Size: Normal)   Pulse 89  Temp 97.8 F (36.6 C) (Temporal)   Wt 126 lb (57.2 kg)   SpO2 96%   BMI 22.32 kg/m  Vitals:   03/30/19 0833  BP: (!) 159/88  Pulse: 89  Temp: 97.8 F (36.6 C)  TempSrc: Temporal  SpO2: 96%  Weight: 126 lb (57.2 kg)  Body mass index is 22.32 kg/m.   Physical Exam Constitutional:      General: She is not in acute distress.    Appearance: Normal appearance. She is not diaphoretic.  HENT:     Head: Normocephalic and atraumatic.  Eyes:     General: No scleral icterus.    Conjunctiva/sclera: Conjunctivae normal.  Neck:     Musculoskeletal: Neck supple.  Cardiovascular:     Rate and Rhythm:  Normal rate and regular rhythm.     Pulses: Normal pulses.     Heart sounds: Normal heart sounds. No murmur.  Pulmonary:     Effort: Pulmonary effort is normal. No respiratory distress.     Breath sounds: Normal breath sounds. No wheezing.  Abdominal:     General: There is no distension.     Palpations: Abdomen is soft.     Tenderness: There is no abdominal tenderness. There is no right CVA tenderness, left CVA tenderness, guarding or rebound.  Genitourinary:    Pubic Area: Rash (erythematous rash in inguinal creases primarily) present.  Musculoskeletal:     Right lower leg: No edema.     Left lower leg: No edema.  Lymphadenopathy:     Cervical: No cervical adenopathy.  Skin:    General: Skin is warm and dry.     Capillary Refill: Capillary refill takes less than 2 seconds.     Comments: +senile purpura  Neurological:     Mental Status: She is alert and oriented to person, place, and time.  Psychiatric:        Mood and Affect: Affect normal. Mood is anxious.        Speech: Speech normal.        Behavior: Behavior is cooperative.        Thought Content: Thought content does not include homicidal or suicidal ideation.      Results for orders placed or performed in visit on 03/30/19  POCT urinalysis dipstick  Result Value Ref Range   Color, UA Golden Yellow    Clarity, UA Clear    Glucose, UA Negative Negative   Bilirubin, UA Negative    Ketones, UA Neagtive    Spec Grav, UA 1.015 1.010 - 1.025   Blood, UA Trace    pH, UA 6.5 5.0 - 8.0   Protein, UA Negative Negative   Urobilinogen, UA 0.2 0.2 or 1.0 E.U./dL   Nitrite, UA Negative    Leukocytes, UA Moderate (2+) (A) Negative   Appearance     Odor         Assessment & Plan   1. Possible urinary tract infection - Symptoms and UA consistent with UTI -No systemic symptoms or signs of pyelonephritis - Given hematuria, will send urine micro to confirm and will plan to recheck urine in about 6 weeks after completion of  antibiotics to ensure hematuria has cleared -Will start treatment with 7day course of Bactrim -We will send urine culture to confirm sensitivities -Discussed return precautions  - of note, I do believe that her delusions and obsessive tendencies started prior to the UTI and she should f/u with Psych regarding these after treating UTI - Urine Culture - Urine  Microscopic  2. Benign essential HTN - uncontrolled today -We will increase metoprolol dose to 50 mg daily -Continue losartan at current dose -Follow-up in 1 month - metoprolol succinate (TOPROL-XL) 50 MG 24 hr tablet; Take 1 tablet (50 mg total) by mouth daily.  Dispense: 30 tablet; Refill: 1  3. Vulvar candidiasis -Mild -Treat with nystatin ointment twice daily for 7 days -May contribute to her dysuria   Meds ordered this encounter  Medications  . metoprolol succinate (TOPROL-XL) 50 MG 24 hr tablet    Sig: Take 1 tablet (50 mg total) by mouth daily.    Dispense:  30 tablet    Refill:  1  . sulfamethoxazole-trimethoprim (BACTRIM DS) 800-160 MG tablet    Sig: Take 1 tablet by mouth 2 (two) times daily for 7 days.    Dispense:  14 tablet    Refill:  0  . nystatin ointment (MYCOSTATIN)    Sig: Apply 1 application topically 2 (two) times daily.    Dispense:  30 g    Refill:  0     Return in about 4 weeks (around 04/27/2019) for BP f/u.   The entirety of the information documented in the History of Present Illness, Review of Systems and Physical Exam were personally obtained by me. Portions of this information were initially documented by [facility], CMA and reviewed by me for thoroughness and accuracy.    , [name], MD MPH [facility]

## 2019-03-31 DIAGNOSIS — R21 Rash and other nonspecific skin eruption: Secondary | ICD-10-CM | POA: Diagnosis not present

## 2019-03-31 DIAGNOSIS — N390 Urinary tract infection, site not specified: Secondary | ICD-10-CM | POA: Diagnosis not present

## 2019-03-31 DIAGNOSIS — C9110 Chronic lymphocytic leukemia of B-cell type not having achieved remission: Secondary | ICD-10-CM | POA: Diagnosis not present

## 2019-03-31 DIAGNOSIS — I10 Essential (primary) hypertension: Secondary | ICD-10-CM | POA: Diagnosis not present

## 2019-03-31 DIAGNOSIS — E785 Hyperlipidemia, unspecified: Secondary | ICD-10-CM | POA: Diagnosis not present

## 2019-03-31 DIAGNOSIS — Z9181 History of falling: Secondary | ICD-10-CM | POA: Diagnosis not present

## 2019-03-31 DIAGNOSIS — F22 Delusional disorders: Secondary | ICD-10-CM | POA: Diagnosis not present

## 2019-03-31 DIAGNOSIS — Z7982 Long term (current) use of aspirin: Secondary | ICD-10-CM | POA: Diagnosis not present

## 2019-03-31 DIAGNOSIS — N3000 Acute cystitis without hematuria: Secondary | ICD-10-CM | POA: Diagnosis not present

## 2019-03-31 DIAGNOSIS — M6281 Muscle weakness (generalized): Secondary | ICD-10-CM | POA: Diagnosis not present

## 2019-03-31 DIAGNOSIS — F419 Anxiety disorder, unspecified: Secondary | ICD-10-CM | POA: Diagnosis not present

## 2019-04-01 LAB — URINE CULTURE: Organism ID, Bacteria: NO GROWTH

## 2019-04-01 LAB — URINALYSIS, MICROSCOPIC ONLY: Casts: NONE SEEN /lpf

## 2019-04-03 ENCOUNTER — Other Ambulatory Visit: Payer: Self-pay | Admitting: Adult Health

## 2019-04-03 ENCOUNTER — Ambulatory Visit: Payer: PPO | Admitting: *Deleted

## 2019-04-03 ENCOUNTER — Telehealth: Payer: Self-pay

## 2019-04-03 DIAGNOSIS — F428 Other obsessive-compulsive disorder: Secondary | ICD-10-CM

## 2019-04-03 DIAGNOSIS — F22 Delusional disorders: Secondary | ICD-10-CM | POA: Diagnosis not present

## 2019-04-03 DIAGNOSIS — R21 Rash and other nonspecific skin eruption: Secondary | ICD-10-CM | POA: Diagnosis not present

## 2019-04-03 DIAGNOSIS — M6281 Muscle weakness (generalized): Secondary | ICD-10-CM | POA: Diagnosis not present

## 2019-04-03 DIAGNOSIS — Z9181 History of falling: Secondary | ICD-10-CM | POA: Diagnosis not present

## 2019-04-03 DIAGNOSIS — B89 Unspecified parasitic disease: Secondary | ICD-10-CM

## 2019-04-03 DIAGNOSIS — I10 Essential (primary) hypertension: Secondary | ICD-10-CM | POA: Diagnosis not present

## 2019-04-03 DIAGNOSIS — F419 Anxiety disorder, unspecified: Secondary | ICD-10-CM | POA: Diagnosis not present

## 2019-04-03 DIAGNOSIS — Z7982 Long term (current) use of aspirin: Secondary | ICD-10-CM | POA: Diagnosis not present

## 2019-04-03 DIAGNOSIS — N3000 Acute cystitis without hematuria: Secondary | ICD-10-CM | POA: Diagnosis not present

## 2019-04-03 DIAGNOSIS — E785 Hyperlipidemia, unspecified: Secondary | ICD-10-CM | POA: Diagnosis not present

## 2019-04-03 DIAGNOSIS — C9110 Chronic lymphocytic leukemia of B-cell type not having achieved remission: Secondary | ICD-10-CM | POA: Diagnosis not present

## 2019-04-03 DIAGNOSIS — N390 Urinary tract infection, site not specified: Secondary | ICD-10-CM | POA: Diagnosis not present

## 2019-04-03 MED ORDER — OLANZAPINE 2.5 MG PO TABS: 2.5000 mg | ORAL_TABLET | Freq: Every day | ORAL | 2 refills | Status: DC

## 2019-04-03 NOTE — Telephone Encounter (Signed)
-----   Message from [name], MD sent at 04/03/2019 10:06 AM EDT ----- Urine culture and micro with no true UTI.  Can stop abx. F/u with Psych regarding delusions as this is not caused by UTI.

## 2019-04-03 NOTE — Telephone Encounter (Signed)
Tried calling pt and no answer or voicemail set up. Will try pt later.

## 2019-04-03 NOTE — Telephone Encounter (Signed)
Spoke with son, [name] and given information along with side effects, advised to call back with further questions or concerns.

## 2019-04-03 NOTE — Telephone Encounter (Signed)
Pts son called back about last office visit.  Lucill saw the doc last week and she is cleared from having a UTI. He would like to have you send in her RX for the antipsychotic to [place], in [place].

## 2019-04-04 NOTE — Chronic Care Management (AMB) (Signed)
  Chronic Care Management    Clinical Social Work Follow Up Note  04/04/2019 Name: Barbara Chambers MRN: [id number] DOB: August 08, 1934  Barbara Chambers is a 84 y.o. year old female who is a primary care patient of [name], [name], MD. The CCM team was consulted for assistance with Mental Health Counseling and Resources.   Review of patient status, including review of consultants reports, other relevant assessments, and collaboration with appropriate care team members and the patient's provider was performed as part of comprehensive patient evaluation and provision of chronic care management services.    Advanced Directives Status: <no information> See Care Plan for related entries.   Outpatient Encounter Medications as of 04/03/2019  Medication Sig  . aspirin 81 MG chewable tablet Chew 81 mg by mouth daily.  [name] COENZYME Q-10 PO Take 1 capsule by mouth daily.   . DULoxetine (CYMBALTA) 20 MG capsule TAKE 1 CAPSULE BY MOUTH EVERY DAY  . fluconazole (DIFLUCAN) 150 MG tablet TAKE 1 TABLET BY MOUTH AS ONE DOSE  . hydrOXYzine (ATARAX/VISTARIL) 10 MG tablet Take 10 mg by mouth 3 (three) times daily as needed.  [name] losartan (COZAAR) 100 MG tablet Take 100 mg by mouth daily.  . metoprolol succinate (TOPROL-XL) 50 MG 24 hr tablet Take 1 tablet (50 mg total) by mouth daily.  [name] nystatin ointment (MYCOSTATIN) Apply 1 application topically 2 (two) times daily.  [name] OLANZapine (ZYPREXA) 2.5 MG tablet Take 1 tablet (2.5 mg total) by mouth at bedtime.  . simvastatin (ZOCOR) 10 MG tablet Take 1 tablet (10 mg total) by mouth at bedtime.  . sulfamethoxazole-trimethoprim (BACTRIM DS) 800-160 MG tablet Take 1 tablet by mouth 2 (two) times daily for 7 days.   No facility-administered encounter medications on file as of 04/03/2019.      Goals Addressed            This Visit's Progress   . "I need to make sure mom follows up with a psychiatrist" (pt-stated)       Current Barriers:  [name] Mental Health Concerns    Clinical Social Work Clinical Goal(s):  [name] Over the next 30 days, client will follow up with [facility] as directed by patient's provider to address continued delusion that bed bugs remain in her home  Interventions: . [name] who is on DPR  interviewed and appropriate assessments performed . Followed up on  referral to [facility] to address delusional disorder . Confirmed that patient appointment scheduled for 03/28/19 with [name] ANP-C for medication assessment was kept . Patient's EMR confirmed that patient does not have a UTI and she can now stop the antibiotics . Encouraged patient's son to continue to support patient with ongoing mental health follow up with [facility] for medication management. . [name] provided  with direct contact information for this social worker in the event that any community resource needs arise   Patient Self Care Activities:  . Attends all scheduled provider appointments . Performs ADL's independently . Unable to perform IADLs independently  Please see past updates related to this goal by clicking on the "Past Updates" button in the selected goal          Follow Up Plan: Client will continue to follow up with [facility] for her ongoing mental health needs    Elliot Gurney, [place] Worker  [facility] [phone]

## 2019-04-04 NOTE — Patient Instructions (Addendum)
Thank you allowing the Chronic Care Management Team to be a part of your care! It was a pleasure speaking with you today!  1. Please continue to follow up with [facility] for medication management 2. Please call this social worker with ay questions regarding your mental health needs   CCM (Chronic Care Management) Team    Ruben Reason PharmD  Clinical Pharmacist  [phone]   [place], [place] Social Worker [phone]  Goals Addressed            This Visit's Progress   . "I need to make sure mom follows up with a psychiatrist" (pt-stated)       Current Barriers:  [name] Mental Health Concerns   Clinical Social Work Clinical Goal(s):  [name] Over the next 30 days, client will follow up with [facility] as directed by patient's provider to address continued delusion that bed bugs remain in her home  Interventions: . [name] who is on DPR  interviewed and appropriate assessments performed . Followed up on  referral to [facility] to address delusional disorder . Confirmed that patient appointment scheduled for 03/28/19 with [name] ANP-C for medication assessment was kept . Patient's EMR confirmed that patient does not have a UTI and she can now stop the antibiotics . Encouraged patient's son to continue to support patient with ongoing mental health follow up with [facility] for medication management. . [name] provided  with direct contact information for this social worker in the event that any community resource needs arise   Patient Self Care Activities:  . Attends all scheduled provider appointments . Performs ADL's independently . Unable to perform IADLs independently  Please see past updates related to this goal by clicking on the "Past Updates" button in the selected goal          The patient verbalized understanding of instructions provided today and declined a print copy of patient  instruction materials.   No further follow up required: Patient to follow up with [facility] for ongoing mental health treatment

## 2019-04-05 ENCOUNTER — Ambulatory Visit: Payer: PPO | Admitting: Family Medicine

## 2019-04-06 ENCOUNTER — Ambulatory Visit: Payer: PPO | Admitting: Family Medicine

## 2019-04-11 ENCOUNTER — Telehealth: Payer: PPO

## 2019-04-13 ENCOUNTER — Other Ambulatory Visit: Payer: Self-pay

## 2019-04-13 ENCOUNTER — Ambulatory Visit: Payer: PPO | Admitting: *Deleted

## 2019-04-13 DIAGNOSIS — F428 Other obsessive-compulsive disorder: Secondary | ICD-10-CM

## 2019-04-13 NOTE — Patient Instructions (Signed)
Visit Information  Goals Addressed            This Visit's Progress   . COMPLETED: "We need to make sure she doesn't have a urinary tract infection" (pt-stated)       Current Barriers:  . Chronic Disease Management support and education needs related to chronic intermittent UTI and current mental health management needs ; patient's son indicates that psychiatry team requested that he speak with primary care provider to ensure that she DOES NOT feel the patient's current symptoms are related to possible UTI before starting new medication for management of delusions  Nurse Case Manager Clinical Goal(s):  [name] GOALS MET  Interventions:  . Evaluation of current treatment plan related to current symptoms and management of mental health concerns and patient's adherence to plan as established by provider. [name] Collaborated with LCSW regarding her contact and follow up with the patient's caregiver and his understanding that UTI has been ruled out and patient's ongoing needs are related to mental health concerns  Patient Self Care Activities:  . Unable to independently self manage current vulvar irritation and symptoms   Please see past updates related to this goal by clicking on the "Past Updates" button in the selected goal        The patient has been provided with contact information for the care management team and has been advised to call with any health related questions or concerns.  The care management team is available to follow up with the patient after provider conversation with the patient regarding recommendation for care management engagement and subsequent re-referral to the care management team.   [facility] [phone]

## 2019-04-13 NOTE — Chronic Care Management (AMB) (Signed)
  Chronic Care Management   Follow Up Note   04/13/2019 Name: Barbara Chambers MRN: [id number] DOB: 1935/02/14  Referred by: [name], MD Reason for referral : Care Coordination (collab with SW)   Barbara Chambers is a 84 y.o. year old female who is a primary care patient of [name], [name], MD. The CCM team was consulted for assistance with chronic disease management and care coordination needs.    Review of patient status, including review of consultants reports, relevant laboratory and other test results, and collaboration with appropriate care team members and the patient's provider was performed as part of comprehensive patient evaluation and provision of chronic care management services.    SDOH (Social Determinants of Health) screening performed today: None. See Care Plan for related entries.   Advanced Directives Status: N See Care Plan and Vyn[facility] application for related entries.  Outpatient Encounter Medications as of 04/13/2019  Medication Sig  . aspirin 81 MG chewable tablet Chew 81 mg by mouth daily.  [name] COENZYME Q-10 PO Take 1 capsule by mouth daily.   . DULoxetine (CYMBALTA) 20 MG capsule TAKE 1 CAPSULE BY MOUTH EVERY DAY  . fluconazole (DIFLUCAN) 150 MG tablet TAKE 1 TABLET BY MOUTH AS ONE DOSE  . hydrOXYzine (ATARAX/VISTARIL) 10 MG tablet Take 10 mg by mouth 3 (three) times daily as needed.  [name] losartan (COZAAR) 100 MG tablet Take 100 mg by mouth daily.  . metoprolol succinate (TOPROL-XL) 50 MG 24 hr tablet Take 1 tablet (50 mg total) by mouth daily.  [name] nystatin ointment (MYCOSTATIN) Apply 1 application topically 2 (two) times daily.  [name] OLANZapine (ZYPREXA) 2.5 MG tablet Take 1 tablet (2.5 mg total) by mouth at bedtime.  . simvastatin (ZOCOR) 10 MG tablet Take 1 tablet (10 mg total) by mouth at bedtime.   No facility-administered encounter medications on file as of 04/13/2019.      Goals Addressed            This Visit's Progress   . COMPLETED: "We need  to make sure she doesn't have a urinary tract infection" (pt-stated)       Current Barriers:  . Chronic Disease Management support and education needs related to chronic intermittent UTI and current mental health management needs ; patient's son indicates that psychiatry team requested that he speak with primary care provider to ensure that she DOES NOT feel the patient's current symptoms are related to possible UTI before starting new medication for management of delusions  Nurse Case Manager Clinical Goal(s):  [name] GOALS MET  Interventions:  . Evaluation of current treatment plan related to current symptoms and management of mental health concerns and patient's adherence to plan as established by provider. [name] Collaborated with LCSW regarding her contact and follow up with the patient's caregiver and his understanding that UTI has been ruled out and patient's ongoing needs are related to mental health concerns  Patient Self Care Activities:  . Unable to independently self manage current vulvar irritation and symptoms   Please see past updates related to this goal by clicking on the "Past Updates" button in the selected goal          The patient has been provided with contact information for the care management team and has been advised to call with any health related questions or concerns.    [facility] [phone]

## 2019-04-21 ENCOUNTER — Other Ambulatory Visit: Payer: Self-pay | Admitting: Family Medicine

## 2019-04-21 DIAGNOSIS — I10 Essential (primary) hypertension: Secondary | ICD-10-CM

## 2019-04-25 ENCOUNTER — Other Ambulatory Visit: Payer: Self-pay | Admitting: Adult Health

## 2019-04-25 DIAGNOSIS — B89 Unspecified parasitic disease: Secondary | ICD-10-CM

## 2019-05-03 ENCOUNTER — Ambulatory Visit: Payer: PPO | Admitting: Psychiatry

## 2019-05-08 ENCOUNTER — Other Ambulatory Visit: Payer: Self-pay | Admitting: Family Medicine

## 2019-05-08 DIAGNOSIS — I10 Essential (primary) hypertension: Secondary | ICD-10-CM

## 2019-05-08 NOTE — Telephone Encounter (Signed)
Pharmacy requesting 90 day supply

## 2019-05-11 ENCOUNTER — Ambulatory Visit (INDEPENDENT_AMBULATORY_CARE_PROVIDER_SITE_OTHER): Payer: PPO | Admitting: Family Medicine

## 2019-05-11 ENCOUNTER — Other Ambulatory Visit: Payer: Self-pay

## 2019-05-11 ENCOUNTER — Encounter: Payer: Self-pay | Admitting: Family Medicine

## 2019-05-11 VITALS — BP 123/69 | HR 67 | Temp 97.3°F | Resp 16 | Wt 132.0 lb

## 2019-05-11 DIAGNOSIS — E871 Hypo-osmolality and hyponatremia: Secondary | ICD-10-CM

## 2019-05-11 DIAGNOSIS — F22 Delusional disorders: Secondary | ICD-10-CM

## 2019-05-11 DIAGNOSIS — I10 Essential (primary) hypertension: Secondary | ICD-10-CM | POA: Diagnosis not present

## 2019-05-11 NOTE — Assessment & Plan Note (Signed)
Well controlled Continue current medications Recheck metabolic panel F/u in 3-6 months  

## 2019-05-11 NOTE — Assessment & Plan Note (Signed)
Patient back to baseline since starting Zyprexa OK to d/c SNRI No changes today

## 2019-05-11 NOTE — Progress Notes (Signed)
Patient: Barbara Chambers Female    DOB: Jun 08, 1935   84 y.o.   MRN: [id number] Visit Date: 05/11/2019  Today's Provider: Lavon Paganini, MD   Chief Complaint  Patient presents with  . Follow-up   Subjective:     HPI  Follow up for elevated blood pressure  The patient was last seen for this 4 weeks ago. Changes made at last visit include will increase metoprolol to 50 mg. Patient to continue losartan.  She reports excellent compliance with treatment. She feels that condition is Improved. She is not having side effects.   ------------------------------------------------------------------------------------  She saw Psychiatrist in [place] and was started on Zyprexa.  She is feeling back to herself and is no longer obsessively cleaning or seeing bugs in her home.  She has stopped taking Cymbalta (per Lauro Regulus, who confirmed medicaitons with me after the visit).    Allergies  Allergen Reactions  . Hctz [Hydrochlorothiazide]     Hyponatremia  . Penicillins Rash     Current Outpatient Medications:  .  aspirin 81 MG chewable tablet, Chew 81 mg by mouth daily., Disp: , Rfl:  .  COENZYME Q-10 PO, Take 1 capsule by mouth daily. , Disp: , Rfl:  .  DULoxetine (CYMBALTA) 20 MG capsule, TAKE 1 CAPSULE BY MOUTH EVERY DAY, Disp: 90 capsule, Rfl: 2 .  hydrOXYzine (ATARAX/VISTARIL) 10 MG tablet, Take 10 mg by mouth 3 (three) times daily as needed., Disp: , Rfl:  .  losartan (COZAAR) 100 MG tablet, Take 100 mg by mouth daily., Disp: , Rfl:  .  metoprolol succinate (TOPROL-XL) 50 MG 24 hr tablet, TAKE 1 TABLET BY MOUTH EVERY DAY, Disp: 90 tablet, Rfl: 1 .  OLANZapine (ZYPREXA) 2.5 MG tablet, Take 1 tablet (2.5 mg total) by mouth at bedtime., Disp: 30 tablet, Rfl: 2 .  simvastatin (ZOCOR) 10 MG tablet, Take 1 tablet (10 mg total) by mouth at bedtime., Disp: 90 tablet, Rfl: 3 .  sertraline (ZOLOFT) 50 MG tablet, , Disp: , Rfl:   Review of Systems  Constitutional: Negative.    Respiratory: Negative.   Cardiovascular: Negative.     Social History   Tobacco Use  . Smoking status: Never Smoker  . Smokeless tobacco: Never Used  Substance Use Topics  . Alcohol use: No      Objective:   BP 123/69 (BP Location: Left Arm, Patient Position: Sitting, Cuff Size: Normal)   Pulse 67   Temp (!) 97.3 F (36.3 C) (Temporal)   Resp 16   Wt 132 lb (59.9 kg)   BMI 23.38 kg/m  Vitals:   05/11/19 1022  BP: 123/69  Pulse: 67  Resp: 16  Temp: (!) 97.3 F (36.3 C)  TempSrc: Temporal  Weight: 132 lb (59.9 kg)  Body mass index is 23.38 kg/m.   Physical Exam Vitals signs reviewed.  Constitutional:      General: She is not in acute distress.    Appearance: Normal appearance. She is not diaphoretic.  HENT:     Head: Normocephalic and atraumatic.  Cardiovascular:     Rate and Rhythm: Normal rate and regular rhythm.     Pulses: Normal pulses.     Heart sounds: Normal heart sounds. No murmur.  Pulmonary:     Effort: Pulmonary effort is normal. No respiratory distress.     Breath sounds: Normal breath sounds. No wheezing or rhonchi.  Skin:    General: Skin is warm and dry.  Capillary Refill: Capillary refill takes less than 2 seconds.     Findings: No rash.  Neurological:     Mental Status: She is alert and oriented to person, place, and time. Mental status is at baseline.  Psychiatric:        Mood and Affect: Mood normal.        Behavior: Behavior normal.        Thought Content: Thought content normal.      No results found for any visits on 05/11/19.     Assessment & Plan   Problem List Items Addressed This Visit      Cardiovascular and Mediastinum   Benign essential HTN - Primary    Well controlled Continue current medications Recheck metabolic panel F/u in 3-6 months         Other   Delusional disorder ([place])    Patient back to baseline since starting Zyprexa OK to d/c SNRI No changes today      Hyponatremia    Resolved when  last checked Will recheck BMP today Was 2/2 Zoloft and HCTZ      Relevant Orders   Basic Metabolic Panel (BMET)       Return in about 2 months (around 07/11/2019) for CPE, as scheduled.   The entirety of the information documented in the History of Present Illness, Review of Systems and Physical Exam were personally obtained by me. Portions of this information were initially documented by Lynford Humphrey , CMA and reviewed by me for thoroughness and accuracy.    [name], [name], MD MPH [facility]

## 2019-05-11 NOTE — Assessment & Plan Note (Signed)
Resolved when last checked Will recheck BMP today Was 2/2 Zoloft and HCTZ

## 2019-05-12 ENCOUNTER — Telehealth: Payer: Self-pay

## 2019-05-12 LAB — BASIC METABOLIC PANEL
BUN/Creatinine Ratio: 22 (ref 12–28)
BUN: 17 mg/dL (ref 8–27)
CO2: 23 mmol/L (ref 20–29)
Calcium: 9.1 mg/dL (ref 8.7–10.3)
Chloride: 103 mmol/L (ref 96–106)
Creatinine, Ser: 0.77 mg/dL (ref 0.57–1.00)
GFR calc Af Amer: 83 mL/min/{1.73_m2} (ref >59)
GFR calc non Af Amer: 72 mL/min/{1.73_m2} (ref >59)
Glucose: 91 mg/dL (ref 65–99)
Potassium: 4.2 mmol/L (ref 3.5–5.2)
Sodium: 141 mmol/L (ref 134–144)

## 2019-05-12 NOTE — Telephone Encounter (Signed)
-----   Message from [name], MD sent at 05/12/2019  9:00 AM EST ----- Normal labs

## 2019-05-12 NOTE — Telephone Encounter (Signed)
Tried calling; pt's voicemail is not set up.   Thanks,   -Elbert Polyakov  

## 2019-05-15 NOTE — Telephone Encounter (Signed)
Patient advised as below.  

## 2019-06-13 ENCOUNTER — Other Ambulatory Visit: Payer: Self-pay | Admitting: Family Medicine

## 2019-06-13 DIAGNOSIS — I10 Essential (primary) hypertension: Secondary | ICD-10-CM

## 2019-06-17 ENCOUNTER — Other Ambulatory Visit: Payer: Self-pay | Admitting: Family Medicine

## 2019-07-04 ENCOUNTER — Other Ambulatory Visit: Payer: Self-pay | Admitting: Adult Health

## 2019-07-04 DIAGNOSIS — B89 Unspecified parasitic disease: Secondary | ICD-10-CM

## 2019-07-04 NOTE — Telephone Encounter (Signed)
You saw patient end of Sept, and wanted a 4 week f/up. She's not scheduled back yet.

## 2019-07-04 NOTE — Telephone Encounter (Signed)
Lets see if we can get her an appt. - telehealth ok.

## 2019-07-05 NOTE — Telephone Encounter (Signed)
Please get her apt with Rollene Fare, telehealth ok

## 2019-07-11 ENCOUNTER — Telehealth: Payer: Self-pay

## 2019-07-11 NOTE — Telephone Encounter (Signed)
CPE rescheduled to 09/28/2019.

## 2019-07-11 NOTE — Telephone Encounter (Signed)
Copied from [place] [id number]. Topic: General - Call Back - No Documentation >> Jul 11, 2019  2:36 PM Barbara Chambers wrote: Patient states she is returning call to office- no documentation

## 2019-07-12 NOTE — Telephone Encounter (Signed)
Patient has an appointment tomorrow scheduled with gina

## 2019-07-12 NOTE — Telephone Encounter (Signed)
Noted thank you

## 2019-07-12 NOTE — Progress Notes (Signed)
Subjective:   Barbara Chambers is a 85 y.o. female who presents for Medicare Annual (Subsequent) preventive examination.    This visit is being conducted through telemedicine due to the COVID-19 pandemic. This patient has given me verbal consent via doximity to conduct this visit, patient states they are participating from their home address. Some vital signs may be absent or patient reported.    Patient identification: identified by name, DOB, and current address  Review of Systems:  N/A  Cardiac Risk Factors include: advanced age (>15men, >83 women);dyslipidemia;hypertension     Objective:     Vitals: There were no vitals taken for this visit.  There is no height or weight on file to calculate BMI. Unable to obtain vitals due to visit being conducted via telephonically.   Advanced Directives 07/13/2019 03/04/2019 03/03/2019 12/02/2018 11/17/2018 07/11/2018 11/10/2017  Does Patient Have a Medical Advance Directive? Yes Yes No Yes No Yes No  Type of Paramedic of Barbara Chambers;Living will Barbara Chambers;Living will - Barbara Chambers;Living will - Barbara Chambers;Living will -  Does patient want to make changes to medical advance directive? - No - Patient declined - - - - -  Copy of Barbara Chambers in Chart? No - copy requested No - copy requested - No - copy requested - No - copy requested -  Would patient like information on creating a medical advance directive? - - - No - Patient declined No - Patient declined - No - Patient declined    Tobacco Social History   Tobacco Use  Smoking Status Never Smoker  Smokeless Tobacco Never Used     Counseling given: Not Answered   Clinical Intake:  Pre-visit preparation completed: Yes  Pain : No/denies pain Pain Score: 0-No pain     Diabetes: No  How often do you need to have someone help you when you read instructions, pamphlets, or other written materials from your  doctor or pharmacy?: 1 - Never  Interpreter Needed?: No  Information entered by :: [facility], LPN  Past Medical History:  Diagnosis Date  . Cancer [facility]) 2011   lymphoma  . Hyperlipidemia   . Hypertension    Past Surgical History:  Procedure Laterality Date  . cataract surgery Bilateral 2005  . CHOLECYSTECTOMY  1991  . RECONSTRUCTION OF EYELID     Family History  Problem Relation Age of Onset  . Hypertension Mother   . Stroke Mother   . CAD Mother   . Hypertension Father   . Heart attack Father   . Hypertension Brother   . Seizures Brother   . Hypertension Son   . Breast cancer Neg Hx    Social History   Socioeconomic History  . Marital status: Widowed    Spouse name: Barbara Chambers  . Number of children: 3  . Years of education: Not on file  . Highest education level: 12th grade  Occupational History  . Occupation: retired  Tobacco Use  . Smoking status: Never Smoker  . Smokeless tobacco: Never Used  Substance and Sexual Activity  . Alcohol use: No  . Drug use: No  . Sexual activity: Not on file  Other Topics Concern  . Not on file  Social History Narrative   Pt has 1 son that was killed in a car accident at age 39.    [name] is primary caregiver   Social Determinants of Health   Financial Resource Strain: Low Risk   .  Difficulty of Paying Living Expenses: Not hard at all  Food Insecurity: No Food Insecurity  . Worried About Charity fundraiser in the Last Year: Never true  . Ran Out of Food in the Last Year: Never true  Transportation Needs: No Transportation Needs  . Lack of Transportation (Medical): No  . Lack of Transportation (Non-Medical): No  Physical Activity: Inactive  . Days of Exercise per Week: 0 days  . Minutes of Exercise per Session: 0 min  Stress: No Stress Concern Present  . Feeling of Stress : Not at all  Social Connections: Slightly Isolated  . Frequency of Communication with Friends and Family: More than three times a week  .  Frequency of Social Gatherings with Friends and Family: More than three times a week  . Attends Religious Services: More than 4 times per year  . Active Member of Clubs or Organizations: Yes  . Attends Archivist Meetings: More than 4 times per year  . Marital Status: Widowed    Outpatient Encounter Medications as of 07/13/2019  Medication Sig  . aspirin 81 MG chewable tablet Chew 81 mg by mouth daily.  [name] COENZYME Q-10 PO Take 1 capsule by mouth daily.   . DULoxetine (CYMBALTA) 20 MG capsule Take 20 mg by mouth daily.  [name] losartan (COZAAR) 100 MG tablet TAKE 1 TABLET BY MOUTH EVERY DAY  . metoprolol succinate (TOPROL-XL) 50 MG 24 hr tablet TAKE 1 TABLET BY MOUTH EVERY DAY  . OLANZapine (ZYPREXA) 2.5 MG tablet TAKE 1 TABLET (2.5 MG TOTAL) BY MOUTH AT BEDTIME.  . simvastatin (ZOCOR) 10 MG tablet Take 1 tablet (10 mg total) by mouth at bedtime.  . [DISCONTINUED] OLANZapine (ZYPREXA) 2.5 MG tablet Take 1 tablet (2.5 mg total) by mouth at bedtime.   No facility-administered encounter medications on file as of 07/13/2019.    Activities of Daily Living In your present state of health, do you have any difficulty performing the following activities: 07/13/2019 03/04/2019  Hearing? N N  Vision? N N  Difficulty concentrating or making decisions? N N  Walking or climbing stairs? N Y  Dressing or bathing? N Y  Doing errands, shopping? N N  Preparing Food and eating ? N -  Using the Toilet? N -  In the past six months, have you accidently leaked urine? Y -  Comment Occasionally with urges. -  Do you have problems with loss of bowel control? N -  Managing your Medications? N -  Managing your Finances? N -  Housekeeping or managing your Housekeeping? N -  Some recent data might be hidden    Patient Care Team: [name] [name], MD as PCP - General (Family Medicine) Lloyd Huger, MD as Consulting Physician (Oncology) Corey Skains, MD as Consulting Physician (Cardiology)  Brendolyn Patty, MD as Consulting Physician (Dermatology) Birder Robson, MD as Referring Physician (Ophthalmology)    Assessment:   This is a routine wellness examination for Barbara Chambers.  Exercise Activities and Dietary recommendations Current Exercise Habits: The patient does not participate in regular exercise at present, Exercise limited by: None identified  Goals      Patient Stated   . "I need to make sure mom follows up with a psychiatrist" (pt-stated)     Current Barriers:  [name] Mental Health Concerns   Clinical Social Work Clinical Goal(s):  [name] Over the next 30 days, client will follow up with [facility] as directed by patient's provider to address continued delusion that bed  bugs remain in her home  Interventions: . [name] who is on DPR  interviewed and appropriate assessments performed . Followed up on  referral to [facility] to address delusional disorder . Confirmed that patient appointment scheduled for 03/28/19 with [name] ANP-C for medication assessment was kept . Patient's EMR confirmed that patient does not have a UTI and she can now stop the antibiotics . Encouraged patient's son to continue to support patient with ongoing mental health follow up with [facility] for medication management. . [name] provided  with direct contact information for this social worker in the event that any community resource needs arise   Patient Self Care Activities:  . Attends all scheduled provider appointments . Performs ADL's independently . Unable to perform IADLs independently  Please see past updates related to this goal by clicking on the "Past Updates" button in the selected goal        Other   . Exercise     Starting 06/24/16, I will start back walking 4 days a week for 45 minutes.    . Exercise     Pt plans to start back walking 5 days a week for 1 hour (3 miles).     [name] LIFESTYLE - DECREASE FALLS RISK     Recommend  to drink at least 6-8 8oz glasses of water per day.     . Prevent falls     Recommend to remove any items from the home that may cause slips or trips.       Fall Risk: Fall Risk  07/13/2019 07/11/2018 07/08/2017 06/24/2016 12/26/2014  Falls in the past year? 1 1 No Yes No  Number falls in past yr: 0 0 - 1 -  Comment - - - tripped -  Injury with Fall? 0 0 - No -  Comment - - - bruising -  Follow up Falls prevention discussed Falls prevention discussed - Falls prevention discussed -    FALL RISK PREVENTION PERTAINING TO THE HOME:  Any stairs in or around the home? Yes  If so, are there any without handrails? No   Home free of loose throw rugs in walkways, pet beds, electrical cords, etc? Yes  Adequate lighting in your home to reduce risk of falls? Yes   ASSISTIVE DEVICES UTILIZED TO PREVENT FALLS:  Life alert? No  Use of a cane, walker or w/c? No  Grab bars in the bathroom? Yes  Shower chair or bench in shower? No  Elevated toilet seat or a handicapped toilet? Yes    TIMED UP AND GO:  Was the test performed? No .    Depression Screen PHQ 2/9 Scores 07/13/2019 05/11/2019 07/11/2018 07/08/2017  PHQ - 2 Score 0 0 0 0  PHQ- 9 Score - 0 - 0     Cognitive Function: Declined today.      6CIT Screen 01/20/2019 06/24/2016  What Year? 0 points 0 points  What month? 0 points 0 points  What time? 0 points 0 points  Count back from 20 0 points 0 points  Months in reverse 0 points 0 points  Repeat phrase 0 points 2 points  Total Score 0 2    Immunization History  Administered Date(s) Administered  . Fluad Quad(high Dose 65+) 03/17/2019  . Influenza, High Dose Seasonal PF 06/24/2016, 04/20/2017, 05/04/2018  . Pneumococcal Conjugate-13 03/30/2014  . Pneumococcal Polysaccharide-23 04/20/2017  . Td 04/05/2017  . Tdap 07/14/2005    Qualifies for Shingles Vaccine? Yes .  Due for Shingrix. Pt has been advised to call insurance company to determine out of pocket expense. Advised  may also receive vaccine at local pharmacy or Health Dept. Verbalized acceptance and understanding.  Tdap: Up to date  Flu Vaccine: Up to date  Pneumococcal Vaccine: Completed series  Screening Tests Health Maintenance  Topic Date Due  . TETANUS/TDAP  04/06/2027  . INFLUENZA VACCINE  Completed  . DEXA SCAN  Completed  . PNA vac Low Risk Adult  Completed    Cancer Screenings:  Colorectal Screening: No longer required.   Mammogram: No longer required.   Bone Density: Completed 04/13/13. Results reflect NORMAL. No repeat needed unless advised by a physician.  Lung Cancer Screening: (Low Dose CT Chest recommended if Age 38-80 years, 30 pack-year currently smoking OR have quit w/in 15years.) does not qualify.   Additional Screening:  Vision Screening: Recommended annual ophthalmology exams for early detection of glaucoma and other disorders of the eye.  Dental Screening: Recommended annual dental exams for proper oral hygiene  Community Resource Referral:  CRR required this visit?  No       Plan:  I have personally reviewed and addressed the Medicare Annual Wellness questionnaire and have noted the following in the patient's chart:  A. Medical and social history B. Use of alcohol, tobacco or illicit drugs  C. Current medications and supplements D. Functional ability and status E.  Nutritional status F.  Physical activity G. Advance directives H. List of other physicians I.  Hospitalizations, surgeries, and ER visits in previous 12 months J.  [place] such as hearing and vision if needed, cognitive and depression L. Referrals and appointments   In addition, I have reviewed and discussed with patient certain preventive protocols, quality metrics, and best practice recommendations. A written personalized care plan for preventive services as well as general preventive health recommendations were provided to patient. Nurse Health Advisor  Signed,     Karlisa Gaubert [place], [place]  [phone] Nurse Health Advisor   Nurse Notes: None.

## 2019-07-12 NOTE — Telephone Encounter (Signed)
Has she been reached yet to set up apt with Rollene Fare?

## 2019-07-13 ENCOUNTER — Ambulatory Visit (INDEPENDENT_AMBULATORY_CARE_PROVIDER_SITE_OTHER): Payer: PPO

## 2019-07-13 ENCOUNTER — Other Ambulatory Visit: Payer: Self-pay

## 2019-07-13 ENCOUNTER — Encounter: Payer: PPO | Admitting: Family Medicine

## 2019-07-13 ENCOUNTER — Ambulatory Visit: Payer: PPO | Admitting: Adult Health

## 2019-07-13 DIAGNOSIS — Z0000 Encounter for general adult medical examination without abnormal findings: Secondary | ICD-10-CM

## 2019-07-13 NOTE — Patient Instructions (Signed)
[name] , Thank you for taking time to come for your Medicare Wellness Visit. I appreciate your ongoing commitment to your health goals. Please review the following plan we discussed and let me know if I can assist you in the future.   Screening recommendations/referrals: Colonoscopy: No longer required.  Mammogram: No longer required.  Bone Density: Up to date. Previous DEXA scan was normal. No repeat needed unless advised by a physician. Recommended yearly ophthalmology/optometry visit for glaucoma screening and checkup Recommended yearly dental visit for hygiene and checkup  Vaccinations: Influenza vaccine: Up to date Pneumococcal vaccine: Completed series Tdap vaccine: Up to date, due 03/2027 Shingles vaccine: Pt declines today.     Advanced directives: Please bring a copy of your POA (Power of Attorney) and/or Living Will to your next appointment.   Conditions/risks identified: Fall risk prevention discussed today. Recommend to start back walking a couple days weekly and increasing as tolerated.  Next appointment: 09/28/19 @ 9:00 AM with Dr Barbara Chambers. Declined scheduling an AWV for 2022 at this time.    Preventive Care 25 Years and Older, Female Preventive care refers to lifestyle choices and visits with your health care provider that can promote health and wellness. What does preventive care include?  A yearly physical exam. This is also called an annual well check.  Dental exams once or twice a year.  Routine eye exams. Ask your health care provider how often you should have your eyes checked.  Personal lifestyle choices, including:  Daily care of your teeth and gums.  Regular physical activity.  Eating a healthy diet.  Avoiding tobacco and drug use.  Limiting alcohol use.  Practicing safe sex.  Taking low-dose aspirin every day.  Taking vitamin and mineral supplements as recommended by your health care provider. What happens during an annual well check? The  services and screenings done by your health care provider during your annual well check will depend on your age, overall health, lifestyle risk factors, and family history of disease. Counseling  Your health care provider may ask you questions about your:  Alcohol use.  Tobacco use.  Drug use.  Emotional well-being.  Home and relationship well-being.  Sexual activity.  Eating habits.  History of falls.  Memory and ability to understand (cognition).  Work and work Statistician.  Reproductive health. Screening  You may have the following tests or measurements:  Height, weight, and BMI.  Blood pressure.  Lipid and cholesterol levels. These may be checked every 5 years, or more frequently if you are over 74 years old.  Skin check.  Lung cancer screening. You may have this screening every year starting at age 74 if you have a 30-pack-year history of smoking and currently smoke or have quit within the past 15 years.  Fecal occult blood test (FOBT) of the stool. You may have this test every year starting at age 66.  Flexible sigmoidoscopy or colonoscopy. You may have a sigmoidoscopy every 5 years or a colonoscopy every 10 years starting at age 6.  Hepatitis C blood test.  Hepatitis B blood test.  Sexually transmitted disease (STD) testing.  Diabetes screening. This is done by checking your blood sugar (glucose) after you have not eaten for a while (fasting). You may have this done every 1-3 years.  Bone density scan. This is done to screen for osteoporosis. You may have this done starting at age 64.  Mammogram. This may be done every 1-2 years. Talk to your health care provider about how  often you should have regular mammograms. Talk with your health care provider about your test results, treatment options, and if necessary, the need for more tests. Vaccines  Your health care provider may recommend certain vaccines, such as:  Influenza vaccine. This is recommended  every year.  Tetanus, diphtheria, and acellular pertussis (Tdap, Td) vaccine. You may need a Td booster every 10 years.  Zoster vaccine. You may need this after age 26.  Pneumococcal 13-valent conjugate (PCV13) vaccine. One dose is recommended after age 52.  Pneumococcal polysaccharide (PPSV23) vaccine. One dose is recommended after age 56. Talk to your health care provider about which screenings and vaccines you need and how often you need them. This information is not intended to replace advice given to you by your health care provider. Make sure you discuss any questions you have with your health care provider. Document Released: 07/12/2015 Document Revised: 03/04/2016 Document Reviewed: 04/16/2015 Elsevier Interactive Patient Education  2017 [place] Prevention in the Home Falls can cause injuries. They can happen to people of all ages. There are many things you can do to make your home safe and to help prevent falls. What can I do on the outside of my home?  Regularly fix the edges of walkways and driveways and fix any cracks.  Remove anything that might make you trip as you walk through a door, such as a raised step or threshold.  Trim any bushes or trees on the path to your home.  Use bright outdoor lighting.  Clear any walking paths of anything that might make someone trip, such as rocks or tools.  Regularly check to see if handrails are loose or broken. Make sure that both sides of any steps have handrails.  Any raised decks and porches should have guardrails on the edges.  Have any leaves, snow, or ice cleared regularly.  Use sand or salt on walking paths during winter.  Clean up any spills in your garage right away. This includes oil or grease spills. What can I do in the bathroom?  Use night lights.  Install grab bars by the toilet and in the tub and shower. Do not use towel bars as grab bars.  Use non-skid mats or decals in the tub or shower.  If  you need to sit down in the shower, use a plastic, non-slip stool.  Keep the floor dry. Clean up any water that spills on the floor as soon as it happens.  Remove soap buildup in the tub or shower regularly.  Attach bath mats securely with double-sided non-slip rug tape.  Do not have throw rugs and other things on the floor that can make you trip. What can I do in the bedroom?  Use night lights.  Make sure that you have a light by your bed that is easy to reach.  Do not use any sheets or blankets that are too big for your bed. They should not hang down onto the floor.  Have a firm chair that has side arms. You can use this for support while you get dressed.  Do not have throw rugs and other things on the floor that can make you trip. What can I do in the kitchen?  Clean up any spills right away.  Avoid walking on wet floors.  Keep items that you use a lot in easy-to-reach places.  If you need to reach something above you, use a strong step stool that has a grab bar.  Keep electrical  cords out of the way.  Do not use floor polish or wax that makes floors slippery. If you must use wax, use non-skid floor wax.  Do not have throw rugs and other things on the floor that can make you trip. What can I do with my stairs?  Do not leave any items on the stairs.  Make sure that there are handrails on both sides of the stairs and use them. Fix handrails that are broken or loose. Make sure that handrails are as long as the stairways.  Check any carpeting to make sure that it is firmly attached to the stairs. Fix any carpet that is loose or worn.  Avoid having throw rugs at the top or bottom of the stairs. If you do have throw rugs, attach them to the floor with carpet tape.  Make sure that you have a light switch at the top of the stairs and the bottom of the stairs. If you do not have them, ask someone to add them for you. What else can I do to help prevent falls?  Wear shoes  that:  Do not have high heels.  Have rubber bottoms.  Are comfortable and fit you well.  Are closed at the toe. Do not wear sandals.  If you use a stepladder:  Make sure that it is fully opened. Do not climb a closed stepladder.  Make sure that both sides of the stepladder are locked into place.  Ask someone to hold it for you, if possible.  Clearly mark and make sure that you can see:  Any grab bars or handrails.  First and last steps.  Where the edge of each step is.  Use tools that help you move around (mobility aids) if they are needed. These include:  Canes.  Walkers.  Scooters.  Crutches.  Turn on the lights when you go into a dark area. Replace any light bulbs as soon as they burn out.  Set up your furniture so you have a clear path. Avoid moving your furniture around.  If any of your floors are uneven, fix them.  If there are any pets around you, be aware of where they are.  Review your medicines with your doctor. Some medicines can make you feel dizzy. This can increase your chance of falling. Ask your doctor what other things that you can do to help prevent falls. This information is not intended to replace advice given to you by your health care provider. Make sure you discuss any questions you have with your health care provider. Document Released: 04/11/2009 Document Revised: 11/21/2015 Document Reviewed: 07/20/2014 Elsevier Interactive Patient Education  2017 [employer].

## 2019-07-17 ENCOUNTER — Other Ambulatory Visit: Payer: Self-pay | Admitting: Family Medicine

## 2019-07-17 NOTE — Telephone Encounter (Signed)
Medication: losartan (COZAAR) 100 MG tablet [id number]   Has the patient contacted their pharmacy? Yes  (Agent: If no, request that the patient contact the pharmacy for the refill.) (Agent: If yes, when and what did the pharmacy advise?)  Preferred Pharmacy (with phone number or street name): [id number] [place], [place] - [address]  Phone:  [phone] Fax:  [phone]     Agent: Please be advised that RX refills may take up to 3 business days. We ask that you follow-up with your pharmacy.

## 2019-07-18 ENCOUNTER — Encounter: Payer: Self-pay | Admitting: Family Medicine

## 2019-07-18 ENCOUNTER — Other Ambulatory Visit: Payer: Self-pay

## 2019-07-18 ENCOUNTER — Ambulatory Visit (INDEPENDENT_AMBULATORY_CARE_PROVIDER_SITE_OTHER): Payer: PPO | Admitting: Family Medicine

## 2019-07-18 VITALS — BP 136/68 | HR 91 | Temp 96.9°F | Resp 16 | Wt 139.0 lb

## 2019-07-18 DIAGNOSIS — L853 Xerosis cutis: Secondary | ICD-10-CM

## 2019-07-18 DIAGNOSIS — I872 Venous insufficiency (chronic) (peripheral): Secondary | ICD-10-CM

## 2019-07-18 NOTE — Patient Instructions (Signed)

## 2019-07-18 NOTE — Progress Notes (Signed)
Patient: Barbara Chambers Female    DOB: [birth year]   85 y.o.   MRN: [id number] Visit Date: 07/18/2019  Today's Provider: Lavon Paganini, MD   Chief Complaint  Patient presents with  . Rash   Subjective:     HPI Patient here today C/O rash on legs and arms x's several months. Patient reports rash is better. Patient concerned that medications are causing or helping rash. Patient reports Barbara Chambers has been taking medications daily.  Some itching, but in general, much better.    Barbara Chambers has noticed more LE edema.  Barbara Chambers is not wearing compression stockings.  Barbara Chambers denies SOB, CP, claudication.   Allergies  Allergen Reactions  . Hctz [Hydrochlorothiazide]     Hyponatremia  . Penicillins Rash     Current Outpatient Medications:  .  aspirin 81 MG chewable tablet, Chew 81 mg by mouth daily., Disp: , Rfl:  .  COENZYME Q-10 PO, Take 1 capsule by mouth daily. , Disp: , Rfl:  .  DULoxetine (CYMBALTA) 20 MG capsule, Take 20 mg by mouth daily., Disp: , Rfl:  .  losartan (COZAAR) 100 MG tablet, TAKE 1 TABLET BY MOUTH EVERY DAY, Disp: 90 tablet, Rfl: 3 .  metoprolol succinate (TOPROL-XL) 50 MG 24 hr tablet, TAKE 1 TABLET BY MOUTH EVERY DAY, Disp: 90 tablet, Rfl: 1 .  OLANZapine (ZYPREXA) 2.5 MG tablet, TAKE 1 TABLET (2.5 MG TOTAL) BY MOUTH AT BEDTIME., Disp: 30 tablet, Rfl: 2 .  simvastatin (ZOCOR) 10 MG tablet, Take 1 tablet (10 mg total) by mouth at bedtime., Disp: 90 tablet, Rfl: 3  Review of Systems  Constitutional: Negative.   HENT: Negative.   Respiratory: Negative.   Cardiovascular: Positive for leg swelling.  Skin: Positive for rash.  Psychiatric/Behavioral: Negative for agitation, behavioral problems, confusion, decreased concentration and self-injury. The patient is not nervous/anxious.     Social History   Tobacco Use  . Smoking status: Never Smoker  . Smokeless tobacco: Never Used  Substance Use Topics  . Alcohol use: No      Objective:   BP 136/68 (BP Location: Left Arm,  Patient Position: Sitting, Cuff Size: Normal)   Pulse 91   Temp (!) 96.9 F (36.1 C) (Temporal)   Resp 16   Wt 139 lb (63 kg)   BMI 24.62 kg/m  Vitals:   07/18/19 0927  BP: 136/68  Pulse: 91  Resp: 16  Temp: (!) 96.9 F (36.1 C)  TempSrc: Temporal  Weight: 139 lb (63 kg)  Body mass index is 24.62 kg/m.   Physical Exam Vitals reviewed.  Constitutional:      General: Barbara Chambers is not in acute distress.    Appearance: Normal appearance. Barbara Chambers is not diaphoretic.  HENT:     Head: Normocephalic and atraumatic.  Eyes:     General: No scleral icterus.    Conjunctiva/sclera: Conjunctivae normal.  Cardiovascular:     Rate and Rhythm: Normal rate and regular rhythm.  Pulmonary:     Effort: Pulmonary effort is normal. No respiratory distress.     Breath sounds: Normal breath sounds. No wheezing.  Musculoskeletal:     Right lower leg: Edema present.     Left lower leg: Edema present.  Skin:    General: Skin is warm and dry.     Comments: Dry skin with some scattered AK's.  Varicose veins on bilateral extremities  Neurological:     Mental Status: Barbara Chambers is alert and oriented to person, place, and time. Mental  status is at baseline.      No results found for any visits on 07/18/19.     Assessment & Plan    1. Dry skin dermatitis -Ongoing issue -Discussed with patient that Barbara Chambers does not have a rash or any allergic reaction to any of her medications, but rather significant dry skin -Encouraged emollient use twice daily -Encouraged use of unscented and sensitive skin products  2. Venous insufficiency of both lower extremities -Ongoing and intermittent problem -Barbara Chambers has no signs of systemic fluid overload -We discussed venous insufficiency -Discussed need to elevate legs when possible -Discussed compression stockings when possible -Discussed return precautions   Return in about 3 months (around 10/16/2019) for CPE.  As scheduled   The entirety of the information documented in  the History of Present Illness, Review of Systems and Physical Exam were personally obtained by me. Portions of this information were initially documented by Lynford Humphrey, CMA and reviewed by me for thoroughness and accuracy.    Jaidalyn Schillo, [name], MD MPH [facility]

## 2019-07-27 ENCOUNTER — Telehealth: Payer: Self-pay

## 2019-07-27 NOTE — Telephone Encounter (Signed)
Copied from [place] [id number]. Topic: General - Inquiry >> Jul 27, 2019  3:31 PM Richardo Priest, [place] wrote: Reason for CRM: [name] called in stating mother has thrown away losartan a few days ago, and family has just noticed. Pt's son is worried as they have recently gone to pharmacy to have it refilled and is wondering if anything can be done. Pt's son also stated he would like a call back from PCP to discuss a few other things about mother. Please advise and call back is [phone].

## 2019-07-27 NOTE — Telephone Encounter (Signed)
We can call the pharmacy and ask them to go ahead and refill early.  She is not the first person to have done this.  It is possible that insurance will decline it, but it is available for $4 per month at [facility] if nothing else.  I am unable to call son back today as I just finished with patients.  Feel free to get some information from him and I can try to call between patients tomorrow.

## 2019-07-28 ENCOUNTER — Other Ambulatory Visit: Payer: Self-pay | Admitting: Physician Assistant

## 2019-07-28 ENCOUNTER — Other Ambulatory Visit: Payer: Self-pay | Admitting: Family Medicine

## 2019-07-28 DIAGNOSIS — B372 Candidiasis of skin and nail: Secondary | ICD-10-CM

## 2019-07-28 DIAGNOSIS — I10 Essential (primary) hypertension: Secondary | ICD-10-CM

## 2019-07-28 NOTE — Telephone Encounter (Signed)
Medication is ready for pick up at [facility], [name] advised. Barbara Chambers is requesting a copy of patients medications. Barbara Chambers is on DPR.

## 2019-08-04 NOTE — Telephone Encounter (Signed)
Encounter opened in error. KW 

## 2019-08-10 DIAGNOSIS — Z03818 Encounter for observation for suspected exposure to other biological agents ruled out: Secondary | ICD-10-CM | POA: Diagnosis not present

## 2019-09-01 ENCOUNTER — Ambulatory Visit: Payer: Self-pay

## 2019-09-01 ENCOUNTER — Ambulatory Visit (INDEPENDENT_AMBULATORY_CARE_PROVIDER_SITE_OTHER): Payer: PPO | Admitting: Family Medicine

## 2019-09-01 ENCOUNTER — Encounter: Payer: Self-pay | Admitting: Family Medicine

## 2019-09-01 ENCOUNTER — Other Ambulatory Visit: Payer: Self-pay

## 2019-09-01 VITALS — BP 162/79 | HR 72 | Temp 97.5°F | Resp 16 | Ht 60.0 in | Wt 143.0 lb

## 2019-09-01 DIAGNOSIS — I10 Essential (primary) hypertension: Secondary | ICD-10-CM | POA: Diagnosis not present

## 2019-09-01 DIAGNOSIS — L570 Actinic keratosis: Secondary | ICD-10-CM | POA: Diagnosis not present

## 2019-09-01 DIAGNOSIS — L719 Rosacea, unspecified: Secondary | ICD-10-CM

## 2019-09-01 MED ORDER — AMLODIPINE BESYLATE 5 MG PO TABS: 5.0000 mg | ORAL_TABLET | Freq: Every day | ORAL | 3 refills | Status: DC

## 2019-09-01 MED ORDER — METRONIDAZOLE 0.75 % EX GEL: 1.0000 "application " | Freq: Every day | CUTANEOUS | 0 refills | Status: DC

## 2019-09-01 NOTE — Assessment & Plan Note (Signed)
New problem Rash on face is consistent with rosacea Start MetroGel Follow-up with her dermatologist

## 2019-09-01 NOTE — Patient Instructions (Signed)
Rosacea Rosacea is a long-term (chronic) condition that affects the skin of the face, including the cheeks, nose, forehead, and chin. This condition can also affect the eyes. Rosacea causes blood vessels near the surface of the skin to enlarge, which results in redness. What are the causes? The cause of this condition is not known. Certain triggers can make rosacea worse, including:  Hot baths.  Exercise.  Sunlight.  Very hot or cold temperatures.  Hot or spicy foods and drinks.  Drinking alcohol.  Stress.  Taking blood pressure medicine.  Long-term use of topical steroids on the face. What increases the risk? You are more likely to develop this condition if you:  Are older than 83 years of age.  Are a woman.  Have light-colored skin (light complexion).  Have a family history of rosacea. What are the signs or symptoms? Symptoms of this condition include:  Redness of the face.  Red bumps or pimples on the face.  A red, enlarged nose.  Blushing easily.  Red lines on the skin.  Irritated, burning, or itchy feeling in the eyes.  Swollen eyelids.  Drainage from the eyes.  Feeling like there is something in your eye. How is this diagnosed? This condition is diagnosed with a medical history and physical exam. How is this treated? There is no cure for this condition, but treatment can help to control your symptoms. Your health care provider may recommend that you see a skin specialist (dermatologist). Treatment may include:  Medicines that are applied to the skin or taken by mouth (orally). This can include antibiotic medicines.  Laser treatment to improve the appearance of the skin.  Surgery. This is rare. Your health care provider will also recommend the best way to take care of your skin. Even after your skin improves, you will likely need to continue treatment to prevent your rosacea from coming back. Follow these instructions at home: Skin care Take care  of your skin as told by your health care provider. You may be told to do these things:  Wash your skin gently two or more times each day.  Use mild soap.  Use a sunscreen or sunblock with SPF 30 or greater.  Use gentle cosmetics that are meant for sensitive skin.  Shave with an electric shaver instead of a blade. Lifestyle  Try to keep track of what foods trigger this condition. Avoid any triggers. These may include: ? Spicy foods. ? Seafood. ? Cheese. ? Hot liquids. ? Nuts. ? Chocolate. ? Iodized salt.  Do not drink alcohol.  Avoid extremely cold or hot temperatures.  Try to reduce your stress. If you need help, talk with your health care provider.  When you exercise, do these things to stay cool: ? Limit sun exposure to your face. ? Use a fan. ? Do shorter and more frequent intervals of exercise. General instructions  Take and apply over-the-counter and prescription medicines only as told by your health care provider.  If you were prescribed an antibiotic medicine, apply it or take it as told by your health care provider. Do not stop using the antibiotic even if your condition improves.  If your eyelids are affected, apply warm compresses to them. Do this as told by your health care provider.  Keep all follow-up visits as told by your health care provider. This is important. Contact a health care provider if:  Your symptoms get worse.  Your symptoms do not improve after 2 months of treatment.  You have new   symptoms.  You have any changes in vision or you have problems with your eyes, such as redness or itching.  You feel depressed.  You lose your appetite.  You have trouble concentrating. Summary  Rosacea is a long-term (chronic) condition that affects the skin of the face, including the cheeks, nose, forehead, and chin.  Take care of your skin as told by your health care provider.  Take and apply over-the-counter and prescription medicines only as told  by your health care provider.  Contact a health care provider if your symptoms get worse or if you have any changes in vision or other problems with your eyes, such as redness or itching.  Keep all follow-up visits as told by your health care provider. This is important. This information is not intended to replace advice given to you by your health care provider. Make sure you discuss any questions you have with your health care provider. Document Revised: 11/17/2017 Document Reviewed: 11/17/2017 Elsevier Patient Education  2020 Elsevier Inc.  

## 2019-09-01 NOTE — Assessment & Plan Note (Signed)
Previously well controlled, but uncontrolled on recent home readings and in office today She reports that she is taking losartan and metoprolol with good compliance and feels confident about this Continue these medications and add amlodipine 5 mg daily Continue to monitor home blood pressures

## 2019-09-01 NOTE — Progress Notes (Signed)
Patient: Barbara Chambers Female    DOB: 08-27-1934   85 y.o.   MRN: [id number] Visit Date: 09/01/2019  Today's Provider: Lavon Paganini, MD   Chief Complaint  Patient presents with  . Rash   Subjective:    I [place] S. Dimas, CMA, am acting as scribe for Lavon Paganini, MD.  HPI Patient here today C/O persistent rash on face and body. Patient was advised to use emollient twice daily, patient reports good compliance with cream. Patient reports rash unchanged. Patient reports some itching.    Allergies  Allergen Reactions  . Hctz [Hydrochlorothiazide]     Hyponatremia  . Penicillins Rash     Current Outpatient Medications:  .  aspirin 81 MG chewable tablet, Chew 81 mg by mouth daily., Disp: , Rfl:  .  COENZYME Q-10 PO, Take 1 capsule by mouth daily. , Disp: , Rfl:  .  DULoxetine (CYMBALTA) 20 MG capsule, Take 20 mg by mouth daily., Disp: , Rfl:  .  losartan (COZAAR) 100 MG tablet, TAKE 1 TABLET BY MOUTH EVERY DAY, Disp: 90 tablet, Rfl: 3 .  metoprolol succinate (TOPROL-XL) 50 MG 24 hr tablet, TAKE 1 TABLET BY MOUTH EVERY DAY, Disp: 90 tablet, Rfl: 1 .  OLANZapine (ZYPREXA) 2.5 MG tablet, TAKE 1 TABLET (2.5 MG TOTAL) BY MOUTH AT BEDTIME., Disp: 30 tablet, Rfl: 2 .  simvastatin (ZOCOR) 10 MG tablet, Take 1 tablet (10 mg total) by mouth at bedtime., Disp: 90 tablet, Rfl: 3  Review of Systems  Constitutional: Negative for activity change, appetite change, chills, diaphoresis, fatigue, fever and unexpected weight change.  Respiratory: Negative for cough and shortness of breath.   Cardiovascular: Negative for chest pain and palpitations.  Skin: Positive for rash.    Social History   Tobacco Use  . Smoking status: Never Smoker  . Smokeless tobacco: Never Used  Substance Use Topics  . Alcohol use: No      Objective:   BP (!) 162/79 (BP Location: Left Arm, Patient Position: Sitting, Cuff Size: Normal)   Pulse 72   Temp (!) 97.5 F (36.4 C) (Temporal)   Resp 16    Ht 5' (1.524 m)   Wt 143 lb (64.9 kg)   BMI 27.93 kg/m  Vitals:   09/01/19 1430 09/01/19 1433  BP: (!) 171/80 (!) 162/79  Pulse: 68 72  Resp: 16   Temp: (!) 97.5 F (36.4 C)   TempSrc: Temporal   Weight: 143 lb (64.9 kg)   Height: 5' (1.524 m)   Body mass index is 27.93 kg/m.   Physical Exam Vitals reviewed.  Constitutional:      General: She is not in acute distress.    Appearance: Normal appearance. She is not diaphoretic.  HENT:     Head: Normocephalic and atraumatic.  Cardiovascular:     Rate and Rhythm: Normal rate and regular rhythm.  Pulmonary:     Effort: Pulmonary effort is normal. No respiratory distress.  Musculoskeletal:     Right lower leg: No edema.     Left lower leg: No edema.  Skin:    General: Skin is warm and dry.     Comments: Erythematous rash of bilateral cheeks with some papular elements.  Multiple AK's over forearms  Neurological:     Mental Status: She is alert.      No results found for any visits on 09/01/19.     Assessment & Plan    Problem List Items Addressed This  Visit      Cardiovascular and Mediastinum   Benign essential HTN    Previously well controlled, but uncontrolled on recent home readings and in office today She reports that she is taking losartan and metoprolol with good compliance and feels confident about this Continue these medications and add amlodipine 5 mg daily Continue to monitor home blood pressures      Relevant Medications   amLODipine (NORVASC) 5 MG tablet     Musculoskeletal and Integument   Actinic keratosis    Longstanding issue She has previously had cryotherapy for them with her dermatologist Reassured her that these are not related to bug bites Encouraged her to follow-up with her dermatologist      Rosacea - Primary    New problem Rash on face is consistent with rosacea Start MetroGel Follow-up with her dermatologist          Return in about 4 weeks (around 09/29/2019) for  chronic disease f/u, as scheduled.   The entirety of the information documented in the History of Present Illness, Review of Systems and Physical Exam were personally obtained by me. Portions of this information were initially documented by Lynford Humphrey, CMA and reviewed by me for thoroughness and accuracy.    Mihcael Ledee, [name], MD MPH [facility]

## 2019-09-01 NOTE — Telephone Encounter (Signed)
Pt. Reports she has had a rash "for a couple of months now and this morning I noticed it is by my nose and getting close to my eyes." Moderate itching, no pain. Requests an appointment today. Scheduled.  Reason for Disposition . [1] Localized rash is very painful AND [2] no fever  Answer Assessment - Initial Assessment Questions 1. APPEARANCE of RASH: "Describe the rash."      Red 2. LOCATION: "Where is the rash located?"      Nose and going towards her eyes 3. NUMBER: "How many spots are there?"      Skin is red 4. SIZE: "How big are the spots?" (Inches, centimeters or compare to size of a coin)      Unsure 5. ONSET: "When did the rash start?"      Started months ago , "but getting worse." 6. ITCHING: "Does the rash itch?" If so, ask: "How bad is the itch?"  (Scale 1-10; or mild, moderate, severe)     Moderate 7. PAIN: "Does the rash hurt?" If so, ask: "How bad is the pain?"  (Scale 1-10; or mild, moderate, severe)     No 8. OTHER SYMPTOMS: "Do you have any other symptoms?" (e.g., fever)     No 9. PREGNANCY: "Is there any chance you are pregnant?" "When was your last menstrual period?"     No  Protocols used: RASH OR REDNESS - LOCALIZED-A-AH

## 2019-09-01 NOTE — Assessment & Plan Note (Signed)
Longstanding issue She has previously had cryotherapy for them with her dermatologist Reassured her that these are not related to bug bites Encouraged her to follow-up with her dermatologist

## 2019-09-02 ENCOUNTER — Ambulatory Visit: Payer: PPO | Attending: Internal Medicine

## 2019-09-02 DIAGNOSIS — Z23 Encounter for immunization: Secondary | ICD-10-CM

## 2019-09-02 NOTE — Progress Notes (Signed)
   Covid-19 Vaccination Clinic  Name:  SISSIE MARTUS    MRN: [id number] DOB: [birth year]  09/02/2019  Ms. Degeorge was observed post Covid-19 immunization for 15 minutes without incident. She was provided with Vaccine Information Sheet and instruction to access the V-Safe system.   Ms. Sardar was instructed to call 911 with any severe reactions post vaccine: [name] Difficulty breathing  . Swelling of face and throat  . A fast heartbeat  . A bad rash all over body  . Dizziness and weakness   Immunizations Administered    Name Date Dose VIS Date Route   Pfizer COVID-19 Vaccine 09/02/2019  8:59 AM 0.3 mL 06/09/2019 Intramuscular   Manufacturer: [place]   Lot: [id number]   [place]: [id number]

## 2019-09-28 ENCOUNTER — Encounter: Payer: Self-pay | Admitting: Family Medicine

## 2019-09-28 ENCOUNTER — Ambulatory Visit (INDEPENDENT_AMBULATORY_CARE_PROVIDER_SITE_OTHER): Payer: PPO | Admitting: Family Medicine

## 2019-09-28 ENCOUNTER — Other Ambulatory Visit: Payer: Self-pay

## 2019-09-28 VITALS — BP 124/65 | HR 68 | Temp 96.7°F | Wt 145.0 lb

## 2019-09-28 DIAGNOSIS — C9110 Chronic lymphocytic leukemia of B-cell type not having achieved remission: Secondary | ICD-10-CM

## 2019-09-28 DIAGNOSIS — E038 Other specified hypothyroidism: Secondary | ICD-10-CM

## 2019-09-28 DIAGNOSIS — I10 Essential (primary) hypertension: Secondary | ICD-10-CM

## 2019-09-28 DIAGNOSIS — F22 Delusional disorders: Secondary | ICD-10-CM | POA: Diagnosis not present

## 2019-09-28 DIAGNOSIS — D692 Other nonthrombocytopenic purpura: Secondary | ICD-10-CM

## 2019-09-28 DIAGNOSIS — Z0000 Encounter for general adult medical examination without abnormal findings: Secondary | ICD-10-CM

## 2019-09-28 DIAGNOSIS — E039 Hypothyroidism, unspecified: Secondary | ICD-10-CM | POA: Diagnosis not present

## 2019-09-28 DIAGNOSIS — R590 Localized enlarged lymph nodes: Secondary | ICD-10-CM | POA: Diagnosis not present

## 2019-09-28 DIAGNOSIS — E782 Mixed hyperlipidemia: Secondary | ICD-10-CM | POA: Diagnosis not present

## 2019-09-28 DIAGNOSIS — R413 Other amnesia: Secondary | ICD-10-CM

## 2019-09-28 NOTE — Patient Instructions (Signed)
Preventive Care 83 Years and Older, Female Preventive care refers to lifestyle choices and visits with your health care provider that can promote health and wellness. This includes:  A yearly physical exam. This is also called an annual well check.  Regular dental and eye exams.  Immunizations.  Screening for certain conditions.  Healthy lifestyle choices, such as diet and exercise. What can I expect for my preventive care visit? Physical exam Your health care provider will check:  Height and weight. These may be used to calculate body mass index (BMI), which is a measurement that tells if you are at a healthy weight.  Heart rate and blood pressure.  Your skin for abnormal spots. Counseling Your health care provider may ask you questions about:  Alcohol, tobacco, and drug use.  Emotional well-being.  Home and relationship well-being.  Sexual activity.  Eating habits.  History of falls.  Memory and ability to understand (cognition).  Work and work Statistician.  Pregnancy and menstrual history. What immunizations do I need?  Influenza (flu) vaccine  This is recommended every year. Tetanus, diphtheria, and pertussis (Tdap) vaccine  You may need a Td booster every 10 years. Varicella (chickenpox) vaccine  You may need this vaccine if you have not already been vaccinated. Zoster (shingles) vaccine  You may need this after age 33. Pneumococcal conjugate (PCV13) vaccine  One dose is recommended after age 33. Pneumococcal polysaccharide (PPSV23) vaccine  One dose is recommended after age 72. Measles, mumps, and rubella (MMR) vaccine  You may need at least one dose of MMR if you were born in 1957 or later. You may also need a second dose. Meningococcal conjugate (MenACWY) vaccine  You may need this if you have certain conditions. Hepatitis A vaccine  You may need this if you have certain conditions or if you travel or work in places where you may be exposed  to hepatitis A. Hepatitis B vaccine  You may need this if you have certain conditions or if you travel or work in places where you may be exposed to hepatitis B. Haemophilus influenzae type b (Hib) vaccine  You may need this if you have certain conditions. You may receive vaccines as individual doses or as more than one vaccine together in one shot (combination vaccines). Talk with your health care provider about the risks and benefits of combination vaccines. What tests do I need? Blood tests  Lipid and cholesterol levels. These may be checked every 5 years, or more frequently depending on your overall health.  Hepatitis C test.  Hepatitis B test. Screening  Lung cancer screening. You may have this screening every year starting at age 39 if you have a 30-pack-year history of smoking and currently smoke or have quit within the past 15 years.  Colorectal cancer screening. All adults should have this screening starting at age 36 and continuing until age 15. Your health care provider may recommend screening at age 23 if you are at increased risk. You will have tests every 1-10 years, depending on your results and the type of screening test.  Diabetes screening. This is done by checking your blood sugar (glucose) after you have not eaten for a while (fasting). You may have this done every 1-3 years.  Mammogram. This may be done every 1-2 years. Talk with your health care provider about how often you should have regular mammograms.  BRCA-related cancer screening. This may be done if you have a family history of breast, ovarian, tubal, or peritoneal cancers.  Other tests  Sexually transmitted disease (STD) testing.  Bone density scan. This is done to screen for osteoporosis. You may have this done starting at age 44. Follow these instructions at home: Eating and drinking  Eat a diet that includes fresh fruits and vegetables, whole grains, lean protein, and low-fat dairy products. Limit  your intake of foods with high amounts of sugar, saturated fats, and salt.  Take vitamin and mineral supplements as recommended by your health care provider.  Do not drink alcohol if your health care provider tells you not to drink.  If you drink alcohol: ? Limit how much you have to 0-1 drink a day. ? Be aware of how much alcohol is in your drink. In the [place] one drink equals one 12 oz bottle of beer (355 mL), one 5 oz glass of wine (148 mL), or one 1 oz glass of hard liquor (44 mL). Lifestyle  Take daily care of your teeth and gums.  Stay active. Exercise for at least 30 minutes on 5 or more days each week.  Do not use any products that contain nicotine or tobacco, such as cigarettes, e-cigarettes, and chewing tobacco. If you need help quitting, ask your health care provider.  If you are sexually active, practice safe sex. Use a condom or other form of protection in order to prevent STIs (sexually transmitted infections).  Talk with your health care provider about taking a low-dose aspirin or statin. What's next?  Go to your health care provider once a year for a well check visit.  Ask your health care provider how often you should have your eyes and teeth checked.  Stay up to date on all vaccines. This information is not intended to replace advice given to you by your health care provider. Make sure you discuss any questions you have with your health care provider. Document Revised: 06/09/2018 Document Reviewed: 06/09/2018 Elsevier Patient Education  2020 [employer].

## 2019-09-28 NOTE — Assessment & Plan Note (Signed)
Well controlled Continue current medications Recheck metabolic panel F/u in 6 months  

## 2019-09-28 NOTE — Assessment & Plan Note (Signed)
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

## 2019-09-28 NOTE — Progress Notes (Signed)
Patient: Barbara Chambers, Female    DOB: 07/03/1934, 85 y.o.   MRN: [id number] Visit Date: 09/28/2019  Today's Provider: Lavon Paganini, MD   Chief Complaint  Patient presents with  . Annual Exam   Subjective:     Complete Physical Barbara Chambers is a 85 y.o. female. She feels well. She reports exercising some. She reports she is sleeping well.  -----------------------------------------------------------   Review of Systems  Constitutional: Negative.   HENT: Negative.   Eyes: Negative.   Respiratory: Negative.   Cardiovascular: Negative.   Gastrointestinal: Negative.   Endocrine: Negative.   Genitourinary: Negative.   Musculoskeletal: Negative.   Allergic/Immunologic: Negative.   Neurological: Negative.   Hematological: Negative.   Psychiatric/Behavioral: Negative.     Social History   Socioeconomic History  . Marital status: Widowed    Spouse name: Casandra Doffing  . Number of children: 3  . Years of education: Not on file  . Highest education level: 12th grade  Occupational History  . Occupation: retired  Tobacco Use  . Smoking status: Never Smoker  . Smokeless tobacco: Never Used  Substance and Sexual Activity  . Alcohol use: No  . Drug use: No  . Sexual activity: Not on file  Other Topics Concern  . Not on file  Social History Narrative   Pt has 1 son that was killed in a car accident at age 51.    [name] is primary caregiver   Social Determinants of Health   Financial Resource Strain: Low Risk   . Difficulty of Paying Living Expenses: Not hard at all  Food Insecurity: No Food Insecurity  . Worried About Charity fundraiser in the Last Year: Never true  . Ran Out of Food in the Last Year: Never true  Transportation Needs: No Transportation Needs  . Lack of Transportation (Medical): No  . Lack of Transportation (Non-Medical): No  Physical Activity: Inactive  . Days of Exercise per Week: 0 days  . Minutes of Exercise per Session: 0 min  Stress: No  Stress Concern Present  . Feeling of Stress : Not at all  Social Connections: Slightly Isolated  . Frequency of Communication with Friends and Family: More than three times a week  . Frequency of Social Gatherings with Friends and Family: More than three times a week  . Attends Religious Services: More than 4 times per year  . Active Member of Clubs or Organizations: Yes  . Attends Archivist Meetings: More than 4 times per year  . Marital Status: Widowed  Intimate Partner Violence: Not At Risk  . Fear of Current or Ex-Partner: No  . Emotionally Abused: No  . Physically Abused: No  . Sexually Abused: No    Past Medical History:  Diagnosis Date  . Cancer [facility]) 2011   lymphoma  . Hyperlipidemia   . Hypertension      Patient Active Problem List   Diagnosis Date Noted  . Actinic keratosis 09/01/2019  . Rosacea 09/01/2019  . UTI (urinary tract infection) 03/03/2019  . Hyponatremia 03/03/2019  . Adjustment disorder with mixed anxiety and depressed mood 03/01/2019  . Obsessional thoughts 03/01/2019  . Delusional disorder ([place]) 03/01/2019  . Shortness of breath 07/11/2018  . Vulvar irritation 07/11/2018  . Dizziness 05/18/2017  . Leg cramps 04/20/2017  . Arthritis 03/08/2015  . Anxiety 10/25/2014  . CLL (chronic lymphocytic leukemia) ([place]) 10/25/2014  . HLD (hyperlipidemia) 10/25/2014  . Subclinical hypothyroidism 10/25/2014  . Abnormal ECG  10/11/2014  . Benign essential HTN 10/11/2014    Past Surgical History:  Procedure Laterality Date  . cataract surgery Bilateral 2005  . CHOLECYSTECTOMY  1991  . RECONSTRUCTION OF EYELID      Her family history includes CAD in her mother; Heart attack in her father; Hypertension in her brother, father, mother, and son; Seizures in her brother; Stroke in her mother. There is no history of Breast cancer.   Current Outpatient Medications:  .  amLODipine (NORVASC) 5 MG tablet, Take 1 tablet (5 mg total) by mouth daily., Disp:  30 tablet, Rfl: 3 .  aspirin 81 MG chewable tablet, Chew 81 mg by mouth daily., Disp: , Rfl:  .  COENZYME Q-10 PO, Take 1 capsule by mouth daily. , Disp: , Rfl:  .  DULoxetine (CYMBALTA) 20 MG capsule, Take 20 mg by mouth daily., Disp: , Rfl:  .  losartan (COZAAR) 100 MG tablet, TAKE 1 TABLET BY MOUTH EVERY DAY, Disp: 90 tablet, Rfl: 3 .  metoprolol succinate (TOPROL-XL) 50 MG 24 hr tablet, TAKE 1 TABLET BY MOUTH EVERY DAY, Disp: 90 tablet, Rfl: 1 .  OLANZapine (ZYPREXA) 2.5 MG tablet, TAKE 1 TABLET (2.5 MG TOTAL) BY MOUTH AT BEDTIME., Disp: 30 tablet, Rfl: 2 .  simvastatin (ZOCOR) 10 MG tablet, Take 1 tablet (10 mg total) by mouth at bedtime., Disp: 90 tablet, Rfl: 3 .  metroNIDAZOLE (METROGEL) 0.75 % gel, Apply 1 application topically daily. For face and neck (Patient not taking: Reported on 09/28/2019), Disp: 45 g, Rfl: 0  Patient Care Team: [name], MD as PCP - General (Family Medicine) [name] Kathlene November, MD as Consulting Physician (Oncology) Corey Skains, MD as Consulting Physician (Cardiology) Brendolyn Patty, MD as Consulting Physician (Dermatology) Birder Robson, MD as Referring Physician (Ophthalmology)     Objective:    Vitals: BP 124/65 (BP Location: Left Arm, Patient Position: Sitting, Cuff Size: Normal)   Pulse 68   Temp (!) 96.7 F (35.9 C) (Temporal)   Wt 145 lb (65.8 kg)   BMI 28.32 kg/m   Physical Exam Vitals reviewed.  Constitutional:      General: She is not in acute distress.    Appearance: Normal appearance. She is well-developed. She is not diaphoretic.  HENT:     Head: Normocephalic and atraumatic.     Right Ear: Tympanic membrane, ear canal and external ear normal.     Left Ear: Tympanic membrane, ear canal and external ear normal.     Nose: Nose normal.     Mouth/Throat:     Mouth: Mucous membranes are moist.     Pharynx: Oropharynx is clear. No oropharyngeal exudate.  Eyes:     General: No scleral icterus.    Extraocular  Movements: Extraocular movements intact.     Conjunctiva/sclera: Conjunctivae normal.     Pupils: Pupils are equal, round, and reactive to light.  Neck:     Thyroid: No thyromegaly.  Cardiovascular:     Rate and Rhythm: Normal rate and regular rhythm.     Heart sounds: Normal heart sounds. No murmur.  Pulmonary:     Effort: Pulmonary effort is normal. No respiratory distress.     Breath sounds: Normal breath sounds. No wheezing or rales.  Abdominal:     General: There is no distension.     Palpations: Abdomen is soft.     Tenderness: There is no abdominal tenderness. There is no guarding or rebound.  Musculoskeletal:  General: No deformity.     Cervical back: Neck supple.     Right lower leg: No edema.     Left lower leg: No edema.  Lymphadenopathy:     Cervical: Cervical adenopathy (large R anterior cervical fullness, concernign for LAD, slight fullness on Left side, but less than R) present.  Skin:    General: Skin is warm and dry.     Capillary Refill: Capillary refill takes less than 2 seconds.     Findings: No rash.  Neurological:     Mental Status: She is alert and oriented to person, place, and time.     Cranial Nerves: No cranial nerve deficit.     Sensory: No sensory deficit.     Motor: No weakness.  Psychiatric:        Mood and Affect: Mood normal.        Behavior: Behavior normal.        Thought Content: Thought content normal.     Activities of Daily Living In your present state of health, do you have any difficulty performing the following activities: 07/13/2019 03/04/2019  Hearing? N N  Vision? N N  Difficulty concentrating or making decisions? N N  Walking or climbing stairs? N Y  Dressing or bathing? N Y  Doing errands, shopping? N N  Preparing Food and eating ? N -  Using the Toilet? N -  In the past six months, have you accidently leaked urine? Y -  Comment Occasionally with urges. -  Do you have problems with loss of bowel control? N -    Managing your Medications? N -  Managing your Finances? N -  Housekeeping or managing your Housekeeping? N -  Some recent data might be hidden    Fall Risk Assessment Fall Risk  07/13/2019 07/11/2018 07/08/2017 06/24/2016 12/26/2014  Falls in the past year? 1 1 No Yes No  Number falls in past yr: 0 0 - 1 -  Comment - - - tripped -  Injury with Fall? 0 0 - No -  Comment - - - bruising -  Follow up Falls prevention discussed Falls prevention discussed - Falls prevention discussed -     Depression Screen PHQ 2/9 Scores 09/01/2019 07/13/2019 05/11/2019 07/11/2018  PHQ - 2 Score 0 0 0 0  PHQ- 9 Score 2 - 0 -    6CIT Screen 01/20/2019  What Year? 0 points  What month? 0 points  What time? 0 points  Count back from 20 0 points  Months in reverse 0 points  Repeat phrase 0 points  Total Score 0       Assessment & Plan:    Annual Physical Reviewed patient's Family Medical History Reviewed and updated list of patient's medical providers Assessment of cognitive impairment was done Assessed patient's functional ability Established a written schedule for health screening [place] Completed and Reviewed  Exercise Activities and Dietary recommendations Goals    . "I need to make sure mom follows up with a psychiatrist" (pt-stated)     Current Barriers:  [name] Mental Health Concerns   Clinical Social Work Clinical Goal(s):  [name] Over the next 30 days, client will follow up with [facility] as directed by patient's provider to address continued delusion that bed bugs remain in her home  Interventions: . [name] who is on DPR  interviewed and appropriate assessments performed . Followed up on  referral to [facility] to address delusional disorder . Confirmed that patient  appointment scheduled for 03/28/19 with [name] ANP-C for medication assessment was kept . Patient's EMR confirmed that patient does not have a UTI and she can now stop  the antibiotics . Encouraged patient's son to continue to support patient with ongoing mental health follow up with [facility] for medication management. . [name] provided  with direct contact information for this social worker in the event that any community resource needs arise   Patient Self Care Activities:  . Attends all scheduled provider appointments . Performs ADL's independently . Unable to perform IADLs independently  Please see past updates related to this goal by clicking on the "Past Updates" button in the selected goal      . Exercise     Starting 06/24/16, I will start back walking 4 days a week for 45 minutes.    . Exercise     Pt plans to start back walking 5 days a week for 1 hour (3 miles).     [name] LIFESTYLE - DECREASE FALLS RISK     Recommend to drink at least 6-8 8oz glasses of water per day.     . Prevent falls     Recommend to remove any items from the home that may cause slips or trips.       Immunization History  Administered Date(s) Administered  . Fluad Quad(high Dose 65+) 03/17/2019  . Influenza, High Dose Seasonal PF 06/24/2016, 04/20/2017, 05/04/2018  . PFIZER SARS-COV-2 Vaccination 09/02/2019  . Pneumococcal Conjugate-13 03/30/2014  . Pneumococcal Polysaccharide-23 04/20/2017  . Td 04/05/2017  . Tdap 07/14/2005    Health Maintenance  Topic Date Due  . INFLUENZA VACCINE  01/28/2020  . TETANUS/TDAP  04/06/2027  . DEXA SCAN  Completed  . PNA vac Low Risk Adult  Completed     Discussed health benefits of physical activity, and encouraged her to engage in regular exercise appropriate for her age and condition.    ------------------------------------------------------------------------------------------------------------  Problem List Items Addressed This Visit      Cardiovascular and Mediastinum   Benign essential HTN    Well controlled Continue current medications Recheck metabolic panel F/u in 6 months        Relevant Orders   Comprehensive metabolic panel   Senile purpura ([place])    Continue to monitor        Endocrine   Subclinical hypothyroidism    Asymptomatic Not on medications Recheck TSH and free T4      Relevant Orders   TSH + free T4     Immune and Lymphatic   Cervical adenopathy    As below, current concerning in the setting of CLL Patient reports is been going on for few months, but unclear about her timeline recall Ultrasound as below Recheck CBC Follow-up with hematology      Relevant Orders   US Soft Tissue Head/Neck (NON-THYROID)     Other   CLL (chronic lymphocytic leukemia) ([place])    Thought to be CLL variant Followed by Oncology New R>L Cervical adenopathy Will get [place] to evaluate further Advised to f/u with Oncology as well      Relevant Orders   CBC   US Soft Tissue Head/Neck (NON-THYROID)   HLD (hyperlipidemia)    Continue Zocor Recheck FLP and CMP today      Relevant Orders   Lipid panel   Comprehensive metabolic panel   Delusional disorder ([place])    Seems to be having some more paranoia again Son is concerned that she  is not taking her Zyprexa as prescribed With new memory loss, concerned for possible dementia variant Referral to neurology as below      Relevant Orders   Ambulatory referral to Neurology   Memory loss    Seems to have more confusion today She has COVID19 vaccine card with her and first vaccine is listed as per patient at [facility] clinic is not giving COVID19 vaccines at this time and state that on that date she had a COVID19 test, not vaccine Spoke to her son, Barbara Chambers, who reports he has noticed worsening lately as well We have discussed possibility of seeing Neurology in the past and will go ahead with referral today      Relevant Orders   Ambulatory referral to Neurology    Other Visit Diagnoses    Encounter for annual physical exam    -  Primary   Relevant Orders   Lipid panel   Comprehensive metabolic panel    CBC   TSH + free T4       Return in about 6 months (around 03/29/2020) for chronic disease f/u.   The entirety of the information documented in the History of Present Illness, Review of Systems and Physical Exam were personally obtained by me. Portions of this information were initially documented by Ashley Royalty, CMA and reviewed by me for thoroughness and accuracy.    Mariyana Fulop, [name], MD MPH [facility]

## 2019-09-28 NOTE — Assessment & Plan Note (Signed)
As below, current concerning in the setting of CLL Patient reports is been going on for few months, but unclear about her timeline recall Ultrasound as below Recheck CBC Follow-up with hematology

## 2019-09-28 NOTE — Assessment & Plan Note (Signed)
Seems to be having some more paranoia again Son is concerned that she is not taking her Zyprexa as prescribed With new memory loss, concerned for possible dementia variant Referral to neurology as below

## 2019-09-28 NOTE — Assessment & Plan Note (Signed)
Continue Zocor Recheck FLP and CMP today

## 2019-09-28 NOTE — Assessment & Plan Note (Signed)
Seems to have more confusion today She has COVID19 vaccine card with her and first vaccine is listed as per patient at [facility] clinic is not giving COVID19 vaccines at this time and state that on that date she had a COVID19 test, not vaccine Spoke to her son, [name], who reports he has noticed worsening lately as well We have discussed possibility of seeing Neurology in the past and will go ahead with referral today

## 2019-09-28 NOTE — Assessment & Plan Note (Signed)
Asymptomatic Not on medications Recheck TSH and free T4 

## 2019-09-28 NOTE — Assessment & Plan Note (Signed)
Continue to monitor

## 2019-09-29 ENCOUNTER — Telehealth: Payer: Self-pay

## 2019-09-29 LAB — CBC
Hematocrit: 41.2 % (ref 34.0–46.6)
Hemoglobin: 12.9 g/dL (ref 11.1–15.9)
MCH: 27.1 pg (ref 26.6–33.0)
MCHC: 31.3 g/dL — ABNORMAL LOW (ref 31.5–35.7)
MCV: 87 fL (ref 79–97)
Platelets: 262 10*3/uL (ref 150–450)
RBC: 4.76 x10E6/uL (ref 3.77–5.28)
RDW: 13.7 % (ref 11.7–15.4)
WBC: 29.4 10*3/uL (ref 3.4–10.8)

## 2019-09-29 LAB — COMPREHENSIVE METABOLIC PANEL
ALT: 15 IU/L (ref 0–32)
AST: 20 IU/L (ref 0–40)
Albumin/Globulin Ratio: 2 (ref 1.2–2.2)
Albumin: 4.1 g/dL (ref 3.6–4.6)
Alkaline Phosphatase: 117 IU/L (ref 39–117)
BUN/Creatinine Ratio: 20 (ref 12–28)
BUN: 16 mg/dL (ref 8–27)
Bilirubin Total: 0.4 mg/dL (ref 0.0–1.2)
CO2: 23 mmol/L (ref 20–29)
Calcium: 9.4 mg/dL (ref 8.7–10.3)
Chloride: 103 mmol/L (ref 96–106)
Creatinine, Ser: 0.79 mg/dL (ref 0.57–1.00)
GFR calc Af Amer: 81 mL/min/{1.73_m2} (ref >59)
GFR calc non Af Amer: 70 mL/min/{1.73_m2} (ref >59)
Globulin, Total: 2.1 g/dL (ref 1.5–4.5)
Glucose: 96 mg/dL (ref 65–99)
Potassium: 4.8 mmol/L (ref 3.5–5.2)
Sodium: 141 mmol/L (ref 134–144)
Total Protein: 6.2 g/dL (ref 6.0–8.5)

## 2019-09-29 LAB — LIPID PANEL
Chol/HDL Ratio: 3.6 ratio (ref 0.0–4.4)
Cholesterol, Total: 179 mg/dL (ref 100–199)
HDL: 50 mg/dL (ref >39)
LDL Chol Calc (NIH): 108 mg/dL — ABNORMAL HIGH (ref 0–99)
Triglycerides: 119 mg/dL (ref 0–149)
VLDL Cholesterol Cal: 21 mg/dL (ref 5–40)

## 2019-09-29 LAB — TSH+FREE T4
Free T4: 0.92 ng/dL (ref 0.82–1.77)
TSH: 4.97 u[IU]/mL — ABNORMAL HIGH (ref 0.450–4.500)

## 2019-09-29 NOTE — Telephone Encounter (Signed)
-----   Message from [name], MD sent at 09/29/2019 11:35 AM EDT ----- Normal/stable labs.  White blood cell count has elevated slightly from last visit, but is similar to what it was less than a year ago.

## 2019-09-29 NOTE — Telephone Encounter (Signed)
Tried calling, pt's voicemail is not set up.  PEC please advise pt of lab results below.    Thanks,   -Mickel Baas

## 2019-10-02 ENCOUNTER — Ambulatory Visit
Admission: RE | Admit: 2019-10-02 | Discharge: 2019-10-02 | Disposition: A | Discharge status: Home / Self Care | Payer: PPO | Source: Ambulatory Visit | Attending: Family Medicine | Admitting: Family Medicine

## 2019-10-02 ENCOUNTER — Other Ambulatory Visit: Payer: Self-pay

## 2019-10-02 DIAGNOSIS — R599 Enlarged lymph nodes, unspecified: Secondary | ICD-10-CM | POA: Diagnosis not present

## 2019-10-02 DIAGNOSIS — R590 Localized enlarged lymph nodes: Secondary | ICD-10-CM | POA: Diagnosis not present

## 2019-10-02 DIAGNOSIS — C9110 Chronic lymphocytic leukemia of B-cell type not having achieved remission: Secondary | ICD-10-CM | POA: Diagnosis not present

## 2019-10-02 DIAGNOSIS — R221 Localized swelling, mass and lump, neck: Secondary | ICD-10-CM | POA: Diagnosis not present

## 2019-10-02 NOTE — Telephone Encounter (Signed)
Attempted to contact pt on cell phone; received rapid busy signal; will attempt to contact pt on home phone.

## 2019-10-02 NOTE — Telephone Encounter (Signed)
Pt given results per Dr [name]; she verbalized understanding.

## 2019-10-03 ENCOUNTER — Telehealth: Payer: Self-pay | Admitting: Emergency Medicine

## 2019-10-03 ENCOUNTER — Other Ambulatory Visit: Payer: Self-pay | Admitting: Emergency Medicine

## 2019-10-03 DIAGNOSIS — C9110 Chronic lymphocytic leukemia of B-cell type not having achieved remission: Secondary | ICD-10-CM

## 2019-10-03 NOTE — Telephone Encounter (Signed)
Attempted to call pt to let her know that we would be ordering CT of chest, abd, and pelvis due to results of neck US ordered by PCP. No answer and voicemail not set up. Will attempt to call again later. As writing this note, pt called back. Explained to pt why CTs were being ordered, pt verbalized understanding, had no questions. Explained to pt that scheduler would be calling today to get her set up for CTs, labs, and to see Dr. [name].

## 2019-10-03 NOTE — Progress Notes (Signed)
Ct

## 2019-10-03 NOTE — Telephone Encounter (Signed)
Thank you Clarise Cruz. i'll get pt scheduled.  Brooke

## 2019-10-13 NOTE — Progress Notes (Signed)
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  INTERVAL HISTORY: Patient returns to clinic today for further evaluation and discussion of her imaging results.  Over the past several months she has noted increased growth in size of lymph node in her right neck.  Ultrasound completed by primary care confirmed.  Subsequent CT scan of the chest, abdomen, pelvis revealed widespread lymphadenopathy.  Patient son reports she is having increasing issues with memory.  Patient states that she feels increased weakness and fatigue, but otherwise feels well. She denies any fevers, night sweats, or weight loss. She has no neurologic complaints.  She denies any chest pain, shortness of breath, cough, or hemoptysis.  She denies any nausea, vomiting, constipation, or diarrhea. She has no urinary complaints.  Patient offers no further specific complaints today.  REVIEW OF SYSTEMS:   Review of Systems  Constitutional: Positive for malaise/fatigue. Negative for diaphoresis, fever and weight loss.  Respiratory: Negative.  Negative for cough and shortness of breath.   Cardiovascular: Negative.  Negative for chest pain and leg swelling.  Gastrointestinal: Negative.  Negative for abdominal pain.  Genitourinary: Negative.  Negative for dysuria.  Musculoskeletal: Negative.  Negative for back pain.  Skin: Negative.  Negative for rash.  Neurological: Positive for weakness. Negative for dizziness, sensory change, focal weakness  and headaches.  Psychiatric/Behavioral: Positive for memory loss. Negative for depression. The patient is not nervous/anxious.     As per HPI. Otherwise, a complete review of systems is negative.  PAST MEDICAL HISTORY: Past Medical History:  Diagnosis Date  . Chronic lymphocytic leukemia ([place]) 2011  . Hyperlipidemia   . Hypertension     PAST SURGICAL HISTORY: Past Surgical History:  Procedure Laterality Date  . cataract surgery Bilateral 2005  . CHOLECYSTECTOMY  1991  . RECONSTRUCTION OF EYELID      FAMILY HISTORY Family History  Problem Relation Age of Onset  . Hypertension Mother   . Stroke Mother   . CAD Mother   . Hypertension Father   . Heart attack Father   . Hypertension Brother   . Seizures Brother   . Hypertension Son   . Breast cancer Neg Hx     GYNECOLOGIC HISTORY:  No LMP recorded. Patient is postmenopausal.     ADVANCED DIRECTIVES:    HEALTH MAINTENANCE: Social History   Tobacco Use  . Smoking status: Never Smoker  . Smokeless tobacco: Never Used  Substance Use Topics  . Alcohol use: No  . Drug use: No     Colonoscopy:  PAP:  Bone density:  Lipid panel:  Allergies  Allergen Reactions  . Hctz [Hydrochlorothiazide]     Hyponatremia  . Penicillins Rash    Current Outpatient Medications  Medication Sig Dispense Refill  . amLODipine (NORVASC) 5 MG tablet Take 1 tablet (5 mg total) by mouth daily. 30 tablet 3  . aspirin 81 MG chewable tablet Chew 81 mg by mouth daily.    [name] COENZYME Q-10 PO Take 1 capsule by mouth daily.     . DULoxetine (CYMBALTA) 20 MG capsule Take 20  mg by mouth daily.    [name] losartan (COZAAR) 100 MG tablet TAKE 1 TABLET BY MOUTH EVERY DAY 90 tablet 3  . metoprolol succinate (TOPROL-XL) 50 MG 24 hr tablet TAKE 1 TABLET BY MOUTH EVERY DAY 90 tablet 1  . OLANZapine (ZYPREXA) 2.5 MG tablet TAKE 1 TABLET (2.5 MG TOTAL) BY MOUTH AT BEDTIME. 30 tablet 2  . simvastatin (ZOCOR) 10 MG tablet Take 1 tablet (10 mg total) by mouth  at bedtime. 90 tablet 3  . metroNIDAZOLE (METROGEL) 0.75 % gel Apply 1 application topically daily. For face and neck (Patient not taking: Reported on 10/18/2019) 45 g 0   No current facility-administered medications for this visit.    OBJECTIVE: BP (!) 165/61 (BP Location: Left Arm, Patient Position: Sitting)   Pulse 81   Temp 98 F (36.7 C) (Tympanic)   Resp 18   Wt 144 lb (65.3 kg)   SpO2 100%   BMI 28.12 kg/m    Body mass index is 28.12 kg/m.    ECOG FS:0 - Asymptomatic   General: Well-developed, well-nourished, no acute distress. Eyes: Pink conjunctiva, anicteric sclera. HEENT: Normocephalic, moist mucous membranes.  Easily palpable right cervical lymph node. Lungs: No audible wheezing or coughing. Heart: Regular rate and rhythm. Abdomen: Soft, nontender, no obvious distention. Musculoskeletal: No edema, cyanosis, or clubbing. Neuro: Alert, answering all questions appropriately. Cranial nerves grossly intact. Skin: No rashes or petechiae noted. Psych: Normal affect.    LAB RESULTS:  Appointment on 10/19/2019  Component Date Value Ref Range Status  . Sodium 10/19/2019 141  135 - 145 mmol/L Final  . Potassium 10/19/2019 4.5  3.5 - 5.1 mmol/L Final  . Chloride 10/19/2019 104  98 - 111 mmol/L Final  . CO2 10/19/2019 28  22 - 32 mmol/L Final  . Glucose, Bld 10/19/2019 101* 70 - 99 mg/dL Final   Glucose reference range applies only to samples taken after fasting for at least 8 hours.  . BUN 10/19/2019 15  8 - 23 mg/dL Final  . Creatinine, Ser 10/19/2019 0.75  0.44 - 1.00 mg/dL Final  . Calcium 10/19/2019 9.2  8.9 - 10.3 mg/dL Final  . Total Protein 10/19/2019 7.4  6.5 - 8.1 g/dL Final  . Albumin 10/19/2019 4.3  3.5 - 5.0 g/dL Final  . AST 10/19/2019 24  15 - 41 U/L Final  . ALT 10/19/2019 17  0 - 44 U/L Final  . Alkaline Phosphatase 10/19/2019 109  38 - 126 U/L Final  . Total Bilirubin 10/19/2019 0.6  0.3 - 1.2 mg/dL Final  . GFR calc non Af Amer 10/19/2019 >60  >60  mL/min Final  . GFR calc Af Amer 10/19/2019 >60  >60 mL/min Final  . Anion gap 10/19/2019 9  5 - 15 Final   Performed at [facility], [address], [place], [place] [address]  . WBC 10/19/2019 35.6* 4.0 - 10.5 K/uL Final  . RBC 10/19/2019 5.07  3.87 - 5.11 MIL/uL Final  . Hemoglobin 10/19/2019 13.5  12.0 - 15.0 g/dL Final  . HCT 10/19/2019 43.0  36.0 - 46.0 % Final  . MCV 10/19/2019 84.8  80.0 - 100.0 fL Final  . MCH 10/19/2019 26.6  26.0 - 34.0 pg Final  . MCHC 10/19/2019 31.4  30.0 - 36.0 g/dL Final  . RDW 10/19/2019 14.1  11.5 - 15.5 % Final  . Platelets 10/19/2019 329  150 - 400 K/uL Final  . nRBC 10/19/2019 0.0  0.0 - 0.2 % Final  .  Neutrophils Relative % 10/19/2019 19  % Final  . Neutro Abs 10/19/2019 6.8  1.7 - 7.7 K/uL Final  . Lymphocytes Relative 10/19/2019 78  % Final  . Lymphs Abs 10/19/2019 27.8* 0.7 - 4.0 K/uL Final  . Monocytes Relative 10/19/2019 3  % Final  . Monocytes Absolute 10/19/2019 1.1* 0.1 - 1.0 K/uL Final  . Eosinophils Relative 10/19/2019 0  % Final  . Eosinophils Absolute 10/19/2019 0.0  0.0 - 0.5 K/uL Final  . Basophils Relative 10/19/2019 0  % Final  . Basophils Absolute 10/19/2019 0.0  0.0 - 0.1 K/uL Final  . WBC Morphology 10/19/2019 Abnormal lymphocytes present   Final   DIFF CONFIRMED BY MANUAL. CONSISTANT WITH KNOWN CLL  . RBC Morphology 10/19/2019 UNREMARKABLE   Final  . Smear Review 10/19/2019 Normal platelet morphology   Final   PLATELETS APPEAR ADEQUATE  . Abs Immature Granulocytes 10/19/2019 0.00  0.00 - 0.07 K/uL Final   Performed at [facility], [address] [place], [place] [address]    STUDIES: CT Chest W Contrast  Result Date: 10/18/2019 CLINICAL DATA:  History of lymphoma. Lymphadenopathy. Bilateral lower extremity edema. EXAM: CT CHEST, ABDOMEN, AND PELVIS WITH CONTRAST TECHNIQUE: Multidetector CT imaging of the chest, abdomen and pelvis was performed following the standard protocol during bolus administration of  intravenous contrast. CONTRAST:  32mL OMNIPAQUE IOHEXOL 300 MG/ML  SOLN COMPARISON:  06/18/2010. FINDINGS: CT CHEST FINDINGS Cardiovascular: Atherosclerotic calcification of the aorta. Heart size is normal. No pericardial effusion. Mediastinum/Nodes: Low internal jugular and anterior scalene lymph nodes measure up to 9 mm on the right, new from 06/18/2010. Mediastinal adenopathy measures up to 1.4 cm in the low left paratracheal station, increased from 5 mm on 06/18/2010. Bulky bilateral axillary and subpectoral adenopathy. Index left axillary lymph node measures 2.2 cm (3/14), enlarged from 9 mm. Esophagus is mildly dilated and contains fluid. Lungs/Pleura: Mild mosaic attenuation in the lungs, nonspecific. Multiple scattered tiny pulmonary nodules measure 4 mm or less in size and appear largely new from 06/18/2010. 8 x 11 mm nodule in the medial segment right middle lobe (4/78) is unchanged from 06/18/2010 and considered benign. Scattered volume loss in the right middle lobe, lingula and both lower lobes. No pleural fluid. Airway is unremarkable. Musculoskeletal: No worrisome lytic or sclerotic lesions. CT ABDOMEN PELVIS FINDINGS Hepatobiliary: Liver is unremarkable. Cholecystectomy. No biliary ductal dilatation. Pancreas: Negative. Spleen: Contains subcentimeter low-attenuation lesions which are too small to characterize. Normal in size. Adrenals/Urinary Tract: Slight nodularity of the lateral limb right adrenal gland. There may be a small gastric diverticulum, mimicking a left adrenal nodule (3/52-53). Left adrenal gland is otherwise unremarkable. Low-attenuation lesions in the right kidney measure up to 1.7 cm and are likely cysts. Ureters are decompressed. Bladder is grossly unremarkable. Stomach/Bowel: Stomach, small bowel, appendix and colon are unremarkable. Vascular/Lymphatic: Atherosclerotic calcification of the aorta without aneurysm. Gastrohepatic ligament lymph nodes measure up to 10 mm, enlarged from  6 mm. Periportal lymph nodes measure up to 2.1 cm (3/58), enlarged from 1.2 cm. Abdominal retroperitoneal lymph nodes measure up to 8 mm in the aortocaval station (3/62), increased from 5 mm. Bilateral iliac chain lymph nodes measure up to 11 mm in the right external iliac chain (3/97), previously 7 mm. Inguinal adenopathy measures up to 9 mm on the right, previously 5 mm. Small bowel mesenteric lymph nodes measure up to 8 mm in the ileocolic mesentery (123456), increased from 6 mm. Reproductive: Uterus is visualized and likely contains internal an exophytic  low-attenuation lesions, measuring up to 1.8 cm, likely fibroids. Other: No free fluid. Mesenteries and peritoneum are otherwise unremarkable. Musculoskeletal: Degenerative changes in the spine. No worrisome lytic or sclerotic lesions. IMPRESSION: 1. Adenopathy throughout the low neck, chest, abdomen and pelvis is most indicative of recurrent lymphoma. Spleen is normal in size. 2. Tiny scattered pulmonary nodules appear largely new from 06/18/2010 but are nonspecific. Continued attention on follow-up exams is warranted. 3.  Aortic atherosclerosis (ICD10-I70.0). Electronically Signed   By: Lorin Picket M.D.   On: 10/18/2019 13:12   CT Abdomen Pelvis W Contrast  Result Date: 10/18/2019 CLINICAL DATA:  History of lymphoma. Lymphadenopathy. Bilateral lower extremity edema. EXAM: CT CHEST, ABDOMEN, AND PELVIS WITH CONTRAST TECHNIQUE: Multidetector CT imaging of the chest, abdomen and pelvis was performed following the standard protocol during bolus administration of intravenous contrast. CONTRAST:  26mL OMNIPAQUE IOHEXOL 300 MG/ML  SOLN COMPARISON:  06/18/2010. FINDINGS: CT CHEST FINDINGS Cardiovascular: Atherosclerotic calcification of the aorta. Heart size is normal. No pericardial effusion. Mediastinum/Nodes: Low internal jugular and anterior scalene lymph nodes measure up to 9 mm on the right, new from 06/18/2010. Mediastinal adenopathy measures up to 1.4 cm  in the low left paratracheal station, increased from 5 mm on 06/18/2010. Bulky bilateral axillary and subpectoral adenopathy. Index left axillary lymph node measures 2.2 cm (3/14), enlarged from 9 mm. Esophagus is mildly dilated and contains fluid. Lungs/Pleura: Mild mosaic attenuation in the lungs, nonspecific. Multiple scattered tiny pulmonary nodules measure 4 mm or less in size and appear largely new from 06/18/2010. 8 x 11 mm nodule in the medial segment right middle lobe (4/78) is unchanged from 06/18/2010 and considered benign. Scattered volume loss in the right middle lobe, lingula and both lower lobes. No pleural fluid. Airway is unremarkable. Musculoskeletal: No worrisome lytic or sclerotic lesions. CT ABDOMEN PELVIS FINDINGS Hepatobiliary: Liver is unremarkable. Cholecystectomy. No biliary ductal dilatation. Pancreas: Negative. Spleen: Contains subcentimeter low-attenuation lesions which are too small to characterize. Normal in size. Adrenals/Urinary Tract: Slight nodularity of the lateral limb right adrenal gland. There may be a small gastric diverticulum, mimicking a left adrenal nodule (3/52-53). Left adrenal gland is otherwise unremarkable. Low-attenuation lesions in the right kidney measure up to 1.7 cm and are likely cysts. Ureters are decompressed. Bladder is grossly unremarkable. Stomach/Bowel: Stomach, small bowel, appendix and colon are unremarkable. Vascular/Lymphatic: Atherosclerotic calcification of the aorta without aneurysm. Gastrohepatic ligament lymph nodes measure up to 10 mm, enlarged from 6 mm. Periportal lymph nodes measure up to 2.1 cm (3/58), enlarged from 1.2 cm. Abdominal retroperitoneal lymph nodes measure up to 8 mm in the aortocaval station (3/62), increased from 5 mm. Bilateral iliac chain lymph nodes measure up to 11 mm in the right external iliac chain (3/97), previously 7 mm. Inguinal adenopathy measures up to 9 mm on the right, previously 5 mm. Small bowel mesenteric lymph  nodes measure up to 8 mm in the ileocolic mesentery (123456), increased from 6 mm. Reproductive: Uterus is visualized and likely contains internal an exophytic low-attenuation lesions, measuring up to 1.8 cm, likely fibroids. Other: No free fluid. Mesenteries and peritoneum are otherwise unremarkable. Musculoskeletal: Degenerative changes in the spine. No worrisome lytic or sclerotic lesions. IMPRESSION: 1. Adenopathy throughout the low neck, chest, abdomen and pelvis is most indicative of recurrent lymphoma. Spleen is normal in size. 2. Tiny scattered pulmonary nodules appear largely new from 06/18/2010 but are nonspecific. Continued attention on follow-up exams is warranted. 3.  Aortic atherosclerosis (ICD10-I70.0). Electronically Signed   By: Rip Harbour  Blietz M.D.   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  PLAN:    1. 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  Patient's white blood cell count continues to trend up and is now 35.6.  CT scan results from October 18, 2019 reviewed independently and report as above with widespread progressive lymphadenopathy.  Will get ultrasound-guided biopsy of easily palpable right cervical lymph node to confirm diagnosis.  Patient return to clinic 1 week after biopsy to discuss the results and treatment planning if necessary.    I spent a total of 30 minutes reviewing chart data, face-to-face evaluation with the patient, counseling and coordination of care as detailed above.  Patient expressed understanding and was in agreement with this plan. She also understands that She can call clinic at any time with any questions, concerns, or complaints.    Lloyd Huger, MD   10/20/2019 6:46 AM

## 2019-10-17 ENCOUNTER — Telehealth: Payer: Self-pay

## 2019-10-17 NOTE — Telephone Encounter (Signed)
Copied from [place] [phone]. Topic: Referral - Request for Referral >> Oct 17, 2019  4:14 PM Celene Kras wrote: Has patient seen PCP for this complaint? Yes.   *If NO, is insurance requiring patient see PCP for this issue before PCP can refer them? Referral for which specialty:  Neurology  Preferred provider/office: n/a Reason for referral: Pts son states that PCP recommended for pt to be seen by neurologist due to pts memory issues. Please advise.

## 2019-10-17 NOTE — Telephone Encounter (Signed)
I talked to him and updated him after her last appointment.  Do we need to schedule a virtual visit?

## 2019-10-17 NOTE — Telephone Encounter (Signed)
Copied from [place] [id number]. Topic: General - Other >> Oct 17, 2019  8:35 AM Sheran Luz wrote: [name], [name], requesting call back from Dr. [name] to discuss "numerous" things. He states that he is never contacted with patients labs, appointments, referrals... even though he has requested that he is the primary contact for this patient. He declined to speak with anyone else stating it is imperative that he speak with Dr. [name].

## 2019-10-18 ENCOUNTER — Ambulatory Visit
Admission: RE | Admit: 2019-10-18 | Discharge: 2019-10-18 | Disposition: A | Discharge status: Home / Self Care | Payer: PPO | Source: Ambulatory Visit | Attending: Oncology | Admitting: Oncology

## 2019-10-18 ENCOUNTER — Other Ambulatory Visit: Payer: Self-pay

## 2019-10-18 ENCOUNTER — Encounter: Payer: Self-pay | Admitting: Oncology

## 2019-10-18 DIAGNOSIS — C9110 Chronic lymphocytic leukemia of B-cell type not having achieved remission: Secondary | ICD-10-CM | POA: Insufficient documentation

## 2019-10-18 DIAGNOSIS — C8590 Non-Hodgkin lymphoma, unspecified, unspecified site: Secondary | ICD-10-CM | POA: Diagnosis not present

## 2019-10-18 MED ORDER — IOHEXOL 300 MG/ML  SOLN
85.0000 mL | Freq: Once | INTRAMUSCULAR | Status: AC | PRN
  Administered 2019-10-18: 85 mL via INTRAVENOUS

## 2019-10-18 NOTE — Progress Notes (Signed)
Pt prescreened for appt with assistance of son, reports that pt has been having issues with memory

## 2019-10-18 NOTE — Telephone Encounter (Signed)
Referral was placed on 4/1 and appt is scheduled: Dr Manuella Ghazi 11/10/19 at 2:30  Please check chart review before forwarding to provider. Thanks!

## 2019-10-18 NOTE — Telephone Encounter (Signed)
Mr. Barbara Chambers just wanted to make sure that we have added his number to be called with any labs results, appointment and any referral.

## 2019-10-19 ENCOUNTER — Inpatient Hospital Stay (HOSPITAL_BASED_OUTPATIENT_CLINIC_OR_DEPARTMENT_OTHER): Payer: PPO | Admitting: Oncology

## 2019-10-19 ENCOUNTER — Telehealth: Payer: Self-pay | Admitting: Emergency Medicine

## 2019-10-19 ENCOUNTER — Inpatient Hospital Stay: Payer: PPO | Attending: Oncology

## 2019-10-19 ENCOUNTER — Encounter (INDEPENDENT_AMBULATORY_CARE_PROVIDER_SITE_OTHER): Payer: Self-pay

## 2019-10-19 VITALS — BP 165/61 | HR 81 | Temp 98.0°F | Resp 18 | Wt 144.0 lb

## 2019-10-19 DIAGNOSIS — C9110 Chronic lymphocytic leukemia of B-cell type not having achieved remission: Secondary | ICD-10-CM

## 2019-10-19 DIAGNOSIS — Z79899 Other long term (current) drug therapy: Secondary | ICD-10-CM | POA: Insufficient documentation

## 2019-10-19 DIAGNOSIS — E785 Hyperlipidemia, unspecified: Secondary | ICD-10-CM | POA: Insufficient documentation

## 2019-10-19 DIAGNOSIS — I10 Essential (primary) hypertension: Secondary | ICD-10-CM | POA: Insufficient documentation

## 2019-10-19 DIAGNOSIS — C8590 Non-Hodgkin lymphoma, unspecified, unspecified site: Secondary | ICD-10-CM | POA: Diagnosis not present

## 2019-10-19 LAB — COMPREHENSIVE METABOLIC PANEL
ALT: 17 U/L (ref 0–44)
AST: 24 U/L (ref 15–41)
Albumin: 4.3 g/dL (ref 3.5–5.0)
Alkaline Phosphatase: 109 U/L (ref 38–126)
Anion gap: 9 (ref 5–15)
BUN: 15 mg/dL (ref 8–23)
CO2: 28 mmol/L (ref 22–32)
Calcium: 9.2 mg/dL (ref 8.9–10.3)
Chloride: 104 mmol/L (ref 98–111)
Creatinine, Ser: 0.75 mg/dL (ref 0.44–1.00)
GFR calc Af Amer: >60 mL/min (ref >60)
GFR calc non Af Amer: >60 mL/min (ref >60)
Glucose, Bld: 101 mg/dL — ABNORMAL HIGH (ref 70–99)
Potassium: 4.5 mmol/L (ref 3.5–5.1)
Sodium: 141 mmol/L (ref 135–145)
Total Bilirubin: 0.6 mg/dL (ref 0.3–1.2)
Total Protein: 7.4 g/dL (ref 6.5–8.1)

## 2019-10-19 LAB — CBC WITH DIFFERENTIAL/PLATELET
Abs Immature Granulocytes: 0 10*3/uL (ref 0.00–0.07)
Basophils Absolute: 0 10*3/uL (ref 0.0–0.1)
Basophils Relative: 0 %
Eosinophils Absolute: 0 10*3/uL (ref 0.0–0.5)
Eosinophils Relative: 0 %
HCT: 43 % (ref 36.0–46.0)
Hemoglobin: 13.5 g/dL (ref 12.0–15.0)
Lymphocytes Relative: 78 %
Lymphs Abs: 27.8 10*3/uL — ABNORMAL HIGH (ref 0.7–4.0)
MCH: 26.6 pg (ref 26.0–34.0)
MCHC: 31.4 g/dL (ref 30.0–36.0)
MCV: 84.8 fL (ref 80.0–100.0)
Monocytes Absolute: 1.1 10*3/uL — ABNORMAL HIGH (ref 0.1–1.0)
Monocytes Relative: 3 %
Neutro Abs: 6.8 10*3/uL (ref 1.7–7.7)
Neutrophils Relative %: 19 %
Platelets: 329 10*3/uL (ref 150–400)
RBC: 5.07 MIL/uL (ref 3.87–5.11)
RDW: 14.1 % (ref 11.5–15.5)
Smear Review: NORMAL
WBC Morphology: ABNORMAL
WBC: 35.6 10*3/uL — ABNORMAL HIGH (ref 4.0–10.5)
nRBC: 0 % (ref 0.0–0.2)

## 2019-10-19 NOTE — Telephone Encounter (Signed)
Called pt to let her know that biopsy is scheduled for Tuesday, May 4th with arrival time of 9:30 for 10:30 procedure time. No answer, message left.

## 2019-10-23 ENCOUNTER — Telehealth: Payer: Self-pay | Admitting: Family Medicine

## 2019-10-23 NOTE — Chronic Care Management (AMB) (Signed)
  Care Management   Note  10/23/2019 Name: Barbara Chambers MRN: [id number] DOB: Jan 16, 1935  Barbara Chambers is a 85 y.o. year old female who is a primary care patient of [name], [name], MD and is actively engaged with the care management team. I reached out to Barbara Chambers by phone today to assist with scheduling an initial visit with the Pharmacist  referred by the patients health plan.  Follow up plan: Telephone appointment with care management team member scheduled for: 11/03/2019.  [place], [place] [address] Direct Dial: [phone] [email or web address]: [email or web address]

## 2019-10-24 LAB — COMP PANEL: LEUKEMIA/LYMPHOMA

## 2019-10-26 ENCOUNTER — Other Ambulatory Visit: Payer: Self-pay

## 2019-10-26 DIAGNOSIS — R413 Other amnesia: Secondary | ICD-10-CM

## 2019-10-26 DIAGNOSIS — F22 Delusional disorders: Secondary | ICD-10-CM

## 2019-10-26 NOTE — Progress Notes (Signed)
Patient on schedule for axillary LN biopsy 10/31/2019, spoke with son on phone, made aware to be here @ 0900, NPO after [place],and driver for discharge to home post recovery, stated understanding.

## 2019-10-27 ENCOUNTER — Other Ambulatory Visit: Payer: Self-pay | Admitting: Radiology

## 2019-10-31 ENCOUNTER — Other Ambulatory Visit: Payer: Self-pay

## 2019-10-31 ENCOUNTER — Ambulatory Visit
Admission: RE | Admit: 2019-10-31 | Discharge: 2019-10-31 | Disposition: A | Discharge status: Home / Self Care | Payer: PPO | Source: Ambulatory Visit | Attending: Oncology | Admitting: Oncology

## 2019-10-31 ENCOUNTER — Ambulatory Visit: Payer: PPO

## 2019-10-31 DIAGNOSIS — C9110 Chronic lymphocytic leukemia of B-cell type not having achieved remission: Secondary | ICD-10-CM | POA: Insufficient documentation

## 2019-10-31 DIAGNOSIS — Z856 Personal history of leukemia: Secondary | ICD-10-CM | POA: Diagnosis not present

## 2019-10-31 DIAGNOSIS — R590 Localized enlarged lymph nodes: Secondary | ICD-10-CM | POA: Insufficient documentation

## 2019-10-31 DIAGNOSIS — C8518 Unspecified B-cell lymphoma, lymph nodes of multiple sites: Secondary | ICD-10-CM | POA: Diagnosis not present

## 2019-10-31 DIAGNOSIS — E785 Hyperlipidemia, unspecified: Secondary | ICD-10-CM | POA: Insufficient documentation

## 2019-10-31 DIAGNOSIS — Z79899 Other long term (current) drug therapy: Secondary | ICD-10-CM | POA: Diagnosis not present

## 2019-10-31 DIAGNOSIS — I10 Essential (primary) hypertension: Secondary | ICD-10-CM | POA: Diagnosis not present

## 2019-10-31 DIAGNOSIS — Z7982 Long term (current) use of aspirin: Secondary | ICD-10-CM | POA: Diagnosis not present

## 2019-10-31 DIAGNOSIS — C8590 Non-Hodgkin lymphoma, unspecified, unspecified site: Secondary | ICD-10-CM

## 2019-10-31 DIAGNOSIS — Z7901 Long term (current) use of anticoagulants: Secondary | ICD-10-CM | POA: Diagnosis not present

## 2019-10-31 DIAGNOSIS — Z7689 Persons encountering health services in other specified circumstances: Secondary | ICD-10-CM | POA: Diagnosis not present

## 2019-10-31 LAB — CBC
HCT: 41.1 % (ref 36.0–46.0)
Hemoglobin: 12.8 g/dL (ref 12.0–15.0)
MCH: 26.9 pg (ref 26.0–34.0)
MCHC: 31.1 g/dL (ref 30.0–36.0)
MCV: 86.5 fL (ref 80.0–100.0)
Platelets: 261 10*3/uL (ref 150–400)
RBC: 4.75 MIL/uL (ref 3.87–5.11)
RDW: 14.1 % (ref 11.5–15.5)
WBC: 33.3 10*3/uL — ABNORMAL HIGH (ref 4.0–10.5)
nRBC: 0 % (ref 0.0–0.2)

## 2019-10-31 LAB — PROTIME-INR
INR: 1 (ref 0.8–1.2)
Prothrombin Time: 12.6 seconds (ref 11.4–15.2)

## 2019-10-31 MED ORDER — SODIUM CHLORIDE 0.9 % IV SOLN: INTRAVENOUS | Status: DC

## 2019-10-31 MED ORDER — FENTANYL CITRATE (PF) 100 MCG/2ML IJ SOLN
INTRAMUSCULAR | Status: DC | PRN
  Administered 2019-10-31: 50 ug via INTRAVENOUS

## 2019-10-31 MED ORDER — MIDAZOLAM HCL 2 MG/2ML IJ SOLN
INTRAMUSCULAR | Status: AC
  Filled 2019-10-31: qty 2

## 2019-10-31 MED ORDER — FENTANYL CITRATE (PF) 100 MCG/2ML IJ SOLN
INTRAMUSCULAR | Status: AC
  Filled 2019-10-31: qty 2

## 2019-10-31 MED ORDER — MIDAZOLAM HCL 2 MG/2ML IJ SOLN
INTRAMUSCULAR | Status: DC | PRN
  Administered 2019-10-31: 1 mg via INTRAVENOUS

## 2019-10-31 NOTE — Procedures (Signed)
Interventional Radiology Procedure Note  Procedure: US Guided Biopsy of left axillary LN  Complications: None  Estimated Blood Loss: < 10 mL  Findings: 75 G core biopsy of left axillary lymph node performed under US guidance.  Four core samples obtained and sent to Pathology.  Venetia Night. Kathlene Cote, M.D Pager:  [phone]

## 2019-10-31 NOTE — H&P (Signed)
Chief Complaint: Patient was seen in consultation today for left axillary/subpectoral lymph node biopsy.  Referring Physician(s): [name],Timothy J  Supervising Physician: Aletta Edouard  Patient Status: [facility] - Out-pt  History of Present Illness: Barbara Chambers is a 85 y.o. female with a past medical history significant for HLD, HTN and CLL variant (diagnosed 2011) who presents today for an US guided left axillary/subpectoral lymph node biopsy. Barbara Chambers noted swelling on the right side of her neck several months ago which continued to grow, she was seen by her PCP and an [place] was ordered which noted bilateral cervical lymphadenopathy suspicious for lymphoma recurrence or other lymphoproliferative disorder. She was referred to oncology and underwent a CT chest/abdomen/pelvis with contrast on 4/21 which noted adenopathy throughout the low neck, chest, abdomen and pelvis most indicative of recurrent lymphoma. IR has been asked to perform a biopsy of these lymph nodes to further direct care.   Barbara Chambers denies any complaints today, she has been feeling well overall. She was under the impression that her right neck would be biopsied today but after explanation of procedure she is agreeable to proceed with left axillary/subpectoral lymph node biopsy.   Past Medical History:  Diagnosis Date  . Chronic lymphocytic leukemia ([place]) 2011  . Hyperlipidemia   . Hypertension     Past Surgical History:  Procedure Laterality Date  . cataract surgery Bilateral 2005  . CHOLECYSTECTOMY  1991  . RECONSTRUCTION OF EYELID      Allergies: Hctz [hydrochlorothiazide] and Penicillins  Medications: Prior to Admission medications   Medication Sig Start Date End Date Taking? Authorizing Provider  amLODipine (NORVASC) 5 MG tablet Take 1 tablet (5 mg total) by mouth daily. 09/01/19   [name], MD  aspirin 81 MG chewable tablet Chew 81 mg by mouth daily.    [provider]  COENZYME Q-10  PO Take 1 capsule by mouth daily.  05/09/10   [provider]  DULoxetine (CYMBALTA) 20 MG capsule Take 20 mg by mouth daily. 07/01/19   [provider]  losartan (COZAAR) 100 MG tablet TAKE 1 TABLET BY MOUTH EVERY DAY 06/17/19   [name], [name], MD  metoprolol succinate (TOPROL-XL) 50 MG 24 hr tablet TAKE 1 TABLET BY MOUTH EVERY DAY 05/08/19   [name], [name], MD  metroNIDAZOLE (METROGEL) 0.75 % gel Apply 1 application topically daily. For face and neck Patient not taking: Reported on 10/18/2019 09/01/19   [name], MD  OLANZapine (ZYPREXA) 2.5 MG tablet TAKE 1 TABLET (2.5 MG TOTAL) BY MOUTH AT BEDTIME. 07/12/19   Mozingo, Berdie Ogren, NP  simvastatin (ZOCOR) 10 MG tablet Take 1 tablet (10 mg total) by mouth at bedtime. 03/17/19   [name], MD     Family History  Problem Relation Age of Onset  . Hypertension Mother   . Stroke Mother   . CAD Mother   . Hypertension Father   . Heart attack Father   . Hypertension Brother   . Seizures Brother   . Hypertension Son   . Breast cancer Neg Hx     Social History   Socioeconomic History  . Marital status: Widowed    Spouse name: Casandra Doffing  . Number of children: 3  . Years of education: Not on file  . Highest education level: 12th grade  Occupational History  . Occupation: retired  Tobacco Use  . Smoking status: Never Smoker  . Smokeless tobacco: Never Used  Substance and Sexual Activity  .  Alcohol use: No  . Drug use: No  . Sexual activity: Not on file  Other Topics Concern  . Not on file  Social History Narrative   Pt has 1 son that was killed in a car accident at age 62.    [name] is primary caregiver   Social Determinants of Health   Financial Resource Strain: Low Risk   . Difficulty of Paying Living Expenses: Not hard at all  Food Insecurity: No Food Insecurity  . Worried About Charity fundraiser in the Last Year: Never true  . Ran Out of Food in the Last Year: Never true    Transportation Needs: No Transportation Needs  . Lack of Transportation (Medical): No  . Lack of Transportation (Non-Medical): No  Physical Activity: Inactive  . Days of Exercise per Week: 0 days  . Minutes of Exercise per Session: 0 min  Stress: No Stress Concern Present  . Feeling of Stress : Not at all  Social Connections: Slightly Isolated  . Frequency of Communication with Friends and Family: More than three times a week  . Frequency of Social Gatherings with Friends and Family: More than three times a week  . Attends Religious Services: More than 4 times per year  . Active Member of Clubs or Organizations: Yes  . Attends Archivist Meetings: More than 4 times per year  . Marital Status: Widowed     Review of Systems: A 12 point ROS discussed and pertinent positives are indicated in the HPI above.  All other systems are negative.  Review of Systems  Constitutional: Negative for chills and fever.  Respiratory: Negative for cough and shortness of breath.   Cardiovascular: Negative for chest pain.  Gastrointestinal: Negative for abdominal pain, blood in stool, diarrhea, nausea and vomiting.  Genitourinary: Negative for dysuria and hematuria.  Musculoskeletal: Negative for back pain.  Skin: Negative for wound.  Neurological: Negative for dizziness and headaches.    Vital Signs: BP (!) 136/58   Pulse 83   Temp 97.9 F (36.6 C) (Oral)   Resp 20   Ht 5' (1.524 m)   Wt 143 lb 15.4 oz (65.3 kg)   SpO2 99%   BMI 28.12 kg/m   Physical Exam Vitals reviewed.  Constitutional:      General: She is not in acute distress. HENT:     Head: Normocephalic.     Mouth/Throat:     Mouth: Mucous membranes are moist.     Pharynx: Oropharynx is clear. No oropharyngeal exudate or posterior oropharyngeal erythema.  Cardiovascular:     Rate and Rhythm: Normal rate and regular rhythm.  Pulmonary:     Effort: Pulmonary effort is normal.     Breath sounds: Normal breath  sounds.  Abdominal:     General: There is no distension.     Palpations: Abdomen is soft.     Tenderness: There is no abdominal tenderness.  Skin:    General: Skin is warm and dry.  Neurological:     Mental Status: She is alert and oriented to person, place, and time.  Psychiatric:        Mood and Affect: Mood normal.        Behavior: Behavior normal.        Thought Content: Thought content normal.        Judgment: Judgment normal.      MD Evaluation Airway: WNL Heart: WNL Abdomen: WNL Chest/ Lungs: WNL ASA  Classification: 2 Mallampati/Airway Score: One  Imaging: CT Chest W Contrast  Result Date: 10/18/2019 CLINICAL DATA:  History of lymphoma. Lymphadenopathy. Bilateral lower extremity edema. EXAM: CT CHEST, ABDOMEN, AND PELVIS WITH CONTRAST TECHNIQUE: Multidetector CT imaging of the chest, abdomen and pelvis was performed following the standard protocol during bolus administration of intravenous contrast. CONTRAST:  62mL OMNIPAQUE IOHEXOL 300 MG/ML  SOLN COMPARISON:  06/18/2010. FINDINGS: CT CHEST FINDINGS Cardiovascular: Atherosclerotic calcification of the aorta. Heart size is normal. No pericardial effusion. Mediastinum/Nodes: Low internal jugular and anterior scalene lymph nodes measure up to 9 mm on the right, new from 06/18/2010. Mediastinal adenopathy measures up to 1.4 cm in the low left paratracheal station, increased from 5 mm on 06/18/2010. Bulky bilateral axillary and subpectoral adenopathy. Index left axillary lymph node measures 2.2 cm (3/14), enlarged from 9 mm. Esophagus is mildly dilated and contains fluid. Lungs/Pleura: Mild mosaic attenuation in the lungs, nonspecific. Multiple scattered tiny pulmonary nodules measure 4 mm or less in size and appear largely new from 06/18/2010. 8 x 11 mm nodule in the medial segment right middle lobe (4/78) is unchanged from 06/18/2010 and considered benign. Scattered volume loss in the right middle lobe, lingula and both lower  lobes. No pleural fluid. Airway is unremarkable. Musculoskeletal: No worrisome lytic or sclerotic lesions. CT ABDOMEN PELVIS FINDINGS Hepatobiliary: Liver is unremarkable. Cholecystectomy. No biliary ductal dilatation. Pancreas: Negative. Spleen: Contains subcentimeter low-attenuation lesions which are too small to characterize. Normal in size. Adrenals/Urinary Tract: Slight nodularity of the lateral limb right adrenal gland. There may be a small gastric diverticulum, mimicking a left adrenal nodule (3/52-53). Left adrenal gland is otherwise unremarkable. Low-attenuation lesions in the right kidney measure up to 1.7 cm and are likely cysts. Ureters are decompressed. Bladder is grossly unremarkable. Stomach/Bowel: Stomach, small bowel, appendix and colon are unremarkable. Vascular/Lymphatic: Atherosclerotic calcification of the aorta without aneurysm. Gastrohepatic ligament lymph nodes measure up to 10 mm, enlarged from 6 mm. Periportal lymph nodes measure up to 2.1 cm (3/58), enlarged from 1.2 cm. Abdominal retroperitoneal lymph nodes measure up to 8 mm in the aortocaval station (3/62), increased from 5 mm. Bilateral iliac chain lymph nodes measure up to 11 mm in the right external iliac chain (3/97), previously 7 mm. Inguinal adenopathy measures up to 9 mm on the right, previously 5 mm. Small bowel mesenteric lymph nodes measure up to 8 mm in the ileocolic mesentery (123456), increased from 6 mm. Reproductive: Uterus is visualized and likely contains internal an exophytic low-attenuation lesions, measuring up to 1.8 cm, likely fibroids. Other: No free fluid. Mesenteries and peritoneum are otherwise unremarkable. Musculoskeletal: Degenerative changes in the spine. No worrisome lytic or sclerotic lesions. IMPRESSION: 1. Adenopathy throughout the low neck, chest, abdomen and pelvis is most indicative of recurrent lymphoma. Spleen is normal in size. 2. Tiny scattered pulmonary nodules appear largely new from 06/18/2010  but are nonspecific. Continued attention on follow-up exams is warranted. 3.  Aortic atherosclerosis (ICD10-I70.0). Electronically Signed   By: Lorin Picket M.D.   On: 10/18/2019 13:12   US Soft Tissue Head/Neck (NON-THYROID)  Result Date: 10/02/2019 CLINICAL DATA:  85 year old female with remote history of lymphoma in 2011. Six months of palpable abnormality in the right lateral neck. EXAM: ULTRASOUND OF HEAD/NECK SOFT TISSUES TECHNIQUE: Ultrasound examination of the head and neck soft tissues was performed in the area of clinical concern. COMPARISON:  CT Chest, Abdomen, and Pelvis 06/18/2010. No prior neck imaging. FINDINGS: Grayscale and color Doppler images demonstrate several or multiple enlarged lymph nodes in the right lateral  neck ranging from 14-17 mm short axis. Several images of the contralateral left neck also demonstrates similar increased lymph nodes. No cystic or necrotic nodes are identified. IMPRESSION: Positive for bilateral cervical lymphadenopathy suspicious for lymphoma recurrence in this setting, or other lymphoproliferative disorder. Electronically Signed   By: Genevie Ann M.D.   On: 10/02/2019 15:52   CT Abdomen Pelvis W Contrast  Result Date: 10/18/2019 CLINICAL DATA:  History of lymphoma. Lymphadenopathy. Bilateral lower extremity edema. EXAM: CT CHEST, ABDOMEN, AND PELVIS WITH CONTRAST TECHNIQUE: Multidetector CT imaging of the chest, abdomen and pelvis was performed following the standard protocol during bolus administration of intravenous contrast. CONTRAST:  59mL OMNIPAQUE IOHEXOL 300 MG/ML  SOLN COMPARISON:  06/18/2010. FINDINGS: CT CHEST FINDINGS Cardiovascular: Atherosclerotic calcification of the aorta. Heart size is normal. No pericardial effusion. Mediastinum/Nodes: Low internal jugular and anterior scalene lymph nodes measure up to 9 mm on the right, new from 06/18/2010. Mediastinal adenopathy measures up to 1.4 cm in the low left paratracheal station, increased from 5 mm  on 06/18/2010. Bulky bilateral axillary and subpectoral adenopathy. Index left axillary lymph node measures 2.2 cm (3/14), enlarged from 9 mm. Esophagus is mildly dilated and contains fluid. Lungs/Pleura: Mild mosaic attenuation in the lungs, nonspecific. Multiple scattered tiny pulmonary nodules measure 4 mm or less in size and appear largely new from 06/18/2010. 8 x 11 mm nodule in the medial segment right middle lobe (4/78) is unchanged from 06/18/2010 and considered benign. Scattered volume loss in the right middle lobe, lingula and both lower lobes. No pleural fluid. Airway is unremarkable. Musculoskeletal: No worrisome lytic or sclerotic lesions. CT ABDOMEN PELVIS FINDINGS Hepatobiliary: Liver is unremarkable. Cholecystectomy. No biliary ductal dilatation. Pancreas: Negative. Spleen: Contains subcentimeter low-attenuation lesions which are too small to characterize. Normal in size. Adrenals/Urinary Tract: Slight nodularity of the lateral limb right adrenal gland. There may be a small gastric diverticulum, mimicking a left adrenal nodule (3/52-53). Left adrenal gland is otherwise unremarkable. Low-attenuation lesions in the right kidney measure up to 1.7 cm and are likely cysts. Ureters are decompressed. Bladder is grossly unremarkable. Stomach/Bowel: Stomach, small bowel, appendix and colon are unremarkable. Vascular/Lymphatic: Atherosclerotic calcification of the aorta without aneurysm. Gastrohepatic ligament lymph nodes measure up to 10 mm, enlarged from 6 mm. Periportal lymph nodes measure up to 2.1 cm (3/58), enlarged from 1.2 cm. Abdominal retroperitoneal lymph nodes measure up to 8 mm in the aortocaval station (3/62), increased from 5 mm. Bilateral iliac chain lymph nodes measure up to 11 mm in the right external iliac chain (3/97), previously 7 mm. Inguinal adenopathy measures up to 9 mm on the right, previously 5 mm. Small bowel mesenteric lymph nodes measure up to 8 mm in the ileocolic mesentery  (123456), increased from 6 mm. Reproductive: Uterus is visualized and likely contains internal an exophytic low-attenuation lesions, measuring up to 1.8 cm, likely fibroids. Other: No free fluid. Mesenteries and peritoneum are otherwise unremarkable. Musculoskeletal: Degenerative changes in the spine. No worrisome lytic or sclerotic lesions. IMPRESSION: 1. Adenopathy throughout the low neck, chest, abdomen and pelvis is most indicative of recurrent lymphoma. Spleen is normal in size. 2. Tiny scattered pulmonary nodules appear largely new from 06/18/2010 but are nonspecific. Continued attention on follow-up exams is warranted. 3.  Aortic atherosclerosis (ICD10-I70.0). Electronically Signed   By: Lorin Picket M.D.   On: 10/18/2019 13:12    Labs:  CBC: Recent Labs    03/04/19 0355 03/05/19 0523 09/28/19 0936 10/19/19 0957  WBC 22.0* 21.0* 29.4* 35.6*  HGB 10.6* 12.0 12.9 13.5  HCT 31.7* 35.8* 41.2 43.0  PLT 249 308 262 329    COAGS: No results for input(s): INR, APTT in the last 8760 hours.  BMP: Recent Labs    03/17/19 0939 05/11/19 1054 09/28/19 0936 10/19/19 0957  NA 139 141 141 141  K 3.4* 4.2 4.8 4.5  CL 100 103 103 104  CO2 25 23 23 28   GLUCOSE 104* 91 96 101*  BUN 8 17 16 15   CALCIUM 9.1 9.1 9.4 9.2  CREATININE 0.67 0.77 0.79 0.75  GFRNONAA 82 72 70 >60  GFRAA 95 83 81 >60    LIVER FUNCTION TESTS: Recent Labs    03/01/19 1050 03/01/19 1050 03/03/19 1528 03/08/19 1500 03/17/19 0939 09/28/19 0936 10/19/19 0957  BILITOT 0.6  --  0.4  --   --  0.4 0.6  AST 19  --  22  --   --  20 24  ALT 18  --  18  --   --  15 17  ALKPHOS 79  --  83  --   --  117 109  PROT 5.7*  --  5.9*  --   --  6.2 7.4  ALBUMIN 4.0   < > 3.5 4.1 4.0 4.1 4.3   < > = values in this interval not displayed.    TUMOR MARKERS: No results for input(s): AFPTM, CEA, CA199, CHROMGRNA in the last 8760 hours.  Assessment and Plan:  85 y/o F with history of CLL variant with recent neck swelling  found to have diffuse lymphadenopathy concerning for lymphoma recurrence who presents today for a left axillary/subpectora lymph node biopsy to further direct care.  Patient has been NPO since midnight, no blood thinning medications. Afebrile, WBC 33.3 (previous 35.6 >> 29.4), hgb 12.8, plt 261, INR pending.  Risks and benefits of left axillary/subpectoral lymph node biopsy was discussed with the patient and/or patient's family including, but not limited to bleeding, infection, damage to adjacent structures or low yield requiring additional tests.  All of the questions were answered and there is agreement to proceed.  Consent signed and in chart.   Thank you for this interesting consult.  I greatly enjoyed meeting SYLVI UDO and look forward to participating in their care.  A copy of this report was sent to the requesting provider on this date.  Electronically Signed: Joaquim Nam, PA-C 10/31/2019, 9:13 AM   I spent a total of 30 Minutes in face to face in clinical consultation, greater than 50% of which was counseling/coordinating care for left axillary/subpectoral lymph node biopsy.

## 2019-10-31 NOTE — Progress Notes (Signed)
Dr. Kathlene Cote in & spoke with pt. Re: procedure. Pt. Agreeable & verbalizes understanding of conversation.

## 2019-11-02 ENCOUNTER — Other Ambulatory Visit: Payer: Self-pay | Admitting: Adult Health

## 2019-11-02 DIAGNOSIS — B89 Unspecified parasitic disease: Secondary | ICD-10-CM

## 2019-11-03 ENCOUNTER — Telehealth: Payer: PPO

## 2019-11-03 NOTE — Chronic Care Management (AMB) (Unsigned)
   Chronic Care Management Pharmacy  Name: Barbara Chambers  MRN: [id number] DOB: 1935-05-01  Chief Complaint/ HPI  Barbara Chambers,  85 y.o. , female presents for their Initial CCM visit with the clinical pharmacist via telephone due to COVID-19 Pandemic.  PCP : Barbara Crews, MD  Their chronic conditions include: HTN, hypothyroidism, CLL, HLD, delusional disorder  Office Visits: 4/1 Annual, Barbara Chambers, BP 124/65 P 68, worsening memory, non-adherence 3/5 Rosacea, Barbara Chambers, add Norvasc 5mg  daily 11/12 HTN, Barbara Chambers, BP 123/69 P 67 BMI 23.4, delusions back to baseline on Zyprexa, d/c Cymbalta?  Consult Visit: 5/4 CLL, Barbara Chambers, biopsy 4/22 atypical CLL, Barbara Chambers, BP 165/61 P 81 Wt 144 BMI 28.12, worsening memory   Medications: Outpatient Encounter Medications as of 11/03/2019  Medication Sig  . amLODipine (NORVASC) 5 MG tablet Take 1 tablet (5 mg total) by mouth daily.  [name] aspirin 81 MG chewable tablet Chew 81 mg by mouth daily.  [name] COENZYME Q-10 PO Take 1 capsule by mouth daily.   . DULoxetine (CYMBALTA) 20 MG capsule Take 20 mg by mouth daily.  [name] losartan (COZAAR) 100 MG tablet TAKE 1 TABLET BY MOUTH EVERY DAY  . metoprolol succinate (TOPROL-XL) 50 MG 24 hr tablet TAKE 1 TABLET BY MOUTH EVERY DAY  . metroNIDAZOLE (METROGEL) 0.75 % gel Apply 1 application topically daily. For face and neck (Patient not taking: Reported on 10/18/2019)  . OLANZapine (ZYPREXA) 2.5 MG tablet TAKE 1 TABLET (2.5 MG TOTAL) BY MOUTH AT BEDTIME.  . simvastatin (ZOCOR) 10 MG tablet Take 1 tablet (10 mg total) by mouth at bedtime.   No facility-administered encounter medications on file as of 11/03/2019.     Current Diagnosis/Assessment:  Goals Addressed   None    Hypertension   Office blood pressures are  BP Readings from Last 3 Encounters:  10/31/19 (!) 125/56  10/19/19 (!) 165/61  09/28/19 124/65    Patient has failed these meds in the past: ***  Patient checks BP at home {[facility] BP  Monitoring Frequency:[phone]}  Patient home BP readings are ranging: ***  We discussed {[facility] Pharmacy discussion:[phone]}  Plan  Continue {[facility] Pharmacy Plans:[phone]}     {[facility] Pharmacy Diagnosis/Assessment:[phone]}

## 2019-11-06 ENCOUNTER — Telehealth: Payer: Self-pay | Admitting: Family Medicine

## 2019-11-06 LAB — SURGICAL PATHOLOGY

## 2019-11-06 NOTE — Chronic Care Management (AMB) (Signed)
  Care Management   Note  11/06/2019 Name: Barbara Chambers MRN: [id number] DOB: 1934-09-24  Barbara Chambers is a 85 y.o. year old female who is a primary care patient of [name], [name], MD and is actively engaged with the care management team. I reached out to Barbara Chambers by phone today to assist with re-scheduling an initial visit with the Pharmacist.  Follow up plan: Telephone appointment with care management team member scheduled for:12/04/2019.  [place], [place] [id number] Direct Dial: [phone] [email or web address]: [email or web address]

## 2019-11-09 DIAGNOSIS — I700 Atherosclerosis of aorta: Secondary | ICD-10-CM | POA: Diagnosis not present

## 2019-11-09 DIAGNOSIS — E782 Mixed hyperlipidemia: Secondary | ICD-10-CM | POA: Diagnosis not present

## 2019-11-09 DIAGNOSIS — R0602 Shortness of breath: Secondary | ICD-10-CM | POA: Diagnosis not present

## 2019-11-09 DIAGNOSIS — R600 Localized edema: Secondary | ICD-10-CM | POA: Insufficient documentation

## 2019-11-09 DIAGNOSIS — I10 Essential (primary) hypertension: Secondary | ICD-10-CM | POA: Diagnosis not present

## 2019-11-12 NOTE — Progress Notes (Signed)
[place]  [phone]) [phone]  [phone]     Barbara Chambers DOB: Sep 02, 1934  MR#: [id number]  [id number]  Patient Care Team: [name], MD as PCP - General (Family Medicine) Lloyd Huger, MD as Consulting Physician (Oncology) Corey Skains, MD as Consulting Physician (Cardiology) Brendolyn Patty, MD as Consulting Physician (Dermatology) Birder Robson, MD as Referring Physician (Ophthalmology) Verdia Kuba, [facility] (Pharmacist)   CHIEF COMPLAINT: Progressive CLL.    INTERVAL HISTORY: Patient returns to clinic today for further evaluation and discussion of her imaging and biopsy results.  She continues to have memory complaints.  She reports chronic weakness and fatigue, but otherwise feels well.  She has no neurologic complaints. She denies any fevers, night sweats, or weight loss. She denies any chest pain, shortness of breath, cough, or hemoptysis.  She denies any nausea, vomiting, constipation, or diarrhea. She has no urinary complaints.  Patient offers no further specific complaints today.  REVIEW OF SYSTEMS:   Review of Systems  Constitutional: Positive for malaise/fatigue. Negative for diaphoresis, fever and weight loss.  Respiratory: Negative.  Negative for cough and shortness of breath.   Cardiovascular: Negative.  Negative for chest pain and leg swelling.  Gastrointestinal: Negative.  Negative for abdominal pain.  Genitourinary: Negative.  Negative for dysuria.  Musculoskeletal: Negative.  Negative for back pain.  Skin: Negative.  Negative for rash.  Neurological: Positive for weakness. Negative for dizziness, sensory change, focal weakness and headaches.  Psychiatric/Behavioral: Positive for memory loss. Negative for depression. The patient is not nervous/anxious.     As per HPI. Otherwise, a complete review of systems is negative.  PAST MEDICAL HISTORY: Past Medical History:  Diagnosis Date  . Chronic lymphocytic  leukemia ([place]) 2011  . Hyperlipidemia   . Hypertension     PAST SURGICAL HISTORY: Past Surgical History:  Procedure Laterality Date  . cataract surgery Bilateral 2005  . CHOLECYSTECTOMY  1991  . RECONSTRUCTION OF EYELID      FAMILY HISTORY Family History  Problem Relation Age of Onset  . Hypertension Mother   . Stroke Mother   . CAD Mother   . Hypertension Father   . Heart attack Father   . Hypertension Brother   . Seizures Brother   . Hypertension Son   . Breast cancer Neg Hx     GYNECOLOGIC HISTORY:  No LMP recorded. Patient is postmenopausal.     ADVANCED DIRECTIVES:    HEALTH MAINTENANCE: Social History   Tobacco Use  . Smoking status: Never Smoker  . Smokeless tobacco: Never Used  Substance Use Topics  . Alcohol use: No  . Drug use: No     Colonoscopy:  PAP:  Bone density:  Lipid panel:  Allergies  Allergen Reactions  . Hctz [Hydrochlorothiazide]     Hyponatremia  . Penicillins Rash    Current Outpatient Medications  Medication Sig Dispense Refill  . amLODipine (NORVASC) 5 MG tablet Take 1 tablet (5 mg total) by mouth daily. 30 tablet 3  . aspirin 81 MG chewable tablet Chew 81 mg by mouth daily.    [name] COENZYME Q-10 PO Take 1 capsule by mouth daily.     . DULoxetine (CYMBALTA) 20 MG capsule Take 20 mg by mouth daily.    [name] losartan (COZAAR) 100 MG tablet TAKE 1 TABLET BY MOUTH EVERY DAY 90 tablet 3  . metoprolol succinate (TOPROL-XL) 50 MG 24 hr tablet TAKE 1 TABLET BY MOUTH EVERY DAY 90 tablet 1  .  OLANZapine (ZYPREXA) 2.5 MG tablet TAKE 1 TABLET (2.5 MG TOTAL) BY MOUTH AT BEDTIME. 30 tablet 2  . simvastatin (ZOCOR) 10 MG tablet Take 1 tablet (10 mg total) by mouth at bedtime. 90 tablet 3  . Ibrutinib (IMBRUVICA) 420 MG TABS Take 420 mg by mouth daily. 30 tablet 5   No current facility-administered medications for this visit.    OBJECTIVE: BP (!) 149/63 (BP Location: Left Arm, Patient Position: Sitting)   Pulse 82   Temp (!) 97.2 F (36.2  C) (Tympanic)   Resp 17   Wt 145 lb (65.8 kg)   SpO2 99%   BMI 28.32 kg/m    Body mass index is 28.32 kg/m.    ECOG FS:0 - Asymptomatic   General: Well-developed, well-nourished, no acute distress. Eyes: Pink conjunctiva, anicteric sclera. HEENT: Normocephalic, moist mucous membranes.  Easily palpable left cervical lymph node. Lungs: No audible wheezing or coughing. Heart: Regular rate and rhythm. Abdomen: Soft, nontender, no obvious distention. Musculoskeletal: No edema, cyanosis, or clubbing. Neuro: Alert, answering all questions appropriately. Cranial nerves grossly intact. Skin: No rashes or petechiae noted. Psych: Normal affect.    LAB RESULTS:  Appointment on 11/17/2019  Component Date Value Ref Range Status  . Sodium 11/17/2019 139  135 - 145 mmol/L Final  . Potassium 11/17/2019 4.3  3.5 - 5.1 mmol/L Final  . Chloride 11/17/2019 105  98 - 111 mmol/L Final  . CO2 11/17/2019 26  22 - 32 mmol/L Final  . Glucose, Bld 11/17/2019 107* 70 - 99 mg/dL Final   Glucose reference range applies only to samples taken after fasting for at least 8 hours.  . BUN 11/17/2019 22  8 - 23 mg/dL Final  . Creatinine, Ser 11/17/2019 0.92  0.44 - 1.00 mg/dL Final  . Calcium 11/17/2019 8.6* 8.9 - 10.3 mg/dL Final  . Total Protein 11/17/2019 6.5  6.5 - 8.1 g/dL Final  . Albumin 11/17/2019 3.8  3.5 - 5.0 g/dL Final  . AST 11/17/2019 24  15 - 41 U/L Final  . ALT 11/17/2019 16  0 - 44 U/L Final  . Alkaline Phosphatase 11/17/2019 97  38 - 126 U/L Final  . Total Bilirubin 11/17/2019 0.5  0.3 - 1.2 mg/dL Final  . GFR calc non Af Amer 11/17/2019 58* >60 mL/min Final  . GFR calc Af Amer 11/17/2019 >60  >60 mL/min Final  . Anion gap 11/17/2019 8  5 - 15 Final   Performed at [facility], [address], [place], [place] [address]  . WBC 11/17/2019 35.6* 4.0 - 10.5 K/uL Final  . RBC 11/17/2019 4.35  3.87 - 5.11 MIL/uL Final  . Hemoglobin 11/17/2019 11.9* 12.0 - 15.0 g/dL Final  . HCT  11/17/2019 37.1  36.0 - 46.0 % Final  . MCV 11/17/2019 85.3  80.0 - 100.0 fL Final  . MCH 11/17/2019 27.4  26.0 - 34.0 pg Final  . MCHC 11/17/2019 32.1  30.0 - 36.0 g/dL Final  . RDW 11/17/2019 14.2  11.5 - 15.5 % Final  . Platelets 11/17/2019 233  150 - 400 K/uL Final  . nRBC 11/17/2019 0.0  0.0 - 0.2 % Final  . Neutrophils Relative % 11/17/2019 18  % Final  . Neutro Abs 11/17/2019 6.4  1.7 - 7.7 K/uL Final  . Lymphocytes Relative 11/17/2019 80  % Final  . Lymphs Abs 11/17/2019 28.5* 0.7 - 4.0 K/uL Final  . Monocytes Relative 11/17/2019 2  % Final  . Monocytes Absolute 11/17/2019 0.7  0.1 - 1.0 K/uL Final  . Eosinophils Relative 11/17/2019 0  % Final  . Eosinophils Absolute 11/17/2019 0.0  0.0 - 0.5 K/uL Final  . Basophils Relative 11/17/2019 0  % Final  . Basophils Absolute 11/17/2019 0.0  0.0 - 0.1 K/uL Final  . WBC Morphology 11/17/2019 Abnormal lymphocytes present   Final   DIFF CONFIRMED BY MANUAL. CONSISTANT WITH KNOWN CLL  . RBC Morphology 11/17/2019 UNREMARKABLE   Final  . Smear Review 11/17/2019 Normal platelet morphology   Final   PLATELETS APPEAR ADEQUATE  . Abs Immature Granulocytes 11/17/2019 0.00  0.00 - 0.07 K/uL Final   Performed at [facility], [address] [place], [place] [address]    STUDIES: No results found.  ASSESSMENT: Progressive CLL.  PLAN:    1. 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  Patient's white blood cell count continues to trend up and is now 35.6.  CT scan results from October 18, 2019 reviewed independently and reported as above with widespread progressive lymphadenopathy.  Lymph node biopsy confirmed diagnosis.  Given patient has progressive disease, will initiate Imbruvica 420 mg daily until intolerable side effects or progression of disease.  Patient will return to clinic in 4 weeks for repeat laboratory work and assess her toleration of treatment.     I  spent a total of 30 minutes reviewing chart data, face-to-face evaluation with the patient, counseling and coordination of care as detailed above.  Patient expressed understanding and was in agreement with this plan. She also understands that She can call clinic at any time with any questions, concerns, or complaints.    Lloyd Huger, MD   11/17/2019 3:26 PM

## 2019-11-17 ENCOUNTER — Inpatient Hospital Stay (HOSPITAL_BASED_OUTPATIENT_CLINIC_OR_DEPARTMENT_OTHER): Payer: PPO | Admitting: Oncology

## 2019-11-17 ENCOUNTER — Other Ambulatory Visit: Payer: Self-pay | Admitting: Emergency Medicine

## 2019-11-17 ENCOUNTER — Inpatient Hospital Stay: Payer: PPO | Attending: Oncology

## 2019-11-17 ENCOUNTER — Telehealth: Payer: Self-pay | Admitting: Pharmacist

## 2019-11-17 ENCOUNTER — Telehealth: Payer: Self-pay | Admitting: Pharmacy Technician

## 2019-11-17 ENCOUNTER — Other Ambulatory Visit: Payer: Self-pay

## 2019-11-17 ENCOUNTER — Encounter: Payer: Self-pay | Admitting: Oncology

## 2019-11-17 VITALS — BP 149/63 | HR 82 | Temp 97.2°F | Resp 17 | Wt 145.0 lb

## 2019-11-17 DIAGNOSIS — R5383 Other fatigue: Secondary | ICD-10-CM | POA: Diagnosis not present

## 2019-11-17 DIAGNOSIS — C9110 Chronic lymphocytic leukemia of B-cell type not having achieved remission: Secondary | ICD-10-CM

## 2019-11-17 DIAGNOSIS — R531 Weakness: Secondary | ICD-10-CM | POA: Diagnosis not present

## 2019-11-17 DIAGNOSIS — I10 Essential (primary) hypertension: Secondary | ICD-10-CM | POA: Insufficient documentation

## 2019-11-17 DIAGNOSIS — E785 Hyperlipidemia, unspecified: Secondary | ICD-10-CM | POA: Diagnosis not present

## 2019-11-17 DIAGNOSIS — Z803 Family history of malignant neoplasm of breast: Secondary | ICD-10-CM | POA: Diagnosis not present

## 2019-11-17 DIAGNOSIS — Z79899 Other long term (current) drug therapy: Secondary | ICD-10-CM | POA: Insufficient documentation

## 2019-11-17 LAB — COMPREHENSIVE METABOLIC PANEL
ALT: 16 U/L (ref 0–44)
AST: 24 U/L (ref 15–41)
Albumin: 3.8 g/dL (ref 3.5–5.0)
Alkaline Phosphatase: 97 U/L (ref 38–126)
Anion gap: 8 (ref 5–15)
BUN: 22 mg/dL (ref 8–23)
CO2: 26 mmol/L (ref 22–32)
Calcium: 8.6 mg/dL — ABNORMAL LOW (ref 8.9–10.3)
Chloride: 105 mmol/L (ref 98–111)
Creatinine, Ser: 0.92 mg/dL (ref 0.44–1.00)
GFR calc Af Amer: >60 mL/min (ref >60)
GFR calc non Af Amer: 58 mL/min — ABNORMAL LOW (ref >60)
Glucose, Bld: 107 mg/dL — ABNORMAL HIGH (ref 70–99)
Potassium: 4.3 mmol/L (ref 3.5–5.1)
Sodium: 139 mmol/L (ref 135–145)
Total Bilirubin: 0.5 mg/dL (ref 0.3–1.2)
Total Protein: 6.5 g/dL (ref 6.5–8.1)

## 2019-11-17 LAB — CBC WITH DIFFERENTIAL/PLATELET
Abs Immature Granulocytes: 0 10*3/uL (ref 0.00–0.07)
Basophils Absolute: 0 10*3/uL (ref 0.0–0.1)
Basophils Relative: 0 %
Eosinophils Absolute: 0 10*3/uL (ref 0.0–0.5)
Eosinophils Relative: 0 %
HCT: 37.1 % (ref 36.0–46.0)
Hemoglobin: 11.9 g/dL — ABNORMAL LOW (ref 12.0–15.0)
Lymphocytes Relative: 80 %
Lymphs Abs: 28.5 10*3/uL — ABNORMAL HIGH (ref 0.7–4.0)
MCH: 27.4 pg (ref 26.0–34.0)
MCHC: 32.1 g/dL (ref 30.0–36.0)
MCV: 85.3 fL (ref 80.0–100.0)
Monocytes Absolute: 0.7 10*3/uL (ref 0.1–1.0)
Monocytes Relative: 2 %
Neutro Abs: 6.4 10*3/uL (ref 1.7–7.7)
Neutrophils Relative %: 18 %
Platelets: 233 10*3/uL (ref 150–400)
RBC: 4.35 MIL/uL (ref 3.87–5.11)
RDW: 14.2 % (ref 11.5–15.5)
Smear Review: NORMAL
WBC Morphology: ABNORMAL
WBC: 35.6 10*3/uL — ABNORMAL HIGH (ref 4.0–10.5)
nRBC: 0 % (ref 0.0–0.2)

## 2019-11-17 MED ORDER — IMBRUVICA 420 MG PO TABS: 420.0000 mg | ORAL_TABLET | Freq: Every day | ORAL | 5 refills | Status: DC

## 2019-11-17 NOTE — Telephone Encounter (Signed)
Oral Oncology Patient Advocate Encounter  Prior Authorization for Barbara Chambers has been approved.    PA# [id number] Effective dates: 11/17/19 through 11/16/20  Patients co-pay is [id number]  Oral Oncology Clinic will continue to follow.   [place] Patient [place] Phone [phone] Fax [phone] 11/17/2019 4:04 PM

## 2019-11-17 NOTE — Telephone Encounter (Signed)
Oral Oncology Patient Advocate Encounter   Received notification from [employer] that prior authorization for Imbruvica is required.   PA submitted on CoverMyMeds Key [id number]  Status is pending   Oral Oncology Clinic will continue to follow.  [place] Patient [place] Phone [phone] Fax [phone] 11/17/2019 1:38 PM

## 2019-11-17 NOTE — Telephone Encounter (Signed)
Oral Oncology Pharmacist Encounter  Received new prescription for Imbruvica (ibrutinib) for the treatment of CLL, planned duration until disease progression or unacceptable drug toxicity.  CMP from 11/17/19 assessed, no relevant lab abnormalities. Prescription dose and frequency assessed.   Current medication list in Epic reviewed, no relevant DDIs with ibrutinib identified.  Prescription has been e-scribed to the [facility] for benefits analysis and approval.  Oral Oncology Clinic will continue to follow for insurance authorization, copayment issues, initial counseling and start date.  Darl Pikes, PharmD, BCPS, BCOP, CPP Hematology/Oncology Clinical Pharmacist Practitioner [facility] [place] [facility] [phone]  11/17/2019 11:55 AM

## 2019-11-17 NOTE — Progress Notes (Signed)
Pt here for follow up and biopsy results. Reports that she's recently been having issues with GERD after meals, particularly dinner, has never had GERD before. No other complaints or concerns.

## 2019-11-22 ENCOUNTER — Telehealth: Payer: Self-pay | Admitting: Pharmacy Technician

## 2019-11-22 MED FILL — IMBRUVICA 420 MG TAB: 420 | 28 days supply | Qty: 28 | Fill #0

## 2019-11-22 NOTE — Telephone Encounter (Signed)
Oral Chemotherapy Pharmacist Encounter  [facility] will deliver medication on 11/23/19. They know to get started when the medication is received.  Patient Education I spoke with patient's [name] for overview of new oral chemotherapy medication: Imbruvica (ibrutinib) for the treatment of CLL, planned duration until disease progression or unacceptable drug toxicity.   Counseled Barbara Chambers on administration, dosing, side effects, monitoring, drug-food interactions, safe handling, storage, and disposal. Patient will take 420 mg by mouth daily.  Side effects include but not limited to: fatigue, diarrhea, N/V, decreased wbd.    Reviewed with Barbara Chambers importance of keeping a medication schedule and plan for any missed doses.  Barbara Chambers voiced understanding and appreciation. All questions answered.  Provided [facility] with Oral Chemotherapy Navigation Clinic phone number. Barbara Chambers knows to call the office with questions or concerns. Oral Chemotherapy Navigation Clinic will continue to follow.  Darl Pikes, PharmD, BCPS, BCOP, CPP Hematology/Oncology Clinical Pharmacist Practitioner [facility] [place] [facility] [phone]  11/22/2019 12:28 PM

## 2019-11-22 NOTE — Telephone Encounter (Signed)
Oral Oncology Patient Advocate Encounter  I spoke with [name], patients son, this afternoon to set up delivery of Imbruvica.  Address verified for shipment.  Kate Sable will be filled through [facility] and mailed 11/22/19 for delivery 11/23/19.    Altha will call 7-10 days before next refill is due to complete adherence call and set up delivery of medication.     [place] Patient [place] Phone [phone] Fax [phone] 11/22/2019 2:57 PM

## 2019-11-22 NOTE — Telephone Encounter (Signed)
Oral Oncology Patient Advocate Encounter  Patient has been approved for copay assistance with [employer] (TAF).  [employer] will cover all copayment expenses for Imbruvica for the remainder of the calendar year.    The billing information is as follows and has been shared with [facility].   Member ID: [id number] Group ID: [id number] PCN: [id number] BIN: [id number] Eligibility Dates: 11/22/2019 to 06/28/2020  Fund: Chronic Lymphocytic [place] Patient [place] Phone [phone] Fax [phone] 11/22/2019 9:56 AM

## 2019-11-23 ENCOUNTER — Other Ambulatory Visit: Payer: Self-pay | Admitting: Family Medicine

## 2019-11-30 ENCOUNTER — Other Ambulatory Visit: Payer: Self-pay | Admitting: Neurology

## 2019-11-30 DIAGNOSIS — E519 Thiamine deficiency, unspecified: Secondary | ICD-10-CM | POA: Diagnosis not present

## 2019-11-30 DIAGNOSIS — R413 Other amnesia: Secondary | ICD-10-CM | POA: Diagnosis not present

## 2019-11-30 DIAGNOSIS — R410 Disorientation, unspecified: Secondary | ICD-10-CM | POA: Diagnosis not present

## 2019-11-30 DIAGNOSIS — C9110 Chronic lymphocytic leukemia of B-cell type not having achieved remission: Secondary | ICD-10-CM | POA: Diagnosis not present

## 2019-11-30 DIAGNOSIS — E538 Deficiency of other specified B group vitamins: Secondary | ICD-10-CM | POA: Diagnosis not present

## 2019-12-01 ENCOUNTER — Other Ambulatory Visit: Payer: Self-pay | Admitting: Family Medicine

## 2019-12-01 DIAGNOSIS — B89 Unspecified parasitic disease: Secondary | ICD-10-CM

## 2019-12-01 MED ORDER — OLANZAPINE 2.5 MG PO TABS: 2.5000 mg | ORAL_TABLET | Freq: Every day | ORAL | 2 refills | Status: DC

## 2019-12-01 NOTE — Telephone Encounter (Signed)
PT needs a refill   OLANZapine (ZYPREXA) 2.5 MG tablet [[id number]]  [id number] - [place], [place] - [address]  [address] [place] [address]  Phone: [phone] Fax: [phone]

## 2019-12-01 NOTE — Telephone Encounter (Signed)
Requested medication (s) are due for refill today: Yes  Requested medication (s) are on the active medication list: Yes  Last refill:  07/12/19  Future visit scheduled: Yes  Notes to clinic:  See request.    Requested Prescriptions  Pending Prescriptions Disp Refills   OLANZapine (ZYPREXA) 2.5 MG tablet 30 tablet 2    Sig: Take 1 tablet (2.5 mg total) by mouth at bedtime.      Not Delegated - Psychiatry:  Antipsychotics - Second Generation (Atypical) - olanzapine Failed - 12/01/2019  1:08 PM      Failed - This refill cannot be delegated      Failed - Last BP in normal range    BP Readings from Last 1 Encounters:  11/17/19 (!) 149/63          Passed - ALT in normal range and within 360 days    ALT  Date Value Ref Range Status  11/17/2019 16 0 - 44 U/L Final   SGPT (ALT)  Date Value Ref Range Status  02/15/2012 27 12 - 78 U/L Final          Passed - AST in normal range and within 360 days    AST  Date Value Ref Range Status  11/17/2019 24 15 - 41 U/L Final   SGOT(AST)  Date Value Ref Range Status  02/15/2012 25 15 - 37 Unit/L Final          Passed - Valid encounter within last 6 months    Recent Outpatient Visits           2 months ago Encounter for annual physical exam   [facility], [name], MD   3 months ago [place], [name], MD   4 months ago Dry skin dermatitis   [facility] [place], [name], MD   6 months ago Benign essential HTN   [facility], [name], MD   8 months ago Possible urinary tract infection   [facility], [name], MD       Future Appointments             In 3 months [name], [name], MD [facility] [place]

## 2019-12-04 ENCOUNTER — Other Ambulatory Visit: Payer: Self-pay

## 2019-12-04 ENCOUNTER — Ambulatory Visit: Payer: PPO | Admitting: Pharmacist

## 2019-12-04 DIAGNOSIS — I10 Essential (primary) hypertension: Secondary | ICD-10-CM

## 2019-12-04 DIAGNOSIS — E785 Hyperlipidemia, unspecified: Secondary | ICD-10-CM

## 2019-12-04 NOTE — Chronic Care Management (AMB) (Signed)
Chronic Care Management Pharmacy  Name: Barbara Chambers  MRN: [id number] DOB: [birth year]  Chief Complaint/ HPI  [name],  85 y.o. , female presents for their Initial CCM visit with the clinical pharmacist via telephone due to COVID-19 Pandemic.  PCP : [name], MD  Their chronic conditions include: HTN, HLD, psychosis, dementia, CLL  Office Visits: 4/1 Annual, [name], BP 124/65 P 68 Wt 145 BMI 28.3, delusions, UTI neg, not taking Zyprexa as prescribed 3/5 Rosacea, [name], BP 162/79 P 72, see dermatologist 11/12 HTN, [name], BP 123/69 P 67, stopped Cymbalta  Consult Visit: 6/3 memory loss, Manuella Ghazi, BP 149/71 P 70, [place]y Body, MIND diet 5/21 CLL, [name], BP 149/63 P 82 Wt 145 Ht 60 BMI 28.3, WBC inc, start Imbruvica 420 mg daily 5/13 SOBOE, [place], BP 130/80 SOB edema, support stockings, statin, DASH  Medications: Outpatient Encounter Medications as of 12/04/2019  Medication Sig  . amLODipine (NORVASC) 5 MG tablet TAKE 1 TABLET BY MOUTH EVERY DAY  . aspirin 81 MG chewable tablet Chew 81 mg by mouth daily.  [name] COENZYME Q-10 PO Take 200 mg by mouth daily.   . DULoxetine (CYMBALTA) 20 MG capsule Take 20 mg by mouth daily.  . Ibrutinib (IMBRUVICA) 420 MG TABS Take 420 mg by mouth daily.  [name] losartan (COZAAR) 100 MG tablet TAKE 1 TABLET BY MOUTH EVERY DAY  . metoprolol succinate (TOPROL-XL) 50 MG 24 hr tablet TAKE 1 TABLET BY MOUTH EVERY DAY  . OLANZapine (ZYPREXA) 2.5 MG tablet Take 1 tablet (2.5 mg total) by mouth at bedtime.  . simvastatin (ZOCOR) 10 MG tablet Take 1 tablet (10 mg total) by mouth at bedtime.   No facility-administered encounter medications on file as of 12/04/2019.      Financial Resource Strain: Low Risk   . Difficulty of Paying Living Expenses: Not hard at all   Current Diagnosis/Assessment:  Goals Addressed            This Visit's Progress   . Chronic Care Management       CARE PLAN ENTRY  Current Barriers:  . Chronic  Disease Management support, education, and care coordination needs related to hyperlipidemia, hypertension, and cognition .   Hypertension . Pharmacist Clinical Goal(s): o Over the next 90 days, patient will work with PharmD and providers to achieve BP goal <130/80 . Current regimen:  o Amlodipine 5 mg daily, losartan 100mg  daily, Toprol XL 50mg  daily o All taken in the morning . Interventions: o Counseled that med adjustment difficult due to diastolic 56 - 63 o Take metoprolol succinate 50mg  at bedtime . Patient self care activities - Over the next 90 days, patient will: o Check BP daily, document, and provide at future appointments o Ensure daily salt intake < 2300 mg/day  Hyperlipidemia . Pharmacist Clinical Goal(s): o Over the next 90 days, patient will work with PharmD and providers to achieve LDL goal < 70 o Per neurology, low LDL important for memory . Current regimen:  o Simvastatin 10mg  at bedtime . Interventions: o Stop simvastatin o Start atorvastatin 40mg  1 tab by mouth daily . Patient self care activities - Over the next 90 days, patient will: o Take atorvastatin daily o Report any unexplained muscle pain to MD or PharmD  Cognition . Pharmacist Clinical Goal(s) o Over the next 90 days, patient will work with PharmD and providers to reduce unwanted thoughts . Current regimen:  o Zyprexa 2.5mg  with snack at bedtime . Interventions: o None .  Patient self care activities - Over the next 90 days, patient will: o Continue taking Zyprexa with a light snack at night  Medication management . Pharmacist Clinical Goal(s): o Over the next 90 days, patient will work with PharmD and providers to maintain optimal medication adherence . [facility] . Interventions o Comprehensive medication review performed. o Continue current medication management strategy . Patient self care activities - Over the next 90 days, patient will: o Focus on medication adherence by  working with son o Take medications as prescribed o Report any questions or concerns to PharmD and/or provider(s)  Initial goal documentation       Hypertension   BP uncontrolled on  Losartan 100mg  daily, metoprolol 50mg  daily, amlodipine 5mg  daily   Office blood pressures are  BP Readings from Last 3 Encounters:  11/17/19 (!) 149/63  10/31/19 (!) 125/56  10/19/19 (!) 165/61    Patient has failed these meds in the past: HCTZ hyponatremia  Patient checks BP at home daily  Patient home BP readings are ranging: 126/52, 119/59  We discussed: Elevated BP [place], beginning of Pandemic Worried about potassium in losartan After taking BP meds, feel like going backwards Feet are swollen Wears compression stockings  Plan  Take metoprolol at bedtime Continue current medications   Hyperlipidemia   Lipid Panel     Component Value Date/Time   CHOL 179 09/28/2019 0936   TRIG 119 09/28/2019 0936   HDL 50 09/28/2019 0936   LDLCALC 108 (H) 09/28/2019 0936     The ASCVD Risk score Mikey Bussing DC Jr., et al., 2013) failed to calculate for the following reasons:   The 2013 ASCVD risk score is only valid for ages 23 to 100   Patient has failed these meds in past: NA Patient is currently uncontrolled on the following medications:  . Simvastatin 10mg  qhs  We discussed:   Neurology recommended aggressive lipid lowering for memory. Doesn't know why  Venia Minks started Co-Q10  Plan D/c simvastatin Start atorvastatin 40 mg daily Continue current medications   Cognition    Patient has failed these meds in past: NA Patient is currently controlled on the following medications: Zyprexa  We discussed:   Zyprexa not effective, per patient Per son, was hallucinating constantly Used to talk without patient Taking Cymbalta, trouble with exterminator, he did the job, still saw bedbugs  Plan  Continue current medications  Medication Management   [facility] Uses [facility] box? Yes Pt endorses 100% compliance [employer], not cheaper, maybe later  We discussed: imbruvica with food Zyprexa with snack at night  Spoke with son  Plan  Continue current medication management strategy  Follow up: 3 month phone visit

## 2019-12-05 NOTE — Patient Instructions (Addendum)
Visit Information  Goals Addressed            This Visit's Progress   . Chronic Care Management       CARE PLAN ENTRY  Current Barriers:  . Chronic Disease Management support, education, and care coordination needs related to hyperlipidemia, hypertension, and cognition .   Hypertension . Pharmacist Clinical Goal(s): o Over the next 90 days, patient will work with PharmD and providers to achieve BP goal <130/80 . Current regimen:  o Amlodipine 5 mg daily, losartan 100mg  daily, Toprol XL 50mg  daily o All taken in the morning . Interventions: o Counseled that med adjustment difficult due to diastolic 56 - 63 o Take metoprolol succinate 50mg  at bedtime . Patient self care activities - Over the next 90 days, patient will: o Check BP daily, document, and provide at future appointments o Ensure daily salt intake < 2300 mg/day  Hyperlipidemia . Pharmacist Clinical Goal(s): o Over the next 90 days, patient will work with PharmD and providers to achieve LDL goal < 70 o Per neurology, low LDL important for memory . Current regimen:  o Simvastatin 10mg  at bedtime . Interventions: o Stop simvastatin o Start atorvastatin 40mg  1 tab by mouth daily . Patient self care activities - Over the next 90 days, patient will: o Take atorvastatin daily o Report any unexplained muscle pain to MD or PharmD  Cognition . Pharmacist Clinical Goal(s) o Over the next 90 days, patient will work with PharmD and providers to reduce unwanted thoughts . Current regimen:  o Zyprexa 2.5mg  with snack at bedtime . Interventions: o None . Patient self care activities - Over the next 90 days, patient will: o Continue taking Zyprexa with a light snack at night  Medication management . Pharmacist Clinical Goal(s): o Over the next 90 days, patient will work with PharmD and providers to maintain optimal medication adherence . [facility] . Interventions o Comprehensive medication review  performed. o Continue current medication management strategy . Patient self care activities - Over the next 90 days, patient will: o Focus on medication adherence by working with son o Take medications as prescribed o Report any questions or concerns to PharmD and/or provider(s)  Initial goal documentation        Barbara Chambers was given information about Chronic Care Management services today including:  1. CCM service includes personalized support from designated clinical staff supervised by her physician, including individualized plan of care and coordination with other care providers 2. 24/7 contact phone numbers for assistance for urgent and routine care needs. 3. Standard insurance, coinsurance, copays and deductibles apply for chronic care management only during months in which we provide at least 20 minutes of these services. Most insurances cover these services at 100%, however patients may be responsible for any copay, coinsurance and/or deductible if applicable. This service may help you avoid the need for more expensive face-to-face services. 4. Only one practitioner may furnish and bill the service in a calendar month. 5. The patient may stop CCM services at any time (effective at the end of the month) by phone call to the office staff.  Patient agreed to services and verbal consent obtained.   Print copy of patient instructions provided.  Telephone follow up appointment with pharmacy team member scheduled for: 3 months  Milus Height, PharmD, BCGP, [place] [phone]  Hypertension, Adult Hypertension is another name for high blood pressure. High blood pressure forces your heart to work harder to  pump blood. This can cause problems over time. There are two numbers in a blood pressure reading. There is a top number (systolic) over a bottom number (diastolic). It is best to have a blood pressure that is below 120/80. Healthy choices can  help lower your blood pressure, or you may need medicine to help lower it. What are the causes? The cause of this condition is not known. Some conditions may be related to high blood pressure. What increases the risk?  Smoking.  Having type 2 diabetes mellitus, high cholesterol, or both.  Not getting enough exercise or physical activity.  Being overweight.  Having too much fat, sugar, calories, or salt (sodium) in your diet.  Drinking too much alcohol.  Having long-term (chronic) kidney disease.  Having a family history of high blood pressure.  Age. Risk increases with age.  Race. You may be at higher risk if you are African American.  Gender. Men are at higher risk than women before age 5. After age 24, women are at higher risk than men.  Having obstructive sleep apnea.  Stress. What are the signs or symptoms?  High blood pressure may not cause symptoms. Very high blood pressure (hypertensive crisis) may cause: ? Headache. ? Feelings of worry or nervousness (anxiety). ? Shortness of breath. ? Nosebleed. ? A feeling of being sick to your stomach (nausea). ? Throwing up (vomiting). ? Changes in how you see. ? Very bad chest pain. ? Seizures. How is this treated?  This condition is treated by making healthy lifestyle changes, such as: ? Eating healthy foods. ? Exercising more. ? Drinking less alcohol.  Your health care provider may prescribe medicine if lifestyle changes are not enough to get your blood pressure under control, and if: ? Your top number is above 130. ? Your bottom number is above 80.  Your personal target blood pressure may vary. Follow these instructions at home: Eating and drinking   If told, follow the DASH eating plan. To follow this plan: ? Fill one half of your plate at each meal with fruits and vegetables. ? Fill one fourth of your plate at each meal with whole grains. Whole grains include whole-wheat pasta, brown rice, and whole-grain  bread. ? Eat or drink low-fat dairy products, such as skim milk or low-fat yogurt. ? Fill one fourth of your plate at each meal with low-fat (lean) proteins. Low-fat proteins include fish, chicken without skin, eggs, beans, and tofu. ? Avoid fatty meat, cured and processed meat, or chicken with skin. ? Avoid pre-made or processed food.  Eat less than 1,500 mg of salt each day.  Do not drink alcohol if: ? Your doctor tells you not to drink. ? You are pregnant, may be pregnant, or are planning to become pregnant.  If you drink alcohol: ? Limit how much you use to:  0-1 drink a day for women.  0-2 drinks a day for men. ? Be aware of how much alcohol is in your drink. In the [place] one drink equals one 12 oz bottle of beer (355 mL), one 5 oz glass of wine (148 mL), or one 1 oz glass of hard liquor (44 mL). Lifestyle   Work with your doctor to stay at a healthy weight or to lose weight. Ask your doctor what the best weight is for you.  Get at least 30 minutes of exercise most days of the week. This may include walking, swimming, or biking.  Get at least 30 minutes of exercise  that strengthens your muscles (resistance exercise) at least 3 days a week. This may include lifting weights or doing Pilates.  Do not use any products that contain nicotine or tobacco, such as cigarettes, e-cigarettes, and chewing tobacco. If you need help quitting, ask your doctor.  Check your blood pressure at home as told by your doctor.  Keep all follow-up visits as told by your doctor. This is important. Medicines  Take over-the-counter and prescription medicines only as told by your doctor. Follow directions carefully.  Do not skip doses of blood pressure medicine. The medicine does not work as well if you skip doses. Skipping doses also puts you at risk for problems.  Ask your doctor about side effects or reactions to medicines that you should watch for. Contact a doctor if you:  Think you are having  a reaction to the medicine you are taking.  Have headaches that keep coming back (recurring).  Feel dizzy.  Have swelling in your ankles.  Have trouble with your vision. Get help right away if you:  Get a very bad headache.  Start to feel mixed up (confused).  Feel weak or numb.  Feel faint.  Have very bad pain in your: ? Chest. ? Belly (abdomen).  Throw up more than once.  Have trouble breathing. Summary  Hypertension is another name for high blood pressure.  High blood pressure forces your heart to work harder to pump blood.  For most people, a normal blood pressure is less than 120/80.  Making healthy choices can help lower blood pressure. If your blood pressure does not get lower with healthy choices, you may need to take medicine. This information is not intended to replace advice given to you by your health care provider. Make sure you discuss any questions you have with your health care provider. Document Revised: 02/23/2018 Document Reviewed: 02/23/2018 Elsevier Patient Education  2020 [employer].

## 2019-12-06 DIAGNOSIS — R0602 Shortness of breath: Secondary | ICD-10-CM | POA: Diagnosis not present

## 2019-12-07 ENCOUNTER — Telehealth: Payer: Self-pay

## 2019-12-07 MED ORDER — ATORVASTATIN CALCIUM 40 MG PO TABS
40.0000 mg | ORAL_TABLET | Freq: Every day | ORAL | 1 refills | Status: DC
Start: 2019-12-07 — End: 2020-05-27

## 2019-12-07 NOTE — Telephone Encounter (Signed)
-----   Message from [name], MD sent at 12/06/2019 10:56 AM EDT ----- Regarding: RE: Clinical Pharmacy Recommendations Thank you. I will f/u at my next visit with her.  CMAs, ok to send Atorvastatin 40mg  daily if patient agrees ----- Message ----- From: Verdia Kuba, [facility] Sent: 12/05/2019   8:48 PM EDT To: [name], MD Subject: Clinical Pharmacy Recommendations              Hello Dr. [name],  I had a phone visit with Ms Arvin and then a brief phone call with her son to better assess her mental health.  1. I don't have any solid suggestions about her elevated BP since her home diastolic is so low and can't tolerate HCTZ (for ISH). She was taking all medications in the morning, so I asked her to take her Toprol XL at bedtime.  2. Her neurologist suggested very tight lipid management in his recent note. She is currently not at goal, but is already on CoQ10 for some myalgia protection. Recommendation: D/c simvastatin 10mg  Start atorvastatin 40mg  1 tab by mouth daily  3. At the time of the call she had not yet picked up and started Exelon. Counseled son on potential conflict between dopamine blockage (Zyprexa) and dopaminergic Exelon. Fortunately the Exelon is dosed twice daily for Lewy Body dementia.  Thanks,  Sheppard Plumber, PharmD, [place], [place] [phone]

## 2019-12-07 NOTE — Telephone Encounter (Signed)
Spoke with Pt's son he agreed to have her start Atorvastatin.  [facility].   Thanks,   -Mickel Baas

## 2019-12-11 DIAGNOSIS — G3183 Dementia with Lewy bodies: Secondary | ICD-10-CM | POA: Diagnosis not present

## 2019-12-11 DIAGNOSIS — S0990XA Unspecified injury of head, initial encounter: Secondary | ICD-10-CM | POA: Diagnosis not present

## 2019-12-11 DIAGNOSIS — E785 Hyperlipidemia, unspecified: Secondary | ICD-10-CM | POA: Diagnosis not present

## 2019-12-11 DIAGNOSIS — I10 Essential (primary) hypertension: Secondary | ICD-10-CM | POA: Diagnosis not present

## 2019-12-11 DIAGNOSIS — E7800 Pure hypercholesterolemia, unspecified: Secondary | ICD-10-CM | POA: Diagnosis not present

## 2019-12-11 DIAGNOSIS — Z880 Allergy status to penicillin: Secondary | ICD-10-CM | POA: Diagnosis not present

## 2019-12-11 DIAGNOSIS — S199XXA Unspecified injury of neck, initial encounter: Secondary | ICD-10-CM | POA: Diagnosis not present

## 2019-12-11 DIAGNOSIS — Z8572 Personal history of non-Hodgkin lymphomas: Secondary | ICD-10-CM | POA: Diagnosis not present

## 2019-12-11 DIAGNOSIS — F0280 Dementia in other diseases classified elsewhere without behavioral disturbance: Secondary | ICD-10-CM | POA: Diagnosis not present

## 2019-12-11 DIAGNOSIS — F329 Major depressive disorder, single episode, unspecified: Secondary | ICD-10-CM | POA: Diagnosis not present

## 2019-12-11 DIAGNOSIS — S0001XA Abrasion of scalp, initial encounter: Secondary | ICD-10-CM | POA: Diagnosis not present

## 2019-12-11 DIAGNOSIS — Z79899 Other long term (current) drug therapy: Secondary | ICD-10-CM | POA: Diagnosis not present

## 2019-12-12 NOTE — Progress Notes (Signed)
[place]  [phone]  [phone]     Barbara Chambers DOB: [birth year]  MR#: [id number]  YTK#:[id number]  Patient Care Team: [name], MD as PCP - General (Family Medicine) Lloyd Huger, MD as Consulting Physician (Oncology) Corey Skains, MD as Consulting Physician (Cardiology) Brendolyn Patty, MD as Consulting Physician (Dermatology) Birder Robson, MD as Referring Physician (Ophthalmology) Verdia Kuba, [facility] (Pharmacist)   CHIEF COMPLAINT: Progressive CLL.    INTERVAL HISTORY: Patient returns to clinic today for further evaluation and to assess her toleration of Ibrance.  She continues have chronic weakness and fatigue.  She also complains of a poor memory.  She otherwise feels well. She has no neurologic complaints. She denies any fevers, night sweats, or weight loss. She denies any chest pain, shortness of breath, cough, or hemoptysis.  She denies any nausea, vomiting, constipation, or diarrhea. She has no urinary complaints.  Patient offers no further specific complaints today.  REVIEW OF SYSTEMS:   Review of Systems  Constitutional: Positive for malaise/fatigue. Negative for diaphoresis, fever and weight loss.  Respiratory: Negative.  Negative for cough and shortness of breath.   Cardiovascular: Negative.  Negative for chest pain and leg swelling.  Gastrointestinal: Negative.  Negative for abdominal pain.  Genitourinary: Negative.  Negative for dysuria.  Musculoskeletal: Negative.  Negative for back pain.  Skin: Negative.  Negative for rash.  Neurological: Positive for weakness. Negative for dizziness, sensory change, focal weakness and headaches.  Psychiatric/Behavioral: Positive for memory loss. Negative for depression. The patient is not nervous/anxious.     As per HPI. Otherwise, a complete review of systems is negative.  PAST MEDICAL HISTORY: Past Medical History:  Diagnosis Date  . Chronic lymphocytic  leukemia ([place]) 2011  . Hyperlipidemia   . Hypertension     PAST SURGICAL HISTORY: Past Surgical History:  Procedure Laterality Date  . cataract surgery Bilateral 2005  . CHOLECYSTECTOMY  1991  . RECONSTRUCTION OF EYELID      FAMILY HISTORY Family History  Problem Relation Age of Onset  . Hypertension Mother   . Stroke Mother   . CAD Mother   . Hypertension Father   . Heart attack Father   . Hypertension Brother   . Seizures Brother   . Hypertension Son   . Breast cancer Neg Hx     GYNECOLOGIC HISTORY:  No LMP recorded. Patient is postmenopausal.     ADVANCED DIRECTIVES:    HEALTH MAINTENANCE: Social History   Tobacco Use  . Smoking status: Never Smoker  . Smokeless tobacco: Never Used  Vaping Use  . Vaping Use: Never used  Substance Use Topics  . Alcohol use: No  . Drug use: No     Colonoscopy:  PAP:  Bone density:  Lipid panel:  Allergies  Allergen Reactions  . Hctz [Hydrochlorothiazide]     Hyponatremia  . Penicillins Rash    Current Outpatient Medications  Medication Sig Dispense Refill  . amLODipine (NORVASC) 5 MG tablet TAKE 1 TABLET BY MOUTH EVERY DAY 90 tablet 1  . aspirin 81 MG chewable tablet Chew 81 mg by mouth daily.    [name] atorvastatin (LIPITOR) 40 MG tablet Take 1 tablet (40 mg total) by mouth daily. 90 tablet 1  . COENZYME Q-10 PO Take 200 mg by mouth daily.     . DULoxetine (CYMBALTA) 20 MG capsule Take 20 mg by mouth daily.    . Ibrutinib (IMBRUVICA) 420 MG TABS Take 420 mg by mouth  daily. 28 tablet 5  . losartan (COZAAR) 100 MG tablet TAKE 1 TABLET BY MOUTH EVERY DAY 90 tablet 3  . metoprolol succinate (TOPROL-XL) 50 MG 24 hr tablet TAKE 1 TABLET BY MOUTH EVERY DAY 90 tablet 1  . OLANZapine (ZYPREXA) 2.5 MG tablet Take 1 tablet (2.5 mg total) by mouth at bedtime. 30 tablet 2  . rivastigmine (EXELON) 1.5 MG capsule Take 1.5 mg by mouth 2 (two) times daily.     No current facility-administered medications for this visit.     OBJECTIVE: BP (!) 137/52   Pulse 64   Temp 98.2 F (36.8 C)   Resp 20   Wt 144 lb 11.2 oz (65.6 kg)   SpO2 99%   BMI 28.26 kg/m    Body mass index is 28.26 kg/m.    ECOG FS:0 - Asymptomatic   General: Well-developed, well-nourished, no acute distress. Eyes: Pink conjunctiva, anicteric sclera. HEENT: Normocephalic, moist mucous membranes. Lungs: No audible wheezing or coughing. Heart: Regular rate and rhythm. Abdomen: Soft, nontender, no obvious distention. Musculoskeletal: No edema, cyanosis, or clubbing. Neuro: Alert, answering all questions appropriately. Cranial nerves grossly intact. Skin: No rashes or petechiae noted. Psych: Normal affect.   LAB RESULTS:  No visits with results within 3 Day(s) from this visit.  Latest known visit with results is:  Appointment on 11/17/2019  Component Date Value Ref Range Status  . Sodium 11/17/2019 139  135 - 145 mmol/L Final  . Potassium 11/17/2019 4.3  3.5 - 5.1 mmol/L Final  . Chloride 11/17/2019 105  98 - 111 mmol/L Final  . CO2 11/17/2019 26  22 - 32 mmol/L Final  . Glucose, Bld 11/17/2019 107* 70 - 99 mg/dL Final   Glucose reference range applies only to samples taken after fasting for at least 8 hours.  . BUN 11/17/2019 22  8 - 23 mg/dL Final  . Creatinine, Ser 11/17/2019 0.92  0.44 - 1.00 mg/dL Final  . Calcium 11/17/2019 8.6* 8.9 - 10.3 mg/dL Final  . Total Protein 11/17/2019 6.5  6.5 - 8.1 g/dL Final  . Albumin 11/17/2019 3.8  3.5 - 5.0 g/dL Final  . AST 11/17/2019 24  15 - 41 U/L Final  . ALT 11/17/2019 16  0 - 44 U/L Final  . Alkaline Phosphatase 11/17/2019 97  38 - 126 U/L Final  . Total Bilirubin 11/17/2019 0.5  0.3 - 1.2 mg/dL Final  . GFR calc non Af Amer 11/17/2019 58* >60 mL/min Final  . GFR calc Af Amer 11/17/2019 >60  >60 mL/min Final  . Anion gap 11/17/2019 8  5 - 15 Final   Performed at [facility], [address] [place], [place] [address]  . WBC 11/17/2019 35.6* 4.0 - 10.5 K/uL Final  . RBC  11/17/2019 4.35  3.87 - 5.11 MIL/uL Final  . Hemoglobin 11/17/2019 11.9* 12.0 - 15.0 g/dL Final  . HCT 11/17/2019 37.1  36 - 46 % Final  . MCV 11/17/2019 85.3  80.0 - 100.0 fL Final  . MCH 11/17/2019 27.4  26.0 - 34.0 pg Final  . MCHC 11/17/2019 32.1  30.0 - 36.0 g/dL Final  . RDW 11/17/2019 14.2  11.5 - 15.5 % Final  . Platelets 11/17/2019 233  150 - 400 K/uL Final  . nRBC 11/17/2019 0.0  0.0 - 0.2 % Final  . Neutrophils Relative % 11/17/2019 18  % Final  . Neutro Abs 11/17/2019 6.4  1.7 - 7.7 K/uL Final  . Lymphocytes Relative 11/17/2019 80  %  Final  . Lymphs Abs 11/17/2019 28.5* 0.7 - 4.0 K/uL Final  . Monocytes Relative 11/17/2019 2  % Final  . Monocytes Absolute 11/17/2019 0.7  0 - 1 K/uL Final  . Eosinophils Relative 11/17/2019 0  % Final  . Eosinophils Absolute 11/17/2019 0.0  0 - 0 K/uL Final  . Basophils Relative 11/17/2019 0  % Final  . Basophils Absolute 11/17/2019 0.0  0 - 0 K/uL Final  . WBC Morphology 11/17/2019 Abnormal lymphocytes present   Final   DIFF CONFIRMED BY MANUAL. CONSISTANT WITH KNOWN CLL  . RBC Morphology 11/17/2019 UNREMARKABLE   Final  . Smear Review 11/17/2019 Normal platelet morphology   Final   PLATELETS APPEAR ADEQUATE  . Abs Immature Granulocytes 11/17/2019 0.00  0.00 - 0.07 K/uL Final   Performed at [facility], [address] [place], [place] [id number]    STUDIES: No results found.  ASSESSMENT: Progressive CLL.  PLAN:    1. 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  Patient's white count is essentially unchanged at 33.3.  CT scan results from October 18, 2019 reviewed independently with widespread progressive lymphadenopathy.  Lymph node biopsy confirmed diagnosis.  Patient was initiated Imbruvica 420 mg daily and is tolerating it well.  Continue treatment until progression of disease or intolerable side effects.  No further intervention is needed.  Return to  clinic in 4 weeks for laboratory work and evaluation by clinical pharmacist and then in 8 weeks for laboratory work and further evaluation.  I spent a total of 30 minutes reviewing chart data, face-to-face evaluation with the patient, counseling and coordination of care as detailed above.   Patient expressed understanding and was in agreement with this plan. She also understands that She can call clinic at any time with any questions, concerns, or complaints.    Lloyd Huger, MD   12/16/2019 9:29 AM

## 2019-12-13 ENCOUNTER — Encounter: Payer: Self-pay | Admitting: Oncology

## 2019-12-13 NOTE — Progress Notes (Signed)
Pt coming in for follow up. Reports that she fell getting out of the shower on Monday, unsure how or why she fell, reports that she hit her head. Did go to the [facility] ER to get checked out, reports that everything looked ok. No complaints or concerns for her visit tomorrow.

## 2019-12-14 ENCOUNTER — Inpatient Hospital Stay: Payer: PPO | Admitting: Pharmacist

## 2019-12-14 ENCOUNTER — Other Ambulatory Visit: Payer: Self-pay

## 2019-12-14 ENCOUNTER — Encounter: Payer: Self-pay | Admitting: Oncology

## 2019-12-14 ENCOUNTER — Inpatient Hospital Stay: Payer: PPO | Attending: Oncology | Admitting: Oncology

## 2019-12-14 VITALS — BP 137/52 | HR 64 | Temp 98.2°F | Resp 20 | Wt 144.7 lb

## 2019-12-14 DIAGNOSIS — Z8249 Family history of ischemic heart disease and other diseases of the circulatory system: Secondary | ICD-10-CM | POA: Insufficient documentation

## 2019-12-14 DIAGNOSIS — Z79899 Other long term (current) drug therapy: Secondary | ICD-10-CM | POA: Diagnosis not present

## 2019-12-14 DIAGNOSIS — Z7982 Long term (current) use of aspirin: Secondary | ICD-10-CM | POA: Insufficient documentation

## 2019-12-14 DIAGNOSIS — I10 Essential (primary) hypertension: Secondary | ICD-10-CM | POA: Insufficient documentation

## 2019-12-14 DIAGNOSIS — E785 Hyperlipidemia, unspecified: Secondary | ICD-10-CM | POA: Insufficient documentation

## 2019-12-14 DIAGNOSIS — C9110 Chronic lymphocytic leukemia of B-cell type not having achieved remission: Secondary | ICD-10-CM

## 2019-12-14 NOTE — Progress Notes (Signed)
[place]  Telephone:([phone] [phone]  Patient Care Team: [name], MD as PCP - General (Family Medicine) Lloyd Huger, MD as Consulting Physician (Oncology) Corey Skains, MD as Consulting Physician (Cardiology) Brendolyn Patty, MD as Consulting Physician (Dermatology) Birder Robson, MD as Referring Physician (Ophthalmology) Verdia Kuba, [facility] (Pharmacist)   Name of the patient: Barbara Chambers  [id number]  08-21-1934   Date of visit: 12/14/19  HPI: Patient is a 85 y.o. female with chronic lymphocytic leukemia.  Reason for Consult: Oral chemotherapy follow-up for Imbruvica (ibrutinib) therapy.   PAST MEDICAL HISTORY: Past Medical History:  Diagnosis Date  . Chronic lymphocytic leukemia ([place]) 2011  . Hyperlipidemia   . Hypertension     PAST SURGICAL HISTORY:  Past Surgical History:  Procedure Laterality Date  . cataract surgery Bilateral 2005  . CHOLECYSTECTOMY  1991  . RECONSTRUCTION OF EYELID      HEMATOLOGY/ONCOLOGY HISTORY:  Oncology History   No history exists.    ALLERGIES:  is allergic to hctz [hydrochlorothiazide] and penicillins.  MEDICATIONS:  Current Outpatient Medications  Medication Sig Dispense Refill  . amLODipine (NORVASC) 5 MG tablet TAKE 1 TABLET BY MOUTH EVERY DAY 90 tablet 1  . aspirin 81 MG chewable tablet Chew 81 mg by mouth daily.    [name] atorvastatin (LIPITOR) 40 MG tablet Take 1 tablet (40 mg total) by mouth daily. 90 tablet 1  . COENZYME Q-10 PO Take 200 mg by mouth daily.     . DULoxetine (CYMBALTA) 20 MG capsule Take 20 mg by mouth daily.    . Ibrutinib (IMBRUVICA) 420 MG TABS Take 420 mg by mouth daily. 28 tablet 5  . losartan (COZAAR) 100 MG tablet TAKE 1 TABLET BY MOUTH EVERY DAY 90 tablet 3  . metoprolol succinate (TOPROL-XL) 50 MG 24 hr tablet TAKE 1 TABLET BY MOUTH EVERY DAY 90 tablet 1  . OLANZapine (ZYPREXA) 2.5 MG tablet Take 1 tablet (2.5  mg total) by mouth at bedtime. 30 tablet 2  . rivastigmine (EXELON) 1.5 MG capsule Take 1.5 mg by mouth 2 (two) times daily.     No current facility-administered medications for this visit.    VITAL SIGNS: There were no vitals taken for this visit. There were no vitals filed for this visit.  Estimated body mass index is 28.32 kg/m as calculated from the following:   Height as of 10/31/19: 5' (1.524 m).   Weight as of 11/17/19: 65.8 kg (145 lb).  LABS: CBC:    Component Value Date/Time   WBC 35.6 (H) 11/17/2019 1035   HGB 11.9 (L) 11/17/2019 1035   HGB 12.9 09/28/2019 0936   HCT 37.1 11/17/2019 1035   HCT 41.2 09/28/2019 0936   PLT 233 11/17/2019 1035   PLT 262 09/28/2019 0936   MCV 85.3 11/17/2019 1035   MCV 87 09/28/2019 0936   MCV 84 03/19/2014 1000   NEUTROABS 6.4 11/17/2019 1035   NEUTROABS 6.1 03/01/2019 1050   NEUTROABS 4.9 03/19/2014 1000   LYMPHSABS 28.5 (H) 11/17/2019 1035   LYMPHSABS 17.1 (H) 03/01/2019 1050   LYMPHSABS 8.1 (H) 03/19/2014 1000   MONOABS 0.7 11/17/2019 1035   MONOABS 0.6 03/19/2014 1000   EOSABS 0.0 11/17/2019 1035   EOSABS 0.0 03/01/2019 1050   EOSABS 0.1 03/19/2014 1000   BASOSABS 0.0 11/17/2019 1035   BASOSABS 0.0 03/01/2019 1050   BASOSABS 0.2 (H) 03/19/2014 1000   Comprehensive Metabolic Panel:    Component Value  Date/Time   NA 139 11/17/2019 1035   NA 141 09/28/2019 0936   K 4.3 11/17/2019 1035   CL 105 11/17/2019 1035   CO2 26 11/17/2019 1035   BUN 22 11/17/2019 1035   BUN 16 09/28/2019 0936   CREATININE 0.92 11/17/2019 1035   CREATININE 0.78 04/20/2017 0914   GLUCOSE 107 (H) 11/17/2019 1035   CALCIUM 8.6 (L) 11/17/2019 1035   AST 24 11/17/2019 1035   AST 25 02/15/2012 1101   ALT 16 11/17/2019 1035   ALT 27 02/15/2012 1101   ALKPHOS 97 11/17/2019 1035   ALKPHOS 89 02/15/2012 1101   BILITOT 0.5 11/17/2019 1035   BILITOT 0.4 09/28/2019 0936   BILITOT 0.4 02/15/2012 1101   PROT 6.5 11/17/2019 1035   PROT 6.2 09/28/2019 0936    PROT 6.9 02/15/2012 1101   ALBUMIN 3.8 11/17/2019 1035   ALBUMIN 4.1 09/28/2019 0936   ALBUMIN 3.7 02/15/2012 1101    RADIOGRAPHIC STUDIES: No results found.   Assessment and Plan-  Patient in clinic today with with son. Patient started her ibrutinib on 11/23/19 and is doing well. Patient to continue her ibrutinib.    Oral Chemotherapy Side Effect/Intolerance: no reported side effects  Oral Chemotherapy Adherence: no missed doses  Medication Access Issues: no reported issues.  Patient expressed understanding and was in agreement with this plan. She also understands that She can call clinic at any time with any questions, concerns, or complaints.   Thank you for allowing me to participate in the care of this very pleasant patient.   Time Total: 15 mins  Visit consisted of counseling and education on dealing with issues of symptom management in the setting of serious and potentially life-threatening illness.Greater than 50%  of this time was spent counseling and coordinating care related to the above assessment and plan.  Signed by: Darl Pikes, PharmD, BCPS, Salley Slaughter, CPP Hematology/Oncology Clinical Pharmacist Practitioner [facility]/HP/AP [facility] [phone]  12/14/2019 1:46 PM

## 2019-12-15 DIAGNOSIS — I10 Essential (primary) hypertension: Secondary | ICD-10-CM | POA: Diagnosis not present

## 2019-12-15 DIAGNOSIS — I700 Atherosclerosis of aorta: Secondary | ICD-10-CM | POA: Diagnosis not present

## 2019-12-15 DIAGNOSIS — E782 Mixed hyperlipidemia: Secondary | ICD-10-CM | POA: Diagnosis not present

## 2019-12-15 DIAGNOSIS — R600 Localized edema: Secondary | ICD-10-CM | POA: Diagnosis not present

## 2019-12-15 DIAGNOSIS — R0602 Shortness of breath: Secondary | ICD-10-CM | POA: Diagnosis not present

## 2019-12-18 MED FILL — IMBRUVICA 420 MG TAB: 420 | 28 days supply | Qty: 28 | Fill #1

## 2019-12-20 ENCOUNTER — Encounter (HOSPITAL_COMMUNITY): Payer: Self-pay | Admitting: *Deleted

## 2019-12-20 ENCOUNTER — Ambulatory Visit (HOSPITAL_COMMUNITY)
Admission: RE | Admit: 2019-12-20 | Discharge: 2019-12-20 | Disposition: A | Discharge status: Home / Self Care | Payer: PPO | Source: Ambulatory Visit | Attending: Neurology | Admitting: Neurology

## 2019-12-20 ENCOUNTER — Other Ambulatory Visit: Payer: Self-pay

## 2019-12-20 DIAGNOSIS — R413 Other amnesia: Secondary | ICD-10-CM

## 2019-12-20 DIAGNOSIS — R930 Abnormal findings on diagnostic imaging of skull and head, not elsewhere classified: Secondary | ICD-10-CM | POA: Diagnosis not present

## 2019-12-21 ENCOUNTER — Other Ambulatory Visit: Payer: Self-pay | Admitting: Family Medicine

## 2019-12-21 DIAGNOSIS — I10 Essential (primary) hypertension: Secondary | ICD-10-CM

## 2019-12-21 DIAGNOSIS — R410 Disorientation, unspecified: Secondary | ICD-10-CM | POA: Diagnosis not present

## 2019-12-26 ENCOUNTER — Other Ambulatory Visit: Payer: Self-pay | Admitting: Family Medicine

## 2019-12-26 DIAGNOSIS — B89 Unspecified parasitic disease: Secondary | ICD-10-CM

## 2019-12-26 NOTE — Telephone Encounter (Signed)
Requested medication (s) are due for refill today: no  Requested medication (s) are on the active medication list: yes  Last refill:  12/01/19  Future visit scheduled: no  Notes to clinic:  med not delegated to NT to Rf or refuse (pt asking for med refill too soon)   Requested Prescriptions  Pending Prescriptions Disp Refills   OLANZapine (ZYPREXA) 2.5 MG tablet [Pharmacy Med Name: OLANZAPINE 2.5 MG TABLET] 90 tablet 1    Sig: Take 1 tablet (2.5 mg total) by mouth at bedtime.      Not Delegated - Psychiatry:  Antipsychotics - Second Generation (Atypical) - olanzapine Failed - 12/26/2019  2:44 PM      Failed - This refill cannot be delegated      Passed - ALT in normal range and within 360 days    ALT  Date Value Ref Range Status  11/17/2019 16 0 - 44 U/L Final   SGPT (ALT)  Date Value Ref Range Status  02/15/2012 27 12 - 78 U/L Final          Passed - AST in normal range and within 360 days    AST  Date Value Ref Range Status  11/17/2019 24 15 - 41 U/L Final   SGOT(AST)  Date Value Ref Range Status  02/15/2012 25 15 - 37 Unit/L Final          Passed - Last BP in normal range    BP Readings from Last 1 Encounters:  12/13/19 (!) 137/52          Passed - Valid encounter within last 6 months    Recent Outpatient Visits           2 months ago Encounter for annual physical exam   [facility], [name], MD   3 months ago [facility] [place], [name], MD   5 months ago Dry skin dermatitis   [facility] [place], [name], MD   7 months ago Benign essential HTN   [facility] [place], [name], MD   9 months ago Possible urinary tract infection   [facility], [name], MD       Future Appointments             In 2 months [name], [name], MD [facility], [place]

## 2020-01-01 ENCOUNTER — Other Ambulatory Visit: Payer: Self-pay | Admitting: Family Medicine

## 2020-01-01 DIAGNOSIS — I10 Essential (primary) hypertension: Secondary | ICD-10-CM

## 2020-01-04 ENCOUNTER — Other Ambulatory Visit: Payer: Self-pay | Admitting: Family Medicine

## 2020-01-08 ENCOUNTER — Ambulatory Visit (INDEPENDENT_AMBULATORY_CARE_PROVIDER_SITE_OTHER): Payer: PPO | Admitting: Pharmacist

## 2020-01-08 ENCOUNTER — Other Ambulatory Visit: Payer: Self-pay

## 2020-01-08 DIAGNOSIS — R413 Other amnesia: Secondary | ICD-10-CM

## 2020-01-08 DIAGNOSIS — E785 Hyperlipidemia, unspecified: Secondary | ICD-10-CM

## 2020-01-08 NOTE — Patient Instructions (Addendum)
Visit Information  Goals Addressed            This Visit's Progress   . Chronic Care Management       CARE PLAN ENTRY  Current Barriers:  . Chronic Disease Management support, education, and care coordination needs related to hyperlipidemia, hypertension, and cognition .   Hypertension . Pharmacist Clinical Goal(s): o Over the next 90 days, patient will work with PharmD and providers to achieve BP goal <130/80 . Current regimen:  o Amlodipine 5 mg daily, losartan 171m daily, Toprol XL 530mdaily o All taken in the morning . Interventions: o Counseled that med adjustment difficult due to diastolic 56 - 63 o Take metoprolol succinate 5043mt bedtime . Patient self care activities - Over the next 90 days, patient will: o Check BP daily, document, and provide at future appointments o Ensure daily salt intake < 2300 mg/day  Hyperlipidemia . Pharmacist Clinical Goal(s): o Over the next 90 days, patient will work with PharmD and providers to achieve LDL goal < 70 o Per neurology, low LDL important for memory . Current regimen:  o Simvastatin 43m73m bedtime . Interventions: o Stop simvastatin o Start atorvastatin 40mg43mab by mouth daily o Follow up lab work at 6 weeks . Patient self care activities - Over the next 90 days, patient will: o Report any unexplained muscle pain to MD or PharmD o Continue atorvastatin 40mg 21my  Cognition . Pharmacist Clinical Goal(s) o Over the next 90 days, patient will work with PharmD and providers to reduce unwanted thoughts . Current regimen:  o Zyprexa 2.5mg wi34msnack at bedtime o Exelon 1.5mg twi57mdaily . Interventions: o None . Patient self care activities - Over the next 90 days, patient will: o Continue taking Zyprexa with a light snack at night  Medication management . Pharmacist Clinical Goal(s): o Over the next 90 days, patient will work with PharmD and providers to maintain optimal medication adherence . [facility] . Interventions o Comprehensive medication review performed. o Continue current medication management strategy . Patient self care activities - Over the next 90 days, patient will: o Focus on medication adherence by working with son o Take medications as prescribed o Report any questions or concerns to PharmD and/or provider(s)  Initial goal documentation        Print copy of patient instructions provided.   Telephone follow up appointment with pharmacy team member scheduled for: 3 months  Barbara Chambers, BCGP, CTCassopolisliRitchey-[phone] Someone With Anxiety Anxiety disorders are mental health conditions that cause overwhelming feelings of nervousness or worry. These feelings interfere with daily activities and relationships. Anxiety disorders include:  Generalized anxiety disorder (GAD).  Social anxiety.  Post-traumatic stress disorder (PTSD). When a person has an anxiety disorder, his or her condition can affect others around him or her, such as friends and family members. Friends and family can help by offering support and understanding. What do I need to know about this condition? Anxiety is the mental and physical experience of nervousness or worry that you might feel when you think about a stressful event. Occasional anxiety is normal, but a person with an anxiety disorder becomes preoccupied with this worry. He or she may know that the anxiety is not logical, but knowing this does not relieve the discomfort that he or she feels. Anxiety disorders cause a great deal of distress and prevent someone from having a normal daily  life. Someone with an anxiety disorder may:  Experience anxiety that: ? May or may not have a specific trigger. ? Lasts for long periods of time. ? Causes physical problems over time. ? Is far more intense than normal anticipation. ? Occurs at unpredictable times.  Feel restless  or edgy.  Get fatigued easily.  Have trouble focusing.  Have muscle tension.  Have trouble falling asleep or staying asleep.  Be irritable and occasionally have sudden expressions of strong feelings (outbursts).  Have worries that do not make sense to you. What do I need to know about the treatment options? Anxiety disorders are generally very treatable by mental health providers such as psychologists, psychiatrists, and clinical social workers. Treatment may include one or more of the following:  Psychotherapy, also called talk therapy or counseling. Types of psychotherapy that are used to treat anxiety include: ? Cognitive behavioral therapy (CBT). This type of therapy teaches a person how to recognize unhealthy feelings, thoughts, and behaviors, and how to replace those feelings with positive thoughts and actions. ? Behavior therapy that trains a person to relax and self-soothe. This also involves gradually exposing the person to the cause of the anxiety (progressive exposure therapy). ? Biofeedback. This type of therapy focuses on trying to control certain body functions, like heart rate, to lessen the physical impact of anxiety. ? Mindfulness-based stress reduction training. This uses education, meditation, and yoga to help a person stay focused on the present instead of living in the past or worrying about the future. ? Acceptance and commitment therapy (ACT). This helps a person to focus on acceptance, rather than trying to control every situation. ? Family therapy. This treatment helps family members to communicate and deal with conflict in healthy ways.  Medicine to treat anxiety and help to control certain emotions and behaviors.  Mind-body programs. These programs encourage the person with anxiety to be involved in his or her treatment and feel empowered. Mind-body programs may include mindfulness-based stress reduction training, yoga, or tai chi. How can I create a safe  environment?  For certain types of anxiety, such as PTSD, you may want to: ? Remove alcohol and prescription medicines from your loved one's home, or limit the amount of these substances in the home. This can help to prevent your loved one from abusing alcohol and prescription medicines. ? Remove or lock up guns and other weapons. If you do not have a safe place to keep a gun, local law enforcement may store a gun for you.  Make a written crisis plan. Include important phone numbers, such as the local crisis intervention team. Make sure that: ? The person with anxiety knows about this plan and agrees with it. ? Everyone who has regular contact with the person knows about the plan and knows what to do in an emergency. ? The written plan is easily accessible and can be quickly put into action. How should I care for myself? It is important to find ways to care for your body, mind, and well-being while supporting someone with anxiety.  Try to maintain your normal routines. This can help you remember that your life is about more than your loved one's condition.  Understand what your limits are. Say "no" to requests or events that lead to a schedule that is too busy.  Make time for activities that help you relax, and try to not feel guilty about taking time for yourself.  Spend time with friends and family.  Consider trying meditation  and deep breathing exercises to lower your stress. Attend some mind-body classes by yourself or with your loved one.  Get plenty of sleep.  Exercise, even if it is just taking a short walk a few times a week.  If you are struggling emotionally with guilt, fear, or anger, consider working with a therapist. What are some signs that the condition is getting worse? Signs that your loved one's condition may be getting worse include:  Dramatic mood swings.  Staying away from activities that he or she used to enjoy.  Drinking more alcohol than normal.  Either  seeming tearful or seeming to lack emotion.  Talking about "not feeling right."  Staying away from others (isolating himself or herself). Where to find support Talk about the condition Good communication is the key to supporting your friend or family member. Here are a few things to keep in mind:  Be careful about too much prodding. Try not to overdo reminders to an adult friend or family member about things like taking medicines. Ask how your loved one prefers that you help.  Ask questions and then listen to your loved one's response. Be available if your friend or family member wants to talk, but give your loved one space if he or she does not feel like talking.  Never ignore comments about suicide, and do not try to avoid the subject of suicide. Talking about suicide will not make your loved one want to act on it. You or your loved one can reach out 24 hours a day to get free, private support (on the phone or a live online chat) from a suicide crisis helpline, such as the [place] at (409)707-0632.  Be encouraging and offer emotional support. This can help to lower stress. Even saying something simple to comfort your loved one may help.  If your loved one is open to it, go with him or her to visits with a counselor or health care provider. Get suggestions directly from your loved one's care providers about when to get help if you are concerned about behavior changes. Privacy laws limit how much a person's health care provider can share with you without your loved one's permission, but if you feel that a situation is an emergency, do not wait to call a health care provider or emergency services. Find support and resources  Consider joining self-help and support groups, not only for your friend or family member, but also for yourself. People in these peer and family support groups understand what you and your loved one are going through. They can help you feel a  sense of hope and connect you with local resources to help you learn more. ? You may also consider family therapy. General support  Make an effort to learn all you can about your loved one's form of anxiety.  Include your loved one in activities. Invite him or her to go for walks and outings.  Help your loved one follow his or her treatment plan as directed by health care providers. This could mean driving him or her to therapy sessions or suggesting ways to cope with stress.  Remember that your support really matters. Social support is a huge benefit for someone who is coping with anxiety. Where to find more information A health care provider may be able to recommend mental health resources that are available online or over the phone. You could start with:  Government sites such as the Substance Abuse and [place]  Administration (SAMHSA): ktimeonline.com  [employer] such as the [employer] on [address]): www.nami.org Get help right away if:  Your loved one expresses thoughts about harming himself or herself or others.  Your loved one's behavior becomes hard to predict (erratic).  Your loved one shows behavior that does not make sense with the current time, place, or circumstances. These behaviors may include seeing, feeling, tasting, or hearing things that are not real (hallucinations) or having flashbacks. If you ever feel like your loved one may hurt himself or herself or others, or may have thoughts about taking his or her own life, get help right away. You can go to your nearest emergency department or call:  Your local emergency services (911 in the [place]  A suicide crisis helpline, such as the [place] at 220-567-9223. This is open 24 hours a day. Summary  People with anxiety disorders experience overwhelming feelings of nervousness or worry. Friends and family can help by offering support and  understanding.  Anxiety disorders are generally very treatable by mental health providers. They can be treated with psychotherapy (also known as talk therapy), behavior therapy, medicine, and mind-body programs.  Be compassionate and listen to your loved one. Be available if your friend or family member wants to talk, but give your loved one space if he or she does not feel like talking.  Find ways to care for your own body, mind, and well-being while supporting someone with anxiety. Try to maintain your normal routines and make time to do things that you enjoy. This information is not intended to replace advice given to you by your health care provider. Make sure you discuss any questions you have with your health care provider. Document Revised: 10/06/2018 Document Reviewed: 10/27/2016 Elsevier Patient Education  [place].

## 2020-01-08 NOTE — Progress Notes (Deleted)
   Chronic Care Management Pharmacy  Name: Barbara Chambers  MRN: [id number] DOB: 02/11/1935  Initial Planning Appointment: ***  Initial Questions: 1. Have you seen any other providers since your last visit? {YES/NO:21197} 2. Any changes in your medicines or health? {YES/NO:21197}  Chief Complaint/ HPI  Barbara Chambers,  85 y.o. , female presents for their {Initial/Follow-up:3041532} CCM visit with the clinical pharmacist {CHL [facility] visit PVXY:[id number]}.  PCP : [name], MD  Their chronic conditions include: {CHL AMB CHRONIC MEDICAL CONDITIONS:[phone]}  Office Visits:***  Consult Visit:***  Medications: Outpatient Encounter Medications as of 01/08/2020  Medication Sig  . amLODipine (NORVASC) 5 MG tablet TAKE 1 TABLET BY MOUTH EVERY DAY  . aspirin 81 MG chewable tablet Chew 81 mg by mouth daily.  [name] atorvastatin (LIPITOR) 40 MG tablet Take 1 tablet (40 mg total) by mouth daily.  [name] COENZYME Q-10 PO Take 200 mg by mouth daily.   . DULoxetine (CYMBALTA) 20 MG capsule TAKE 1 CAPSULE BY MOUTH EVERY DAY  . Ibrutinib (IMBRUVICA) 420 MG TABS Take 420 mg by mouth daily.  [name] losartan (COZAAR) 100 MG tablet TAKE 1 TABLET BY MOUTH EVERY DAY  . metoprolol succinate (TOPROL-XL) 50 MG 24 hr tablet TAKE 1 TABLET BY MOUTH EVERY DAY  . OLANZapine (ZYPREXA) 2.5 MG tablet Take 1 tablet (2.5 mg total) by mouth at bedtime.  . rivastigmine (EXELON) 1.5 MG capsule Take 1.5 mg by mouth 2 (two) times daily.   No facility-administered encounter medications on file as of 01/08/2020.     Current Diagnosis/Assessment:  Goals Addressed   None     {CHL [facility] Diagnosis/Assessment:[phone]}

## 2020-01-08 NOTE — Chronic Care Management (AMB) (Signed)
Chronic Care Management Pharmacy  Name: Barbara Chambers  MRN: [id number] DOB: 03/03/1935  Chief Complaint/ HPI  [name],  85 y.o. , female presents for their Follow-Up CCM visit with the clinical pharmacist via telephone due to COVID-19 Pandemic.  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 amLODipine (NORVASC) 5 MG tablet TAKE 1 TABLET BY MOUTH EVERY DAY  . aspirin 81 MG chewable tablet Chew 81 mg by mouth daily.  [name] atorvastatin (LIPITOR) 40 MG tablet Take 1 tablet (40 mg total) by mouth daily.  [name] COENZYME Q-10 PO Take 200 mg by mouth daily.   . DULoxetine (CYMBALTA) 20 MG capsule TAKE 1 CAPSULE BY MOUTH EVERY DAY  . Ibrutinib (IMBRUVICA) 420 MG TABS Take 420 mg by mouth daily.  [name] losartan (COZAAR) 100 MG tablet TAKE 1 TABLET BY MOUTH EVERY DAY  . metoprolol succinate (TOPROL-XL) 50 MG 24 hr tablet TAKE 1 TABLET BY MOUTH EVERY DAY  . OLANZapine (ZYPREXA) 2.5 MG tablet Take 1 tablet (2.5 mg total) by mouth at bedtime.  . rivastigmine (EXELON) 1.5 MG capsule Take 1.5 mg by mouth 2 (two) times daily.   No facility-administered encounter medications on file as of 01/08/2020.     Financial Resource Strain: Low Risk   . Difficulty of Paying Living Expenses: Not hard at all    Current Diagnosis/Assessment:  Goals Addressed            This Visit's Progress   . Chronic Care Management       CARE PLAN ENTRY  Current Barriers:  . Chronic Disease Management support, education, and care coordination needs related to hyperlipidemia, hypertension, and cognition .   Hypertension . Pharmacist Clinical Goal(s): o Over the next 90 days, patient will work with PharmD  and providers to achieve BP goal <130/80 . Current regimen:  o Amlodipine 5 mg daily, losartan 161m daily, Toprol XL 559mdaily o All taken in the morning . Interventions: o Counseled that med adjustment difficult due to diastolic 56 - 63 o Take metoprolol succinate 5029mt bedtime . Patient self care activities - Over the next 90 days, patient will: o Check BP daily, document, and provide at future appointments o Ensure daily salt intake < 2300 mg/day  Hyperlipidemia . Pharmacist Clinical Goal(s): o Over the next 90 days, patient will work with PharmD and providers to achieve LDL goal < 70 o Per neurology, low LDL important for memory . Current regimen:  o Simvastatin 66m42m bedtime . Interventions: o Stop simvastatin o Start atorvastatin 40mg92mab by mouth daily o Follow up lab work at 6 weeks . Patient self care activities - Over the next 90 days, patient will: o Report any unexplained muscle pain to MD or PharmD o Continue atorvastatin 40mg 36my  Cognition . Pharmacist Clinical Goal(s) o Over the next 90 days, patient will work with PharmD and providers to reduce unwanted thoughts . Current regimen:  o Zyprexa 2.5mg wi23msnack at bedtime o Exelon 1.5mg twi36mdaily . Interventions: o None . Patient self care activities - Over the next 90 days, patient will: o Continue taking Zyprexa with a light snack at night  Medication management .  Pharmacist Clinical Goal(s): o Over the next 90 days, patient will work with PharmD and providers to maintain optimal medication adherence . [facility] . Interventions o Comprehensive medication review performed. o Continue current medication management strategy . Patient self care activities - Over the next 90 days, patient will: o Focus on medication adherence by working with son o Take medications as prescribed o Report any questions or concerns to PharmD and/or provider(s)  Initial goal documentation        Hyperlipidemia   LDL goal < 70  Lipid Panel     Component Value Date/Time   CHOL 179 09/28/2019 0936   TRIG 119 09/28/2019 0936   HDL 50 09/28/2019 0936   LDLCALC 108 (H) 09/28/2019 0936    Hepatic Function Latest Ref Rng & Units 11/17/2019 10/19/2019 09/28/2019  Total Protein 6.5 - 8.1 g/dL 6.5 7.4 6.2  Albumin 3.5 - 5.0 g/dL 3.8 4.3 4.1  AST 15 - 41 U/L '24 24 20  ' ALT 0 - 44 U/L '16 17 15  ' Alk Phosphatase 38 - 126 U/L 97 109 117  Total Bilirubin 0.3 - 1.2 mg/dL 0.5 0.6 0.4     The ASCVD Risk score Mikey Bussing DC Jr., et al., 2013) failed to calculate for the following reasons:   The 2013 ASCVD risk score is only valid for ages 77 to 25   Patient has failed these meds in past: simvastatin (not ag goal) Patient is currently controlled on the following medications:  . Lipitor 579m daily  We discussed:  Results of change to intensive therapy not seen in lab work yet.   Plan  [place] lipids and liver function tests at 6 weeks of Lipitor therapy Continue current medications  Cognition    Patient has failed these meds in past: NA Patient is currently controlled on the following medications: Zyprexa 2.527mdaily, Exelon 1.79m93mwice daily  We discussed:   Memory and unwanted thoughts improved.  Plan  Continue current medications  Medication Management   [facility] Uses pill box? Yes Pt endorses 100% compliance  We discussed:  Medications accessible (affordable)  Plan  Continue current medication management strategy  Follow up: 3 month phone visit  TedMilus HeightharmD, BCGRobinwoodTTEagle [phone]

## 2020-01-11 ENCOUNTER — Inpatient Hospital Stay: Payer: PPO | Admitting: Pharmacist

## 2020-01-11 ENCOUNTER — Inpatient Hospital Stay: Payer: PPO

## 2020-01-15 MED FILL — IMBRUVICA 420 MG TAB: 420 | 28 days supply | Qty: 28 | Fill #2

## 2020-01-18 ENCOUNTER — Inpatient Hospital Stay: Payer: PPO

## 2020-01-18 ENCOUNTER — Other Ambulatory Visit: Payer: Self-pay

## 2020-01-18 ENCOUNTER — Inpatient Hospital Stay: Payer: PPO | Attending: Oncology | Admitting: Pharmacist

## 2020-01-18 VITALS — BP 168/71 | HR 77 | Temp 98.0°F | Wt 140.9 lb

## 2020-01-18 DIAGNOSIS — Z7982 Long term (current) use of aspirin: Secondary | ICD-10-CM | POA: Diagnosis not present

## 2020-01-18 DIAGNOSIS — I10 Essential (primary) hypertension: Secondary | ICD-10-CM | POA: Diagnosis not present

## 2020-01-18 DIAGNOSIS — Z79899 Other long term (current) drug therapy: Secondary | ICD-10-CM | POA: Insufficient documentation

## 2020-01-18 DIAGNOSIS — C9110 Chronic lymphocytic leukemia of B-cell type not having achieved remission: Secondary | ICD-10-CM

## 2020-01-18 DIAGNOSIS — E785 Hyperlipidemia, unspecified: Secondary | ICD-10-CM | POA: Insufficient documentation

## 2020-01-18 LAB — CBC WITH DIFFERENTIAL/PLATELET
Abs Immature Granulocytes: 0.14 K/uL — ABNORMAL HIGH (ref 0.00–0.07)
Basophils Absolute: 0.1 K/uL (ref 0.0–0.1)
Basophils Relative: 0 %
Eosinophils Absolute: 0.2 K/uL (ref 0.0–0.5)
Eosinophils Relative: 1 %
HCT: 37.9 % (ref 36.0–46.0)
Hemoglobin: 12.1 g/dL (ref 12.0–15.0)
Immature Granulocytes: 0 %
Lymphocytes Relative: 82 %
Lymphs Abs: 36.9 K/uL — ABNORMAL HIGH (ref 0.7–4.0)
MCH: 26.7 pg (ref 26.0–34.0)
MCHC: 31.9 g/dL (ref 30.0–36.0)
MCV: 83.7 fL (ref 80.0–100.0)
Monocytes Absolute: 1.3 K/uL — ABNORMAL HIGH (ref 0.1–1.0)
Monocytes Relative: 3 %
Neutro Abs: 6.5 K/uL (ref 1.7–7.7)
Neutrophils Relative %: 14 %
Platelets: 237 K/uL (ref 150–400)
RBC: 4.53 MIL/uL (ref 3.87–5.11)
RDW: 14.4 % (ref 11.5–15.5)
Smear Review: ADEQUATE
WBC Morphology: ABNORMAL
WBC: 45.1 K/uL — ABNORMAL HIGH (ref 4.0–10.5)
nRBC: 0 % (ref 0.0–0.2)

## 2020-01-18 LAB — COMPREHENSIVE METABOLIC PANEL WITH GFR
ALT: 16 U/L (ref 0–44)
AST: 23 U/L (ref 15–41)
Albumin: 3.8 g/dL (ref 3.5–5.0)
Alkaline Phosphatase: 79 U/L (ref 38–126)
Anion gap: 7 (ref 5–15)
BUN: 16 mg/dL (ref 8–23)
CO2: 24 mmol/L (ref 22–32)
Calcium: 8.3 mg/dL — ABNORMAL LOW (ref 8.9–10.3)
Chloride: 107 mmol/L (ref 98–111)
Creatinine, Ser: 0.84 mg/dL (ref 0.44–1.00)
GFR calc Af Amer: >60 mL/min
GFR calc non Af Amer: >60 mL/min
Glucose, Bld: 85 mg/dL (ref 70–99)
Potassium: 3.9 mmol/L (ref 3.5–5.1)
Sodium: 138 mmol/L (ref 135–145)
Total Bilirubin: 0.7 mg/dL (ref 0.3–1.2)
Total Protein: 6.6 g/dL (ref 6.5–8.1)

## 2020-01-18 NOTE — Progress Notes (Signed)
[place]  [phone] [phone]  Patient Care Team: [name], MD as PCP - General (Family Medicine) Lloyd Huger, MD as Consulting Physician (Oncology) Corey Skains, MD as Consulting Physician (Cardiology) Brendolyn Patty, MD as Consulting Physician (Dermatology) Birder Robson, MD as Referring Physician (Ophthalmology) Verdia Kuba, [facility] (Pharmacist)   Name of the patient: Barbara Chambers  [id number]  1935/04/18   Date of visit: 01/18/20  HPI: Patient is a 85 y.o. female with chronic lymphocytic leukemia.  Reason for Consult: Oral chemotherapy follow-up for Imbruvica (ibrutinib) therapy.   PAST MEDICAL HISTORY: Past Medical History:  Diagnosis Date  . Chronic lymphocytic leukemia ([place]) 2011  . Hyperlipidemia   . Hypertension     PAST SURGICAL HISTORY:  Past Surgical History:  Procedure Laterality Date  . cataract surgery Bilateral 2005  . CHOLECYSTECTOMY  1991  . RECONSTRUCTION OF EYELID      HEMATOLOGY/ONCOLOGY HISTORY:  Oncology History   No history exists.    ALLERGIES:  is allergic to hctz [hydrochlorothiazide] and penicillins.  MEDICATIONS:  Current Outpatient Medications  Medication Sig Dispense Refill  . amLODipine (NORVASC) 5 MG tablet TAKE 1 TABLET BY MOUTH EVERY DAY 90 tablet 1  . aspirin 81 MG chewable tablet Chew 81 mg by mouth daily.    [name] atorvastatin (LIPITOR) 40 MG tablet Take 1 tablet (40 mg total) by mouth daily. 90 tablet 1  . COENZYME Q-10 PO Take 200 mg by mouth daily.     . DULoxetine (CYMBALTA) 20 MG capsule TAKE 1 CAPSULE BY MOUTH EVERY DAY 90 capsule 0  . Ibrutinib (IMBRUVICA) 420 MG TABS Take 420 mg by mouth daily. 28 tablet 5  . losartan (COZAAR) 100 MG tablet TAKE 1 TABLET BY MOUTH EVERY DAY 90 tablet 3  . metoprolol succinate (TOPROL-XL) 50 MG 24 hr tablet TAKE 1 TABLET BY MOUTH EVERY DAY 90 tablet 0  . OLANZapine (ZYPREXA) 2.5 MG tablet  Take 1 tablet (2.5 mg total) by mouth at bedtime. 30 tablet 2  . rivastigmine (EXELON) 1.5 MG capsule Take 1.5 mg by mouth 2 (two) times daily.     No current facility-administered medications for this visit.    VITAL SIGNS: There were no vitals taken for this visit. There were no vitals filed for this visit.  Estimated body mass index is 28.26 kg/m as calculated from the following:   Height as of 10/31/19: 5' (1.524 m).   Weight as of 12/13/19: 65.6 kg (144 lb 11.2 oz).  LABS: CBC:    Component Value Date/Time   WBC 35.6 (H) 11/17/2019 1035   HGB 11.9 (L) 11/17/2019 1035   HGB 12.9 09/28/2019 0936   HCT 37.1 11/17/2019 1035   HCT 41.2 09/28/2019 0936   PLT 233 11/17/2019 1035   PLT 262 09/28/2019 0936   MCV 85.3 11/17/2019 1035   MCV 87 09/28/2019 0936   MCV 84 03/19/2014 1000   NEUTROABS 6.4 11/17/2019 1035   NEUTROABS 6.1 03/01/2019 1050   NEUTROABS 4.9 03/19/2014 1000   LYMPHSABS 28.5 (H) 11/17/2019 1035   LYMPHSABS 17.1 (H) 03/01/2019 1050   LYMPHSABS 8.1 (H) 03/19/2014 1000   MONOABS 0.7 11/17/2019 1035   MONOABS 0.6 03/19/2014 1000   EOSABS 0.0 11/17/2019 1035   EOSABS 0.0 03/01/2019 1050   EOSABS 0.1 03/19/2014 1000   BASOSABS 0.0 11/17/2019 1035   BASOSABS 0.0 03/01/2019 1050   BASOSABS 0.2 (H) 03/19/2014 1000   Comprehensive Metabolic Panel:  Component Value Date/Time   NA 139 11/17/2019 1035   NA 141 09/28/2019 0936   K 4.3 11/17/2019 1035   CL 105 11/17/2019 1035   CO2 26 11/17/2019 1035   BUN 22 11/17/2019 1035   BUN 16 09/28/2019 0936   CREATININE 0.92 11/17/2019 1035   CREATININE 0.78 04/20/2017 0914   GLUCOSE 107 (H) 11/17/2019 1035   CALCIUM 8.6 (L) 11/17/2019 1035   AST 24 11/17/2019 1035   AST 25 02/15/2012 1101   ALT 16 11/17/2019 1035   ALT 27 02/15/2012 1101   ALKPHOS 97 11/17/2019 1035   ALKPHOS 89 02/15/2012 1101   BILITOT 0.5 11/17/2019 1035   BILITOT 0.4 09/28/2019 0936   BILITOT 0.4 02/15/2012 1101   PROT 6.5 11/17/2019 1035     PROT 6.2 09/28/2019 0936   PROT 6.9 02/15/2012 1101   ALBUMIN 3.8 11/17/2019 1035   ALBUMIN 4.1 09/28/2019 0936   ALBUMIN 3.7 02/15/2012 1101    RADIOGRAPHIC STUDIES: MR BRAIN WO CONTRAST  Result Date: 12/21/2019 CLINICAL DATA:  Memory loss. EXAM: MRI HEAD WITHOUT CONTRAST TECHNIQUE: Multiplanar, multiecho pulse sequences of the brain and surrounding structures were obtained without intravenous contrast. Additionally, using NeuroQuant software a 3D volumetric analysis of the brain was performed and is compared to a normative database adjusted for age, gender and intracranial volume. COMPARISON:  None. FINDINGS: Brain: No acute infarction, hemorrhage, hydrocephalus, extra-axial collection or mass lesion. Scattered foci of T2 hyperintensity are seen within the white matter of the cerebral hemispheres, nonspecific, likely related to chronic small vessel ischemia. Moderate diffuse parenchymal volume loss. Vascular: Normal flow voids. Skull and upper cervical spine: Increased T1 signal within the visualized upper cervical spine with may represent red marrow reconversion versus marrow replacement this pathology. Sinuses/Orbits: Negative. Other: Multiple bilateral neck lymph nodes NeuroQuant Findings: Volumetric analysis of the brain was performed, with a fully detailed report in [employer]. Briefly, the comparison with age and gender matched reference reveals whole-brain volume within the eighth percentile for age with cortical gray matter and hippocampi within the 7 and 18 percentile, respectively. IMPRESSION: 1. No acute intracranial abnormality. 2. Scattered foci of T2 hyperintensity within the white matter of the cerebral hemispheres, nonspecific, but likely related to chronic small vessel ischemia. 3. NeuroQuant volumetric analysis of the brain, see details on [employer]. 4. Increased T1 signal within the visualized upper cervical spine with may represent red marrow reconversion versus marrow  replacement this pathology. Electronically Signed   By: Pedro Earls M.D.   On: 12/21/2019 14:58     Assessment and Plan-  Reviewed CBC and CMP, plan to continue patient on 420mg  Imbruvica (ibrutinib). Increase in lymphocyte count likely due to initiation of ibrutinib, overall the patient is feeling well. She will follow-up with Dr. [name] next month   Oral Chemotherapy Side Effect/Intolerance:  -Diarrhea: patient reported an incidence of diarrhea where she was unable to make it to the bathroom on last Sunday [place]. When asked if she had previous diarrhea incidents or incidents since then so said "no, none as bad as Sunday". Likely her bowels have been regular and at her normal frequency (twice daily). Instructed the patient to pick up some loperamide so she can use this prn for future diarrhea. She also plans on moving her ibrutinib dose to bed time so she is home if she has diarrhea again.  -Edema: she has noticed some edema in her feet/ankles. She reports elevating her feet  when possible and wearing compression stockings.   Oral Chemotherapy Adherence: no missed doses  Medication Access Issues:  no issues, she reports her son dropped off her ibrutinib recently.  Patient expressed understanding and was in agreement with this plan. She also understands that She can call clinic at any time with any questions, concerns, or complaints.   Thank you for allowing me to participate in the care of this very pleasant patient.   Time Total: 15 mins  Visit consisted of counseling and education on dealing with issues of symptom management in the setting of serious and potentially life-threatening illness.Greater than 50%  of this time was spent counseling and coordinating care related to the above assessment and plan.  Signed by: Darl Pikes, PharmD, BCPS, Salley Slaughter, CPP Hematology/Oncology Clinical Pharmacist Practitioner [facility]/HP/AP [facility] [phone]  01/18/2020 10:42 AM

## 2020-01-19 ENCOUNTER — Other Ambulatory Visit: Payer: Self-pay | Admitting: Family Medicine

## 2020-01-22 DIAGNOSIS — E782 Mixed hyperlipidemia: Secondary | ICD-10-CM | POA: Diagnosis not present

## 2020-01-22 DIAGNOSIS — I10 Essential (primary) hypertension: Secondary | ICD-10-CM | POA: Diagnosis not present

## 2020-01-22 DIAGNOSIS — I700 Atherosclerosis of aorta: Secondary | ICD-10-CM | POA: Diagnosis not present

## 2020-01-22 DIAGNOSIS — R600 Localized edema: Secondary | ICD-10-CM | POA: Diagnosis not present

## 2020-02-02 NOTE — Progress Notes (Signed)
[place]  Telephone:(336) [phone]  Fax:([phone]) [phone]     Barbara Chambers DOB: Aug 25, 1934  MR#: [id number]  LNL#:[id number]  Patient Care Team: [name], MD as PCP - General (Family Medicine) Lloyd Huger, MD as Consulting Physician (Oncology) Corey Skains, MD as Consulting Physician (Cardiology) Brendolyn Patty, MD as Consulting Physician (Dermatology) Birder Robson, MD as Referring Physician (Ophthalmology) Verdia Kuba, [facility] (Pharmacist)   CHIEF COMPLAINT: Progressive CLL.    INTERVAL HISTORY: Patient returns to clinic today for repeat laboratory, further evaluation, and continuation of Ibrance.  She continues to have chronic weakness and fatigue, but otherwise is tolerating her treatment well.  She continues to complain of a poor memory. She has no neurologic complaints. She denies any fevers, night sweats, or weight loss. She denies any chest pain, shortness of breath, cough, or hemoptysis.  She denies any nausea, vomiting, constipation, or diarrhea. She has no urinary complaints.  Patient offers no further specific complaints today.  REVIEW OF SYSTEMS:   Review of Systems  Constitutional: Positive for malaise/fatigue. Negative for diaphoresis, fever and weight loss.  Respiratory: Negative.  Negative for cough and shortness of breath.   Cardiovascular: Negative.  Negative for chest pain and leg swelling.  Gastrointestinal: Negative.  Negative for abdominal pain.  Genitourinary: Negative.  Negative for dysuria.  Musculoskeletal: Negative.  Negative for back pain.  Skin: Negative.  Negative for rash.  Neurological: Positive for weakness. Negative for dizziness, sensory change, focal weakness and headaches.  Psychiatric/Behavioral: Positive for memory loss. Negative for depression. The patient is not nervous/anxious.     As per HPI. Otherwise, a complete review of systems is negative.  PAST MEDICAL HISTORY: Past Medical History:    Diagnosis Date   Chronic lymphocytic leukemia ([place]) 2011   Hyperlipidemia    Hypertension     PAST SURGICAL HISTORY: Past Surgical History:  Procedure Laterality Date   cataract surgery Bilateral 2005   CHOLECYSTECTOMY  1991   RECONSTRUCTION OF EYELID      FAMILY HISTORY Family History  Problem Relation Age of Onset   Hypertension Mother    Stroke Mother    CAD Mother    Hypertension Father    Heart attack Father    Hypertension Brother    Seizures Brother    Hypertension Son    Breast cancer Neg Hx     GYNECOLOGIC HISTORY:  No LMP recorded. Patient is postmenopausal.     ADVANCED DIRECTIVES:    HEALTH MAINTENANCE: Social History   Tobacco Use   Smoking status: Never Smoker   Smokeless tobacco: Never Used  Vaping Use   Vaping Use: Never used  Substance Use Topics   Alcohol use: No   Drug use: No     Colonoscopy:  PAP:  Bone density:  Lipid panel:  Allergies  Allergen Reactions   Hctz [Hydrochlorothiazide]     Hyponatremia   Penicillins Rash    Current Outpatient Medications  Medication Sig Dispense Refill   amLODipine (NORVASC) 5 MG tablet TAKE 1 TABLET BY MOUTH EVERY DAY 90 tablet 1   aspirin 81 MG chewable tablet Chew 81 mg by mouth daily.     atorvastatin (LIPITOR) 40 MG tablet Take 1 tablet (40 mg total) by mouth daily. 90 tablet 1   COENZYME Q-10 PO Take 200 mg by mouth daily.      DULoxetine (CYMBALTA) 20 MG capsule TAKE 1 CAPSULE BY MOUTH EVERY DAY 90 capsule 0   Ibrutinib (IMBRUVICA) 420 MG  TABS Take 420 mg by mouth daily. 28 tablet 5   losartan (COZAAR) 100 MG tablet TAKE 1 TABLET BY MOUTH EVERY DAY 90 tablet 3   metoprolol succinate (TOPROL-XL) 50 MG 24 hr tablet TAKE 1 TABLET BY MOUTH EVERY DAY 90 tablet 0   OLANZapine (ZYPREXA) 2.5 MG tablet Take 1 tablet (2.5 mg total) by mouth at bedtime. 30 tablet 2   rivastigmine (EXELON) 1.5 MG capsule Take 1.5 mg by mouth 2 (two) times daily.     No current  facility-administered medications for this visit.    OBJECTIVE: BP (!) 150/65 (BP Location: Left Arm, Patient Position: Sitting, Cuff Size: Normal)    Pulse 70    Temp 99.1 F (37.3 C) (Tympanic)    Wt 145 lb 1.6 oz (65.8 kg)    SpO2 97%    BMI 28.34 kg/m    Body mass index is 28.34 kg/m.    ECOG FS:0 - Asymptomatic   General: Well-developed, well-nourished, no acute distress. Eyes: Pink conjunctiva, anicteric sclera. HEENT: Normocephalic, moist mucous membranes. Lungs: No audible wheezing or coughing. Heart: Regular rate and rhythm. Abdomen: Soft, nontender, no obvious distention. Musculoskeletal: No edema, cyanosis, or clubbing. Neuro: Alert, answering all questions appropriately. Cranial nerves grossly intact. Skin: No rashes or petechiae noted. Psych: Normal affect.   LAB RESULTS:  Appointment on 02/08/2020  Component Date Value Ref Range Status   Sodium 02/08/2020 140  135 - 145 mmol/L Final   Potassium 02/08/2020 3.9  3.5 - 5.1 mmol/L Final   Chloride 02/08/2020 107  98 - 111 mmol/L Final   CO2 02/08/2020 26  22 - 32 mmol/L Final   Glucose, Bld 02/08/2020 119* 70 - 99 mg/dL Final   Glucose reference range applies only to samples taken after fasting for at least 8 hours.   BUN 02/08/2020 13  8 - 23 mg/dL Final   Creatinine, Ser 02/08/2020 0.80  0.44 - 1.00 mg/dL Final   Calcium 02/08/2020 8.0* 8.9 - 10.3 mg/dL Final   Total Protein 02/08/2020 6.1* 6.5 - 8.1 g/dL Final   Albumin 02/08/2020 3.2* 3.5 - 5.0 g/dL Final   AST 02/08/2020 20  15 - 41 U/L Final   ALT 02/08/2020 11  0 - 44 U/L Final   Alkaline Phosphatase 02/08/2020 82  38 - 126 U/L Final   Total Bilirubin 02/08/2020 0.9  0.3 - 1.2 mg/dL Final   GFR calc non Af Amer 02/08/2020 >60  >60 mL/min Final   GFR calc Af Amer 02/08/2020 >60  >60 mL/min Final   Anion gap 02/08/2020 7  5 - 15 Final   Performed at [facility], [place], [place] [address]   WBC 02/08/2020 33.6* 4.0 -  10.5 K/uL Final   RBC 02/08/2020 3.88  3.87 - 5.11 MIL/uL Final   Hemoglobin 02/08/2020 10.2* 12.0 - 15.0 g/dL Final   HCT 02/08/2020 32.2* 36 - 46 % Final   MCV 02/08/2020 83.0  80.0 - 100.0 fL Final   MCH 02/08/2020 26.3  26.0 - 34.0 pg Final   MCHC 02/08/2020 31.7  30.0 - 36.0 g/dL Final   RDW 02/08/2020 14.1  11.5 - 15.5 % Final   Platelets 02/08/2020 369  150 - 400 K/uL Final   nRBC 02/08/2020 0.0  0.0 - 0.2 % Final   Neutrophils Relative % 02/08/2020 20  % Final   Neutro Abs 02/08/2020 6.8  1.7 - 7.7 K/uL Final   Lymphocytes Relative 02/08/2020 74  % Final  Lymphs Abs 02/08/2020 25.0* 0.7 - 4.0 K/uL Final   Monocytes Relative 02/08/2020 4  % Final   Monocytes Absolute 02/08/2020 1.2* 0 - 1 K/uL Final   Eosinophils Relative 02/08/2020 1  % Final   Eosinophils Absolute 02/08/2020 0.3  0 - 0 K/uL Final   Basophils Relative 02/08/2020 0  % Final   Basophils Absolute 02/08/2020 0.1  0 - 0 K/uL Final   WBC Morphology 02/08/2020 SMUDGE CELLS   Final   CONSISTANT WITH KNOWN CLL.   RBC Morphology 02/08/2020 MORPHOLOGY UNREMARKABLE   Final   Smear Review 02/08/2020 Normal platelet morphology   Final   Comment: PLATELETS APPEAR ADEQUATE Reviewed    Immature Granulocytes 02/08/2020 1  % Final   Abs Immature Granulocytes 02/08/2020 0.24* 0.00 - 0.07 K/uL Final   Performed at [facility], [address] [place], [place] [address]    STUDIES: No results found.  ASSESSMENT: Progressive CLL.  PLAN:    1.  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  Patient's white blood cell count trended up to 45.1 when initiating Imbruvica, but is now trending back down to 33.6.  CT scan results from October 18, 2019 reviewed independently with widespread progressive lymphadenopathy.  Lymph node biopsy confirmed diagnosis.  Patient was initiated Imbruvica 420 mg daily and is tolerating it well.  Continue  treatment until progression of disease or intolerable side effects.  No further intervention is needed.  Return to clinic in 4 weeks for laboratory work only and then in 8 weeks for laboratory work, repeat imaging, and further evaluation. 2.  Anemia: Patient's hemoglobin has decreased to 10.2, monitor.  I spent a total of 30 minutes reviewing chart data, face-to-face evaluation with the patient, counseling and coordination of care as detailed above.   Patient expressed understanding and was in agreement with this plan. She also understands that She can call clinic at any time with any questions, concerns, or complaints.    Lloyd Huger, MD   02/09/2020 6:18 AM

## 2020-02-08 ENCOUNTER — Encounter: Payer: Self-pay | Admitting: Oncology

## 2020-02-08 ENCOUNTER — Inpatient Hospital Stay: Payer: PPO | Attending: Oncology

## 2020-02-08 ENCOUNTER — Other Ambulatory Visit: Payer: Self-pay

## 2020-02-08 ENCOUNTER — Inpatient Hospital Stay (HOSPITAL_BASED_OUTPATIENT_CLINIC_OR_DEPARTMENT_OTHER): Payer: PPO | Admitting: Oncology

## 2020-02-08 VITALS — BP 150/65 | HR 70 | Temp 99.1°F | Wt 145.1 lb

## 2020-02-08 DIAGNOSIS — C9110 Chronic lymphocytic leukemia of B-cell type not having achieved remission: Secondary | ICD-10-CM

## 2020-02-08 DIAGNOSIS — R531 Weakness: Secondary | ICD-10-CM | POA: Insufficient documentation

## 2020-02-08 DIAGNOSIS — D649 Anemia, unspecified: Secondary | ICD-10-CM | POA: Insufficient documentation

## 2020-02-08 DIAGNOSIS — R5383 Other fatigue: Secondary | ICD-10-CM | POA: Diagnosis not present

## 2020-02-08 DIAGNOSIS — Z79899 Other long term (current) drug therapy: Secondary | ICD-10-CM | POA: Insufficient documentation

## 2020-02-08 LAB — CBC WITH DIFFERENTIAL/PLATELET
Abs Immature Granulocytes: 0.24 10*3/uL — ABNORMAL HIGH (ref 0.00–0.07)
Basophils Absolute: 0.1 10*3/uL (ref 0.0–0.1)
Basophils Relative: 0 %
Eosinophils Absolute: 0.3 10*3/uL (ref 0.0–0.5)
Eosinophils Relative: 1 %
HCT: 32.2 % — ABNORMAL LOW (ref 36.0–46.0)
Hemoglobin: 10.2 g/dL — ABNORMAL LOW (ref 12.0–15.0)
Immature Granulocytes: 1 %
Lymphocytes Relative: 74 %
Lymphs Abs: 25 10*3/uL — ABNORMAL HIGH (ref 0.7–4.0)
MCH: 26.3 pg (ref 26.0–34.0)
MCHC: 31.7 g/dL (ref 30.0–36.0)
MCV: 83 fL (ref 80.0–100.0)
Monocytes Absolute: 1.2 10*3/uL — ABNORMAL HIGH (ref 0.1–1.0)
Monocytes Relative: 4 %
Neutro Abs: 6.8 10*3/uL (ref 1.7–7.7)
Neutrophils Relative %: 20 %
Platelets: 369 10*3/uL (ref 150–400)
RBC: 3.88 MIL/uL (ref 3.87–5.11)
RDW: 14.1 % (ref 11.5–15.5)
Smear Review: NORMAL
WBC: 33.6 10*3/uL — ABNORMAL HIGH (ref 4.0–10.5)
nRBC: 0 % (ref 0.0–0.2)

## 2020-02-08 LAB — COMPREHENSIVE METABOLIC PANEL
ALT: 11 U/L (ref 0–44)
AST: 20 U/L (ref 15–41)
Albumin: 3.2 g/dL — ABNORMAL LOW (ref 3.5–5.0)
Alkaline Phosphatase: 82 U/L (ref 38–126)
Anion gap: 7 (ref 5–15)
BUN: 13 mg/dL (ref 8–23)
CO2: 26 mmol/L (ref 22–32)
Calcium: 8 mg/dL — ABNORMAL LOW (ref 8.9–10.3)
Chloride: 107 mmol/L (ref 98–111)
Creatinine, Ser: 0.8 mg/dL (ref 0.44–1.00)
GFR calc Af Amer: >60 mL/min (ref >60)
GFR calc non Af Amer: >60 mL/min (ref >60)
Glucose, Bld: 119 mg/dL — ABNORMAL HIGH (ref 70–99)
Potassium: 3.9 mmol/L (ref 3.5–5.1)
Sodium: 140 mmol/L (ref 135–145)
Total Bilirubin: 0.9 mg/dL (ref 0.3–1.2)
Total Protein: 6.1 g/dL — ABNORMAL LOW (ref 6.5–8.1)

## 2020-02-08 MED FILL — IMBRUVICA 420 MG TAB: 420 | 28 days supply | Qty: 28 | Fill #3

## 2020-02-08 NOTE — Progress Notes (Signed)
Patient here for follow up. Denies any pain. States she has been having some problems with swelling in legs and some elevated bps. Recently seen cardiologist concerns elevated bp. Voiced no other concerns at this time.

## 2020-02-28 NOTE — Progress Notes (Signed)
I,Laura E Walsh,acting as a scribe for Lavon Paganini, MD.,have documented all relevant documentation on the behalf of Lavon Paganini, MD,as directed by  Lavon Paganini, MD while in the presence of Lavon Paganini, MD.  Established patient visit   Patient: Barbara Chambers   DOB: [birth year]   85 y.o. Female  MRN: [id number] Visit Date: 02/29/2020  Today's healthcare provider: Lavon Paganini, MD   Chief Complaint  Patient presents with  . Hypertension   Subjective    HPI  Hypertension, follow-up  BP Readings from Last 3 Encounters:  02/29/20 (!) 144/66  02/08/20 (!) 150/65  01/18/20 (!) 168/71   Wt Readings from Last 3 Encounters:  02/29/20 141 lb (64 kg)  02/08/20 145 lb 1.6 oz (65.8 kg)  01/18/20 140 lb 14.4 oz (63.9 kg)     She was last seen for hypertension 5 months ago.  BP at that visit was 124/65. Management since that visit includes no changes.  She reports excellent compliance with treatment. She is not having side effects.  She is following a Low Sodium diet. She is exercising. She does not smoke.  Use of agents associated with hypertension: none.   Outside blood pressures are 130-160's/60-80's. Symptoms: No chest pain No chest pressure  No palpitations No syncope  No dyspnea No orthopnea  No paroxysmal nocturnal dyspnea Yes lower extremity edema   Pertinent labs: Lab Results  Component Value Date   CHOL 179 09/28/2019   HDL 50 09/28/2019   LDLCALC 108 (H) 09/28/2019   TRIG 119 09/28/2019   CHOLHDL 3.6 09/28/2019   Lab Results  Component Value Date   NA 140 02/08/2020   K 3.9 02/08/2020   CREATININE 0.80 02/08/2020   GFRNONAA >60 02/08/2020   GFRAA >60 02/08/2020   GLUCOSE 119 (H) 02/08/2020     The ASCVD Risk score (Goff DC Jr., et al., 2013) failed to calculate for the following reasons:   The 2013 ASCVD risk score is only valid for ages 59 to 11    ---------------------------------------------------------------------------------------------------   Patient Active Problem List   Diagnosis Date Noted  . Bilateral leg edema 11/09/2019  . Atherosclerosis of abdominal aorta ([place]) 11/09/2019  . Senile purpura ([place]) 09/28/2019  . Cervical adenopathy 09/28/2019  . Memory loss 09/28/2019  . Actinic keratosis 09/01/2019  . Rosacea 09/01/2019  . UTI (urinary tract infection) 03/03/2019  . Hyponatremia 03/03/2019  . Adjustment disorder with mixed anxiety and depressed mood 03/01/2019  . Obsessional thoughts 03/01/2019  . Delusional disorder ([place]) 03/01/2019  . Shortness of breath 07/11/2018  . Vulvar irritation 07/11/2018  . Dizziness 05/18/2017  . Leg cramps 04/20/2017  . Arthritis 03/08/2015  . Anxiety 10/25/2014  . CLL (chronic lymphocytic leukemia) ([place]) 10/25/2014  . HLD (hyperlipidemia) 10/25/2014  . Subclinical hypothyroidism 10/25/2014  . Abnormal ECG 10/11/2014  . Benign essential HTN 10/11/2014   Past Medical History:  Diagnosis Date  . Chronic lymphocytic leukemia ([place]) 2011  . Hyperlipidemia   . Hypertension    Social History   Tobacco Use  . Smoking status: Never Smoker  . Smokeless tobacco: Never Used  Vaping Use  . Vaping Use: Never used  Substance Use Topics  . Alcohol use: No  . Drug use: No   Allergies  Allergen Reactions  . Hctz [Hydrochlorothiazide]     Hyponatremia  . Penicillins Rash     Medications: Outpatient Medications Prior to Visit  Medication Sig  . aspirin 81 MG chewable tablet Chew 81 mg by mouth  daily.  . atorvastatin (LIPITOR) 40 MG tablet Take 1 tablet (40 mg total) by mouth daily.  [name] COENZYME Q-10 PO Take 200 mg by mouth daily.   . DULoxetine (CYMBALTA) 20 MG capsule TAKE 1 CAPSULE BY MOUTH EVERY DAY  . Ibrutinib (IMBRUVICA) 420 MG TABS Take 420 mg by mouth daily.  [name] losartan (COZAAR) 100 MG tablet TAKE 1 TABLET BY MOUTH EVERY DAY  . metoprolol succinate (TOPROL-XL) 50 MG  24 hr tablet TAKE 1 TABLET BY MOUTH EVERY DAY  . rivastigmine (EXELON) 1.5 MG capsule Take 1.5 mg by mouth 2 (two) times daily.  . vitamin B-12 (CYANOCOBALAMIN) 1000 MCG tablet Take 1,000 mcg by mouth daily.  . [DISCONTINUED] OLANZapine (ZYPREXA) 2.5 MG tablet Take 1 tablet (2.5 mg total) by mouth at bedtime.  . [DISCONTINUED] amLODipine (NORVASC) 5 MG tablet TAKE 1 TABLET BY MOUTH EVERY DAY (Patient not taking: Reported on 02/29/2020)   No facility-administered medications prior to visit.    Review of Systems  Constitutional: Positive for appetite change. Negative for activity change, chills, diaphoresis, fatigue, fever and unexpected weight change.  Respiratory: Negative.   Cardiovascular: Positive for leg swelling. Negative for chest pain and palpitations.  Gastrointestinal: Positive for diarrhea (Secondary to Imbruvica 420mg  ). Negative for abdominal distention, abdominal pain, anal bleeding, blood in stool, constipation, nausea, rectal pain and vomiting.  Neurological: Positive for headaches (Only occasionally). Negative for dizziness and light-headedness.      Objective    BP (!) 144/66 (BP Location: Left Arm, Patient Position: Sitting, Cuff Size: Large)   Pulse 80   Temp 98.1 F (36.7 C) (Oral)   Wt 141 lb (64 kg)   SpO2 96%   BMI 27.54 kg/m    Physical Exam Constitutional:      Appearance: Normal appearance.  Cardiovascular:     Rate and Rhythm: Normal rate and regular rhythm.     Pulses: Normal pulses.     Heart sounds: Normal heart sounds.  Pulmonary:     Effort: Pulmonary effort is normal.     Breath sounds: Normal breath sounds.  Abdominal:     Palpations: Abdomen is soft.     Tenderness: There is no abdominal tenderness.  Musculoskeletal:     Right lower leg: Edema present.     Left lower leg: Edema present.     Comments: Pt is wearing compression socks today.    Skin:    General: Skin is warm and dry.  Neurological:     Mental Status: She is alert and  oriented to person, place, and time. Mental status is at baseline.  Psychiatric:        Mood and Affect: Mood normal.        Behavior: Behavior normal.        Thought Content: Thought content normal.        Judgment: Judgment normal.       No results found for any visits on 02/29/20.  Assessment & Plan     Problem List Items Addressed This Visit      Cardiovascular and Mediastinum   Benign essential HTN - Primary    Slightly elevated today Concern for inconsistency in compliance at home with medications, given up and down nature of home BPs Continue losartan and toprol at current doses Reviewed recent metabolic panel Follow-up in 6 months or sooner as needed Blood pressure will also be monitored at upcoming visits with other specialists      Senile purpura ([place])  Reassurance given Continue to monitor      Atherosclerosis of abdominal aorta ([facility])    Doing well on statin - continue        Other   Delusional disorder ([place])    Stable Followed by neurology and psychiatry Continue Zyprexa      Bilateral leg edema    Ongoing and uncontrolled Stopped amlodipine, which has not helped her swelling Continue compression stockings and encourage elevation Concern for this possibly being related to Imbruvica side effect as it started around the time that she started Imbruvica and the incidence of edema is sided at 12 to 35% for Imbruvica We will reach out to her oncologist regarding this possibility She did not tolerate thiazide diuretics due to hyponatremia, so we will avoid those She otherwise seems to be euvolemic, but could consider echo in the future       Other Visit Diagnoses    Parasitosis       Relevant Medications   OLANZapine (ZYPREXA) 2.5 MG tablet       Return in about 6 months (around 08/28/2020) for AWV and Physical.      I, Lavon Paganini, MD, have reviewed all documentation for this visit. The documentation on 02/29/20 for the exam, diagnosis,  procedures, and orders are all accurate and complete.   Danielle Mink, [name], MD, MPH [facility]

## 2020-02-29 ENCOUNTER — Ambulatory Visit (INDEPENDENT_AMBULATORY_CARE_PROVIDER_SITE_OTHER): Payer: PPO | Admitting: Family Medicine

## 2020-02-29 ENCOUNTER — Other Ambulatory Visit: Payer: Self-pay

## 2020-02-29 ENCOUNTER — Encounter: Payer: Self-pay | Admitting: Family Medicine

## 2020-02-29 VITALS — BP 144/66 | HR 80 | Temp 98.1°F | Wt 141.0 lb

## 2020-02-29 DIAGNOSIS — B89 Unspecified parasitic disease: Secondary | ICD-10-CM | POA: Diagnosis not present

## 2020-02-29 DIAGNOSIS — F22 Delusional disorders: Secondary | ICD-10-CM

## 2020-02-29 DIAGNOSIS — I10 Essential (primary) hypertension: Secondary | ICD-10-CM | POA: Diagnosis not present

## 2020-02-29 DIAGNOSIS — D692 Other nonthrombocytopenic purpura: Secondary | ICD-10-CM

## 2020-02-29 DIAGNOSIS — R600 Localized edema: Secondary | ICD-10-CM | POA: Diagnosis not present

## 2020-02-29 DIAGNOSIS — I700 Atherosclerosis of aorta: Secondary | ICD-10-CM

## 2020-02-29 MED ORDER — OLANZAPINE 2.5 MG PO TABS: 2.5000 mg | ORAL_TABLET | Freq: Every day | ORAL | 2 refills | Status: DC

## 2020-02-29 NOTE — Assessment & Plan Note (Signed)
Reassurance given Continue to monitor

## 2020-02-29 NOTE — Patient Instructions (Signed)

## 2020-02-29 NOTE — Assessment & Plan Note (Signed)
Stable Followed by neurology and psychiatry Continue Zyprexa

## 2020-02-29 NOTE — Assessment & Plan Note (Addendum)
Ongoing and uncontrolled Stopped amlodipine, which has not helped her swelling Continue compression stockings and encourage elevation Concern for this possibly being related to Imbruvica side effect as it started around the time that she started Imbruvica and the incidence of edema is sided at 12 to 35% for Imbruvica We will reach out to her oncologist regarding this possibility She did not tolerate thiazide diuretics due to hyponatremia, so we will avoid those She otherwise seems to be euvolemic, but could consider echo in the future

## 2020-02-29 NOTE — Assessment & Plan Note (Signed)
Doing well on statin - continue

## 2020-02-29 NOTE — Assessment & Plan Note (Signed)
Slightly elevated today Concern for inconsistency in compliance at home with medications, given up and down nature of home BPs Continue losartan and toprol at current doses Reviewed recent metabolic panel Follow-up in 6 months or sooner as needed Blood pressure will also be monitored at upcoming visits with other specialists

## 2020-03-06 MED FILL — IMBRUVICA 420 MG TAB: 420 | 28 days supply | Qty: 28 | Fill #4

## 2020-03-07 ENCOUNTER — Ambulatory Visit (INDEPENDENT_AMBULATORY_CARE_PROVIDER_SITE_OTHER): Payer: Medicare Other

## 2020-03-07 ENCOUNTER — Other Ambulatory Visit: Payer: Self-pay

## 2020-03-07 ENCOUNTER — Ambulatory Visit (INDEPENDENT_AMBULATORY_CARE_PROVIDER_SITE_OTHER): Payer: PPO

## 2020-03-07 ENCOUNTER — Inpatient Hospital Stay: Payer: PPO | Attending: Oncology

## 2020-03-07 DIAGNOSIS — Z23 Encounter for immunization: Secondary | ICD-10-CM | POA: Diagnosis not present

## 2020-03-07 DIAGNOSIS — C9110 Chronic lymphocytic leukemia of B-cell type not having achieved remission: Secondary | ICD-10-CM | POA: Insufficient documentation

## 2020-03-07 LAB — COMPREHENSIVE METABOLIC PANEL
ALT: 15 U/L (ref 0–44)
AST: 26 U/L (ref 15–41)
Albumin: 3.8 g/dL (ref 3.5–5.0)
Alkaline Phosphatase: 78 U/L (ref 38–126)
Anion gap: 11 (ref 5–15)
BUN: 16 mg/dL (ref 8–23)
CO2: 26 mmol/L (ref 22–32)
Calcium: 8.6 mg/dL — ABNORMAL LOW (ref 8.9–10.3)
Chloride: 104 mmol/L (ref 98–111)
Creatinine, Ser: 0.82 mg/dL (ref 0.44–1.00)
GFR calc Af Amer: >60 mL/min (ref >60)
GFR calc non Af Amer: >60 mL/min (ref >60)
Glucose, Bld: 102 mg/dL — ABNORMAL HIGH (ref 70–99)
Potassium: 3.8 mmol/L (ref 3.5–5.1)
Sodium: 141 mmol/L (ref 135–145)
Total Bilirubin: 1 mg/dL (ref 0.3–1.2)
Total Protein: 6.7 g/dL (ref 6.5–8.1)

## 2020-03-07 LAB — CBC WITH DIFFERENTIAL/PLATELET
Abs Immature Granulocytes: 0.12 10*3/uL — ABNORMAL HIGH (ref 0.00–0.07)
Basophils Absolute: 0.1 10*3/uL (ref 0.0–0.1)
Basophils Relative: 0 %
Eosinophils Absolute: 0.2 10*3/uL (ref 0.0–0.5)
Eosinophils Relative: 1 %
HCT: 37.7 % (ref 36.0–46.0)
Hemoglobin: 11.6 g/dL — ABNORMAL LOW (ref 12.0–15.0)
Immature Granulocytes: 0 %
Lymphocytes Relative: 78 %
Lymphs Abs: 23.3 10*3/uL — ABNORMAL HIGH (ref 0.7–4.0)
MCH: 25.6 pg — ABNORMAL LOW (ref 26.0–34.0)
MCHC: 30.8 g/dL (ref 30.0–36.0)
MCV: 83 fL (ref 80.0–100.0)
Monocytes Absolute: 1.1 10*3/uL — ABNORMAL HIGH (ref 0.1–1.0)
Monocytes Relative: 4 %
Neutro Abs: 5 10*3/uL (ref 1.7–7.7)
Neutrophils Relative %: 17 %
Platelets: 263 10*3/uL (ref 150–400)
RBC: 4.54 MIL/uL (ref 3.87–5.11)
RDW: 14.1 % (ref 11.5–15.5)
Smear Review: ADEQUATE
WBC Morphology: ABNORMAL
WBC: 29.8 10*3/uL — ABNORMAL HIGH (ref 4.0–10.5)
nRBC: 0 % (ref 0.0–0.2)

## 2020-04-03 MED FILL — IMBRUVICA 420 MG TAB: 420 | 28 days supply | Qty: 28 | Fill #5

## 2020-04-04 NOTE — Progress Notes (Signed)
[place]  Telephone:([phone]) [phone]  [phone]     Barbara Chambers DOB: 06-29-1935  MR#: [id number]  AOZ#:[id number]  Patient Care Team: [name], MD as PCP - General (Family Medicine) Lloyd Huger, MD as Consulting Physician (Oncology) Corey Skains, MD as Consulting Physician (Cardiology) Brendolyn Patty, MD as Consulting Physician (Dermatology) Birder Robson, MD as Referring Physician (Ophthalmology) Verdia Kuba, [facility] (Pharmacist)  I connected with Barbara Chambers on 04/09/20 at  1:45 PM EDT by video enabled telemedicine visit and verified that I am speaking with the correct person using two identifiers.   I discussed the limitations, risks, security and privacy concerns of performing an evaluation and management service by telemedicine and the availability of in-person appointments. I also discussed with the patient that there may be a patient responsible charge related to this service. The patient expressed understanding and agreed to proceed.   Other persons participating in the visit and their role in the encounter: Patient, MD.  Patient's location: Home. Provider's location: Clinic.  CHIEF COMPLAINT: Progressive CLL.    INTERVAL HISTORY: Patient agreed to video assisted telemedicine visit for further evaluation and discussion of her imaging results.  She continues to tolerate Imbruvica well without significant side effects.  She does not complain of weakness or fatigue today.  She continues to have a poor memory. She has no neurologic complaints. She denies any fevers, night sweats, or weight loss. She denies any chest pain, shortness of breath, cough, or hemoptysis.  She denies any nausea, vomiting, constipation, or diarrhea. She has no urinary complaints.  Patient offers no specific complaints today.  REVIEW OF SYSTEMS:   Review of Systems  Constitutional: Negative.  Negative for diaphoresis, fever, malaise/fatigue and weight  loss.  Respiratory: Negative.  Negative for cough and shortness of breath.   Cardiovascular: Negative.  Negative for chest pain and leg swelling.  Gastrointestinal: Negative.  Negative for abdominal pain.  Genitourinary: Negative.  Negative for dysuria.  Musculoskeletal: Negative.  Negative for back pain.  Skin: Negative.  Negative for rash.  Neurological: Negative.  Negative for dizziness, sensory change, focal weakness, weakness and headaches.  Psychiatric/Behavioral: Positive for memory loss. Negative for depression. The patient is not nervous/anxious.     As per HPI. Otherwise, a complete review of systems is negative.  PAST MEDICAL HISTORY: Past Medical History:  Diagnosis Date  . Chronic lymphocytic leukemia ([place]) 2011  . Hyperlipidemia   . Hypertension     PAST SURGICAL HISTORY: Past Surgical History:  Procedure Laterality Date  . cataract surgery Bilateral 2005  . CHOLECYSTECTOMY  1991  . RECONSTRUCTION OF EYELID      FAMILY HISTORY Family History  Problem Relation Age of Onset  . Hypertension Mother   . Stroke Mother   . CAD Mother   . Hypertension Father   . Heart attack Father   . Hypertension Brother   . Seizures Brother   . Hypertension Son   . Breast cancer Neg Hx     GYNECOLOGIC HISTORY:  No LMP recorded. Patient is postmenopausal.     ADVANCED DIRECTIVES:    HEALTH MAINTENANCE: Social History   Tobacco Use  . Smoking status: Never Smoker  . Smokeless tobacco: Never Used  Vaping Use  . Vaping Use: Never used  Substance Use Topics  . Alcohol use: No  . Drug use: No     Colonoscopy:  PAP:  Bone density:  Lipid panel:  Allergies  Allergen Reactions  .  Hctz [Hydrochlorothiazide]     Hyponatremia  . Penicillins Rash    Current Outpatient Medications  Medication Sig Dispense Refill  . amLODipine (NORVASC) 5 MG tablet Take 5 mg by mouth daily.    [name] aspirin 81 MG chewable tablet Chew 81 mg by mouth daily.    [name] atorvastatin (LIPITOR)  40 MG tablet Take 1 tablet (40 mg total) by mouth daily. 90 tablet 1  . COENZYME Q-10 PO Take 200 mg by mouth daily.     . DULoxetine (CYMBALTA) 20 MG capsule TAKE 1 CAPSULE BY MOUTH EVERY DAY 90 capsule 0  . Ibrutinib (IMBRUVICA) 420 MG TABS Take 420 mg by mouth daily. 28 tablet 5  . losartan (COZAAR) 100 MG tablet TAKE 1 TABLET BY MOUTH EVERY DAY 90 tablet 3  . metoprolol succinate (TOPROL-XL) 50 MG 24 hr tablet TAKE 1 TABLET BY MOUTH EVERY DAY 90 tablet 0  . OLANZapine (ZYPREXA) 2.5 MG tablet Take 1 tablet (2.5 mg total) by mouth at bedtime. 30 tablet 2  . rivastigmine (EXELON) 1.5 MG capsule Take 1.5 mg by mouth 2 (two) times daily.    . vitamin B-12 (CYANOCOBALAMIN) 1000 MCG tablet Take 1,000 mcg by mouth daily.     No current facility-administered medications for this visit.    OBJECTIVE: There were no vitals taken for this visit.   There is no height or weight on file to calculate BMI.    ECOG FS:0 - Asymptomatic   General: Well-developed, well-nourished, no acute distress. HEENT: Normocephalic. Neuro: Alert, answering all questions appropriately. Cranial nerves grossly intact. Psych: Normal affect.  LAB RESULTS:  No visits with results within 3 Day(s) from this visit.  Latest known visit with results is:  Appointment on 03/07/2020  Component Date Value Ref Range Status  . Sodium 03/07/2020 141  135 - 145 mmol/L Final  . Potassium 03/07/2020 3.8  3.5 - 5.1 mmol/L Final  . Chloride 03/07/2020 104  98 - 111 mmol/L Final  . CO2 03/07/2020 26  22 - 32 mmol/L Final  . Glucose, Bld 03/07/2020 102* 70 - 99 mg/dL Final   Glucose reference range applies only to samples taken after fasting for at least 8 hours.  . BUN 03/07/2020 16  8 - 23 mg/dL Final  . Creatinine, Ser 03/07/2020 0.82  0.44 - 1.00 mg/dL Final  . Calcium 03/07/2020 8.6* 8.9 - 10.3 mg/dL Final  . Total Protein 03/07/2020 6.7  6.5 - 8.1 g/dL Final  . Albumin 03/07/2020 3.8  3.5 - 5.0 g/dL Final  . AST 03/07/2020 26   15 - 41 U/L Final  . ALT 03/07/2020 15  0 - 44 U/L Final  . Alkaline Phosphatase 03/07/2020 78  38 - 126 U/L Final  . Total Bilirubin 03/07/2020 1.0  0.3 - 1.2 mg/dL Final  . GFR calc non Af Amer 03/07/2020 >60  >60 mL/min Final  . GFR calc Af Amer 03/07/2020 >60  >60 mL/min Final  . Anion gap 03/07/2020 11  5 - 15 Final   Performed at [facility], [address] [place], [place] [address]  . WBC 03/07/2020 29.8* 4.0 - 10.5 K/uL Final  . RBC 03/07/2020 4.54  3.87 - 5.11 MIL/uL Final  . Hemoglobin 03/07/2020 11.6* 12.0 - 15.0 g/dL Final  . HCT 03/07/2020 37.7  36 - 46 % Final  . MCV 03/07/2020 83.0  80.0 - 100.0 fL Final  . MCH 03/07/2020 25.6* 26.0 - 34.0 pg Final  . MCHC 03/07/2020 30.8  30.0 - 36.0 g/dL Final  . RDW 03/07/2020 14.1  11.5 - 15.5 % Final  . Platelets 03/07/2020 263  150 - 400 K/uL Final  . nRBC 03/07/2020 0.0  0.0 - 0.2 % Final  . Neutrophils Relative % 03/07/2020 17  % Final  . Neutro Abs 03/07/2020 5.0  1.7 - 7.7 K/uL Final  . Lymphocytes Relative 03/07/2020 78  % Final  . Lymphs Abs 03/07/2020 23.3* 0.7 - 4.0 K/uL Final  . Monocytes Relative 03/07/2020 4  % Final  . Monocytes Absolute 03/07/2020 1.1* 0.1 - 1.0 K/uL Final  . Eosinophils Relative 03/07/2020 1  % Final  . Eosinophils Absolute 03/07/2020 0.2  0.0 - 0.5 K/uL Final  . Basophils Relative 03/07/2020 0  % Final  . Basophils Absolute 03/07/2020 0.1  0.0 - 0.1 K/uL Final  . WBC Morphology 03/07/2020 Abnormal lymphocytes present   Final   DIFF CONFIRMED BY MANUAL. CONSISTANT WITH KNOWN CLL.  . RBC Morphology 03/07/2020 UNREMARKABLE   Final  . Smear Review 03/07/2020 PLATELETS APPEAR ADEQUATE   Final   PLATELETS VARY IN SIZE WITH FEW LARGE BODY PLATELETS PRESENT.  . Immature Granulocytes 03/07/2020 0  % Final  . Abs Immature Granulocytes 03/07/2020 0.12* 0.00 - 0.07 K/uL Final   Performed at [facility], [address] [place], [place] [address]    STUDIES: No results  found.  ASSESSMENT: Progressive CLL.  PLAN:    1.  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  CT scan results from April 05, 2020 revealed substantial improvement of patient's widespread lymphadenopathy.  Her most recent white blood cell count from March 07, 2020 also continues to trend down to 29.8.  Continue Imbruvica 420 mg daily. Continue treatment until progression of disease or intolerable side effects.  No further intervention is needed.  Return to clinic in 6 weeks with repeat laboratory work and further evaluation. 2.  Anemia: Patient's most recent hemoglobin has improved to 11.6.  Monitor.  I provided 20 minutes of face-to-face video visit time during this encounter which included chart review, counseling, and coordination of care as documented above.   Patient expressed understanding and was in agreement with this plan. She also understands that She can call clinic at any time with any questions, concerns, or complaints.    Lloyd Huger, MD   04/09/2020 2:01 PM

## 2020-04-05 ENCOUNTER — Ambulatory Visit
Admission: RE | Admit: 2020-04-05 | Discharge: 2020-04-05 | Disposition: A | Discharge status: Home / Self Care | Payer: PPO | Source: Ambulatory Visit | Attending: Oncology | Admitting: Oncology

## 2020-04-05 ENCOUNTER — Other Ambulatory Visit: Payer: Self-pay

## 2020-04-05 DIAGNOSIS — C9110 Chronic lymphocytic leukemia of B-cell type not having achieved remission: Secondary | ICD-10-CM | POA: Insufficient documentation

## 2020-04-05 MED ORDER — IOHEXOL 300 MG/ML  SOLN
85.0000 mL | Freq: Once | INTRAMUSCULAR | Status: AC | PRN
  Administered 2020-04-05: 85 mL via INTRAVENOUS

## 2020-04-08 ENCOUNTER — Ambulatory Visit: Payer: PPO | Admitting: Oncology

## 2020-04-09 ENCOUNTER — Inpatient Hospital Stay: Payer: PPO | Attending: Oncology | Admitting: Oncology

## 2020-04-09 ENCOUNTER — Other Ambulatory Visit: Payer: Self-pay | Admitting: Oncology

## 2020-04-09 ENCOUNTER — Encounter: Payer: Self-pay | Admitting: Oncology

## 2020-04-09 DIAGNOSIS — C9110 Chronic lymphocytic leukemia of B-cell type not having achieved remission: Secondary | ICD-10-CM

## 2020-04-14 ENCOUNTER — Other Ambulatory Visit: Payer: Self-pay | Admitting: Family Medicine

## 2020-04-14 DIAGNOSIS — I10 Essential (primary) hypertension: Secondary | ICD-10-CM

## 2020-04-14 NOTE — Telephone Encounter (Signed)
Requested Prescriptions  Pending Prescriptions Disp Refills  . metoprolol succinate (TOPROL-XL) 50 MG 24 hr tablet [Pharmacy Med Name: METOPROLOL SUCC ER 50 MG TAB] 90 tablet 0    Sig: TAKE 1 TABLET BY MOUTH EVERY DAY     Cardiovascular:  Beta Blockers Failed - 04/14/2020 12:43 AM      Failed - Last BP in normal range    BP Readings from Last 1 Encounters:  02/29/20 (!) 144/66         Passed - Last Heart Rate in normal range    Pulse Readings from Last 1 Encounters:  02/29/20 80         Passed - Valid encounter within last 6 months    Recent Outpatient Visits          1 month ago Benign essential HTN   [facility], [name], MD   6 months ago Encounter for annual physical exam   [facility], [name], MD   7 months ago Rosacea   [facility], [name], MD   9 months ago Dry skin dermatitis   [facility] [place], [name], MD   11 months ago Benign essential HTN   [facility] [place], [name], MD

## 2020-04-15 DIAGNOSIS — R413 Other amnesia: Secondary | ICD-10-CM | POA: Diagnosis not present

## 2020-04-22 DIAGNOSIS — H524 Presbyopia: Secondary | ICD-10-CM | POA: Diagnosis not present

## 2020-04-23 ENCOUNTER — Other Ambulatory Visit: Payer: Self-pay | Admitting: Oncology

## 2020-04-24 ENCOUNTER — Other Ambulatory Visit (HOSPITAL_COMMUNITY): Payer: Self-pay | Admitting: Oncology

## 2020-04-24 ENCOUNTER — Other Ambulatory Visit: Payer: Self-pay | Admitting: Oncology

## 2020-04-24 DIAGNOSIS — C9110 Chronic lymphocytic leukemia of B-cell type not having achieved remission: Secondary | ICD-10-CM

## 2020-05-01 MED FILL — IMBRUVICA 420 MG TAB: 420 | 28 days supply | Qty: 28 | Fill #0

## 2020-05-18 NOTE — Progress Notes (Signed)
**Note Barbara-Identified via Obfuscation** Barbara Chambers  [phone]  [phone]     Barbara Chambers DOB: 1935/04/15  MR#: [id number]  OFH#:[id number]  Patient Care Team: [name], MD as PCP - General (Family Medicine) Lloyd Huger, MD as Consulting Physician (Oncology) Corey Skains, MD as Consulting Physician (Cardiology) Brendolyn Patty, MD as Consulting Physician (Dermatology) Birder Robson, MD as Referring Physician (Ophthalmology) Verdia Kuba, [facility] (Inactive) (Pharmacist)   CHIEF COMPLAINT: CLL.  INTERVAL HISTORY: Patient returns to clinic today for repeat laboratory work and further evaluation.  She continues to tolerate Imbruvica well without significant side effects.  She denies any weakness or fatigue.  She continues to complain of a poor memory.  She has no neurologic complaints. She denies any fevers, night sweats, or weight loss. She denies any chest pain, shortness of breath, cough, or hemoptysis.  She denies any nausea, vomiting, constipation, or diarrhea. She has no urinary complaints.  Patient offers no further specific complaints today.  REVIEW OF SYSTEMS:   Review of Systems  Constitutional: Negative.  Negative for diaphoresis, fever, malaise/fatigue and weight loss.  Respiratory: Negative.  Negative for cough and shortness of breath.   Cardiovascular: Negative.  Negative for chest pain and leg swelling.  Gastrointestinal: Negative.  Negative for abdominal pain.  Genitourinary: Negative.  Negative for dysuria.  Musculoskeletal: Negative.  Negative for back pain.  Skin: Negative.  Negative for rash.  Neurological: Negative.  Negative for dizziness, sensory change, focal weakness, weakness and headaches.  Psychiatric/Behavioral: Positive for memory loss. Negative for depression. The patient is not nervous/anxious.     As per HPI. Otherwise, a complete review of systems is negative.  PAST MEDICAL HISTORY: Past Medical History:  Diagnosis Date  .  Chronic lymphocytic leukemia ([place]) 2011  . Hyperlipidemia   . Hypertension     PAST SURGICAL HISTORY: Past Surgical History:  Procedure Laterality Date  . cataract surgery Bilateral 2005  . CHOLECYSTECTOMY  1991  . RECONSTRUCTION OF EYELID      FAMILY HISTORY Family History  Problem Relation Age of Onset  . Hypertension Mother   . Stroke Mother   . CAD Mother   . Hypertension Father   . Heart attack Father   . Hypertension Brother   . Seizures Brother   . Hypertension Son   . Breast cancer Neg Hx     GYNECOLOGIC HISTORY:  No LMP recorded. Patient is postmenopausal.     ADVANCED DIRECTIVES:    HEALTH MAINTENANCE: Social History   Tobacco Use  . Smoking status: Never Smoker  . Smokeless tobacco: Never Used  Vaping Use  . Vaping Use: Never used  Substance Use Topics  . Alcohol use: No  . Drug use: No     Colonoscopy:  PAP:  Bone density:  Lipid panel:  Allergies  Allergen Reactions  . Hctz [Hydrochlorothiazide]     Hyponatremia  . Penicillins Rash    Current Outpatient Medications  Medication Sig Dispense Refill  . amLODipine (NORVASC) 5 MG tablet Take 5 mg by mouth daily.    [name] aspirin 81 MG chewable tablet Chew 81 mg by mouth daily.    [name] atorvastatin (LIPITOR) 40 MG tablet Take 1 tablet (40 mg total) by mouth daily. 90 tablet 1  . COENZYME Q-10 PO Take 200 mg by mouth daily.     . DULoxetine (CYMBALTA) 20 MG capsule TAKE 1 CAPSULE BY MOUTH EVERY DAY 90 capsule 0  . IMBRUVICA 420 MG TABS TAKE 1 TABLET BY  MOUTH DAILY. 28 tablet 5  . losartan (COZAAR) 100 MG tablet TAKE 1 TABLET BY MOUTH EVERY DAY 90 tablet 3  . metoprolol succinate (TOPROL-XL) 50 MG 24 hr tablet TAKE 1 TABLET BY MOUTH EVERY DAY 90 tablet 1  . OLANZapine (ZYPREXA) 2.5 MG tablet Take 1 tablet (2.5 mg total) by mouth at bedtime. 30 tablet 2  . rivastigmine (EXELON) 1.5 MG capsule Take 1.5 mg by mouth 2 (two) times daily.    . vitamin B-12 (CYANOCOBALAMIN) 1000 MCG tablet Take 1,000 mcg  by mouth daily.     No current facility-administered medications for this visit.    OBJECTIVE: BP (!) 160/63 (BP Location: Left Arm, Patient Position: Sitting)   Pulse 83   Temp 98.4 F (36.9 C) (Tympanic)   Wt 140 lb 6.4 oz (63.7 kg)   SpO2 100%   BMI 27.42 kg/m    Body mass index is 27.42 kg/m.    ECOG FS:0 - Asymptomatic   General: Well-developed, well-nourished, no acute distress. Eyes: Pink conjunctiva, anicteric sclera. HEENT: Normocephalic, moist mucous membranes. Lungs: No audible wheezing or coughing. Heart: Regular rate and rhythm. Abdomen: Soft, nontender, no obvious distention. Musculoskeletal: No edema, cyanosis, or clubbing. Neuro: Alert, answering all questions appropriately. Cranial nerves grossly intact. Skin: No rashes or petechiae noted. Psych: Normal affect.   LAB RESULTS:  Appointment on 05/20/2020  Component Date Value Ref Range Status  . Sodium 05/20/2020 139  135 - 145 mmol/L Final  . Potassium 05/20/2020 3.9  3.5 - 5.1 mmol/L Final  . Chloride 05/20/2020 102  98 - 111 mmol/L Final  . CO2 05/20/2020 27  22 - 32 mmol/L Final  . Glucose, Bld 05/20/2020 116* 70 - 99 mg/dL Final   Glucose reference range applies only to samples taken after fasting for at least 8 hours.  . BUN 05/20/2020 13  8 - 23 mg/dL Final  . Creatinine, Ser 05/20/2020 0.91  0.44 - 1.00 mg/dL Final  . Calcium 05/20/2020 8.6* 8.9 - 10.3 mg/dL Final  . Total Protein 05/20/2020 6.5  6.5 - 8.1 g/dL Final  . Albumin 05/20/2020 3.7  3.5 - 5.0 g/dL Final  . AST 05/20/2020 24  15 - 41 U/L Final  . ALT 05/20/2020 15  0 - 44 U/L Final  . Alkaline Phosphatase 05/20/2020 78  38 - 126 U/L Final  . Total Bilirubin 05/20/2020 0.6  0.3 - 1.2 mg/dL Final  . GFR, Estimated 05/20/2020 >60  >60 mL/min Final   Comment: (NOTE) Calculated using the CKD-EPI Creatinine Equation (2021)   . Anion gap 05/20/2020 10  5 - 15 Final   Performed at [facility], [address] [place], [place]  [address]  . WBC 05/20/2020 17.5* 4.0 - 10.5 K/uL Final  . RBC 05/20/2020 4.74  3.87 - 5.11 MIL/uL Final  . Hemoglobin 05/20/2020 11.9* 12.0 - 15.0 g/dL Final  . HCT 05/20/2020 38.0  36 - 46 % Final  . MCV 05/20/2020 80.2  80.0 - 100.0 fL Final  . MCH 05/20/2020 25.1* 26.0 - 34.0 pg Final  . MCHC 05/20/2020 31.3  30.0 - 36.0 g/dL Final  . RDW 05/20/2020 15.9* 11.5 - 15.5 % Final  . Platelets 05/20/2020 297  150 - 400 K/uL Final  . nRBC 05/20/2020 0.0  0.0 - 0.2 % Final  . Neutrophils Relative % 05/20/2020 47  % Final  . Neutro Abs 05/20/2020 8.4* 1.7 - 7.7 K/uL Final  . Band Neutrophils 05/20/2020 1  % Final  .  Lymphocytes Relative 05/20/2020 46  % Final  . Lymphs Abs 05/20/2020 8.1* 0.7 - 4.0 K/uL Final  . Monocytes Relative 05/20/2020 5  % Final  . Monocytes Absolute 05/20/2020 0.9  0.1 - 1.0 K/uL Final  . Eosinophils Relative 05/20/2020 0  % Final  . Eosinophils Absolute 05/20/2020 0.0  0.0 - 0.5 K/uL Final  . Basophils Relative 05/20/2020 0  % Final  . Basophils Absolute 05/20/2020 0.0  0.0 - 0.1 K/uL Final  . WBC Morphology 05/20/2020 DIFF CONFIRMED. CONSISTANT WITH KNOWN CLL. FEW SMUDGE CELLS NOTED.   Final  . RBC Morphology 05/20/2020 UNREMARKABLE   Final  . Smear Review 05/20/2020 Normal platelet morphology   Final   PLATELETS APPEAR ADEQUATE  . Myelocytes 05/20/2020 1  % Final  . Abs Immature Granulocytes 05/20/2020 0.20* 0.00 - 0.07 K/uL Final   Performed at [facility], [address] [place], [place] [address]    STUDIES: No results found.  ASSESSMENT: CLL.  PLAN:    1.  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  CT scan results from April 05, 2020 revealed substantial improvement of patient's widespread lymphadenopathy.  Her white blood cell count continues to trend down and is now 17.5.  No intervention is needed at this time.  Patient initiated Imbruvica in May 2021.  Plan to continue  treatment until progression of disease or intolerable side effects.  Return to clinic in 6 weeks for laboratory work only and then in 3 months for laboratory work and further evaluation.  Will repeat imaging in approximately April 2022.  Appreciate clinical pharmacy input. 2.  Anemia: Hemoglobin improved to 11.9, monitor.  I spent a total of 30 minutes reviewing chart data, face-to-face evaluation with the patient, counseling and coordination of care as detailed above.   Patient expressed understanding and was in agreement with this plan. She also understands that She can call clinic at any time with any questions, concerns, or complaints.    Lloyd Huger, MD   05/21/2020 10:51 AM

## 2020-05-20 ENCOUNTER — Other Ambulatory Visit: Payer: Self-pay

## 2020-05-20 ENCOUNTER — Inpatient Hospital Stay: Payer: PPO | Attending: Oncology

## 2020-05-20 ENCOUNTER — Inpatient Hospital Stay: Payer: PPO | Admitting: Oncology

## 2020-05-20 ENCOUNTER — Encounter: Payer: Self-pay | Admitting: Oncology

## 2020-05-20 ENCOUNTER — Inpatient Hospital Stay: Payer: PPO | Admitting: Pharmacist

## 2020-05-20 VITALS — BP 160/63 | HR 83 | Temp 98.4°F | Wt 140.4 lb

## 2020-05-20 DIAGNOSIS — C9110 Chronic lymphocytic leukemia of B-cell type not having achieved remission: Secondary | ICD-10-CM

## 2020-05-20 DIAGNOSIS — I10 Essential (primary) hypertension: Secondary | ICD-10-CM | POA: Insufficient documentation

## 2020-05-20 DIAGNOSIS — Z79899 Other long term (current) drug therapy: Secondary | ICD-10-CM | POA: Insufficient documentation

## 2020-05-20 DIAGNOSIS — D649 Anemia, unspecified: Secondary | ICD-10-CM | POA: Insufficient documentation

## 2020-05-20 LAB — CBC WITH DIFFERENTIAL/PLATELET
Abs Immature Granulocytes: 0.2 10*3/uL — ABNORMAL HIGH (ref 0.00–0.07)
Band Neutrophils: 1 %
Basophils Absolute: 0 10*3/uL (ref 0.0–0.1)
Basophils Relative: 0 %
Eosinophils Absolute: 0 10*3/uL (ref 0.0–0.5)
Eosinophils Relative: 0 %
HCT: 38 % (ref 36.0–46.0)
Hemoglobin: 11.9 g/dL — ABNORMAL LOW (ref 12.0–15.0)
Lymphocytes Relative: 46 %
Lymphs Abs: 8.1 10*3/uL — ABNORMAL HIGH (ref 0.7–4.0)
MCH: 25.1 pg — ABNORMAL LOW (ref 26.0–34.0)
MCHC: 31.3 g/dL (ref 30.0–36.0)
MCV: 80.2 fL (ref 80.0–100.0)
Monocytes Absolute: 0.9 10*3/uL (ref 0.1–1.0)
Monocytes Relative: 5 %
Myelocytes: 1 %
Neutro Abs: 8.4 10*3/uL — ABNORMAL HIGH (ref 1.7–7.7)
Neutrophils Relative %: 47 %
Platelets: 297 10*3/uL (ref 150–400)
RBC: 4.74 MIL/uL (ref 3.87–5.11)
RDW: 15.9 % — ABNORMAL HIGH (ref 11.5–15.5)
Smear Review: NORMAL
WBC: 17.5 10*3/uL — ABNORMAL HIGH (ref 4.0–10.5)
nRBC: 0 % (ref 0.0–0.2)

## 2020-05-20 LAB — COMPREHENSIVE METABOLIC PANEL
ALT: 15 U/L (ref 0–44)
AST: 24 U/L (ref 15–41)
Albumin: 3.7 g/dL (ref 3.5–5.0)
Alkaline Phosphatase: 78 U/L (ref 38–126)
Anion gap: 10 (ref 5–15)
BUN: 13 mg/dL (ref 8–23)
CO2: 27 mmol/L (ref 22–32)
Calcium: 8.6 mg/dL — ABNORMAL LOW (ref 8.9–10.3)
Chloride: 102 mmol/L (ref 98–111)
Creatinine, Ser: 0.91 mg/dL (ref 0.44–1.00)
GFR, Estimated: >60 mL/min (ref >60)
Glucose, Bld: 116 mg/dL — ABNORMAL HIGH (ref 70–99)
Potassium: 3.9 mmol/L (ref 3.5–5.1)
Sodium: 139 mmol/L (ref 135–145)
Total Bilirubin: 0.6 mg/dL (ref 0.3–1.2)
Total Protein: 6.5 g/dL (ref 6.5–8.1)

## 2020-05-20 NOTE — Progress Notes (Signed)
[facility]  [phone] [phone]  Patient Care Team: [name], MD as PCP - General (Family Medicine) Lloyd Huger, MD as Consulting Physician (Oncology) Corey Skains, MD as Consulting Physician (Cardiology) Brendolyn Patty, MD as Consulting Physician (Dermatology) Birder Robson, MD as Referring Physician (Ophthalmology) Verdia Kuba, [facility] (Inactive) (Pharmacist)   Name of the patient: Barbara Chambers  [id number]  1935-02-25   Date of visit: 05/20/20  HPI: Patient is a 85 y.o. female with chronic lymphocytic leukemia.  Reason for Consult: Oral chemotherapy follow-up for Imbruvica (ibrutinib) therapy.   PAST MEDICAL HISTORY: Past Medical History:  Diagnosis Date  . Chronic lymphocytic leukemia ([place]) 2011  . Hyperlipidemia   . Hypertension     PAST SURGICAL HISTORY:  Past Surgical History:  Procedure Laterality Date  . cataract surgery Bilateral 2005  . CHOLECYSTECTOMY  1991  . RECONSTRUCTION OF EYELID      HEMATOLOGY/ONCOLOGY HISTORY:  Oncology History   No history exists.    ALLERGIES:  is allergic to hctz [hydrochlorothiazide] and penicillins.  MEDICATIONS:  Current Outpatient Medications  Medication Sig Dispense Refill  . amLODipine (NORVASC) 5 MG tablet Take 5 mg by mouth daily.    [name] aspirin 81 MG chewable tablet Chew 81 mg by mouth daily.    [name] atorvastatin (LIPITOR) 40 MG tablet Take 1 tablet (40 mg total) by mouth daily. 90 tablet 1  . COENZYME Q-10 PO Take 200 mg by mouth daily.     . DULoxetine (CYMBALTA) 20 MG capsule TAKE 1 CAPSULE BY MOUTH EVERY DAY 90 capsule 0  . IMBRUVICA 420 MG TABS TAKE 1 TABLET BY MOUTH DAILY. 28 tablet 5  . losartan (COZAAR) 100 MG tablet TAKE 1 TABLET BY MOUTH EVERY DAY 90 tablet 3  . metoprolol succinate (TOPROL-XL) 50 MG 24 hr tablet TAKE 1 TABLET BY MOUTH EVERY DAY 90 tablet 1  . OLANZapine (ZYPREXA) 2.5 MG tablet Take 1 tablet  (2.5 mg total) by mouth at bedtime. 30 tablet 2  . rivastigmine (EXELON) 1.5 MG capsule Take 1.5 mg by mouth 2 (two) times daily.    . vitamin B-12 (CYANOCOBALAMIN) 1000 MCG tablet Take 1,000 mcg by mouth daily.     No current facility-administered medications for this visit.    VITAL SIGNS: There were no vitals taken for this visit. There were no vitals filed for this visit.  Estimated body mass index is 27.42 kg/m as calculated from the following:   Height as of 10/31/19: 5' (1.524 m).   Weight as of an earlier encounter on 05/20/20: 63.7 kg (140 lb 6.4 oz).  LABS: CBC:    Component Value Date/Time   WBC 17.5 (H) 05/20/2020 1300   HGB 11.9 (L) 05/20/2020 1300   HGB 12.9 09/28/2019 0936   HCT 38.0 05/20/2020 1300   HCT 41.2 09/28/2019 0936   PLT 297 05/20/2020 1300   PLT 262 09/28/2019 0936   MCV 80.2 05/20/2020 1300   MCV 87 09/28/2019 0936   MCV 84 03/19/2014 1000   NEUTROABS 8.4 (H) 05/20/2020 1300   NEUTROABS 6.1 03/01/2019 1050   NEUTROABS 4.9 03/19/2014 1000   LYMPHSABS 8.1 (H) 05/20/2020 1300   LYMPHSABS 17.1 (H) 03/01/2019 1050   LYMPHSABS 8.1 (H) 03/19/2014 1000   MONOABS 0.9 05/20/2020 1300   MONOABS 0.6 03/19/2014 1000   EOSABS 0.0 05/20/2020 1300   EOSABS 0.0 03/01/2019 1050   EOSABS 0.1 03/19/2014 1000   BASOSABS 0.0 05/20/2020 1300  BASOSABS 0.0 03/01/2019 1050   BASOSABS 0.2 (H) 03/19/2014 1000   Comprehensive Metabolic Panel:    Component Value Date/Time   NA 139 05/20/2020 1300   NA 141 09/28/2019 0936   K 3.9 05/20/2020 1300   CL 102 05/20/2020 1300   CO2 27 05/20/2020 1300   BUN 13 05/20/2020 1300   BUN 16 09/28/2019 0936   CREATININE 0.91 05/20/2020 1300   CREATININE 0.78 04/20/2017 0914   GLUCOSE 116 (H) 05/20/2020 1300   CALCIUM 8.6 (L) 05/20/2020 1300   AST 24 05/20/2020 1300   AST 25 02/15/2012 1101   ALT 15 05/20/2020 1300   ALT 27 02/15/2012 1101   ALKPHOS 78 05/20/2020 1300   ALKPHOS 89 02/15/2012 1101   BILITOT 0.6 05/20/2020  1300   BILITOT 0.4 09/28/2019 0936   BILITOT 0.4 02/15/2012 1101   PROT 6.5 05/20/2020 1300   PROT 6.2 09/28/2019 0936   PROT 6.9 02/15/2012 1101   ALBUMIN 3.7 05/20/2020 1300   ALBUMIN 4.1 09/28/2019 0936   ALBUMIN 3.7 02/15/2012 1101    RADIOGRAPHIC STUDIES: No results found.   Assessment and Plan-  Reviewed CBC and CMP, plan to continue patient on 420mg  Imbruvica (ibrutinib). Overall the patient is feeling well.   Oral Chemotherapy Side Effect/Intolerance:  -Diarrhea: patient reported another incidence of major diarrhea. This has happened twice since she has started the ibrutinib. She reported that following the diarrhea incidence her bowel return to normal without any use of antidiarrhea. Encouraged the patient to use loperamide if the diarrhea ever starts to happen more regularly.  -Edema: Continues to has noticed some edema in her feet/ankles. She reports elevating her feet when possible and wearing compression stockings.   Oral Chemotherapy Adherence: no missed doses  Medication Access Issues:  no issues, [facility] due to call the patient for refill next week. Set-up refill while in office today. Ship 12/2, expected delivery 12/3.  Patient expressed understanding and was in agreement with this plan. She also understands that She can call clinic at any time with any questions, concerns, or complaints.   Thank you for allowing me to participate in the care of this very pleasant patient.   Time Total: 10 mins  Visit consisted of counseling and education on dealing with issues of symptom management in the setting of serious and potentially life-threatening illness.Greater than 50%  of this time was spent counseling and coordinating care related to the above assessment and plan.  Signed by: Darl Pikes, PharmD, BCPS, Salley Slaughter, CPP Hematology/Oncology Clinical Pharmacist Practitioner [facility]/HP/AP [facility] [phone]  05/20/2020 1:59 PM

## 2020-05-22 ENCOUNTER — Other Ambulatory Visit: Payer: Self-pay | Admitting: Family Medicine

## 2020-05-22 DIAGNOSIS — B89 Unspecified parasitic disease: Secondary | ICD-10-CM

## 2020-05-22 NOTE — Telephone Encounter (Signed)
Requested medication (s) are due for refill today: DUe 05/29/20  Requested medication (s) are on the active medication list: yes  Last refill: 02/29/20  #30 2 refills  Future visit scheduled no  Notes to clinic:Not delegated  Requested Prescriptions  Pending Prescriptions Disp Refills   OLANZapine (ZYPREXA) 2.5 MG tablet [Pharmacy Med Name: OLANZAPINE 2.5 MG TABLET] 30 tablet 2    Sig: Take 1 tablet (2.5 mg total) by mouth at bedtime.      Not Delegated - Psychiatry:  Antipsychotics - Second Generation (Atypical) - olanzapine Failed - 05/22/2020  1:51 AM      Failed - This refill cannot be delegated      Failed - Last BP in normal range    BP Readings from Last 1 Encounters:  05/20/20 (!) 160/63          Passed - ALT in normal range and within 360 days    ALT  Date Value Ref Range Status  05/20/2020 15 0 - 44 U/L Final   SGPT (ALT)  Date Value Ref Range Status  02/15/2012 27 12 - 78 U/L Final          Passed - AST in normal range and within 360 days    AST  Date Value Ref Range Status  05/20/2020 24 15 - 41 U/L Final   SGOT(AST)  Date Value Ref Range Status  02/15/2012 25 15 - 58 Unit/L Final          Passed - Valid encounter within last 6 months    Recent Outpatient Visits           2 months ago Benign essential HTN   [place], [name], MD   7 months ago Encounter for annual physical exam   [facility] [place], [name], MD   8 months ago [place], [name], MD   10 months ago Dry skin dermatitis   [place], [name], MD   1 year ago Benign essential HTN   [facility] [place], [name], MD

## 2020-05-22 NOTE — Telephone Encounter (Signed)
Refilled x 1 needs follow up appointment around March 2022 per PCP last note [name], [name], MD.

## 2020-05-24 ENCOUNTER — Ambulatory Visit: Payer: Self-pay

## 2020-05-24 DIAGNOSIS — I10 Essential (primary) hypertension: Secondary | ICD-10-CM | POA: Diagnosis not present

## 2020-05-24 NOTE — Telephone Encounter (Signed)
Returned call to patient and she was reporting BP elevation she has noticed since Wednesday.  She states that today 179/71 HR74 and as we spoke she checked a BP as 195/100. She states HR was about the same.   She states that she has no symptoms no headache, SOB, chest pain or weakness.  She states that she has taken all her morning medications just about 1 hour ago. Per protocol patient will go to UC for evaluation.  She reports she has appointment scheduled for office on Monday. Care advice read to patient.  She verbalized understanding.  Reason for Disposition . Systolic BP  >= 161 OR Diastolic >= 096  Answer Assessment - Initial Assessment Questions 1. BLOOD PRESSURE: "What is the blood pressure?" "Did you take at least two measurements 5 minutes apart?"     179/71 HR 74, 195/100  2. ONSET: "When did you take your blood pressure?"     Wednesday night 3. HOW: "How did you obtain the blood pressure?" (e.g., visiting nurse, automatic home BP monitor)    Home  4. HISTORY: "Do you have a history of high blood pressure?"    yes 5. MEDICATIONS: "Are you taking any medications for blood pressure?" "Have you missed any doses recently?"     Amlodipine losartan metprolol 6. OTHER SYMPTOMS: "Do you have any symptoms?" (e.g., headache, chest pain, blurred vision, difficulty breathing, weakness)    Feel fine 7. PREGNANCY: "Is there any chance you are pregnant?" "When was your last menstrual period?"     N/A  Protocols used: BLOOD PRESSURE - HIGH-A-AH

## 2020-05-25 ENCOUNTER — Other Ambulatory Visit: Payer: Self-pay | Admitting: Family Medicine

## 2020-05-25 NOTE — Telephone Encounter (Signed)
Requested medications are due for refill today yes  Requested medications are on the active medication list yes  Last refill 9/2  Last visit 02/2020  Future visit scheduled yes, Monday 11/29  Notes to clinic Historical provider

## 2020-05-27 ENCOUNTER — Other Ambulatory Visit: Payer: Self-pay

## 2020-05-27 ENCOUNTER — Encounter: Payer: Self-pay | Admitting: Family Medicine

## 2020-05-27 ENCOUNTER — Ambulatory Visit (INDEPENDENT_AMBULATORY_CARE_PROVIDER_SITE_OTHER): Payer: PPO | Admitting: Family Medicine

## 2020-05-27 VITALS — BP 141/68 | HR 64 | Temp 97.8°F | Wt 140.0 lb

## 2020-05-27 DIAGNOSIS — E782 Mixed hyperlipidemia: Secondary | ICD-10-CM | POA: Diagnosis not present

## 2020-05-27 DIAGNOSIS — W19XXXA Unspecified fall, initial encounter: Secondary | ICD-10-CM | POA: Diagnosis not present

## 2020-05-27 DIAGNOSIS — I10 Essential (primary) hypertension: Secondary | ICD-10-CM

## 2020-05-27 MED ORDER — METOPROLOL SUCCINATE ER 100 MG PO TB24: 100.0000 mg | ORAL_TABLET | Freq: Every day | ORAL | 1 refills | Status: DC

## 2020-05-27 NOTE — Progress Notes (Signed)
Established patient visit   Patient: Barbara Chambers   DOB: [birth year]   85 y.o. Female  MRN: [id number] Visit Date: 05/27/2020  Today's healthcare provider: Lavon Paganini, MD   Chief Complaint  Patient presents with  . Hypertension   Subjective    HPI  Hypertension, follow-up  BP Readings from Last 3 Encounters:  05/27/20 (!) 141/68  05/20/20 (!) 160/63  02/29/20 (!) 144/66   Wt Readings from Last 3 Encounters:  05/27/20 140 lb (63.5 kg)  05/20/20 140 lb 6.4 oz (63.7 kg)  02/29/20 141 lb (64 kg)     She was last seen for hypertension 3 days ago.  BP at that visit was 209/75 (At [facility] walk in clinic). Management since that visit includes Increase Metoprolol to 100mg  daily.  She reports excellent compliance with treatment. She is not having side effects.  She is following a Low Sodium diet. She is exercising. She does not smoke.  Use of agents associated with hypertension: none.   Outside blood pressures are improving at home. Symptoms: No chest pain No chest pressure  No palpitations No syncope  No dyspnea No orthopnea  No paroxysmal nocturnal dyspnea Yes lower extremity edema   Pertinent labs: Lab Results  Component Value Date   CHOL 179 09/28/2019   HDL 50 09/28/2019   LDLCALC 108 (H) 09/28/2019   TRIG 119 09/28/2019   CHOLHDL 3.6 09/28/2019   Lab Results  Component Value Date   NA 139 05/20/2020   K 3.9 05/20/2020   CREATININE 0.91 05/20/2020   GFRNONAA >60 05/20/2020   GFRAA >60 03/07/2020   GLUCOSE 116 (H) 05/20/2020     The ASCVD Risk score (Goff DC Jr., et al., 2013) failed to calculate for the following reasons:   The 2013 ASCVD risk score is only valid for ages 76 to 46   ---------------------------------------------------------------------------------------------------   Patient Active Problem List   Diagnosis Date Noted  . Bilateral leg edema 11/09/2019  . Atherosclerosis of abdominal aorta ([place]) 11/09/2019  .  Senile purpura ([place]) 09/28/2019  . Cervical adenopathy 09/28/2019  . Memory loss 09/28/2019  . Actinic keratosis 09/01/2019  . Rosacea 09/01/2019  . UTI (urinary tract infection) 03/03/2019  . Hyponatremia 03/03/2019  . Adjustment disorder with mixed anxiety and depressed mood 03/01/2019  . Obsessional thoughts 03/01/2019  . Delusional disorder ([place]) 03/01/2019  . Shortness of breath 07/11/2018  . Vulvar irritation 07/11/2018  . Dizziness 05/18/2017  . Leg cramps 04/20/2017  . Arthritis 03/08/2015  . Anxiety 10/25/2014  . CLL (chronic lymphocytic leukemia) ([place]) 10/25/2014  . HLD (hyperlipidemia) 10/25/2014  . Subclinical hypothyroidism 10/25/2014  . Abnormal ECG 10/11/2014  . Benign essential HTN 10/11/2014   Past Medical History:  Diagnosis Date  . Chronic lymphocytic leukemia ([place]) 2011  . Hyperlipidemia   . Hypertension    Social History   Tobacco Use  . Smoking status: Never Smoker  . Smokeless tobacco: Never Used  Vaping Use  . Vaping Use: Never used  Substance Use Topics  . Alcohol use: No  . Drug use: No   Allergies  Allergen Reactions  . Hctz [Hydrochlorothiazide]     Hyponatremia  . Penicillins Rash     Medications: Outpatient Medications Prior to Visit  Medication Sig  . amLODipine (NORVASC) 5 MG tablet Take 5 mg by mouth daily.  [name] COENZYME Q-10 PO Take 200 mg by mouth daily.   . DULoxetine (CYMBALTA) 20 MG capsule TAKE 1 CAPSULE BY MOUTH EVERY  DAY  . IMBRUVICA 420 MG TABS TAKE 1 TABLET BY MOUTH DAILY.  [name] losartan (COZAAR) 100 MG tablet TAKE 1 TABLET BY MOUTH EVERY DAY  . OLANZapine (ZYPREXA) 2.5 MG tablet TAKE 1 TABLET (2.5 MG TOTAL) BY MOUTH AT BEDTIME.  . rivastigmine (EXELON) 1.5 MG capsule Take 1.5 mg by mouth 2 (two) times daily.  . vitamin B-12 (CYANOCOBALAMIN) 1000 MCG tablet Take 1,000 mcg by mouth daily.  . [DISCONTINUED] aspirin 81 MG chewable tablet Chew 81 mg by mouth daily.  . [DISCONTINUED] metoprolol succinate (TOPROL-XL) 50 MG 24  hr tablet TAKE 1 TABLET BY MOUTH EVERY DAY (Patient taking differently: 100 mg. )  . [DISCONTINUED] atorvastatin (LIPITOR) 40 MG tablet Take 1 tablet (40 mg total) by mouth daily. (Patient not taking: Reported on 05/27/2020)   No facility-administered medications prior to visit.    Review of Systems  Constitutional: Negative.   Respiratory: Negative.   Cardiovascular: Positive for leg swelling. Negative for chest pain and palpitations.  Gastrointestinal: Negative.   Neurological: Negative for dizziness and light-headedness.      Objective    BP (!) 141/68 (BP Location: Left Arm, Patient Position: Sitting, Cuff Size: Large)   Pulse 64   Temp 97.8 F (36.6 C) (Oral)   Wt 140 lb (63.5 kg)   BMI 27.34 kg/m    Physical Exam Vitals reviewed.  Constitutional:      General: She is not in acute distress.    Appearance: Normal appearance. She is well-developed. She is not diaphoretic.  HENT:     Head: Normocephalic and atraumatic.  Eyes:     General: No scleral icterus.    Conjunctiva/sclera: Conjunctivae normal.  Neck:     Thyroid: No thyromegaly.  Cardiovascular:     Rate and Rhythm: Normal rate and regular rhythm.     Pulses: Normal pulses.     Heart sounds: Normal heart sounds. No murmur heard.   Pulmonary:     Effort: Pulmonary effort is normal. No respiratory distress.     Breath sounds: Normal breath sounds. No wheezing, rhonchi or rales.  Musculoskeletal:     Cervical back: Neck supple.     Right lower leg: No edema.     Left lower leg: No edema.  Lymphadenopathy:     Cervical: No cervical adenopathy.  Skin:    General: Skin is warm and dry.     Findings: No rash.  Neurological:     Mental Status: She is alert and oriented to person, place, and time. Mental status is at baseline.  Psychiatric:        Mood and Affect: Mood normal.        Behavior: Behavior normal.       No results found for any visits on 05/27/20.  Assessment & Plan     Problem List  Items Addressed This Visit      Cardiovascular and Mediastinum   Benign essential HTN - Primary   Relevant Medications   metoprolol succinate (TOPROL-XL) 100 MG 24 hr tablet     Other   HLD (hyperlipidemia)   Relevant Medications   metoprolol succinate (TOPROL-XL) 100 MG 24 hr tablet    Other Visit Diagnoses    Fall, initial encounter         -Fall over the weekend -Mechanical fall -No injury -Able to ambulate without assistance today -Return precautions discussed    Return in about 5 months (around 10/25/2020) for CPE, AWV, as scheduled.      I,  Lavon Paganini, MD, have reviewed all documentation for this visit. The documentation on 05/27/20 for the exam, diagnosis, procedures, and orders are all accurate and complete.   Deward Sebek, [name], MD, MPH [facility]

## 2020-05-27 NOTE — Assessment & Plan Note (Signed)
Well controlled Continue current medications Reviewed recent metabolic panel F/u in 5 months

## 2020-05-27 NOTE — Assessment & Plan Note (Signed)
Discussed risk and benefits of statin and aspirin therapy at her advanced age for primary prevention We will plan to deprescribe statin and aspirin

## 2020-05-27 NOTE — Patient Instructions (Signed)

## 2020-05-29 DIAGNOSIS — Z20822 Contact with and (suspected) exposure to covid-19: Secondary | ICD-10-CM | POA: Diagnosis not present

## 2020-05-30 MED FILL — IMBRUVICA 420 MG TAB: 420 | 28 days supply | Qty: 28 | Fill #1

## 2020-06-01 ENCOUNTER — Other Ambulatory Visit: Payer: Self-pay | Admitting: Family Medicine

## 2020-06-17 ENCOUNTER — Other Ambulatory Visit: Payer: Self-pay | Admitting: Family Medicine

## 2020-06-17 DIAGNOSIS — B89 Unspecified parasitic disease: Secondary | ICD-10-CM

## 2020-06-17 MED ORDER — OLANZAPINE 2.5 MG PO TABS: 2.5000 mg | ORAL_TABLET | Freq: Every day | ORAL | 0 refills | Status: DC

## 2020-06-17 NOTE — Telephone Encounter (Signed)
[facility] faxed refill request for the following medications:  OLANZapine (ZYPREXA) 2.5 MG tablet  Please advise. Thanks, [employer]

## 2020-06-19 DIAGNOSIS — R600 Localized edema: Secondary | ICD-10-CM | POA: Diagnosis not present

## 2020-06-19 DIAGNOSIS — E782 Mixed hyperlipidemia: Secondary | ICD-10-CM | POA: Diagnosis not present

## 2020-06-19 DIAGNOSIS — I700 Atherosclerosis of aorta: Secondary | ICD-10-CM | POA: Diagnosis not present

## 2020-06-19 DIAGNOSIS — R0602 Shortness of breath: Secondary | ICD-10-CM | POA: Diagnosis not present

## 2020-06-19 DIAGNOSIS — I10 Essential (primary) hypertension: Secondary | ICD-10-CM | POA: Diagnosis not present

## 2020-07-01 MED FILL — IMBRUVICA 420 MG TAB: 420 | 28 days supply | Qty: 28 | Fill #2

## 2020-07-02 ENCOUNTER — Other Ambulatory Visit: Payer: Self-pay

## 2020-07-02 ENCOUNTER — Inpatient Hospital Stay: Payer: PPO | Attending: Oncology

## 2020-07-02 DIAGNOSIS — C9110 Chronic lymphocytic leukemia of B-cell type not having achieved remission: Secondary | ICD-10-CM

## 2020-07-02 LAB — CBC WITH DIFFERENTIAL/PLATELET
Abs Immature Granulocytes: 0.13 10*3/uL — ABNORMAL HIGH (ref 0.00–0.07)
Basophils Absolute: 0.1 10*3/uL (ref 0.0–0.1)
Basophils Relative: 0 %
Eosinophils Absolute: 0.1 10*3/uL (ref 0.0–0.5)
Eosinophils Relative: 0 %
HCT: 41.4 % (ref 36.0–46.0)
Hemoglobin: 13 g/dL (ref 12.0–15.0)
Immature Granulocytes: 1 %
Lymphocytes Relative: 60 %
Lymphs Abs: 12.4 10*3/uL — ABNORMAL HIGH (ref 0.7–4.0)
MCH: 24.8 pg — ABNORMAL LOW (ref 26.0–34.0)
MCHC: 31.4 g/dL (ref 30.0–36.0)
MCV: 79 fL — ABNORMAL LOW (ref 80.0–100.0)
Monocytes Absolute: 1.1 10*3/uL — ABNORMAL HIGH (ref 0.1–1.0)
Monocytes Relative: 6 %
Neutro Abs: 6.9 10*3/uL (ref 1.7–7.7)
Neutrophils Relative %: 33 %
Platelets: 314 10*3/uL (ref 150–400)
RBC: 5.24 MIL/uL — ABNORMAL HIGH (ref 3.87–5.11)
RDW: 15.3 % (ref 11.5–15.5)
Smear Review: ADEQUATE
WBC: 20.8 10*3/uL — ABNORMAL HIGH (ref 4.0–10.5)
nRBC: 0 % (ref 0.0–0.2)

## 2020-07-02 LAB — COMPREHENSIVE METABOLIC PANEL
ALT: 16 U/L (ref 0–44)
AST: 26 U/L (ref 15–41)
Albumin: 4.1 g/dL (ref 3.5–5.0)
Alkaline Phosphatase: 93 U/L (ref 38–126)
Anion gap: 11 (ref 5–15)
BUN: 20 mg/dL (ref 8–23)
CO2: 30 mmol/L (ref 22–32)
Calcium: 9.1 mg/dL (ref 8.9–10.3)
Chloride: 96 mmol/L — ABNORMAL LOW (ref 98–111)
Creatinine, Ser: 0.95 mg/dL (ref 0.44–1.00)
GFR, Estimated: 59 mL/min — ABNORMAL LOW (ref >60)
Glucose, Bld: 109 mg/dL — ABNORMAL HIGH (ref 70–99)
Potassium: 3.8 mmol/L (ref 3.5–5.1)
Sodium: 137 mmol/L (ref 135–145)
Total Bilirubin: 0.7 mg/dL (ref 0.3–1.2)
Total Protein: 7.2 g/dL (ref 6.5–8.1)

## 2020-07-08 ENCOUNTER — Telehealth: Payer: Self-pay

## 2020-07-08 NOTE — Telephone Encounter (Signed)
Copied from [place] [phone]. Topic: General - Other >> Jul 08, 2020 10:26 AM Alanda Slim E wrote: Reason for CRM: Pts son needs a copy of all meds pt should be taking / Pt has had some confusion with her meds/ he asked if this can be emailed to him at [email or web address] or let him know if he needs to pick it up/ please advise

## 2020-07-08 NOTE — Telephone Encounter (Signed)
Mr. [name] advised.

## 2020-07-08 NOTE — Telephone Encounter (Signed)
Ok to print med list for pickup if he is on DPR (pretty sure he is)

## 2020-07-08 NOTE — Telephone Encounter (Signed)
Please advise 

## 2020-07-17 DIAGNOSIS — I10 Essential (primary) hypertension: Secondary | ICD-10-CM | POA: Diagnosis not present

## 2020-07-17 DIAGNOSIS — R600 Localized edema: Secondary | ICD-10-CM | POA: Diagnosis not present

## 2020-07-17 DIAGNOSIS — E782 Mixed hyperlipidemia: Secondary | ICD-10-CM | POA: Diagnosis not present

## 2020-08-01 MED FILL — IMBRUVICA 420 MG TAB: 420 | 28 days supply | Qty: 28 | Fill #3

## 2020-08-15 ENCOUNTER — Other Ambulatory Visit: Payer: Self-pay | Admitting: Family Medicine

## 2020-08-15 DIAGNOSIS — B89 Unspecified parasitic disease: Secondary | ICD-10-CM

## 2020-08-15 NOTE — Telephone Encounter (Signed)
LOV 11/21 Last RF 05/2020 for # 30

## 2020-08-15 NOTE — Telephone Encounter (Signed)
Requested medication (s) are due for refill today: yes  Requested medication (s) are on the active medication list: yes  Last refill:  07/24/20  Future visit scheduled: yes  Notes to clinic:: not delegated    Requested Prescriptions  Pending Prescriptions Disp Refills   OLANZapine (ZYPREXA) 2.5 MG tablet [Pharmacy Med Name: OLANZAPINE 2.5 MG TABLET] 30 tablet 0    Sig: TAKE 1 TABLET BY MOUTH AT BEDTIME.      Not Delegated - Psychiatry:  Antipsychotics - Second Generation (Atypical) - olanzapine Failed - 08/15/2020  7:21 AM      Failed - This refill cannot be delegated      Failed - Last BP in normal range    BP Readings from Last 1 Encounters:  05/27/20 (!) 141/68          Passed - ALT in normal range and within 360 days    ALT  Date Value Ref Range Status  07/02/2020 16 0 - 44 U/L Final   SGPT (ALT)  Date Value Ref Range Status  02/15/2012 27 12 - 78 U/L Final          Passed - AST in normal range and within 360 days    AST  Date Value Ref Range Status  07/02/2020 26 15 - 41 U/L Final   SGOT(AST)  Date Value Ref Range Status  02/15/2012 25 15 - 21 Unit/L Final          Passed - Valid encounter within last 6 months    Recent Outpatient Visits           2 months ago Benign essential HTN   [place], [name], MD   5 months ago Benign essential HTN   [place], [name], MD   10 months ago Encounter for annual physical exam   [facility], [name], MD   11 months ago Rosacea   [facility] [place], [name], MD   1 year ago Dry skin dermatitis   [facility] [place], [name], MD

## 2020-08-17 DIAGNOSIS — U071 COVID-19: Secondary | ICD-10-CM | POA: Diagnosis not present

## 2020-08-17 DIAGNOSIS — Z20822 Contact with and (suspected) exposure to covid-19: Secondary | ICD-10-CM | POA: Diagnosis not present

## 2020-08-18 NOTE — Progress Notes (Deleted)
[place]  [phone]  [phone]     Barbara Chambers DOB: 04-07-1935  MR#: [id number]  QMG#:[id number]  Patient Care Team: [name], MD as PCP - General (Family Medicine) Lloyd Huger, MD as Consulting Physician (Oncology) Corey Skains, MD as Consulting Physician (Cardiology) Brendolyn Patty, MD as Consulting Physician (Dermatology) Birder Robson, MD as Referring Physician (Ophthalmology) Verdia Kuba, [facility] (Inactive) (Pharmacist)   CHIEF COMPLAINT: CLL.  INTERVAL HISTORY: Patient returns to clinic today for repeat laboratory work and further evaluation.  She continues to tolerate Imbruvica well without significant side effects.  She denies any weakness or fatigue.  She continues to complain of a poor memory.  She has no neurologic complaints. She denies any fevers, night sweats, or weight loss. She denies any chest pain, shortness of breath, cough, or hemoptysis.  She denies any nausea, vomiting, constipation, or diarrhea. She has no urinary complaints.  Patient offers no further specific complaints today.  REVIEW OF SYSTEMS:   Review of Systems  Constitutional: Negative.  Negative for diaphoresis, fever, malaise/fatigue and weight loss.  Respiratory: Negative.  Negative for cough and shortness of breath.   Cardiovascular: Negative.  Negative for chest pain and leg swelling.  Gastrointestinal: Negative.  Negative for abdominal pain.  Genitourinary: Negative.  Negative for dysuria.  Musculoskeletal: Negative.  Negative for back pain.  Skin: Negative.  Negative for rash.  Neurological: Negative.  Negative for dizziness, sensory change, focal weakness, weakness and headaches.  Psychiatric/Behavioral: Positive for memory loss. Negative for depression. The patient is not nervous/anxious.     As per HPI. Otherwise, a complete review of systems is negative.  PAST MEDICAL HISTORY: Past Medical History:  Diagnosis Date  .  Chronic lymphocytic leukemia ([place]) 2011  . Hyperlipidemia   . Hypertension     PAST SURGICAL HISTORY: Past Surgical History:  Procedure Laterality Date  . cataract surgery Bilateral 2005  . CHOLECYSTECTOMY  1991  . RECONSTRUCTION OF EYELID      FAMILY HISTORY Family History  Problem Relation Age of Onset  . Hypertension Mother   . Stroke Mother   . CAD Mother   . Hypertension Father   . Heart attack Father   . Hypertension Brother   . Seizures Brother   . Hypertension Son   . Breast cancer Neg Hx     GYNECOLOGIC HISTORY:  No LMP recorded. Patient is postmenopausal.     ADVANCED DIRECTIVES:    HEALTH MAINTENANCE: Social History   Tobacco Use  . Smoking status: Never Smoker  . Smokeless tobacco: Never Used  Vaping Use  . Vaping Use: Never used  Substance Use Topics  . Alcohol use: No  . Drug use: No     Colonoscopy:  PAP:  Bone density:  Lipid panel:  Allergies  Allergen Reactions  . Hctz [Hydrochlorothiazide]     Hyponatremia  . Penicillins Rash    Current Outpatient Medications  Medication Sig Dispense Refill  . amLODipine (NORVASC) 5 MG tablet TAKE 1 TABLET BY MOUTH EVERY DAY 90 tablet 1  . COENZYME Q-10 PO Take 200 mg by mouth daily.     . DULoxetine (CYMBALTA) 20 MG capsule TAKE 1 CAPSULE BY MOUTH EVERY DAY 90 capsule 0  . IMBRUVICA 420 MG TABS TAKE 1 TABLET BY MOUTH DAILY. 28 tablet 5  . losartan (COZAAR) 100 MG tablet TAKE 1 TABLET BY MOUTH EVERY DAY 90 tablet 0  . metoprolol succinate (TOPROL-XL) 100 MG 24 hr tablet  Take 1 tablet (100 mg total) by mouth daily. Take with or immediately following a meal. 90 tablet 1  . OLANZapine (ZYPREXA) 2.5 MG tablet TAKE 1 TABLET BY MOUTH AT BEDTIME. 30 tablet 2  . rivastigmine (EXELON) 1.5 MG capsule Take 1.5 mg by mouth 2 (two) times daily.    . vitamin B-12 (CYANOCOBALAMIN) 1000 MCG tablet Take 1,000 mcg by mouth daily.     No current facility-administered medications for this visit.     OBJECTIVE: There were no vitals taken for this visit.   There is no height or weight on file to calculate BMI.    ECOG FS:0 - Asymptomatic   General: Well-developed, well-nourished, no acute distress. Eyes: Pink conjunctiva, anicteric sclera. HEENT: Normocephalic, moist mucous membranes. Lungs: No audible wheezing or coughing. Heart: Regular rate and rhythm. Abdomen: Soft, nontender, no obvious distention. Musculoskeletal: No edema, cyanosis, or clubbing. Neuro: Alert, answering all questions appropriately. Cranial nerves grossly intact. Skin: No rashes or petechiae noted. Psych: Normal affect.   LAB RESULTS:  No visits with results within 3 Day(s) from this visit.  Latest known visit with results is:  Appointment on 07/02/2020  Component Date Value Ref Range Status  . Sodium 07/02/2020 137  135 - 145 mmol/L Final  . Potassium 07/02/2020 3.8  3.5 - 5.1 mmol/L Final  . Chloride 07/02/2020 96* 98 - 111 mmol/L Final  . CO2 07/02/2020 30  22 - 32 mmol/L Final  . Glucose, Bld 07/02/2020 109* 70 - 99 mg/dL Final   Glucose reference range applies only to samples taken after fasting for at least 8 hours.  . BUN 07/02/2020 20  8 - 23 mg/dL Final  . Creatinine, Ser 07/02/2020 0.95  0.44 - 1.00 mg/dL Final  . Calcium 07/02/2020 9.1  8.9 - 10.3 mg/dL Final  . Total Protein 07/02/2020 7.2  6.5 - 8.1 g/dL Final  . Albumin 07/02/2020 4.1  3.5 - 5.0 g/dL Final  . AST 07/02/2020 26  15 - 41 U/L Final  . ALT 07/02/2020 16  0 - 44 U/L Final  . Alkaline Phosphatase 07/02/2020 93  38 - 126 U/L Final  . Total Bilirubin 07/02/2020 0.7  0.3 - 1.2 mg/dL Final  . GFR, Estimated 07/02/2020 59* >60 mL/min Final   Comment: (NOTE) Calculated using the CKD-EPI Creatinine Equation (2021)   . Anion gap 07/02/2020 11  5 - 15 Final   Performed at [facility], [address] [place], [place] [address]  . WBC 07/02/2020 20.8* 4.0 - 10.5 K/uL Final  . RBC 07/02/2020 5.24* 3.87 - 5.11 MIL/uL Final   . Hemoglobin 07/02/2020 13.0  12.0 - 15.0 g/dL Final  . HCT 07/02/2020 41.4  36.0 - 46.0 % Final  . MCV 07/02/2020 79.0* 80.0 - 100.0 fL Final  . MCH 07/02/2020 24.8* 26.0 - 34.0 pg Final  . MCHC 07/02/2020 31.4  30.0 - 36.0 g/dL Final  . RDW 07/02/2020 15.3  11.5 - 15.5 % Final  . Platelets 07/02/2020 314  150 - 400 K/uL Final  . nRBC 07/02/2020 0.0  0.0 - 0.2 % Final  . Neutrophils Relative % 07/02/2020 33  % Final  . Neutro Abs 07/02/2020 6.9  1.7 - 7.7 K/uL Final  . Lymphocytes Relative 07/02/2020 60  % Final  . Lymphs Abs 07/02/2020 12.4* 0.7 - 4.0 K/uL Final  . Monocytes Relative 07/02/2020 6  % Final  . Monocytes Absolute 07/02/2020 1.1* 0.1 - 1.0 K/uL Final  . Eosinophils Relative 07/02/2020 0  %  Final  . Eosinophils Absolute 07/02/2020 0.1  0.0 - 0.5 K/uL Final  . Basophils Relative 07/02/2020 0  % Final  . Basophils Absolute 07/02/2020 0.1  0.0 - 0.1 K/uL Final  . WBC Morphology 07/02/2020 DIFF CONFIRMED BY MANUAL. OCC SMUDGE CELLS AND FEW ABNORMAL LYMPHOCYTES CONSISTANT WITH KNOWN CLL.   Final  . RBC Morphology 07/02/2020 UNREMARKABLE   Final  . Smear Review 07/02/2020 PLATELETS APPEAR ADEQUATE   Final   PLATELETS VARY IN SIZE WITH LARGE BODY PLATELETS PRESENT.  . Immature Granulocytes 07/02/2020 1  % Final  . Abs Immature Granulocytes 07/02/2020 0.13* 0.00 - 0.07 K/uL Final   Performed at [facility], [address] [place], [place] [address]    STUDIES: No results found.  ASSESSMENT: CLL.  PLAN:    1.  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  CT scan results from April 05, 2020 revealed substantial improvement of patient's widespread lymphadenopathy.  Her white blood cell count continues to trend down and is now 17.5.  No intervention is needed at this time.  Patient initiated Imbruvica in May 2021.  Plan to continue treatment until progression of disease or intolerable side effects.   Return to clinic in 6 weeks for laboratory work only and then in 3 months for laboratory work and further evaluation.  Will repeat imaging in approximately April 2022.  Appreciate clinical pharmacy input. 2.  Anemia: Hemoglobin improved to 11.9, monitor.  I spent a total of 30 minutes reviewing chart data, face-to-face evaluation with the patient, counseling and coordination of care as detailed above.   Patient expressed understanding and was in agreement with this plan. She also understands that She can call clinic at any time with any questions, concerns, or complaints.    Lloyd Huger, MD   08/18/2020 9:50 AM

## 2020-08-19 DIAGNOSIS — Z7189 Other specified counseling: Secondary | ICD-10-CM | POA: Diagnosis not present

## 2020-08-19 DIAGNOSIS — U071 COVID-19: Secondary | ICD-10-CM | POA: Diagnosis not present

## 2020-08-21 DIAGNOSIS — U071 COVID-19: Secondary | ICD-10-CM | POA: Diagnosis not present

## 2020-08-22 ENCOUNTER — Ambulatory Visit: Payer: PPO | Admitting: Pharmacist

## 2020-08-22 ENCOUNTER — Ambulatory Visit: Payer: PPO | Admitting: Oncology

## 2020-08-22 ENCOUNTER — Other Ambulatory Visit: Payer: PPO

## 2020-08-22 DIAGNOSIS — C9110 Chronic lymphocytic leukemia of B-cell type not having achieved remission: Secondary | ICD-10-CM

## 2020-08-24 NOTE — Progress Notes (Signed)
[place]  [phone]  [phone]     Barbara Chambers DOB: 13-Aug-1934  MR#: [id number]  UMP#:[id number]  Patient Care Team: [name], MD as PCP - General (Family Medicine) Lloyd Huger, MD as Consulting Physician (Oncology) Corey Skains, MD as Consulting Physician (Cardiology) Brendolyn Patty, MD as Consulting Physician (Dermatology) Birder Robson, MD as Referring Physician (Ophthalmology) Verdia Kuba, [facility] (Inactive) (Pharmacist)   CHIEF COMPLAINT: CLL.  INTERVAL HISTORY: Patient returns to clinic today for repeat laboratory work and further evaluation.  She continues to tolerate Imbruvica well without significant side effects.  She does not complain of any weakness or fatigue.  She has no neurologic complaints. She denies any fevers, night sweats, or weight loss. She denies any chest pain, shortness of breath, cough, or hemoptysis.  She denies any nausea, vomiting, constipation, or diarrhea. She has no urinary complaints.  Patient offers no specific complaints today.  REVIEW OF SYSTEMS:   Review of Systems  Constitutional: Negative.  Negative for diaphoresis, fever, malaise/fatigue and weight loss.  Respiratory: Negative.  Negative for cough and shortness of breath.   Cardiovascular: Negative.  Negative for chest pain and leg swelling.  Gastrointestinal: Negative.  Negative for abdominal pain.  Genitourinary: Negative.  Negative for dysuria.  Musculoskeletal: Negative.  Negative for back pain.  Skin: Negative.  Negative for rash.  Neurological: Negative.  Negative for dizziness, sensory change, focal weakness, weakness and headaches.  Psychiatric/Behavioral: Positive for memory loss. Negative for depression. The patient is not nervous/anxious.     As per HPI. Otherwise, a complete review of systems is negative.  PAST MEDICAL HISTORY: Past Medical History:  Diagnosis Date  . Chronic lymphocytic leukemia ([place]) 2011   . Hyperlipidemia   . Hypertension     PAST SURGICAL HISTORY: Past Surgical History:  Procedure Laterality Date  . cataract surgery Bilateral 2005  . CHOLECYSTECTOMY  1991  . RECONSTRUCTION OF EYELID      FAMILY HISTORY Family History  Problem Relation Age of Onset  . Hypertension Mother   . Stroke Mother   . CAD Mother   . Hypertension Father   . Heart attack Father   . Hypertension Brother   . Seizures Brother   . Hypertension Son   . Breast cancer Neg Hx     GYNECOLOGIC HISTORY:  No LMP recorded. Patient is postmenopausal.     ADVANCED DIRECTIVES:    HEALTH MAINTENANCE: Social History   Tobacco Use  . Smoking status: Never Smoker  . Smokeless tobacco: Never Used  Vaping Use  . Vaping Use: Never used  Substance Use Topics  . Alcohol use: No  . Drug use: No     Colonoscopy:  PAP:  Bone density:  Lipid panel:  Allergies  Allergen Reactions  . Hctz [Hydrochlorothiazide]     Hyponatremia  . Penicillins Rash    Current Outpatient Medications  Medication Sig Dispense Refill  . amLODipine (NORVASC) 5 MG tablet TAKE 1 TABLET BY MOUTH EVERY DAY 90 tablet 1  . atorvastatin (LIPITOR) 40 MG tablet Take by mouth.    [name] COENZYME Q-10 PO Take 200 mg by mouth daily.     . DULoxetine (CYMBALTA) 20 MG capsule TAKE 1 CAPSULE BY MOUTH EVERY DAY 90 capsule 0  . furosemide (LASIX) 20 MG tablet Take by mouth.    . metoprolol succinate (TOPROL-XL) 100 MG 24 hr tablet Take 1 tablet (100 mg total) by mouth daily. Take with or immediately following a  meal. 90 tablet 1  . OLANZapine (ZYPREXA) 2.5 MG tablet TAKE 1 TABLET BY MOUTH AT BEDTIME. 30 tablet 2  . rivastigmine (EXELON) 1.5 MG capsule Take 1.5 mg by mouth 2 (two) times daily.    [name] telmisartan (MICARDIS) 40 MG tablet Take by mouth.    . ibrutinib (IMBRUVICA) 420 MG TABS Take 1 tablet by mouth daily. 28 tablet 5  . losartan (COZAAR) 100 MG tablet TAKE 1 TABLET BY MOUTH EVERY DAY (Patient not taking: Reported on  08/27/2020) 90 tablet 0  . vitamin B-12 (CYANOCOBALAMIN) 1000 MCG tablet Take 1,000 mcg by mouth daily. (Patient not taking: Reported on 08/27/2020)     No current facility-administered medications for this visit.    OBJECTIVE: BP 133/84 (BP Location: Left Arm)   Pulse 65   Temp 98.4 F (36.9 C) (Tympanic)   Wt 135 lb 8 oz (61.5 kg)   SpO2 97%   BMI 26.46 kg/m    Body mass index is 26.46 kg/m.    ECOG FS:0 - Asymptomatic   General: Well-developed, well-nourished, no acute distress. Eyes: Pink conjunctiva, anicteric sclera. HEENT: Normocephalic, moist mucous membranes. Lungs: No audible wheezing or coughing. Heart: Regular rate and rhythm. Abdomen: Soft, nontender, no obvious distention. Musculoskeletal: No edema, cyanosis, or clubbing. Neuro: Alert, answering all questions appropriately. Cranial nerves grossly intact. Skin: No rashes or petechiae noted. Psych: Normal affect.   LAB RESULTS:  Appointment on 08/27/2020  Component Date Value Ref Range Status  . Sodium 08/27/2020 140  135 - 145 mmol/L Final  . Potassium 08/27/2020 3.5  3.5 - 5.1 mmol/L Final  . Chloride 08/27/2020 98  98 - 111 mmol/L Final  . CO2 08/27/2020 28  22 - 32 mmol/L Final  . Glucose, Bld 08/27/2020 144* 70 - 99 mg/dL Final   Glucose reference range applies only to samples taken after fasting for at least 8 hours.  . BUN 08/27/2020 23  8 - 23 mg/dL Final  . Creatinine, Ser 08/27/2020 0.98  0.44 - 1.00 mg/dL Final  . Calcium 08/27/2020 8.5* 8.9 - 10.3 mg/dL Final  . Total Protein 08/27/2020 6.5  6.5 - 8.1 g/dL Final  . Albumin 08/27/2020 3.6  3.5 - 5.0 g/dL Final  . AST 08/27/2020 31  15 - 41 U/L Final  . ALT 08/27/2020 17  0 - 44 U/L Final  . Alkaline Phosphatase 08/27/2020 99  38 - 126 U/L Final  . Total Bilirubin 08/27/2020 0.8  0.3 - 1.2 mg/dL Final  . GFR, Estimated 08/27/2020 57* >60 mL/min Final   Comment: (NOTE) Calculated using the CKD-EPI Creatinine Equation (2021)   . Anion gap 08/27/2020  14  5 - 15 Final   Performed at [facility], [address] [place], [place] [address]  . WBC 08/27/2020 16.0* 4.0 - 10.5 K/uL Final  . RBC 08/27/2020 4.95  3.87 - 5.11 MIL/uL Final  . Hemoglobin 08/27/2020 12.5  12.0 - 15.0 g/dL Final  . HCT 08/27/2020 39.7  36.0 - 46.0 % Final  . MCV 08/27/2020 80.2  80.0 - 100.0 fL Final  . MCH 08/27/2020 25.3* 26.0 - 34.0 pg Final  . MCHC 08/27/2020 31.5  30.0 - 36.0 g/dL Final  . RDW 08/27/2020 16.0* 11.5 - 15.5 % Final  . Platelets 08/27/2020 375  150 - 400 K/uL Final  . nRBC 08/27/2020 0.0  0.0 - 0.2 % Final  . Neutrophils Relative % 08/27/2020 45  % Final  . Neutro Abs 08/27/2020 7.2  1.7 -  7.7 K/uL Final  . Lymphocytes Relative 08/27/2020 46  % Final  . Lymphs Abs 08/27/2020 7.4* 0.7 - 4.0 K/uL Final  . Monocytes Relative 08/27/2020 8  % Final  . Monocytes Absolute 08/27/2020 1.3* 0.1 - 1.0 K/uL Final  . Eosinophils Relative 08/27/2020 0  % Final  . Eosinophils Absolute 08/27/2020 0.0  0.0 - 0.5 K/uL Final  . Basophils Relative 08/27/2020 0  % Final  . Basophils Absolute 08/27/2020 0.0  0.0 - 0.1 K/uL Final  . WBC Morphology 08/27/2020 DIFF CONFIRMED   Final  . RBC Morphology 08/27/2020 UNREMARKABLE   Final  . Smear Review 08/27/2020 Normal platelet morphology   Final   PLATELETS APPEAR ADEQUATE  . Myelocytes 08/27/2020 1  % Final  . Abs Immature Granulocytes 08/27/2020 0.20* 0.00 - 0.07 K/uL Final   Performed at [facility], [address] [place], [place] [address]    STUDIES: No results found.  ASSESSMENT: CLL.  PLAN:    1.  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  CT scan results from April 05, 2020 revealed substantial improvement of patient's widespread lymphadenopathy.  Her white blood cell count continues to trend down and is now 16.0.  No intervention is needed at this time.  Patient initiated Imbruvica in May 2021.  Plan to continue  treatment until progression of disease or intolerable side effects.  Return to clinic in 3 months with repeat laboratory work, repeat imaging, and further evaluation.  Appreciate clinical pharmacy input.   2.  Anemia: Resolved.  I spent a total of 30 minutes reviewing chart data, face-to-face evaluation with the patient, counseling and coordination of care as detailed above.    Patient expressed understanding and was in agreement with this plan. She also understands that She can call clinic at any time with any questions, concerns, or complaints.    Lloyd Huger, MD   08/29/2020 6:15 AM

## 2020-08-27 ENCOUNTER — Inpatient Hospital Stay: Payer: PPO | Admitting: Oncology

## 2020-08-27 ENCOUNTER — Inpatient Hospital Stay: Payer: PPO | Admitting: Pharmacist

## 2020-08-27 ENCOUNTER — Inpatient Hospital Stay: Payer: PPO | Attending: Oncology

## 2020-08-27 ENCOUNTER — Encounter: Payer: Self-pay | Admitting: Oncology

## 2020-08-27 VITALS — BP 133/84 | HR 65 | Temp 98.4°F | Wt 135.5 lb

## 2020-08-27 DIAGNOSIS — C9110 Chronic lymphocytic leukemia of B-cell type not having achieved remission: Secondary | ICD-10-CM | POA: Insufficient documentation

## 2020-08-27 LAB — COMPREHENSIVE METABOLIC PANEL
ALT: 17 U/L (ref 0–44)
AST: 31 U/L (ref 15–41)
Albumin: 3.6 g/dL (ref 3.5–5.0)
Alkaline Phosphatase: 99 U/L (ref 38–126)
Anion gap: 14 (ref 5–15)
BUN: 23 mg/dL (ref 8–23)
CO2: 28 mmol/L (ref 22–32)
Calcium: 8.5 mg/dL — ABNORMAL LOW (ref 8.9–10.3)
Chloride: 98 mmol/L (ref 98–111)
Creatinine, Ser: 0.98 mg/dL (ref 0.44–1.00)
GFR, Estimated: 57 mL/min — ABNORMAL LOW (ref >60)
Glucose, Bld: 144 mg/dL — ABNORMAL HIGH (ref 70–99)
Potassium: 3.5 mmol/L (ref 3.5–5.1)
Sodium: 140 mmol/L (ref 135–145)
Total Bilirubin: 0.8 mg/dL (ref 0.3–1.2)
Total Protein: 6.5 g/dL (ref 6.5–8.1)

## 2020-08-27 LAB — CBC WITH DIFFERENTIAL/PLATELET
Abs Immature Granulocytes: 0.2 10*3/uL — ABNORMAL HIGH (ref 0.00–0.07)
Basophils Absolute: 0 10*3/uL (ref 0.0–0.1)
Basophils Relative: 0 %
Eosinophils Absolute: 0 10*3/uL (ref 0.0–0.5)
Eosinophils Relative: 0 %
HCT: 39.7 % (ref 36.0–46.0)
Hemoglobin: 12.5 g/dL (ref 12.0–15.0)
Lymphocytes Relative: 46 %
Lymphs Abs: 7.4 10*3/uL — ABNORMAL HIGH (ref 0.7–4.0)
MCH: 25.3 pg — ABNORMAL LOW (ref 26.0–34.0)
MCHC: 31.5 g/dL (ref 30.0–36.0)
MCV: 80.2 fL (ref 80.0–100.0)
Monocytes Absolute: 1.3 10*3/uL — ABNORMAL HIGH (ref 0.1–1.0)
Monocytes Relative: 8 %
Myelocytes: 1 %
Neutro Abs: 7.2 10*3/uL (ref 1.7–7.7)
Neutrophils Relative %: 45 %
Platelets: 375 10*3/uL (ref 150–400)
RBC: 4.95 MIL/uL (ref 3.87–5.11)
RDW: 16 % — ABNORMAL HIGH (ref 11.5–15.5)
Smear Review: NORMAL
WBC: 16 10*3/uL — ABNORMAL HIGH (ref 4.0–10.5)
nRBC: 0 % (ref 0.0–0.2)

## 2020-08-27 NOTE — Progress Notes (Signed)
[place]  [phone] [phone]  Patient Care Team: [name], MD as PCP - General (Family Medicine) Lloyd Huger, MD as Consulting Physician (Oncology) Corey Skains, MD as Consulting Physician (Cardiology) Brendolyn Patty, MD as Consulting Physician (Dermatology) Birder Robson, MD as Referring Physician (Ophthalmology) Verdia Kuba, [facility] (Inactive) (Pharmacist)   Name of the patient: Barbara Chambers  [id number]  [birth year]   Date of visit: 08/27/20  HPI: Patient is a 86 y.o. female with chronic lymphocytic leukemia.  Reason for Consult: Oral chemotherapy follow-up for Imbruvica (ibrutinib) therapy.   PAST MEDICAL HISTORY: Past Medical History:  Diagnosis Date   Chronic lymphocytic leukemia ([place]) 2011   Hyperlipidemia    Hypertension     PAST SURGICAL HISTORY:  Past Surgical History:  Procedure Laterality Date   cataract surgery Bilateral 2005   CHOLECYSTECTOMY  1991   RECONSTRUCTION OF EYELID      HEMATOLOGY/ONCOLOGY HISTORY:  Oncology History   No history exists.    ALLERGIES:  is allergic to hctz [hydrochlorothiazide] and penicillins.  MEDICATIONS:  Current Outpatient Medications  Medication Sig Dispense Refill   amLODipine (NORVASC) 5 MG tablet TAKE 1 TABLET BY MOUTH EVERY DAY 90 tablet 1   COENZYME Q-10 PO Take 200 mg by mouth daily.      DULoxetine (CYMBALTA) 20 MG capsule TAKE 1 CAPSULE BY MOUTH EVERY DAY 90 capsule 0   IMBRUVICA 420 MG TABS TAKE 1 TABLET BY MOUTH DAILY. 28 tablet 5   losartan (COZAAR) 100 MG tablet TAKE 1 TABLET BY MOUTH EVERY DAY 90 tablet 0   metoprolol succinate (TOPROL-XL) 100 MG 24 hr tablet Take 1 tablet (100 mg total) by mouth daily. Take with or immediately following a meal. 90 tablet 1   OLANZapine (ZYPREXA) 2.5 MG tablet TAKE 1 TABLET BY MOUTH AT BEDTIME. 30 tablet 2   rivastigmine (EXELON) 1.5 MG capsule Take 1.5 mg by  mouth 2 (two) times daily.     vitamin B-12 (CYANOCOBALAMIN) 1000 MCG tablet Take 1,000 mcg by mouth daily.     No current facility-administered medications for this visit.    VITAL SIGNS: There were no vitals taken for this visit. There were no vitals filed for this visit.  Estimated body mass index is 27.34 kg/m as calculated from the following:   Height as of 10/31/19: 5' (1.524 m).   Weight as of 05/27/20: 63.5 kg (140 lb).  LABS: CBC:    Component Value Date/Time   WBC 20.8 (H) 07/02/2020 1404   HGB 13.0 07/02/2020 1404   HGB 12.9 09/28/2019 0936   HCT 41.4 07/02/2020 1404   HCT 41.2 09/28/2019 0936   PLT 314 07/02/2020 1404   PLT 262 09/28/2019 0936   MCV 79.0 (L) 07/02/2020 1404   MCV 87 09/28/2019 0936   MCV 84 03/19/2014 1000   NEUTROABS 6.9 07/02/2020 1404   NEUTROABS 6.1 03/01/2019 1050   NEUTROABS 4.9 03/19/2014 1000   LYMPHSABS 12.4 (H) 07/02/2020 1404   LYMPHSABS 17.1 (H) 03/01/2019 1050   LYMPHSABS 8.1 (H) 03/19/2014 1000   MONOABS 1.1 (H) 07/02/2020 1404   MONOABS 0.6 03/19/2014 1000   EOSABS 0.1 07/02/2020 1404   EOSABS 0.0 03/01/2019 1050   EOSABS 0.1 03/19/2014 1000   BASOSABS 0.1 07/02/2020 1404   BASOSABS 0.0 03/01/2019 1050   BASOSABS 0.2 (H) 03/19/2014 1000   Comprehensive Metabolic Panel:    Component Value Date/Time   NA 137 07/02/2020 1404  NA 141 09/28/2019 0936   K 3.8 07/02/2020 1404   CL 96 (L) 07/02/2020 1404   CO2 30 07/02/2020 1404   BUN 20 07/02/2020 1404   BUN 16 09/28/2019 0936   CREATININE 0.95 07/02/2020 1404   CREATININE 0.78 04/20/2017 0914   GLUCOSE 109 (H) 07/02/2020 1404   CALCIUM 9.1 07/02/2020 1404   AST 26 07/02/2020 1404   AST 25 02/15/2012 1101   ALT 16 07/02/2020 1404   ALT 27 02/15/2012 1101   ALKPHOS 93 07/02/2020 1404   ALKPHOS 89 02/15/2012 1101   BILITOT 0.7 07/02/2020 1404   BILITOT 0.4 09/28/2019 0936   BILITOT 0.4 02/15/2012 1101   PROT 7.2 07/02/2020 1404   PROT 6.2 09/28/2019 0936   PROT 6.9  02/15/2012 1101   ALBUMIN 4.1 07/02/2020 1404   ALBUMIN 4.1 09/28/2019 0936   ALBUMIN 3.7 02/15/2012 1101    RADIOGRAPHIC STUDIES: No results found.   Assessment and Plan-  Reviewed CBC and CMP, plan to continue patient on 420mg  Imbruvica (ibrutinib). Overall Ms. Keener is feeling well.   Oral Chemotherapy Side Effect/Intolerance:  -Reviewed potential side effects with Ms. Drenning (diarrhea, edema, rash, nausea, fatigue) she did not endorse any side effects. At our last visit we discussed occassional incidence of major diarrhea, she reports no more issues with those types of events.  Oral Chemotherapy Adherence: no missed doses  Medication Access Issues:  no issues, [facility] set-up refill with an expected delivery of 3/3.  Patient expressed understanding and was in agreement with this plan. She also understands that She can call clinic at any time with any questions, concerns, or complaints.   Thank you for allowing me to participate in the care of this very pleasant patient.   Time Total: 10 mins  Visit consisted of counseling and education on dealing with issues of symptom management in the setting of serious and potentially life-threatening illness.Greater than 50%  of this time was spent counseling and coordinating care related to the above assessment and plan.  Signed by: Darl Pikes, PharmD, BCPS, Salley Slaughter, CPP Hematology/Oncology Clinical Pharmacist Practitioner [facility]/HP/AP [facility] [phone]  08/27/2020 9:11 AM

## 2020-08-27 NOTE — Progress Notes (Signed)
Patient here for oncology follow-up appointment, expresses no complaints or concerns at this time.    

## 2020-08-28 ENCOUNTER — Other Ambulatory Visit (HOSPITAL_COMMUNITY): Payer: Self-pay | Admitting: Oncology

## 2020-08-28 MED ORDER — IMBRUVICA 420 MG PO TABS: 1.0000 | ORAL_TABLET | Freq: Every day | ORAL | 5 refills | Status: DC

## 2020-08-28 MED FILL — IMBRUVICA 420 MG TAB: 420 | 28 days supply | Qty: 28 | Fill #4

## 2020-09-02 IMAGING — DX PORTABLE CHEST - 1 VIEW
1 series · 1 of 1 positions shown · non-contrast
Comparison: CT 06/18/2010.

CLINICAL DATA: High blood pressure.  History of lymphoma.

EXAM:
PORTABLE CHEST 1 VIEW

[chest ap]
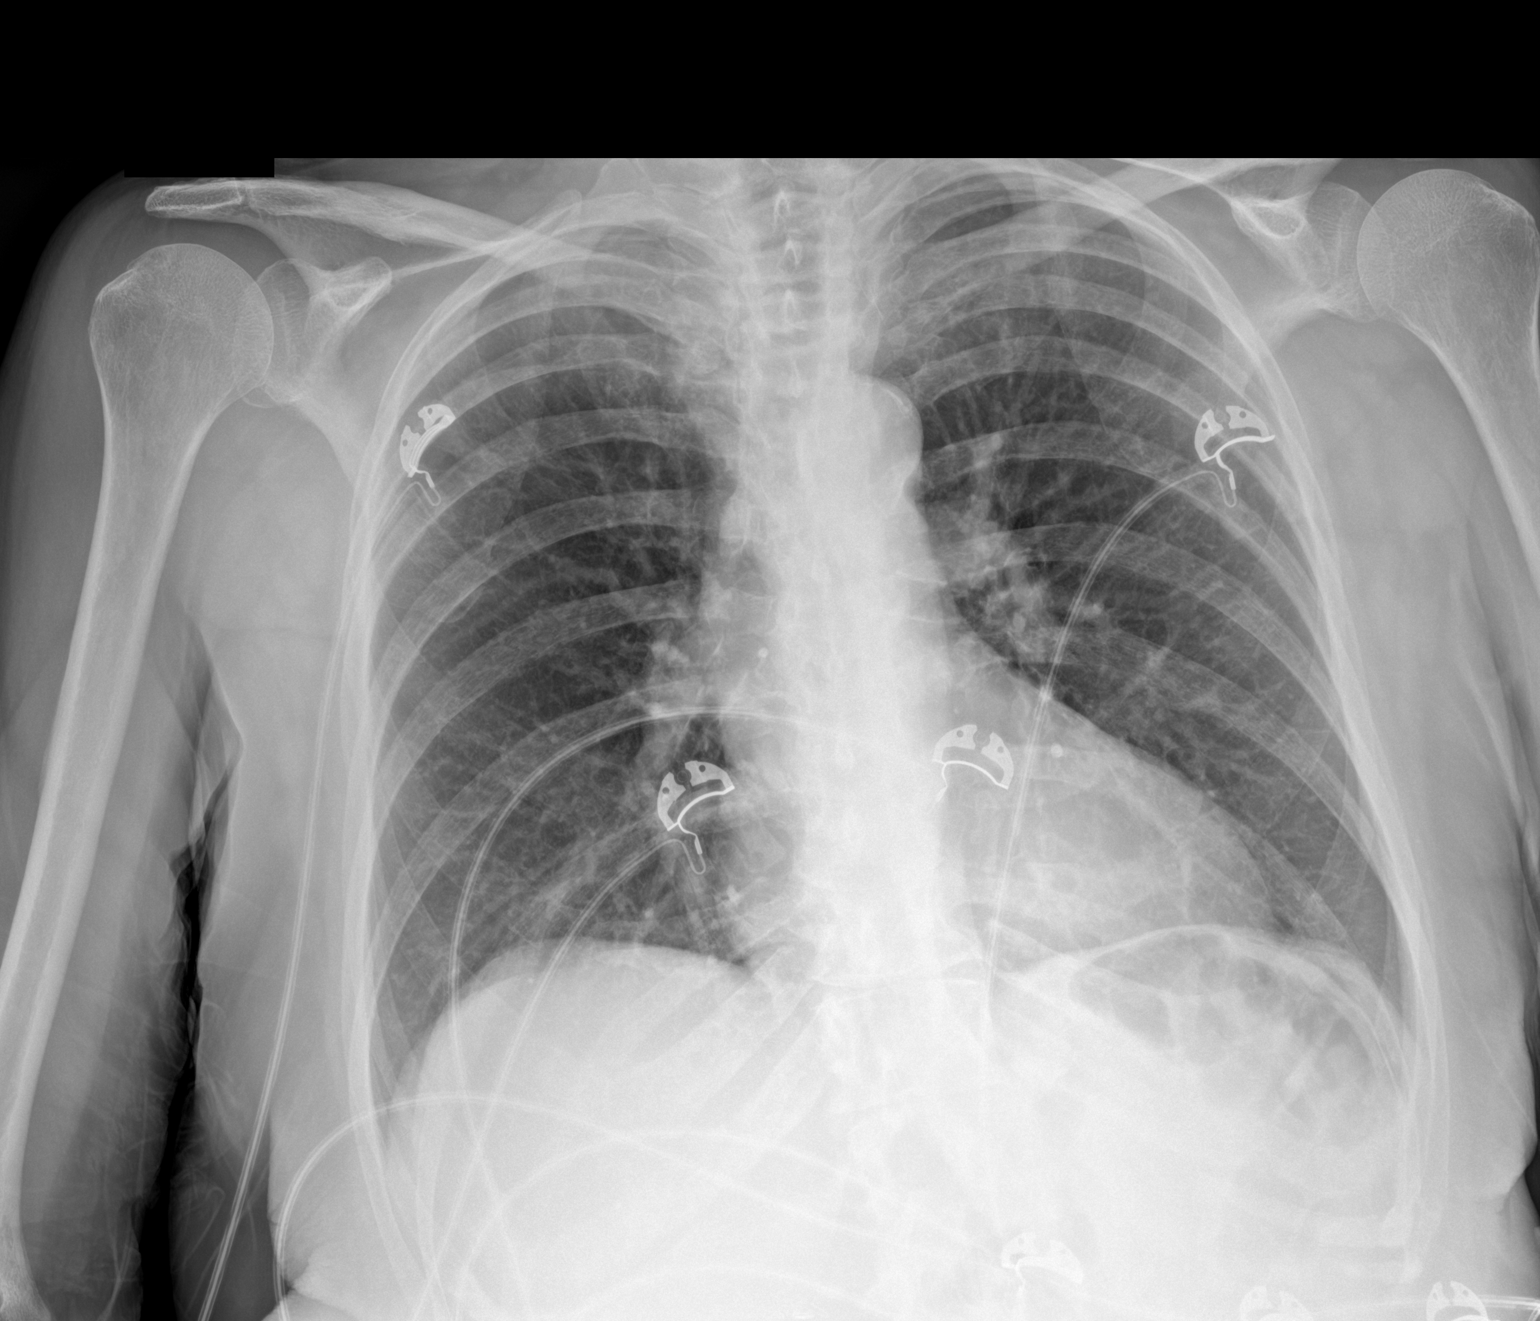

[1 of 1 positions shown; findings below may reference images not displayed]

FINDINGS: Mediastinum and hilar structures normal. Heart size stable. Low lung
volumes. No focal alveolar infiltrate. No pleural effusion or
pneumothorax. Degenerative change thoracic spine. Degenerative
changes both shoulders.
IMPRESSION: No acute cardiopulmonary disease.

## 2020-09-06 ENCOUNTER — Other Ambulatory Visit: Payer: Self-pay | Admitting: Family Medicine

## 2020-09-09 ENCOUNTER — Other Ambulatory Visit: Payer: Self-pay | Admitting: Family Medicine

## 2020-09-09 DIAGNOSIS — B89 Unspecified parasitic disease: Secondary | ICD-10-CM

## 2020-09-09 NOTE — Telephone Encounter (Signed)
Requested medication (s) are due for refill today:   Provider to review  Requested medication (s) are on the active medication list:   Yes  Future visit scheduled:   No   Last ordered: 08/25/2020 #30, 2 refills  Non delegated refill   Requested Prescriptions  Pending Prescriptions Disp Refills   OLANZapine (ZYPREXA) 2.5 MG tablet [Pharmacy Med Name: OLANZAPINE 2.5 MG TABLET] 90 tablet 1    Sig: TAKE 1 TABLET BY MOUTH EVERYDAY AT BEDTIME      Not Delegated - Psychiatry:  Antipsychotics - Second Generation (Atypical) - olanzapine Failed - 09/09/2020 12:31 PM      Failed - This refill cannot be delegated      Passed - ALT in normal range and within 360 days    ALT  Date Value Ref Range Status  08/27/2020 17 0 - 44 U/L Final   SGPT (ALT)  Date Value Ref Range Status  02/15/2012 27 12 - 78 U/L Final          Passed - AST in normal range and within 360 days    AST  Date Value Ref Range Status  08/27/2020 31 15 - 41 U/L Final   SGOT(AST)  Date Value Ref Range Status  02/15/2012 25 15 - 37 Unit/L Final          Passed - Last BP in normal range    BP Readings from Last 1 Encounters:  08/27/20 133/84          Passed - Valid encounter within last 6 months    Recent Outpatient Visits           3 months ago Benign essential HTN   [place], [name], MD   6 months ago Benign essential HTN   [place], [name], MD   11 months ago Encounter for annual physical exam   [facility] [place], [name], MD   1 year ago Rosacea   [facility] [place], [name], MD   1 year ago Dry skin dermatitis   [facility] [place], [name], MD

## 2020-09-09 NOTE — Telephone Encounter (Signed)
LOV 05/27/2020

## 2020-09-24 ENCOUNTER — Other Ambulatory Visit (HOSPITAL_COMMUNITY): Payer: Self-pay

## 2020-09-24 MED FILL — IMBRUVICA 420 MG TAB: 420 | 28 days supply | Qty: 28 | Fill #5

## 2020-10-02 NOTE — Progress Notes (Signed)
Subjective:   Barbara Chambers is a 86 y.o. female who presents for Medicare Annual (Subsequent) preventive examination.  I connected with Barbara Chambers today by telephone and verified that I am speaking with the correct person using two identifiers. Location patient: home Location provider: work Persons participating in the virtual visit: patient, provider.   I discussed the limitations, risks, security and privacy concerns of performing an evaluation and management service by telephone and the availability of in person appointments. I also discussed with the patient that there may be a patient responsible charge related to this service. The patient expressed understanding and verbally consented to this telephonic visit.    Interactive audio and video telecommunications were attempted between this provider and patient, however failed, due to patient having technical difficulties OR patient did not have access to video capability.  We continued and completed visit with audio only.  Review of Systems    N/A  Cardiac Risk Factors include: advanced age (>61men, >66 women);dyslipidemia;hypertension     Objective:    There were no vitals filed for this visit. There is no height or weight on file to calculate BMI.  Advanced Directives 10/03/2020 05/20/2020 04/09/2020 02/08/2020 12/13/2019 11/17/2019 10/31/2019  Does Patient Have a Medical Advance Directive? Yes Yes Yes No Yes Yes Yes  Type of Paramedic of [place];Living will [place];Living will [place];Living will - [place];Living will - -  Does patient want to make changes to medical advance directive? - Yes (MAU/Ambulatory/Procedural Areas - Information given) Yes (MAU/Ambulatory/Procedural Areas - Information given) No - Patient declined No - Patient declined No - Patient declined No - Patient declined  Copy of [place] in Chart? No - copy  requested - - - No - copy requested - -  Would patient like information on creating a medical advance directive? - - - - No - Patient declined No - Patient declined No - Patient declined    Current Medications (verified) Outpatient Encounter Medications as of 10/03/2020  Medication Sig  . amLODipine (NORVASC) 5 MG tablet TAKE 1 TABLET BY MOUTH EVERY DAY  . atorvastatin (LIPITOR) 40 MG tablet Take 40 mg by mouth daily.  [name] COENZYME Q-10 PO Take 200 mg by mouth daily.   . DULoxetine (CYMBALTA) 20 MG capsule TAKE 1 CAPSULE BY MOUTH EVERY DAY  . furosemide (LASIX) 20 MG tablet Take 20 mg by mouth daily.  [name] ibrutinib (IMBRUVICA) 420 MG TABS Take 1 tablet by mouth daily.  . metoprolol succinate (TOPROL-XL) 100 MG 24 hr tablet Take 1 tablet (100 mg total) by mouth daily. Take with or immediately following a meal.  . OLANZapine (ZYPREXA) 2.5 MG tablet TAKE 1 TABLET BY MOUTH EVERYDAY AT BEDTIME  . rivastigmine (EXELON) 1.5 MG capsule Take 1.5 mg by mouth 2 (two) times daily.  [name] telmisartan (MICARDIS) 40 MG tablet Take 40 mg by mouth daily.  [name] losartan (COZAAR) 100 MG tablet TAKE 1 TABLET BY MOUTH EVERY DAY (Patient not taking: No sig reported)  . vitamin B-12 (CYANOCOBALAMIN) 1000 MCG tablet Take 1,000 mcg by mouth daily. (Patient not taking: No sig reported)   No facility-administered encounter medications on file as of 10/03/2020.    Allergies (verified) Hctz [hydrochlorothiazide] and Penicillins   History: Past Medical History:  Diagnosis Date  . Chronic lymphocytic leukemia ([place]) 2011  . Hyperlipidemia   . Hypertension    Past Surgical History:  Procedure Laterality Date  .  cataract surgery Bilateral 2005  . CHOLECYSTECTOMY  1991  . RECONSTRUCTION OF EYELID     Family History  Problem Relation Age of Onset  . Hypertension Mother   . Stroke Mother   . CAD Mother   . Hypertension Father   . Heart attack Father   . Hypertension Brother   . Seizures Brother   . Hypertension Son   .  Breast cancer Neg Hx    Social History   Socioeconomic History  . Marital status: Widowed    Spouse name: Casandra Doffing  . Number of children: 3  . Years of education: Not on file  . Highest education level: 12th grade  Occupational History  . Occupation: retired  Tobacco Use  . Smoking status: Never Smoker  . Smokeless tobacco: Never Used  Vaping Use  . Vaping Use: Never used  Substance and Sexual Activity  . Alcohol use: No  . Drug use: No  . Sexual activity: Not on file  Other Topics Concern  . Not on file  Social History Narrative   Pt has 1 son that was killed in a car accident at age 22.    Son [name] is primary caregiver   Social Determinants of Health   Financial Resource Strain: Low Risk   . Difficulty of Paying Living Expenses: Not hard at all  Food Insecurity: No Food Insecurity  . Worried About Charity fundraiser in the Last Year: Never true  . Ran Out of Food in the Last Year: Never true  Transportation Needs: No Transportation Needs  . Lack of Transportation (Medical): No  . Lack of Transportation (Non-Medical): No  Physical Activity: Inactive  . Days of Exercise per Week: 0 days  . Minutes of Exercise per Session: 0 min  Stress: No Stress Concern Present  . Feeling of Stress : Not at all  Social Connections: Moderately Integrated  . Frequency of Communication with Friends and Family: More than three times a week  . Frequency of Social Gatherings with Friends and Family: More than three times a week  . Attends Religious Services: More than 4 times per year  . Active Member of Clubs or Organizations: Yes  . Attends Archivist Meetings: More than 4 times per year  . Marital Status: Widowed    Tobacco Counseling Counseling given: Not Answered   Clinical Intake:  Pre-visit preparation completed: Yes  Pain : No/denies pain     Nutritional Risks: None Diabetes: No  How often do you need to have someone help you when you read instructions,  pamphlets, or other written materials from your doctor or pharmacy?: 1 - Never  Diabetic? No  Interpreter Needed?: No  Information entered by :: [facility], LPN   Activities of Daily Living In your present state of health, do you have any difficulty performing the following activities: 10/03/2020 05/27/2020  Hearing? N N  Vision? N N  Difficulty concentrating or making decisions? N N  Walking or climbing stairs? N N  Dressing or bathing? N N  Doing errands, shopping? N N  Preparing Food and eating ? N -  Using the Toilet? N -  In the past six months, have you accidently leaked urine? Y -  Comment Occasionally with urges. -  Do you have problems with loss of bowel control? N -  Managing your Medications? N -  Managing your Finances? N -  Housekeeping or managing your Housekeeping? N -  Some recent data might be hidden  Patient Care Team: [name], MD as PCP - General (Family Medicine) Lloyd Huger, MD as Consulting Physician (Oncology) Corey Skains, MD as Consulting Physician (Cardiology) Birder Robson, MD as Referring Physician (Ophthalmology)  Indicate any recent Medical Services you may have received from other than [facility] providers in the past year (date may be approximate).     Assessment:   This is a routine wellness examination for Adaira.  Hearing/Vision screen No exam data present  Dietary issues and exercise activities discussed: Current Exercise Habits: Home exercise routine, Type of exercise: walking, Time (Minutes): 15, Frequency (Times/Week): 7, Weekly Exercise (Minutes/Week): 105, Intensity: Mild, Exercise limited by: None identified  Goals      Patient Stated   .  "I need to make sure mom follows up with a psychiatrist" (pt-stated)      Current Barriers:  [name] Mental Health Concerns   Clinical Social Work Clinical Goal(s):  [name] Over the next 30 days, client will follow up with [facility] as directed by patient's  provider to address continued delusion that bed bugs remain in her home  Interventions: . [name] who is on DPR  interviewed and appropriate assessments performed . Followed up on  referral to [facility] to address delusional disorder . Confirmed that patient appointment scheduled for 03/28/19 with [name] ANP-C for medication assessment was kept . Patient's EMR confirmed that patient does not have a UTI and she can now stop the antibiotics . Encouraged patient's son to continue to support patient with ongoing mental health follow up with [facility] for medication management. . [name] provided  with direct contact information for this social worker in the event that any community resource needs arise   Patient Self Care Activities:  . Attends all scheduled provider appointments . Performs ADL's independently . Unable to perform IADLs independently  Please see past updates related to this goal by clicking on the "Past Updates" button in the selected goal        Other   .  Chronic Care Management      CARE PLAN ENTRY  Current Barriers:  . Chronic Disease Management support, education, and care coordination needs related to hyperlipidemia, hypertension, and cognition .   Hypertension . Pharmacist Clinical Goal(s): o Over the next 90 days, patient will work with PharmD and providers to achieve BP goal <130/80 . Current regimen:  o Amlodipine 5 mg daily, losartan 100mg  daily, Toprol XL 50mg  daily o All taken in the morning . Interventions: o Counseled that med adjustment difficult due to diastolic 56 - 63 o Take metoprolol succinate 50mg  at bedtime . Patient self care activities - Over the next 90 days, patient will: o Check BP daily, document, and provide at future appointments o Ensure daily salt intake < 2300 mg/day  Hyperlipidemia . Pharmacist Clinical Goal(s): o Over the next 90 days, patient will work with PharmD and providers to  achieve LDL goal < 70 o Per neurology, low LDL important for memory . Current regimen:  o Simvastatin 10mg  at bedtime . Interventions: o Stop simvastatin o Start atorvastatin 40mg  1 tab by mouth daily o Follow up lab work at 6 weeks . Patient self care activities - Over the next 90 days, patient will: o Report any unexplained muscle pain to MD or PharmD o Continue atorvastatin 40mg  daily  Cognition . Pharmacist Clinical Goal(s) o Over the next 90 days, patient will work with PharmD and providers to reduce unwanted thoughts .  Current regimen:  o Zyprexa 2.5mg  with snack at bedtime o Exelon 1.5mg  twice daily . Interventions: o None . Patient self care activities - Over the next 90 days, patient will: o Continue taking Zyprexa with a light snack at night  Medication management . Pharmacist Clinical Goal(s): o Over the next 90 days, patient will work with PharmD and providers to maintain optimal medication adherence . Current pharmacy: [facility] . Interventions o Comprehensive medication review performed. o Continue current medication management strategy . Patient self care activities - Over the next 90 days, patient will: o Focus on medication adherence by working with son o Take medications as prescribed o Report any questions or concerns to PharmD and/or provider(s)  Initial goal documentation     .  Exercise      Starting 06/24/16, I will start back walking 4 days a week for 45 minutes.    [name]  LIFESTYLE - DECREASE FALLS RISK      Recommend to drink at least 6-8 8oz glasses of water per day.     .  Prevent falls      Recommend to remove any items from the home that may cause slips or trips.      Depression Screen PHQ 2/9 Scores 10/03/2020 05/27/2020 09/01/2019 07/13/2019 05/11/2019 07/11/2018 07/08/2017  PHQ - 2 Score 0 0 0 0 0 0 0  PHQ- 9 Score - 1 2 - 0 - 0    Fall Risk Fall Risk  10/03/2020 05/27/2020 07/13/2019 07/11/2018 07/08/2017  Falls in the past year? 1 1 1 1  No  Number  falls in past yr: 0 0 0 0 -  Comment - - - - -  Injury with Fall? 0 0 0 0 -  Comment - - - - -  Risk for fall due to : - No Fall Risks - - -  Follow up Falls prevention discussed Falls evaluation completed Falls prevention discussed Falls prevention discussed -    FALL RISK PREVENTION PERTAINING TO THE HOME:  Any stairs in or around the home? Yes  If so, are there any without handrails? No  Home free of loose throw rugs in walkways, pet beds, electrical cords, etc? Yes  Adequate lighting in your home to reduce risk of falls? Yes   ASSISTIVE DEVICES UTILIZED TO PREVENT FALLS:  Life alert? No  Use of a cane, walker or w/c? No  Grab bars in the bathroom? Yes Shower chair or bench in shower? No  Elevated toilet seat or a handicapped toilet? No    Cognitive Function: Normal cognitive status assessed by observation by this Nurse Health Advisor. No abnormalities found.       6CIT Screen 01/20/2019 06/24/2016  What Year? 0 points 0 points  What month? 0 points 0 points  What time? 0 points 0 points  Count back from 20 0 points 0 points  Months in reverse 0 points 0 points  Repeat phrase 0 points 2 points  Total Score 0 2    Immunizations Immunization History  Administered Date(s) Administered  . Fluad Quad(high Dose 65+) 03/17/2019, 03/07/2020  . Influenza, High Dose Seasonal PF 06/24/2016, 04/20/2017, 05/04/2018  . PFIZER(Purple Top)SARS-COV-2 Vaccination 08/10/2019, 09/02/2019, 03/07/2020  . Pneumococcal Conjugate-13 03/30/2014  . Pneumococcal Polysaccharide-23 04/20/2017  . Td 04/05/2017  . Tdap 07/14/2005    TDAP status: Up to date  Flu Vaccine status: Up to date  Pneumococcal vaccine status: Up to date  Covid-19 vaccine status: Completed vaccines  Qualifies for Shingles Vaccine? Yes  Zostavax completed No   Shingrix Completed?: No.    Education has been provided regarding the importance of this vaccine. Patient has been advised to call insurance company to  determine out of pocket expense if they have not yet received this vaccine. Advised may also receive vaccine at local pharmacy or Health Dept. Verbalized acceptance and understanding.  Screening Tests Health Maintenance  Topic Date Due  . COVID-19 Vaccine (4 - Booster for Pfizer series) 09/04/2020  . INFLUENZA VACCINE  01/27/2021  . TETANUS/TDAP  04/06/2027  . DEXA SCAN  Completed  . PNA vac Low Risk Adult  Completed  . HPV VACCINES  Aged Out    Health Maintenance  Health Maintenance Due  Topic Date Due  . COVID-19 Vaccine (4 - Booster for Pfizer series) 09/04/2020    Colorectal cancer screening: No longer required.   Mammogram status: No longer required due to age.  Bone Density status: Completed 04/13/14. Previous DEXA scan was normal. No repeat needed unless advised by a physician.  Lung Cancer Screening: (Low Dose CT Chest recommended if Age 78-80 years, 30 pack-year currently smoking OR have quit w/in 15years.) does not qualify.   Additional Screening:   Vision Screening: Recommended annual ophthalmology exams for early detection of glaucoma and other disorders of the eye. Is the patient up to date with their annual eye exam?  Yes  Who is the provider or what is the name of the office in which the patient attends annual eye exams? Dr George Ina @ [place] If pt is not established with a provider, would they like to be referred to a provider to establish care? No .   Dental Screening: Recommended annual dental exams for proper oral hygiene  Community Resource Referral / Chronic Care Management: CRR required this visit?  No   CCM required this visit?  No      Plan:     I have personally reviewed and noted the following in the patient's chart:   . Medical and social history . Use of alcohol, tobacco or illicit drugs  . Current medications and supplements . Functional ability and status . Nutritional status . Physical activity . Advanced directives . List of other  physicians . Hospitalizations, surgeries, and ER visits in previous 12 months . Vitals . Screenings to include cognitive, depression, and falls . Referrals and appointments  In addition, I have reviewed and discussed with patient certain preventive protocols, quality metrics, and best practice recommendations. A written personalized care plan for preventive services as well as general preventive health recommendations were provided to patient.     Yaresly Menzel [place], [place]   07/04/1094   Nurse Notes: None.

## 2020-10-03 ENCOUNTER — Other Ambulatory Visit: Payer: Self-pay

## 2020-10-03 ENCOUNTER — Encounter: Payer: PPO | Admitting: Family Medicine

## 2020-10-03 ENCOUNTER — Ambulatory Visit (INDEPENDENT_AMBULATORY_CARE_PROVIDER_SITE_OTHER): Payer: PPO

## 2020-10-03 DIAGNOSIS — Z0000 Encounter for general adult medical examination without abnormal findings: Secondary | ICD-10-CM

## 2020-10-03 NOTE — Patient Instructions (Signed)
Barbara Chambers , Thank you for taking time to come for your Medicare Wellness Visit. I appreciate your ongoing commitment to your health goals. Please review the following plan we discussed and let me know if I can assist you in the future.   Screening recommendations/referrals: Colonoscopy: No longer required.  Mammogram: No longer required.  Bone Density: Previous DEXA scan was normal. No repeat needed unless advised by a physician. Recommended yearly ophthalmology/optometry visit for glaucoma screening and checkup Recommended yearly dental visit for hygiene and checkup  Vaccinations: Influenza vaccine: Done 03/07/20 Pneumococcal vaccine: Completed series Tdap vaccine: Up to date, due 03/2027 Shingles vaccine: Shingrix discussed. Please contact your pharmacy for coverage information.     Advanced directives: Please bring a copy of your POA (Power of Attorney) and/or Living Will to your next appointment.   Conditions/risks identified: Fall risk preventatives discussed today. Recommend to increase water intake to 6-8 8 oz glasses a day.   Next appointment: 11/19/20 @ 1:40 PM with Dr [name]   Preventive Care 51 Years and Older, Female Preventive care refers to lifestyle choices and visits with your health care provider that can promote health and wellness. What does preventive care include?  A yearly physical exam. This is also called an annual well check.  Dental exams once or twice a year.  Routine eye exams. Ask your health care provider how often you should have your eyes checked.  Personal lifestyle choices, including:  Daily care of your teeth and gums.  Regular physical activity.  Eating a healthy diet.  Avoiding tobacco and drug use.  Limiting alcohol use.  Practicing safe sex.  Taking low-dose aspirin every day.  Taking vitamin and mineral supplements as recommended by your health care provider. What happens during an annual well check? The services and  screenings done by your health care provider during your annual well check will depend on your age, overall health, lifestyle risk factors, and family history of disease. Counseling  Your health care provider may ask you questions about your:  Alcohol use.  Tobacco use.  Drug use.  Emotional well-being.  Home and relationship well-being.  Sexual activity.  Eating habits.  History of falls.  Memory and ability to understand (cognition).  Work and work Statistician.  Reproductive health. Screening  You may have the following tests or measurements:  Height, weight, and BMI.  Blood pressure.  Lipid and cholesterol levels. These may be checked every 5 years, or more frequently if you are over 1 years old.  Skin check.  Lung cancer screening. You may have this screening every year starting at age 7 if you have a 30-pack-year history of smoking and currently smoke or have quit within the past 15 years.  Fecal occult blood test (FOBT) of the stool. You may have this test every year starting at age 42.  Flexible sigmoidoscopy or colonoscopy. You may have a sigmoidoscopy every 5 years or a colonoscopy every 10 years starting at age 46.  Hepatitis C blood test.  Hepatitis B blood test.  Sexually transmitted disease (STD) testing.  Diabetes screening. This is done by checking your blood sugar (glucose) after you have not eaten for a while (fasting). You may have this done every 1-3 years.  Bone density scan. This is done to screen for osteoporosis. You may have this done starting at age 55.  Mammogram. This may be done every 1-2 years. Talk to your health care provider about how often you should have regular mammograms. Talk with  your health care provider about your test results, treatment options, and if necessary, the need for more tests. Vaccines  Your health care provider may recommend certain vaccines, such as:  Influenza vaccine. This is recommended every  year.  Tetanus, diphtheria, and acellular pertussis (Tdap, Td) vaccine. You may need a Td booster every 10 years.  Zoster vaccine. You may need this after age 4.  Pneumococcal 13-valent conjugate (PCV13) vaccine. One dose is recommended after age 94.  Pneumococcal polysaccharide (PPSV23) vaccine. One dose is recommended after age 1. Talk to your health care provider about which screenings and vaccines you need and how often you need them. This information is not intended to replace advice given to you by your health care provider. Make sure you discuss any questions you have with your health care provider. Document Released: 07/12/2015 Document Revised: 03/04/2016 Document Reviewed: 04/16/2015 Elsevier Interactive Patient Education  2017 [place] Prevention in the Home Falls can cause injuries. They can happen to people of all ages. There are many things you can do to make your home safe and to help prevent falls. What can I do on the outside of my home?  Regularly fix the edges of walkways and driveways and fix any cracks.  Remove anything that might make you trip as you walk through a door, such as a raised step or threshold.  Trim any bushes or trees on the path to your home.  Use bright outdoor lighting.  Clear any walking paths of anything that might make someone trip, such as rocks or tools.  Regularly check to see if handrails are loose or broken. Make sure that both sides of any steps have handrails.  Any raised decks and porches should have guardrails on the edges.  Have any leaves, snow, or ice cleared regularly.  Use sand or salt on walking paths during winter.  Clean up any spills in your garage right away. This includes oil or grease spills. What can I do in the bathroom?  Use night lights.  Install grab bars by the toilet and in the tub and shower. Do not use towel bars as grab bars.  Use non-skid mats or decals in the tub or shower.  If you  need to sit down in the shower, use a plastic, non-slip stool.  Keep the floor dry. Clean up any water that spills on the floor as soon as it happens.  Remove soap buildup in the tub or shower regularly.  Attach bath mats securely with double-sided non-slip rug tape.  Do not have throw rugs and other things on the floor that can make you trip. What can I do in the bedroom?  Use night lights.  Make sure that you have a light by your bed that is easy to reach.  Do not use any sheets or blankets that are too big for your bed. They should not hang down onto the floor.  Have a firm chair that has side arms. You can use this for support while you get dressed.  Do not have throw rugs and other things on the floor that can make you trip. What can I do in the kitchen?  Clean up any spills right away.  Avoid walking on wet floors.  Keep items that you use a lot in easy-to-reach places.  If you need to reach something above you, use a strong step stool that has a grab bar.  Keep electrical cords out of the way.  Do not  use floor polish or wax that makes floors slippery. If you must use wax, use non-skid floor wax.  Do not have throw rugs and other things on the floor that can make you trip. What can I do with my stairs?  Do not leave any items on the stairs.  Make sure that there are handrails on both sides of the stairs and use them. Fix handrails that are broken or loose. Make sure that handrails are as long as the stairways.  Check any carpeting to make sure that it is firmly attached to the stairs. Fix any carpet that is loose or worn.  Avoid having throw rugs at the top or bottom of the stairs. If you do have throw rugs, attach them to the floor with carpet tape.  Make sure that you have a light switch at the top of the stairs and the bottom of the stairs. If you do not have them, ask someone to add them for you. What else can I do to help prevent falls?  Wear shoes  that:  Do not have high heels.  Have rubber bottoms.  Are comfortable and fit you well.  Are closed at the toe. Do not wear sandals.  If you use a stepladder:  Make sure that it is fully opened. Do not climb a closed stepladder.  Make sure that both sides of the stepladder are locked into place.  Ask someone to hold it for you, if possible.  Clearly mark and make sure that you can see:  Any grab bars or handrails.  First and last steps.  Where the edge of each step is.  Use tools that help you move around (mobility aids) if they are needed. These include:  Canes.  Walkers.  Scooters.  Crutches.  Turn on the lights when you go into a dark area. Replace any light bulbs as soon as they burn out.  Set up your furniture so you have a clear path. Avoid moving your furniture around.  If any of your floors are uneven, fix them.  If there are any pets around you, be aware of where they are.  Review your medicines with your doctor. Some medicines can make you feel dizzy. This can increase your chance of falling. Ask your doctor what other things that you can do to help prevent falls. This information is not intended to replace advice given to you by your health care provider. Make sure you discuss any questions you have with your health care provider. Document Released: 04/11/2009 Document Revised: 11/21/2015 Document Reviewed: 07/20/2014 Elsevier Interactive Patient Education  2017 [employer].

## 2020-10-17 ENCOUNTER — Other Ambulatory Visit (HOSPITAL_COMMUNITY): Payer: Self-pay

## 2020-10-17 MED FILL — Ibrutinib Tab 420 MG: ORAL | 28 days supply | Qty: 28 | Fill #0 | Status: AC

## 2020-11-13 DIAGNOSIS — C9110 Chronic lymphocytic leukemia of B-cell type not having achieved remission: Secondary | ICD-10-CM | POA: Diagnosis not present

## 2020-11-13 DIAGNOSIS — R410 Disorientation, unspecified: Secondary | ICD-10-CM | POA: Diagnosis not present

## 2020-11-13 DIAGNOSIS — R413 Other amnesia: Secondary | ICD-10-CM | POA: Diagnosis not present

## 2020-11-14 ENCOUNTER — Other Ambulatory Visit (HOSPITAL_COMMUNITY): Payer: Self-pay

## 2020-11-18 ENCOUNTER — Other Ambulatory Visit (HOSPITAL_COMMUNITY): Payer: Self-pay

## 2020-11-18 MED FILL — Ibrutinib Tab 420 MG: ORAL | 28 days supply | Qty: 28 | Fill #1 | Status: AC

## 2020-11-19 ENCOUNTER — Encounter: Payer: Self-pay | Admitting: Family Medicine

## 2020-11-19 ENCOUNTER — Other Ambulatory Visit: Payer: Self-pay

## 2020-11-19 ENCOUNTER — Ambulatory Visit (INDEPENDENT_AMBULATORY_CARE_PROVIDER_SITE_OTHER): Payer: PPO | Admitting: Family Medicine

## 2020-11-19 VITALS — BP 130/74 | HR 61 | Temp 97.9°F | Resp 16 | Ht 59.0 in | Wt 135.0 lb

## 2020-11-19 DIAGNOSIS — Z23 Encounter for immunization: Secondary | ICD-10-CM | POA: Diagnosis not present

## 2020-11-19 DIAGNOSIS — R600 Localized edema: Secondary | ICD-10-CM | POA: Diagnosis not present

## 2020-11-19 DIAGNOSIS — I10 Essential (primary) hypertension: Secondary | ICD-10-CM | POA: Diagnosis not present

## 2020-11-19 DIAGNOSIS — Z0000 Encounter for general adult medical examination without abnormal findings: Secondary | ICD-10-CM | POA: Diagnosis not present

## 2020-11-19 DIAGNOSIS — R413 Other amnesia: Secondary | ICD-10-CM

## 2020-11-19 DIAGNOSIS — E782 Mixed hyperlipidemia: Secondary | ICD-10-CM | POA: Diagnosis not present

## 2020-11-19 DIAGNOSIS — R443 Hallucinations, unspecified: Secondary | ICD-10-CM | POA: Diagnosis not present

## 2020-11-19 DIAGNOSIS — D692 Other nonthrombocytopenic purpura: Secondary | ICD-10-CM

## 2020-11-19 DIAGNOSIS — C9110 Chronic lymphocytic leukemia of B-cell type not having achieved remission: Secondary | ICD-10-CM

## 2020-11-19 DIAGNOSIS — I700 Atherosclerosis of aorta: Secondary | ICD-10-CM

## 2020-11-19 MED ORDER — COVID-19 MRNA VAC-TRIS(PFIZER) 30 MCG/0.3ML IM SUSP: 0.3000 mL | Freq: Once | INTRAMUSCULAR | 0 refills | Status: DC

## 2020-11-19 NOTE — Patient Instructions (Signed)
Try eucerin for itching   Preventive Care 84 Years and Older, Female Preventive care refers to lifestyle choices and visits with your health care provider that can promote health and wellness. This includes:  A yearly physical exam. This is also called an annual wellness visit.  Regular dental and eye exams.  Immunizations.  Screening for certain conditions.  Healthy lifestyle choices, such as: ? Eating a healthy diet. ? Getting regular exercise. ? Not using drugs or products that contain nicotine and tobacco. ? Limiting alcohol use. What can I expect for my preventive care visit? Physical exam Your health care provider will check your:  Height and weight. These may be used to calculate your BMI (body mass index). BMI is a measurement that tells if you are at a healthy weight.  Heart rate and blood pressure.  Body temperature.  Skin for abnormal spots. Counseling Your health care provider may ask you questions about your:  Past medical problems.  Family's medical history.  Alcohol, tobacco, and drug use.  Emotional well-being.  Home life and relationship well-being.  Sexual activity.  Diet, exercise, and sleep habits.  History of falls.  Memory and ability to understand (cognition).  Work and work Statistician.  Pregnancy and menstrual history.  Access to firearms. What immunizations do I need? Vaccines are usually given at various ages, according to a schedule. Your health care provider will recommend vaccines for you based on your age, medical history, and lifestyle or other factors, such as travel or where you work.   What tests do I need? Blood tests  Lipid and cholesterol levels. These may be checked every 5 years, or more often depending on your overall health.  Hepatitis C test.  Hepatitis B test. Screening  Lung cancer screening. You may have this screening every year starting at age 84 if you have a 30-pack-year history of smoking and  currently smoke or have quit within the past 15 years.  Colorectal cancer screening. ? All adults should have this screening starting at age 84 and continuing until age 80. ? Your health care provider may recommend screening at age 82 if you are at increased risk. ? You will have tests every 1-10 years, depending on your results and the type of screening test.  Diabetes screening. ? This is done by checking your blood sugar (glucose) after you have not eaten for a while (fasting). ? You may have this done every 1-3 years.  Mammogram. ? This may be done every 84-2 years. ? Talk with your health care provider about how often you should have regular mammograms.  Abdominal aortic aneurysm (AAA) screening. You may need this if you are a current or former smoker.  BRCA-related cancer screening. This may be done if you have a family history of breast, ovarian, tubal, or peritoneal cancers. Other tests  STD (sexually transmitted disease) testing, if you are at risk.  Bone density scan. This is done to screen for osteoporosis. You may have this done starting at age 84. Talk with your health care provider about your test results, treatment options, and if necessary, the need for more tests. Follow these instructions at home: Eating and drinking  Eat a diet that includes fresh fruits and vegetables, whole grains, lean protein, and low-fat dairy products. Limit your intake of foods with high amounts of sugar, saturated fats, and salt.  Take vitamin and mineral supplements as recommended by your health care provider.  Do not drink alcohol if your health care provider  tells you not to drink.  If you drink alcohol: ? Limit how much you have to 0-1 drink a day. ? Be aware of how much alcohol is in your drink. In the [place] one drink equals one 12 oz bottle of beer (355 mL), one 5 oz glass of wine (148 mL), or one 1 oz glass of hard liquor (44 mL).   Lifestyle  Take daily care of your teeth and  gums. Brush your teeth every morning and night with fluoride toothpaste. Floss one time each day.  Stay active. Exercise for at least 30 minutes 5 or more days each week.  Do not use any products that contain nicotine or tobacco, such as cigarettes, e-cigarettes, and chewing tobacco. If you need help quitting, ask your health care provider.  Do not use drugs.  If you are sexually active, practice safe sex. Use a condom or other form of protection in order to prevent STIs (sexually transmitted infections).  Talk with your health care provider about taking a low-dose aspirin or statin.  Find healthy ways to cope with stress, such as: ? Meditation, yoga, or listening to music. ? Journaling. ? Talking to a trusted person. ? Spending time with friends and family. Safety  Always wear your seat belt while driving or riding in a vehicle.  Do not drive: ? If you have been drinking alcohol. Do not ride with someone who has been drinking. ? When you are tired or distracted. ? While texting.  Wear a helmet and other protective equipment during sports activities.  If you have firearms in your house, make sure you follow all gun safety procedures. What's next?  Visit your health care provider once a year for an annual wellness visit.  Ask your health care provider how often you should have your eyes and teeth checked.  Stay up to date on all vaccines. This information is not intended to replace advice given to you by your health care provider. Make sure you discuss any questions you have with your health care provider. Document Revised: 06/05/2020 Document Reviewed: 06/09/2018 Elsevier Patient Education  2021 [employer].

## 2020-11-19 NOTE — Assessment & Plan Note (Signed)
We have previously discussed risks and benefits of statin and aspirin therapy at her advanced age for primary prevention and patient decided to stop these medications with joint decision-making

## 2020-11-19 NOTE — Assessment & Plan Note (Signed)
Ongoing and stable Stopping amlodipine did not help her swelling in the past Discussed that this is not related to her heart We have discussed in the past that this could be related to her Imbruvica Encourage compression stockings and elevation Continue loop diuretic

## 2020-11-19 NOTE — Assessment & Plan Note (Signed)
Followed by oncology Continue Imbruvica

## 2020-11-19 NOTE — Assessment & Plan Note (Signed)
Well controlled Continue current medications Recheck metabolic panel F/u in 6 months  

## 2020-11-19 NOTE — Assessment & Plan Note (Signed)
Chronic and stable Continue to monitor 

## 2020-11-19 NOTE — Assessment & Plan Note (Signed)
Followed by neurology Related to possible Lewy body dementia Continue Zyprexa and rivastigmine

## 2020-11-19 NOTE — Assessment & Plan Note (Signed)
Continue to follow with neurology Encouraged her again to continue her medications

## 2020-11-19 NOTE — Assessment & Plan Note (Signed)
Continue statin. 

## 2020-11-19 NOTE — Progress Notes (Signed)
Complete physical exam   Patient: Barbara Chambers   DOB: April 04, 1935   86 y.o. Female  MRN: [id number] Visit Date: 11/19/2020  Today's healthcare provider: Lavon Paganini, MD   Chief Complaint  Patient presents with  . Hypertension  . Annual Exam   Subjective    Barbara Chambers is a 86 y.o. female who presents today for a complete physical exam.  She reports consuming a general diet. Home exercise routine includes walking 1 hrs per week. She generally feels well. She reports sleeping well. She does not have additional problems to discuss today.  HPI  Barbara Chambers is accompanied by her son today.   Rash She noticed a rash that is on her arms, hands and the back of her neck. She has flare ups and it sometimes itches. She's unsure what it could be associated with. - of note, this is an ongoing hallucination related to possible Lewy body dementia.  She continuously sees bugs and a rash on her that are not present.  I have advised her to use Eucerin cream for dry skin.  She does have multiple AK's, but does not want to see dermatology at this time.  Her son believes she may not be taking her Zyprexa consistently.  I encouraged her to take all of her medications consistently.  Ankle Edema She has ankle edema that has been going on for awhile. She sleeps with her legs elevated and she wears compression socks everyday to reduce the swelling. She typically notices more swelling in the morning and there is improvement throughout the day.   Neurology  She has been going to the neurologist and her son believes that is extremely healthy. However, she just doesn't like to take so many medications. Her son said at her last appointment with neurology it was recommended for her to take Neurology suggested she take vitamin B12 supplements.   Vaccines She reports receiving the COVID booster today. She is also up-to-date on the rest of the vaccines.   Hypertension, follow-up  BP Readings from Last 3  Encounters:  11/19/20 130/74  08/27/20 133/84  05/27/20 (!) 141/68   Wt Readings from Last 3 Encounters:  11/19/20 135 lb (61.2 kg)  08/27/20 135 lb 8 oz (61.5 kg)  05/27/20 140 lb (63.5 kg)     She was last seen for hypertension 8 months ago.  BP at that visit was 144/66. Management since that visit includes no changes.  She reports excellent compliance with treatment. She is not having side effects. She is following a Low Sodium diet. She is exercising. She does not smoke. She is compliant with her medications. Sometimes she forgets to take her medication  Use of agents associated with hypertension: none.   Outside blood pressures are stable. Symptoms: No chest pain No chest pressure  No palpitations No syncope  No dyspnea No orthopnea  No paroxysmal nocturnal dyspnea Yes lower extremity edema   Pertinent labs: Lab Results  Component Value Date   CHOL 179 09/28/2019   HDL 50 09/28/2019   LDLCALC 108 (H) 09/28/2019   TRIG 119 09/28/2019   CHOLHDL 3.6 09/28/2019   Lab Results  Component Value Date   NA 140 08/27/2020   K 3.5 08/27/2020   CREATININE 0.98 08/27/2020   GFRNONAA 57 (L) 08/27/2020   GFRAA >60 03/07/2020   GLUCOSE 144 (H) 08/27/2020     The ASCVD Risk score (Goff DC Jr., et al., 2013) failed to calculate for the following reasons:  The 2013 ASCVD risk score is only valid for ages 51 to 77   ---------------------------------------------------------------------------------------------------   Past Medical History:  Diagnosis Date  . Chronic lymphocytic leukemia ([place]) 2011  . Hyperlipidemia   . Hypertension    Past Surgical History:  Procedure Laterality Date  . cataract surgery Bilateral 2005  . CHOLECYSTECTOMY  1991  . RECONSTRUCTION OF EYELID     Social History   Socioeconomic History  . Marital status: Widowed    Spouse name: Casandra Doffing  . Number of children: 3  . Years of education: Not on file  . Highest education level: 12th grade   Occupational History  . Occupation: retired  Tobacco Use  . Smoking status: Never Smoker  . Smokeless tobacco: Never Used  Vaping Use  . Vaping Use: Never used  Substance and Sexual Activity  . Alcohol use: No  . Drug use: No  . Sexual activity: Not on file  Other Topics Concern  . Not on file  Social History Narrative   Pt has 1 son that was killed in a car accident at age 32.    [name] is primary caregiver   Social Determinants of Health   Financial Resource Strain: Low Risk   . Difficulty of Paying Living Expenses: Not hard at all  Food Insecurity: No Food Insecurity  . Worried About Charity fundraiser in the Last Year: Never true  . Ran Out of Food in the Last Year: Never true  Transportation Needs: No Transportation Needs  . Lack of Transportation (Medical): No  . Lack of Transportation (Non-Medical): No  Physical Activity: Inactive  . Days of Exercise per Week: 0 days  . Minutes of Exercise per Session: 0 min  Stress: No Stress Concern Present  . Feeling of Stress : Not at all  Social Connections: Moderately Integrated  . Frequency of Communication with Friends and Family: More than three times a week  . Frequency of Social Gatherings with Friends and Family: More than three times a week  . Attends Religious Services: More than 4 times per year  . Active Member of Clubs or Organizations: Yes  . Attends Archivist Meetings: More than 4 times per year  . Marital Status: Widowed  Intimate Partner Violence: Not At Risk  . Fear of Current or Ex-Partner: No  . Emotionally Abused: No  . Physically Abused: No  . Sexually Abused: No   Family Status  Relation Name Status  . Mother  Deceased at age 55  . Father  Deceased  . Brother  Alive  . Son  Alive  . Son  Deceased       killed in a car accident  . Son  Alive  . Neg Hx  (Not Specified)   Family History  Problem Relation Age of Onset  . Hypertension Mother   . Stroke Mother   . CAD Mother   .  Hypertension Father   . Heart attack Father   . Hypertension Brother   . Seizures Brother   . Hypertension Son   . Breast cancer Neg Hx    Allergies  Allergen Reactions  . Hctz [Hydrochlorothiazide]     Hyponatremia  . Penicillins Rash    Patient Care Team: [name], MD as PCP - General (Family Medicine) Lloyd Huger, MD as Consulting Physician (Oncology) Corey Skains, MD as Consulting Physician (Cardiology) Birder Robson, MD as Referring Physician (Ophthalmology)   Medications: Outpatient Medications Prior to Visit  Medication Sig  . amLODipine (NORVASC) 5 MG tablet TAKE 1 TABLET BY MOUTH EVERY DAY  . atorvastatin (LIPITOR) 40 MG tablet Take 40 mg by mouth daily.  Barbara Chambers Kitchen COENZYME Q-10 PO Take 200 mg by mouth daily.   . DULoxetine (CYMBALTA) 20 MG capsule TAKE 1 CAPSULE BY MOUTH EVERY DAY  . furosemide (LASIX) 20 MG tablet Take 20 mg by mouth daily.  Barbara Chambers Kitchen ibrutinib (IMBRUVICA) 420 MG TABS Take 1 tablet by mouth daily.  . metoprolol succinate (TOPROL-XL) 100 MG 24 hr tablet Take 1 tablet (100 mg total) by mouth daily. Take with or immediately following a meal.  . OLANZapine (ZYPREXA) 2.5 MG tablet TAKE 1 TABLET BY MOUTH EVERYDAY AT BEDTIME  . rivastigmine (EXELON) 1.5 MG capsule Take 1.5 mg by mouth 2 (two) times daily.  Barbara Chambers Kitchen telmisartan (MICARDIS) 40 MG tablet Take 40 mg by mouth daily.  . vitamin B-12 (CYANOCOBALAMIN) 1000 MCG tablet Take 1,000 mcg by mouth daily.  . [DISCONTINUED] ibrutinib 420 MG TABS TAKE 1 TABLET BY MOUTH DAILY.  . [DISCONTINUED] ibrutinib 420 MG TABS TAKE 1 TABLET BY MOUTH DAILY.  . [DISCONTINUED] losartan (COZAAR) 100 MG tablet TAKE 1 TABLET BY MOUTH EVERY DAY   No facility-administered medications prior to visit.    Review of Systems  Constitutional: Negative.  Negative for chills, fatigue and fever.  HENT: Negative.  Negative for ear pain, nosebleeds, rhinorrhea, sinus pressure, sinus pain and sore throat.   Eyes: Negative.   Negative for pain.  Respiratory: Negative.  Negative for cough, chest tightness, shortness of breath and wheezing.   Cardiovascular: Positive for leg swelling. Negative for chest pain and palpitations.  Gastrointestinal: Negative.  Negative for abdominal pain, blood in stool, diarrhea, nausea and vomiting.  Endocrine: Negative.   Genitourinary: Negative.  Negative for flank pain, frequency, pelvic pain and urgency.  Musculoskeletal: Negative.  Negative for back pain, neck pain and neck stiffness.  Skin: Negative.   Allergic/Immunologic: Negative.   Neurological: Negative.  Negative for dizziness, seizures, syncope, weakness, light-headedness, numbness and headaches.  Hematological: Negative.   Psychiatric/Behavioral: Negative.     Last CBC Lab Results  Component Value Date   WBC 16.0 (H) 08/27/2020   HGB 12.5 08/27/2020   HCT 39.7 08/27/2020   MCV 80.2 08/27/2020   MCH 25.3 (L) 08/27/2020   RDW 16.0 (H) 08/27/2020   PLT 375 16/03/9603   Last metabolic panel Lab Results  Component Value Date   GLUCOSE 144 (H) 08/27/2020   NA 140 08/27/2020   K 3.5 08/27/2020   CL 98 08/27/2020   CO2 28 08/27/2020   BUN 23 08/27/2020   CREATININE 0.98 08/27/2020   GFRNONAA 57 (L) 08/27/2020   GFRAA >60 03/07/2020   CALCIUM 8.5 (L) 08/27/2020   PHOS 3.3 03/17/2019   PROT 6.5 08/27/2020   ALBUMIN 3.6 08/27/2020   LABGLOB 2.1 09/28/2019   AGRATIO 2.0 09/28/2019   BILITOT 0.8 08/27/2020   ALKPHOS 99 08/27/2020   AST 31 08/27/2020   ALT 17 08/27/2020   ANIONGAP 14 08/27/2020   Last lipids Lab Results  Component Value Date   CHOL 179 09/28/2019   HDL 50 09/28/2019   LDLCALC 108 (H) 09/28/2019   TRIG 119 09/28/2019   CHOLHDL 3.6 09/28/2019   Last hemoglobin A1c No results found for: HGBA1C Last thyroid functions Lab Results  Component Value Date   TSH 4.970 (H) 09/28/2019   T4TOTAL 6.9 08/27/2016   Last vitamin D No results found for: 25OHVITD2, 25OHVITD3, VD25OH Last  vitamin B12 and Folate No results found for: VITAMINB12, FOLATE    Objective    BP 130/74 (BP Location: Left Arm, Patient Position: Sitting, Cuff Size: Normal)   Pulse 61   Temp 97.9 F (36.6 C) (Oral)   Resp 16   Ht 4\' 11"  (1.499 m)   Wt 135 lb (61.2 kg)   BMI 27.27 kg/m  BP Readings from Last 3 Encounters:  11/19/20 130/74  08/27/20 133/84  05/27/20 (!) 141/68   Wt Readings from Last 3 Encounters:  11/19/20 135 lb (61.2 kg)  08/27/20 135 lb 8 oz (61.5 kg)  05/27/20 140 lb (63.5 kg)      Physical Exam Vitals reviewed.  Constitutional:      General: She is not in acute distress.    Appearance: Normal appearance. She is well-developed. She is not diaphoretic.  HENT:     Head: Normocephalic and atraumatic.  Eyes:     General: No scleral icterus.    Conjunctiva/sclera: Conjunctivae normal.  Neck:     Thyroid: No thyromegaly.  Cardiovascular:     Rate and Rhythm: Normal rate and regular rhythm.     Pulses: Normal pulses.     Heart sounds: Normal heart sounds. No murmur heard.   Pulmonary:     Effort: Pulmonary effort is normal. No respiratory distress.     Breath sounds: Normal breath sounds. No wheezing, rhonchi or rales.  Musculoskeletal:     Cervical back: Neck supple.     Right lower leg: No edema.     Left lower leg: No edema.     Right ankle: Swelling present.     Left ankle: Swelling present.  Lymphadenopathy:     Cervical: No cervical adenopathy.  Skin:    General: Skin is warm and dry.     Findings: No rash.  Neurological:     Mental Status: She is alert and oriented to person, place, and time. Mental status is at baseline.  Psychiatric:        Mood and Affect: Mood normal.        Behavior: Behavior normal.       Last depression screening scores PHQ 2/9 Scores 10/03/2020 05/27/2020 09/01/2019  PHQ - 2 Score 0 0 0  PHQ- 9 Score - 1 2   Last fall risk screening Fall Risk  10/03/2020  Falls in the past year? 1  Number falls in past yr: 0   Comment -  Injury with Fall? 0  Comment -  Risk for fall due to : -  Follow up Falls prevention discussed   Last Audit-C alcohol use screening Alcohol Use Disorder Test (AUDIT) 10/03/2020  1. How often do you have a drink containing alcohol? 0  2. How many drinks containing alcohol do you have on a typical day when you are drinking? 0  3. How often do you have six or more drinks on one occasion? 0  AUDIT-C Score 0  Alcohol Brief Interventions/Follow-up -   A score of 3 or more in women, and 4 or more in men indicates increased risk for alcohol abuse, EXCEPT if all of the points are from question 1   No results found for any visits on 11/19/20.  Assessment & Plan     Problem List Items Addressed This Visit      Cardiovascular and Mediastinum   Benign essential HTN    Well controlled Continue current medications Recheck metabolic panel F/u in 6 months  Senile purpura (HCC)    Chronic and stable Continue to monitor      Atherosclerosis of abdominal aorta (HCC)    Continue statin        Other   CLL (chronic lymphocytic leukemia) (HCC)    Followed by oncology Continue Imbruvica      HLD (hyperlipidemia)    We have previously discussed risks and benefits of statin and aspirin therapy at her advanced age for primary prevention and patient decided to stop these medications with joint decision-making      Relevant Orders   Lipid panel   Hallucinations    Followed by neurology Related to possible Lewy body dementia Continue Zyprexa and rivastigmine      Memory loss    Continue to follow with neurology Encouraged her again to continue her medications      Bilateral leg edema    Ongoing and stable Stopping amlodipine did not help her swelling in the past Discussed that this is not related to her heart We have discussed in the past that this could be related to her Imbruvica Encourage compression stockings and elevation Continue loop diuretic        Other Visit Diagnoses    Encounter for annual physical exam    -  Primary   Need for COVID-19 vaccine       Encounter for immunization       Relevant Orders   PFIZER Comirnaty(GRAY TOP)COVID-19 Vaccine (Completed)      Routine Health Maintenance and Physical Exam  Exercise Activities and Dietary recommendations Goals    .  "I need to make sure mom follows up with a psychiatrist" (pt-stated)      Current Barriers:  Barbara Chambers Kitchen Mental Health Concerns   Clinical Social Work Clinical Goal(s):  Barbara Chambers Kitchen Over the next 30 days, client will follow up with [facility] as directed by patient's provider to address continued delusion that bed bugs remain in her home  Interventions: . [name] who is on DPR  interviewed and appropriate assessments performed . Followed up on  referral to [facility] to address delusional disorder . Confirmed that patient appointment scheduled for 03/28/19 with [name] ANP-C for medication assessment was kept . Patient's EMR confirmed that patient does not have a UTI and she can now stop the antibiotics . Encouraged patient's son to continue to support patient with ongoing mental health follow up with [facility] for medication management. . [name] provided  with direct contact information for this social worker in the event that any community resource needs arise   Patient Self Care Activities:  . Attends all scheduled provider appointments . Performs ADL's independently . Unable to perform IADLs independently  Please see past updates related to this goal by clicking on the "Past Updates" button in the selected goal      .  Chronic Care Management      CARE PLAN ENTRY  Current Barriers:  . Chronic Disease Management support, education, and care coordination needs related to hyperlipidemia, hypertension, and cognition .   Hypertension . Pharmacist Clinical Goal(s): o Over the next 90 days, patient will work with  PharmD and providers to achieve BP goal <130/80 . Current regimen:  o Amlodipine 5 mg daily, losartan 100mg  daily, Toprol XL 50mg  daily o All taken in the morning . Interventions: o Counseled that med adjustment difficult due to diastolic 56 - 63 o Take metoprolol succinate 50mg  at bedtime . Patient self care activities - Over the  next 90 days, patient will: o Check BP daily, document, and provide at future appointments o Ensure daily salt intake < 2300 mg/day  Hyperlipidemia . Pharmacist Clinical Goal(s): o Over the next 90 days, patient will work with PharmD and providers to achieve LDL goal < 70 o Per neurology, low LDL important for memory . Current regimen:  o Simvastatin 10mg  at bedtime . Interventions: o Stop simvastatin o Start atorvastatin 40mg  1 tab by mouth daily o Follow up lab work at 6 weeks . Patient self care activities - Over the next 90 days, patient will: o Report any unexplained muscle pain to MD or PharmD o Continue atorvastatin 40mg  daily  Cognition . Pharmacist Clinical Goal(s) o Over the next 90 days, patient will work with PharmD and providers to reduce unwanted thoughts . Current regimen:  o Zyprexa 2.5mg  with snack at bedtime o Exelon 1.5mg  twice daily . Interventions: o None . Patient self care activities - Over the next 90 days, patient will: o Continue taking Zyprexa with a light snack at night  Medication management . Pharmacist Clinical Goal(s): o Over the next 90 days, patient will work with PharmD and providers to maintain optimal medication adherence . Current pharmacy: [facility] . Interventions o Comprehensive medication review performed. o Continue current medication management strategy . Patient self care activities - Over the next 90 days, patient will: o Focus on medication adherence by working with son o Take medications as prescribed o Report any questions or concerns to PharmD and/or provider(s)  Initial goal documentation     .   Exercise      Starting 06/24/16, I will start back walking 4 days a week for 45 minutes.    Barbara Chambers Kitchen  LIFESTYLE - DECREASE FALLS RISK      Recommend to drink at least 6-8 8oz glasses of water per day.     .  Prevent falls      Recommend to remove any items from the home that may cause slips or trips.       Immunization History  Administered Date(s) Administered  . Fluad Quad(high Dose 65+) 03/17/2019, 03/07/2020  . Influenza, High Dose Seasonal PF 06/24/2016, 04/20/2017, 05/04/2018  . PFIZER Comirnaty(Gray Top)Covid-19 Tri-Sucrose Vaccine 11/19/2020  . PFIZER(Purple Top)SARS-COV-2 Vaccination 09/02/2019, 03/07/2020  . Pneumococcal Conjugate-13 03/30/2014  . Pneumococcal Polysaccharide-23 04/20/2017  . Td 04/05/2017  . Tdap 07/14/2005    Health Maintenance  Topic Date Due  . INFLUENZA VACCINE  01/27/2021  . COVID-19 Vaccine (4 - Booster for Pfizer series) 02/19/2021  . TETANUS/TDAP  04/06/2027  . DEXA SCAN  Completed  . PNA vac Low Risk Adult  Completed  . HPV VACCINES  Aged Out    Discussed health benefits of physical activity, and encouraged her to engage in regular exercise appropriate for her age and condition.    Return in about 6 months (around 05/22/2021) for chronic disease f/u.     I,Essence Turner,acting as a Education administrator for Lavon Paganini, MD.,have documented all relevant documentation on the behalf of Lavon Paganini, MD,as directed by  Lavon Paganini, MD while in the presence of Lavon Paganini, MD.  I, Lavon Paganini, MD, have reviewed all documentation for this visit. The documentation on 11/19/20 for the exam, diagnosis, procedures, and orders are all accurate and complete.   Satara Virella, [name], MD, MPH [facility]

## 2020-11-21 NOTE — Progress Notes (Signed)
[place]  [phone]  Fax:([phone]     Barbara Chambers DOB: 22-May-1935  MR#: [id number]  RJJ#:[id number]  Patient Care Team: [name], MD as PCP - General (Family Medicine) Lloyd Huger, MD as Consulting Physician (Oncology) Corey Skains, MD as Consulting Physician (Cardiology) Birder Robson, MD as Referring Physician (Ophthalmology)   CHIEF COMPLAINT: CLL.  INTERVAL HISTORY: Patient returns to clinic today for repeat laboratory work, discussion of her imaging results, and routine 27-month evaluation.  She continues to tolerate Imbruvica well without significant side effects.  She currently feels well and is asymptomatic.  She does not complain of any weakness or fatigue. She has no neurologic complaints. She denies any fevers, night sweats, or weight loss. She denies any chest pain, shortness of breath, cough, or hemoptysis.  She denies any nausea, vomiting, constipation, or diarrhea. She has no urinary complaints.  Patient offers no specific complaints today.  REVIEW OF SYSTEMS:   Review of Systems  Constitutional: Negative.  Negative for diaphoresis, fever, malaise/fatigue and weight loss.  Respiratory: Negative.  Negative for cough and shortness of breath.   Cardiovascular: Negative.  Negative for chest pain and leg swelling.  Gastrointestinal: Negative.  Negative for abdominal pain.  Genitourinary: Negative.  Negative for dysuria.  Musculoskeletal: Negative.  Negative for back pain.  Skin: Negative.  Negative for rash.  Neurological: Negative.  Negative for dizziness, sensory change, focal weakness, weakness and headaches.  Psychiatric/Behavioral: Positive for memory loss. Negative for depression. The patient is not nervous/anxious.     As per HPI. Otherwise, a complete review of systems is negative.  PAST MEDICAL HISTORY: Past Medical History:  Diagnosis Date  . Chronic lymphocytic leukemia ([place]) 2011  .  Hyperlipidemia   . Hypertension     PAST SURGICAL HISTORY: Past Surgical History:  Procedure Laterality Date  . cataract surgery Bilateral 2005  . CHOLECYSTECTOMY  1991  . RECONSTRUCTION OF EYELID      FAMILY HISTORY Family History  Problem Relation Age of Onset  . Hypertension Mother   . Stroke Mother   . CAD Mother   . Hypertension Father   . Heart attack Father   . Hypertension Brother   . Seizures Brother   . Hypertension Son   . Breast cancer Neg Hx     GYNECOLOGIC HISTORY:  No LMP recorded. Patient is postmenopausal.     ADVANCED DIRECTIVES:    HEALTH MAINTENANCE: Social History   Tobacco Use  . Smoking status: Never Smoker  . Smokeless tobacco: Never Used  Vaping Use  . Vaping Use: Never used  Substance Use Topics  . Alcohol use: No  . Drug use: No     Colonoscopy:  PAP:  Bone density:  Lipid panel:  Allergies  Allergen Reactions  . Hctz [Hydrochlorothiazide]     Hyponatremia  . Penicillins Rash    Current Outpatient Medications  Medication Sig Dispense Refill  . amLODipine (NORVASC) 5 MG tablet TAKE 1 TABLET BY MOUTH EVERY DAY 90 tablet 1  . atorvastatin (LIPITOR) 40 MG tablet Take 40 mg by mouth daily.    [name] COENZYME Q-10 PO Take 200 mg by mouth daily.     . DULoxetine (CYMBALTA) 20 MG capsule TAKE 1 CAPSULE BY MOUTH EVERY DAY 90 capsule 0  . furosemide (LASIX) 20 MG tablet Take 20 mg by mouth daily.    [name] ibrutinib (IMBRUVICA) 420 MG TABS Take 1 tablet by mouth daily. 28 tablet 5  . metoprolol  succinate (TOPROL-XL) 100 MG 24 hr tablet Take 1 tablet (100 mg total) by mouth daily. Take with or immediately following a meal. 90 tablet 1  . OLANZapine (ZYPREXA) 2.5 MG tablet TAKE 1 TABLET BY MOUTH EVERYDAY AT BEDTIME 90 tablet 1  . rivastigmine (EXELON) 1.5 MG capsule Take 1.5 mg by mouth 2 (two) times daily.    [name] telmisartan (MICARDIS) 40 MG tablet Take 40 mg by mouth daily.    . vitamin B-12 (CYANOCOBALAMIN) 1000 MCG tablet Take 1,000 mcg by  mouth daily.     No current facility-administered medications for this visit.    OBJECTIVE: BP (!) 144/70   Pulse 65   Temp (!) 97.2 F (36.2 C) (Tympanic)   Resp 20   Wt 136 lb (61.7 kg)   BMI 27.47 kg/m    Body mass index is 27.47 kg/m.    ECOG FS:0 - Asymptomatic   General: Well-developed, well-nourished, no acute distress. Eyes: Pink conjunctiva, anicteric sclera. HEENT: Normocephalic, moist mucous membranes. Lungs: No audible wheezing or coughing. Heart: Regular rate and rhythm. Abdomen: Soft, nontender, no obvious distention. Musculoskeletal: No edema, cyanosis, or clubbing. Neuro: Alert, answering all questions appropriately. Cranial nerves grossly intact. Skin: No rashes or petechiae noted. Psych: Normal affect.    LAB RESULTS:  Appointment on 11/28/2020  Component Date Value Ref Range Status  . Sodium 11/28/2020 139  135 - 145 mmol/L Final  . Potassium 11/28/2020 3.4* 3.5 - 5.1 mmol/L Final  . Chloride 11/28/2020 101  98 - 111 mmol/L Final  . CO2 11/28/2020 28  22 - 32 mmol/L Final  . Glucose, Bld 11/28/2020 116* 70 - 99 mg/dL Final   Glucose reference range applies only to samples taken after fasting for at least 8 hours.  . BUN 11/28/2020 15  8 - 23 mg/dL Final  . Creatinine, Ser 11/28/2020 0.77  0.44 - 1.00 mg/dL Final  . Calcium 11/28/2020 8.8* 8.9 - 10.3 mg/dL Final  . Total Protein 11/28/2020 6.5  6.5 - 8.1 g/dL Final  . Albumin 11/28/2020 3.7  3.5 - 5.0 g/dL Final  . AST 11/28/2020 27  15 - 41 U/L Final  . ALT 11/28/2020 18  0 - 44 U/L Final  . Alkaline Phosphatase 11/28/2020 87  38 - 126 U/L Final  . Total Bilirubin 11/28/2020 0.6  0.3 - 1.2 mg/dL Final  . GFR, Estimated 11/28/2020 >60  >60 mL/min Final   Comment: (NOTE) Calculated using the CKD-EPI Creatinine Equation (2021)   . Anion gap 11/28/2020 10  5 - 15 Final   Performed at [facility], [address] [place], [place] [address]  . WBC 11/28/2020 13.4* 4.0 - 10.5 K/uL Final  .  RBC 11/28/2020 4.53  3.87 - 5.11 MIL/uL Final  . Hemoglobin 11/28/2020 11.6* 12.0 - 15.0 g/dL Final  . HCT 11/28/2020 36.5  36.0 - 46.0 % Final  . MCV 11/28/2020 80.6  80.0 - 100.0 fL Final  . MCH 11/28/2020 25.6* 26.0 - 34.0 pg Final  . MCHC 11/28/2020 31.8  30.0 - 36.0 g/dL Final  . RDW 11/28/2020 14.8  11.5 - 15.5 % Final  . Platelets 11/28/2020 263  150 - 400 K/uL Final  . nRBC 11/28/2020 0.0  0.0 - 0.2 % Final  . Neutrophils Relative % 11/28/2020 35  % Final  . Neutro Abs 11/28/2020 4.7  1.7 - 7.7 K/uL Final  . Lymphocytes Relative 11/28/2020 61  % Final  . Lymphs Abs 11/28/2020 8.2* 0.7 - 4.0 K/uL  Final  . Monocytes Relative 11/28/2020 4  % Final  . Monocytes Absolute 11/28/2020 0.5  0.1 - 1.0 K/uL Final  . Eosinophils Relative 11/28/2020 0  % Final  . Eosinophils Absolute 11/28/2020 0.0  0.0 - 0.5 K/uL Final  . Basophils Relative 11/28/2020 0  % Final  . Basophils Absolute 11/28/2020 0.0  0.0 - 0.1 K/uL Final  . WBC Morphology 11/28/2020 DIFF CONFIRMED, CONSISTANT WITH KNOWN CLL   Final  . RBC Morphology 11/28/2020 UNREMARKABLE   Final  . Smear Review 11/28/2020 PLATELETS APPEAR ADEQUATE   Final   LARGE AND GIANT PLATELETS NOTED  . Abs Immature Granulocytes 11/28/2020 0.00  0.00 - 0.07 K/uL Final   Performed at [facility], [address], [place], [place] [address]  Hospital Outpatient Visit on 11/26/2020  Component Date Value Ref Range Status  . Creatinine, Ser 11/26/2020 1.00  0.44 - 1.00 mg/dL Final    STUDIES: CT CHEST ABDOMEN PELVIS W CONTRAST  Result Date: 11/26/2020 CLINICAL DATA:  Follow-up CLL, currently on Imbruvica therapy EXAM: CT CHEST, ABDOMEN, AND PELVIS WITH CONTRAST TECHNIQUE: Multidetector CT imaging of the chest, abdomen and pelvis was performed following the standard protocol during bolus administration of intravenous contrast. CONTRAST:  95mL OMNIPAQUE IOHEXOL 300 MG/ML  SOLN COMPARISON:  Multiple priors including most recent CT chest abdomen and  pelvis April 05, 2020. FINDINGS: CT CHEST FINDINGS Cardiovascular: Aortic and branch vessel atherosclerosis without aneurysmal dilation. No central pulmonary embolus. Calcifications of mitral annulus. Normal size heart. No significant pericardial effusion/thickening. Mediastinum/Nodes: No discrete thyroid nodularity. As before there is contrast and gas in the distal esophagus suggesting reflux or dysmotility. Overall similar to slightly decreased size of the index and non index mediastinal, hilar and axillary lymph nodes with index lesions as follows: - Left axillary node 0.7 cm in short axis on image 20/2, unchanged. - Right paratracheal lymph node 0.6 cm in short axis on image 13/2, previously 0.7 cm. - Right axillary lymph node 0.4 cm in short axis on image 14/2, previously 0.6 cm. - AP window lymph node 0.5 cm in short axis on image 20/2, unchanged. Lungs/Pleura: Similar mild peripheral reticulonodular opacities in the bilateral lungs which again are nonspecific but likely inflammatory. No significant change in the nodular region of volume loss in the right middle lobe measuring 8 mm in thickness on image 79/3. No suspicious pulmonary nodules or masses. No pleural effusion. No pneumothorax. Musculoskeletal: Multilevel degenerative changes spine. No acute osseous abnormality. CT ABDOMEN PELVIS FINDINGS Hepatobiliary: No suspicious hepatic lesion. Gallbladder is surgically absent. No biliary ductal dilation. Pancreas: Within normal limits. Spleen: Similar appearance of the several tiny hypodense splenic lesions measuring up to 4 mm on image 52/2. No splenomegaly. Adrenals/Urinary Tract: Stable nodularity along the lateral limb of the right adrenal gland without discrete masslike lesion. Left adrenal glands unremarkable. No hydronephrosis. Right lower pole renal cyst measuring 2.3 cm. No solid enhancing renal lesions. Urinary bladder is grossly unremarkable. Stomach/Bowel: Orally ingested radiopaque enteric  contrast traverses the hepatic flexure. Stomach is grossly unremarkable. Normal positioning of the duodenum/ligament of Treitz. No pathologic dilation of small bowel. Similar thickening of the terminal appendix measuring 8 mm in diameter. Sigmoid colonic diverticulosis without findings of acute diverticulitis. Vascular/Lymphatic: Aortic atherosclerosis without aneurysmal dilation. No significant change in the previously indexed and non index abdominopelvic lymph nodes. Index lymph nodes are as follows: - Portahepatis/peripancreatic lymph node 0.8 cm in short axis on image 51/2, unchanged. - Portacaval node 1.0 cm in short axis  on image 55/2, previously 1.1 cm. - Right common iliac node measures 0.6 cm in short axis on image 75/2, unchanged. Reproductive: Leiomyomatous uterus with stable appearance of the bilateral adnexa. Other: No abdominopelvic ascites. Musculoskeletal: Levoconvex curvature of the lumbar spine. Multilevel degenerative changes spine. Degenerative grade 1 retrolisthesis at L1-2 and L2-3 with grade 1 anterolisthesis at L4-5. IMPRESSION: 1. Stable disease in the chest abdomen and pelvis. 2. Similar thickening of the terminal appendix which is of uncertain significance, and once again small appendiceal mass is not readily excluded. 3. Sigmoid colonic diverticulosis without findings of acute diverticulitis. 4. Similar mild peripheral reticulonodular opacities in the bilateral lungs which again are nonspecific but likely inflammatory. 5. Aortic atherosclerosis. Aortic Atherosclerosis (ICD10-I70.0). Electronically Signed   By: Dahlia Bailiff MD   On: 11/26/2020 11:42    ASSESSMENT: CLL.  PLAN:    1.  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  Patient initiated Imbruvica in May 2021.  Her white blood cell count continues to improve and is now 13.4.  Her most recent CT scan on Nov 26, 2020 was reviewed independently and reported as  above with essentially stable disease.  Continue Imbruvica as prescribed. Plan to continue treatment until progression of disease or intolerable side effects.  Return to clinic in 3 months for laboratory work and evaluation by clinical pharmacy.  Patient will then return to clinic in 6 months with repeat imaging, laboratory work and further evaluation.   2.  Anemia: Mild, monitor. 3.  Hypokalemia: Mild, monitor.  I spent a total of 30 minutes reviewing chart data, face-to-face evaluation with the patient, counseling and coordination of care as detailed above.   Patient expressed understanding and was in agreement with this plan. She also understands that She can call clinic at any time with any questions, concerns, or complaints.    Lloyd Huger, MD   11/29/2020 6:11 AM

## 2020-11-26 ENCOUNTER — Other Ambulatory Visit: Payer: Self-pay

## 2020-11-26 ENCOUNTER — Ambulatory Visit
Admission: RE | Admit: 2020-11-26 | Discharge: 2020-11-26 | Disposition: A | Discharge status: Home / Self Care | Payer: PPO | Source: Ambulatory Visit | Attending: Oncology | Admitting: Oncology

## 2020-11-26 DIAGNOSIS — C9110 Chronic lymphocytic leukemia of B-cell type not having achieved remission: Secondary | ICD-10-CM

## 2020-11-26 DIAGNOSIS — N281 Cyst of kidney, acquired: Secondary | ICD-10-CM | POA: Diagnosis not present

## 2020-11-26 DIAGNOSIS — K6389 Other specified diseases of intestine: Secondary | ICD-10-CM | POA: Diagnosis not present

## 2020-11-26 DIAGNOSIS — I700 Atherosclerosis of aorta: Secondary | ICD-10-CM | POA: Diagnosis not present

## 2020-11-26 DIAGNOSIS — D7389 Other diseases of spleen: Secondary | ICD-10-CM | POA: Diagnosis not present

## 2020-11-26 DIAGNOSIS — M4316 Spondylolisthesis, lumbar region: Secondary | ICD-10-CM | POA: Diagnosis not present

## 2020-11-26 LAB — POCT I-STAT CREATININE: Creatinine, Ser: 1 mg/dL (ref 0.44–1.00)

## 2020-11-26 MED ORDER — IOHEXOL 300 MG/ML  SOLN
85.0000 mL | Freq: Once | INTRAMUSCULAR | Status: AC | PRN
  Administered 2020-11-26: 85 mL via INTRAVENOUS

## 2020-11-27 ENCOUNTER — Other Ambulatory Visit: Payer: Self-pay | Admitting: *Deleted

## 2020-11-27 DIAGNOSIS — C9110 Chronic lymphocytic leukemia of B-cell type not having achieved remission: Secondary | ICD-10-CM

## 2020-11-28 ENCOUNTER — Inpatient Hospital Stay: Payer: PPO

## 2020-11-28 ENCOUNTER — Inpatient Hospital Stay: Payer: PPO | Admitting: Pharmacist

## 2020-11-28 ENCOUNTER — Inpatient Hospital Stay: Payer: PPO | Attending: Oncology | Admitting: Oncology

## 2020-11-28 ENCOUNTER — Other Ambulatory Visit (HOSPITAL_COMMUNITY): Payer: Self-pay

## 2020-11-28 ENCOUNTER — Encounter: Payer: Self-pay | Admitting: Oncology

## 2020-11-28 VITALS — BP 144/70 | HR 65 | Temp 97.2°F | Resp 20 | Wt 136.0 lb

## 2020-11-28 DIAGNOSIS — C9110 Chronic lymphocytic leukemia of B-cell type not having achieved remission: Secondary | ICD-10-CM | POA: Diagnosis not present

## 2020-11-28 DIAGNOSIS — E876 Hypokalemia: Secondary | ICD-10-CM | POA: Insufficient documentation

## 2020-11-28 DIAGNOSIS — D649 Anemia, unspecified: Secondary | ICD-10-CM | POA: Insufficient documentation

## 2020-11-28 LAB — CBC WITH DIFFERENTIAL/PLATELET
Abs Immature Granulocytes: 0 10*3/uL (ref 0.00–0.07)
Basophils Absolute: 0 10*3/uL (ref 0.0–0.1)
Basophils Relative: 0 %
Eosinophils Absolute: 0 10*3/uL (ref 0.0–0.5)
Eosinophils Relative: 0 %
HCT: 36.5 % (ref 36.0–46.0)
Hemoglobin: 11.6 g/dL — ABNORMAL LOW (ref 12.0–15.0)
Lymphocytes Relative: 61 %
Lymphs Abs: 8.2 10*3/uL — ABNORMAL HIGH (ref 0.7–4.0)
MCH: 25.6 pg — ABNORMAL LOW (ref 26.0–34.0)
MCHC: 31.8 g/dL (ref 30.0–36.0)
MCV: 80.6 fL (ref 80.0–100.0)
Monocytes Absolute: 0.5 10*3/uL (ref 0.1–1.0)
Monocytes Relative: 4 %
Neutro Abs: 4.7 10*3/uL (ref 1.7–7.7)
Neutrophils Relative %: 35 %
Platelets: 263 10*3/uL (ref 150–400)
RBC: 4.53 MIL/uL (ref 3.87–5.11)
RDW: 14.8 % (ref 11.5–15.5)
Smear Review: ADEQUATE
WBC: 13.4 10*3/uL — ABNORMAL HIGH (ref 4.0–10.5)
nRBC: 0 % (ref 0.0–0.2)

## 2020-11-28 LAB — COMPREHENSIVE METABOLIC PANEL
ALT: 18 U/L (ref 0–44)
AST: 27 U/L (ref 15–41)
Albumin: 3.7 g/dL (ref 3.5–5.0)
Alkaline Phosphatase: 87 U/L (ref 38–126)
Anion gap: 10 (ref 5–15)
BUN: 15 mg/dL (ref 8–23)
CO2: 28 mmol/L (ref 22–32)
Calcium: 8.8 mg/dL — ABNORMAL LOW (ref 8.9–10.3)
Chloride: 101 mmol/L (ref 98–111)
Creatinine, Ser: 0.77 mg/dL (ref 0.44–1.00)
GFR, Estimated: >60 mL/min (ref >60)
Glucose, Bld: 116 mg/dL — ABNORMAL HIGH (ref 70–99)
Potassium: 3.4 mmol/L — ABNORMAL LOW (ref 3.5–5.1)
Sodium: 139 mmol/L (ref 135–145)
Total Bilirubin: 0.6 mg/dL (ref 0.3–1.2)
Total Protein: 6.5 g/dL (ref 6.5–8.1)

## 2020-11-28 NOTE — Progress Notes (Signed)
Patient here today for follow up regarding CLL, CT results.

## 2020-11-29 ENCOUNTER — Other Ambulatory Visit: Payer: Self-pay | Admitting: Family Medicine

## 2020-11-29 NOTE — Progress Notes (Signed)
[place]  [phone] Fax:([phone]  Patient Care Team: [name], MD as PCP - General (Family Medicine) Lloyd Huger, MD as Consulting Physician (Oncology) Corey Skains, MD as Consulting Physician (Cardiology) Birder Robson, MD as Referring Physician (Ophthalmology)   Name of the patient: Barbara Chambers  [id number]  Jul 18, 1934   Date of visit: 11/29/20  HPI: Patient is a 86 y.o. female with chronic lymphocytic leukemia.  Reason for Consult: Oral chemotherapy follow-up for Imbruvica (ibrutinib) therapy.   PAST MEDICAL HISTORY: Past Medical History:  Diagnosis Date  . Chronic lymphocytic leukemia ([place]) 2011  . Hyperlipidemia   . Hypertension     PAST SURGICAL HISTORY:  Past Surgical History:  Procedure Laterality Date  . cataract surgery Bilateral 2005  . CHOLECYSTECTOMY  1991  . RECONSTRUCTION OF EYELID      HEMATOLOGY/ONCOLOGY HISTORY:  Oncology History   No history exists.    ALLERGIES:  is allergic to hctz [hydrochlorothiazide] and penicillins.  MEDICATIONS:  Current Outpatient Medications  Medication Sig Dispense Refill  . amLODipine (NORVASC) 5 MG tablet TAKE 1 TABLET BY MOUTH EVERY DAY 90 tablet 1  . atorvastatin (LIPITOR) 40 MG tablet Take 40 mg by mouth daily.    [name] COENZYME Q-10 PO Take 200 mg by mouth daily.     . DULoxetine (CYMBALTA) 20 MG capsule TAKE 1 CAPSULE BY MOUTH EVERY DAY 90 capsule 0  . furosemide (LASIX) 20 MG tablet Take 20 mg by mouth daily.    [name] ibrutinib (IMBRUVICA) 420 MG TABS Take 1 tablet by mouth daily. 28 tablet 5  . metoprolol succinate (TOPROL-XL) 100 MG 24 hr tablet Take 1 tablet (100 mg total) by mouth daily. Take with or immediately following a meal. 90 tablet 1  . OLANZapine (ZYPREXA) 2.5 MG tablet TAKE 1 TABLET BY MOUTH EVERYDAY AT BEDTIME 90 tablet 1  . rivastigmine (EXELON) 1.5 MG capsule Take 1.5 mg by mouth 2 (two) times daily.     [name] telmisartan (MICARDIS) 40 MG tablet Take 40 mg by mouth daily.    . vitamin B-12 (CYANOCOBALAMIN) 1000 MCG tablet Take 1,000 mcg by mouth daily.     No current facility-administered medications for this visit.    VITAL SIGNS: There were no vitals taken for this visit. There were no vitals filed for this visit.  Estimated body mass index is 27.47 kg/m as calculated from the following:   Height as of 11/19/20: 4\' 11"  (1.499 m).   Weight as of an earlier encounter on 11/28/20: 61.7 kg (136 lb).  LABS: CBC:    Component Value Date/Time   WBC 13.4 (H) 11/28/2020 1411   HGB 11.6 (L) 11/28/2020 1411   HGB 12.9 09/28/2019 0936   HCT 36.5 11/28/2020 1411   HCT 41.2 09/28/2019 0936   PLT 263 11/28/2020 1411   PLT 262 09/28/2019 0936   MCV 80.6 11/28/2020 1411   MCV 87 09/28/2019 0936   MCV 84 03/19/2014 1000   NEUTROABS 4.7 11/28/2020 1411   NEUTROABS 6.1 03/01/2019 1050   NEUTROABS 4.9 03/19/2014 1000   LYMPHSABS 8.2 (H) 11/28/2020 1411   LYMPHSABS 17.1 (H) 03/01/2019 1050   LYMPHSABS 8.1 (H) 03/19/2014 1000   MONOABS 0.5 11/28/2020 1411   MONOABS 0.6 03/19/2014 1000   EOSABS 0.0 11/28/2020 1411   EOSABS 0.0 03/01/2019 1050   EOSABS 0.1 03/19/2014 1000   BASOSABS 0.0 11/28/2020 1411   BASOSABS 0.0 03/01/2019 1050   BASOSABS 0.2 (H)  03/19/2014 1000   Comprehensive Metabolic Panel:    Component Value Date/Time   NA 139 11/28/2020 1411   NA 141 09/28/2019 0936   K 3.4 (L) 11/28/2020 1411   CL 101 11/28/2020 1411   CO2 28 11/28/2020 1411   BUN 15 11/28/2020 1411   BUN 16 09/28/2019 0936   CREATININE 0.77 11/28/2020 1411   CREATININE 0.78 04/20/2017 0914   GLUCOSE 116 (H) 11/28/2020 1411   CALCIUM 8.8 (L) 11/28/2020 1411   AST 27 11/28/2020 1411   AST 25 02/15/2012 1101   ALT 18 11/28/2020 1411   ALT 27 02/15/2012 1101   ALKPHOS 87 11/28/2020 1411   ALKPHOS 89 02/15/2012 1101   BILITOT 0.6 11/28/2020 1411   BILITOT 0.4 09/28/2019 0936   BILITOT 0.4 02/15/2012 1101    PROT 6.5 11/28/2020 1411   PROT 6.2 09/28/2019 0936   PROT 6.9 02/15/2012 1101   ALBUMIN 3.7 11/28/2020 1411   ALBUMIN 4.1 09/28/2019 0936   ALBUMIN 3.7 02/15/2012 1101    RADIOGRAPHIC STUDIES: CT CHEST ABDOMEN PELVIS W CONTRAST  Result Date: 11/26/2020 CLINICAL DATA:  Follow-up CLL, currently on Imbruvica therapy EXAM: CT CHEST, ABDOMEN, AND PELVIS WITH CONTRAST TECHNIQUE: Multidetector CT imaging of the chest, abdomen and pelvis was performed following the standard protocol during bolus administration of intravenous contrast. CONTRAST:  49mL OMNIPAQUE IOHEXOL 300 MG/ML  SOLN COMPARISON:  Multiple priors including most recent CT chest abdomen and pelvis April 05, 2020. FINDINGS: CT CHEST FINDINGS Cardiovascular: Aortic and branch vessel atherosclerosis without aneurysmal dilation. No central pulmonary embolus. Calcifications of mitral annulus. Normal size heart. No significant pericardial effusion/thickening. Mediastinum/Nodes: No discrete thyroid nodularity. As before there is contrast and gas in the distal esophagus suggesting reflux or dysmotility. Overall similar to slightly decreased size of the index and non index mediastinal, hilar and axillary lymph nodes with index lesions as follows: - Left axillary node 0.7 cm in short axis on image 20/2, unchanged. - Right paratracheal lymph node 0.6 cm in short axis on image 13/2, previously 0.7 cm. - Right axillary lymph node 0.4 cm in short axis on image 14/2, previously 0.6 cm. - AP window lymph node 0.5 cm in short axis on image 20/2, unchanged. Lungs/Pleura: Similar mild peripheral reticulonodular opacities in the bilateral lungs which again are nonspecific but likely inflammatory. No significant change in the nodular region of volume loss in the right middle lobe measuring 8 mm in thickness on image 79/3. No suspicious pulmonary nodules or masses. No pleural effusion. No pneumothorax. Musculoskeletal: Multilevel degenerative changes spine. No acute  osseous abnormality. CT ABDOMEN PELVIS FINDINGS Hepatobiliary: No suspicious hepatic lesion. Gallbladder is surgically absent. No biliary ductal dilation. Pancreas: Within normal limits. Spleen: Similar appearance of the several tiny hypodense splenic lesions measuring up to 4 mm on image 52/2. No splenomegaly. Adrenals/Urinary Tract: Stable nodularity along the lateral limb of the right adrenal gland without discrete masslike lesion. Left adrenal glands unremarkable. No hydronephrosis. Right lower pole renal cyst measuring 2.3 cm. No solid enhancing renal lesions. Urinary bladder is grossly unremarkable. Stomach/Bowel: Orally ingested radiopaque enteric contrast traverses the hepatic flexure. Stomach is grossly unremarkable. Normal positioning of the duodenum/ligament of Treitz. No pathologic dilation of small bowel. Similar thickening of the terminal appendix measuring 8 mm in diameter. Sigmoid colonic diverticulosis without findings of acute diverticulitis. Vascular/Lymphatic: Aortic atherosclerosis without aneurysmal dilation. No significant change in the previously indexed and non index abdominopelvic lymph nodes. Index lymph nodes are as follows: - Portahepatis/peripancreatic lymph node 0.8  cm in short axis on image 51/2, unchanged. - Portacaval node 1.0 cm in short axis on image 55/2, previously 1.1 cm. - Right common iliac node measures 0.6 cm in short axis on image 75/2, unchanged. Reproductive: Leiomyomatous uterus with stable appearance of the bilateral adnexa. Other: No abdominopelvic ascites. Musculoskeletal: Levoconvex curvature of the lumbar spine. Multilevel degenerative changes spine. Degenerative grade 1 retrolisthesis at L1-2 and L2-3 with grade 1 anterolisthesis at L4-5. IMPRESSION: 1. Stable disease in the chest abdomen and pelvis. 2. Similar thickening of the terminal appendix which is of uncertain significance, and once again small appendiceal mass is not readily excluded. 3. Sigmoid colonic  diverticulosis without findings of acute diverticulitis. 4. Similar mild peripheral reticulonodular opacities in the bilateral lungs which again are nonspecific but likely inflammatory. 5. Aortic atherosclerosis. Aortic Atherosclerosis (ICD10-I70.0). Electronically Signed   By: Dahlia Bailiff MD   On: 11/26/2020 11:42     Assessment and Plan-  Reviewed CBC, CMP, and recent CT, plan to continue patient on 420mg  Imbruvica (ibrutinib).   Overall, Ms. Skiff continues to feel well.   Oral Chemotherapy Side Effect/Intolerance:  -Reviewed potential side effects with Ms. Quizon (diarrhea, edema, rash, nausea, fatigue) she did not endorse any side effects.  Oral Chemotherapy Adherence: one missed dose reported  Medication Access Issues:  no issues, fills at [place]  Patient expressed understanding and was in agreement with this plan. She also understands that She can call clinic at any time with any questions, concerns, or complaints.   Thank you for allowing me to participate in the care of this very pleasant patient.   Time Total: 10 mins  Visit consisted of counseling and education on dealing with issues of symptom management in the setting of serious and potentially life-threatening illness.Greater than 50%  of this time was spent counseling and coordinating care related to the above assessment and plan.  Signed by: Darl Pikes, PharmD, BCPS, Salley Slaughter, CPP Hematology/Oncology Clinical Pharmacist Practitioner [facility] [phone]  11/29/2020 3:01 PM

## 2020-12-03 ENCOUNTER — Other Ambulatory Visit: Payer: Self-pay | Admitting: Family Medicine

## 2020-12-03 DIAGNOSIS — I10 Essential (primary) hypertension: Secondary | ICD-10-CM

## 2020-12-11 ENCOUNTER — Other Ambulatory Visit (HOSPITAL_COMMUNITY): Payer: Self-pay

## 2020-12-12 ENCOUNTER — Other Ambulatory Visit (HOSPITAL_COMMUNITY): Payer: Self-pay

## 2020-12-12 ENCOUNTER — Other Ambulatory Visit: Payer: Self-pay | Admitting: Oncology

## 2020-12-13 ENCOUNTER — Other Ambulatory Visit (HOSPITAL_COMMUNITY): Payer: Self-pay

## 2020-12-17 ENCOUNTER — Other Ambulatory Visit (HOSPITAL_COMMUNITY): Payer: Self-pay

## 2020-12-18 ENCOUNTER — Other Ambulatory Visit: Payer: Self-pay | Admitting: Oncology

## 2020-12-18 ENCOUNTER — Other Ambulatory Visit (HOSPITAL_COMMUNITY): Payer: Self-pay

## 2020-12-18 MED ORDER — IMBRUVICA 420 MG PO TABS
1.0000 | ORAL_TABLET | Freq: Every day | ORAL | 5 refills | Status: DC
  Filled 2020-12-18: qty 28, 28d supply, fill #0
  Filled 2021-01-07: qty 28, 28d supply, fill #1
  Filled 2021-02-13: qty 28, 28d supply, fill #2
  Filled 2021-03-12: qty 28, 28d supply, fill #3
  Filled 2021-04-07: qty 28, 28d supply, fill #4
  Filled 2021-05-06: qty 28, 28d supply, fill #5

## 2020-12-21 ENCOUNTER — Other Ambulatory Visit (HOSPITAL_COMMUNITY): Payer: Self-pay

## 2020-12-23 ENCOUNTER — Other Ambulatory Visit (HOSPITAL_COMMUNITY): Payer: Self-pay

## 2020-12-23 ENCOUNTER — Telehealth: Payer: Self-pay | Admitting: Pharmacy Technician

## 2020-12-26 ENCOUNTER — Other Ambulatory Visit (HOSPITAL_BASED_OUTPATIENT_CLINIC_OR_DEPARTMENT_OTHER): Payer: Self-pay

## 2020-12-26 ENCOUNTER — Other Ambulatory Visit (HOSPITAL_COMMUNITY): Payer: Self-pay

## 2020-12-27 ENCOUNTER — Other Ambulatory Visit (HOSPITAL_COMMUNITY): Payer: Self-pay

## 2020-12-27 NOTE — Telephone Encounter (Signed)
Oral Oncology Patient Advocate Encounter   Was successful in securing patient a $ 10,000 grant from [employer] [place] ([employer]) to provide copayment coverage for Imbruvica.  This will keep the out of pocket expense at $0.     I will call the patient to let them know of the approval.  The billing information is as follows and has been shared with [facility].   Member ID: [id number] Group ID: [id number] RxBin: [id number] PCN: [id number] Dates of Eligibility: 09/24/20 through 12/23/21  Fund:  [place] Patient [employer] Phone [phone] Fax [phone] 12/27/2020 3:36 PM

## 2021-01-07 ENCOUNTER — Other Ambulatory Visit (HOSPITAL_COMMUNITY): Payer: Self-pay

## 2021-01-14 ENCOUNTER — Other Ambulatory Visit (HOSPITAL_COMMUNITY): Payer: Self-pay

## 2021-01-17 ENCOUNTER — Telehealth: Payer: Self-pay | Admitting: *Deleted

## 2021-01-17 NOTE — Chronic Care Management (AMB) (Signed)
  Chronic Care Management   Note  01/17/2021 Name: Barbara Chambers MRN: [id number] DOB: 18-Feb-1935  Barbara Chambers is a 86 y.o. year old female who is a primary care patient of [name], [name], MD. I reached out to [name] by phone today in response to a referral sent by [name]'s PCP [name], MD     [name] was given information about Chronic Care Management services today including:  CCM service includes personalized support from designated clinical staff supervised by her physician, including individualized plan of care and coordination with other care providers 24/7 contact phone numbers for assistance for urgent and routine care needs. Service will only be billed when office clinical staff spend 20 minutes or more in a month to coordinate care. Only one practitioner may furnish and bill the service in a calendar month. The patient may stop CCM services at any time (effective at the end of the month) by phone call to the office staff. The patient will be responsible for cost sharing (co-pay) of up to 20% of the service fee (after annual deductible is met).  Patient agreed to services and verbal consent obtained.   Follow up plan: Telephone appointment with care management team member scheduled for: 02/07/2021  Julian Hy, [place] [facility]  Direct Dial: [phone]

## 2021-01-29 ENCOUNTER — Other Ambulatory Visit (HOSPITAL_COMMUNITY): Payer: Self-pay

## 2021-02-07 ENCOUNTER — Ambulatory Visit (INDEPENDENT_AMBULATORY_CARE_PROVIDER_SITE_OTHER): Payer: PPO

## 2021-02-07 ENCOUNTER — Other Ambulatory Visit (HOSPITAL_COMMUNITY): Payer: Self-pay

## 2021-02-07 DIAGNOSIS — I10 Essential (primary) hypertension: Secondary | ICD-10-CM

## 2021-02-07 DIAGNOSIS — E782 Mixed hyperlipidemia: Secondary | ICD-10-CM

## 2021-02-07 NOTE — Chronic Care Management (AMB) (Signed)
Chronic Care Management   CCM RN Visit Note  02/07/2021 Name: Barbara Chambers MRN: [id number] DOB: 08-23-1934  Subjective: Barbara Chambers is a 86 y.o. year old female who is a primary care patient of [name], [name], MD. The care management team was consulted for assistance with disease management and care coordination needs.    Engaged with patient by telephone for initial visit in response to provider referral for case management and care coordination services.   Consent to Services:  The patient was given the following information about Chronic Care Management services: 1. CCM service includes personalized support from designated clinical staff supervised by the primary care provider, including individualized plan of care and coordination with other care providers 2. 24/7 contact phone numbers for assistance for urgent and routine care needs. 3. Service will only be billed when office clinical staff spend 20 minutes or more in a month to coordinate care. 4. Only one practitioner may furnish and bill the service in a calendar month. 5.The patient may stop CCM services at any time (effective at the end of the month) by phone call to the office staff. 6. The patient will be responsible for cost sharing (co-pay) of up to 20% of the service fee (after annual deductible is met). Patient agreed to services and consent obtained.  Assessment: Review of patient past medical history, allergies, medications, health status, including review of consultants reports, laboratory and other test data, was performed as part of comprehensive evaluation and provision of chronic care management services.   SDOH (Social Determinants of Health) assessments and interventions performed:  SDOH Interventions    Flowsheet Row Most Recent Value  SDOH Interventions   Food Insecurity Interventions Intervention Not Indicated  Transportation Interventions Intervention Not Indicated        CCM Care Plan  Allergies   Allergen Reactions   Hctz [Hydrochlorothiazide]     Hyponatremia   Penicillins Rash    Outpatient Encounter Medications as of 02/07/2021  Medication Sig Note   amLODipine (NORVASC) 5 MG tablet TAKE 1 TABLET BY MOUTH EVERY DAY    atorvastatin (LIPITOR) 40 MG tablet Take 40 mg by mouth daily.    COENZYME Q-10 PO Take 200 mg by mouth daily.     DULoxetine (CYMBALTA) 20 MG capsule TAKE 1 CAPSULE BY MOUTH EVERY DAY    furosemide (LASIX) 20 MG tablet Take 20 mg by mouth daily.    ibrutinib (IMBRUVICA) 420 MG TABS Take 1 tablet by mouth daily.    metoprolol succinate (TOPROL-XL) 100 MG 24 hr tablet TAKE 1 TABLET BY MOUTH DAILY. TAKE WITH OR IMMEDIATELY FOLLOWING A MEAL. 02/07/2021: Reports taking 1/2 tablet.   OLANZapine (ZYPREXA) 2.5 MG tablet TAKE 1 TABLET BY MOUTH EVERYDAY AT BEDTIME    rivastigmine (EXELON) 1.5 MG capsule Take 1.5 mg by mouth 2 (two) times daily.    telmisartan (MICARDIS) 40 MG tablet Take 40 mg by mouth daily.    vitamin B-12 (CYANOCOBALAMIN) 1000 MCG tablet Take 1,000 mcg by mouth daily.    ibrutinib (IMBRUVICA) 420 MG TABS TAKE 1 TABLET BY MOUTH DAILY.    No facility-administered encounter medications on file as of 02/07/2021.    Patient Active Problem List   Diagnosis Date Noted   Bilateral leg edema 11/09/2019   Atherosclerosis of abdominal aorta ([place]) 11/09/2019   Senile purpura ([place]) 09/28/2019   Cervical adenopathy 09/28/2019   Memory loss 09/28/2019   Actinic keratosis 09/01/2019   Rosacea 09/01/2019   Hyponatremia 03/03/2019  Adjustment disorder with mixed anxiety and depressed mood 03/01/2019   Obsessional thoughts 03/01/2019   Hallucinations 03/01/2019   Shortness of breath 07/11/2018   Vulvar irritation 07/11/2018   Dizziness 05/18/2017   Leg cramps 04/20/2017   Arthritis 03/08/2015   Anxiety 10/25/2014   CLL (chronic lymphocytic leukemia) ([place]) 10/25/2014   HLD (hyperlipidemia) 10/25/2014   Subclinical hypothyroidism 10/25/2014   Abnormal ECG  10/11/2014   Benign essential HTN 10/11/2014    Conditions to be addressed/monitored:HTN and HLD Patient Care Plan: Hypertension and Hyperlipidemia     Problem Identified: Hypertension and Hyperlipidemia      Long-Range Goal: Hypertension and Hyperlipidemia Monitored   Start Date: 02/07/2021  Expected End Date: 05/08/2021  Priority: Medium  Note:   Objective:  Last practice recorded BP readings:  BP Readings from Last 3 Encounters:  11/28/20 (!) 144/70  11/19/20 130/74  08/27/20 133/84   Most recent eGFR/CrCl: No results found for: EGFR  No components found for: CRCL  Lab Results  Component Value Date   CHOL 179 09/28/2019   HDL 50 09/28/2019   LDLCALC 108 (H) 09/28/2019   TRIG 119 09/28/2019   CHOLHDL 3.6 09/28/2019     Current Barriers:  Chronic Disease Management support and educational needs related to HTN and Hyperlipidemia.   Case Manager Clinical Goal(s):  Over the next 90 days, patient will demonstrate improved adherence to prescribed treatment plan as evidenced by taking all medications as prescribed, monitoring and recording blood pressure as directed and adhering to low sodium/DASH diet   Interventions:  Collaboration with [name], [name], MD regarding development and update of comprehensive plan of care as evidenced by provider attestation and co-signature Reviewed medications.  Advised to take medications as prescribed. Advised to notify provider if unable to tolerate prescribed regimen. Provided information regarding established blood pressure parameters along with indications for notifying a provider. Encouraged to monitor and record readings. Reports recent readings have been within range. Wears compression hose to decrease ankle edema.  Discussed compliance with recommended cardiac prudent diet. Encouraged to read nutrition labels, monitor sodium intake and avoid highly processed foods when possible. Reviewed s/sx of heart attack, stroke and  worsening symptoms that require immediate medical attention.  Patient Goals/Self-Care Activities: Self-administer medications as prescribed Monitor and record blood pressure Adhere to recommended cardiac prudent/heart healthy diet Notify provider or care management team with questions and new concerns as needed    Follow Up Plan:  Will follow up in two months          PLAN A member of the care management team will follow up with Barbara Chambers in two months.    Cristy [facility] Care Management [facility] [phone]

## 2021-02-07 NOTE — Patient Instructions (Addendum)
Thank you for allowing the Chronic Care Management team to participate in your care.   Goals Addressed: Patient Care Plan: Hypertension and Hyperlipidemia     Problem Identified: Hypertension and Hyperlipidemia      Long-Range Goal: Hypertension and Hyperlipidemia Monitored   Start Date: 02/07/2021  Expected End Date: 05/08/2021  Priority: Medium  Note:   Objective:  Last practice recorded BP readings:  BP Readings from Last 3 Encounters:  11/28/20 (!) 144/70  11/19/20 130/74  08/27/20 133/84   Most recent eGFR/CrCl: No results found for: EGFR  No components found for: CRCL  Lab Results  Component Value Date   CHOL 179 09/28/2019   HDL 50 09/28/2019   LDLCALC 108 (H) 09/28/2019   TRIG 119 09/28/2019   CHOLHDL 3.6 09/28/2019     Current Barriers:  Chronic Disease Management support and educational needs related to HTN and Hyperlipidemia.   Case Manager Clinical Goal(s):  Over the next 90 days, patient will demonstrate improved adherence to prescribed treatment plan as evidenced by taking all medications as prescribed, monitoring and recording blood pressure as directed and adhering to low sodium/DASH diet   Interventions:  Collaboration with [name], [name], MD regarding development and update of comprehensive plan of care as evidenced by provider attestation and co-signature Reviewed medications.  Advised to take medications as prescribed. Advised to notify provider if unable to tolerate prescribed regimen. Provided information regarding established blood pressure parameters along with indications for notifying a provider. Encouraged to monitor and record readings. Reports recent readings have been within range. Wears compression hose to decrease ankle edema.  Discussed compliance with recommended cardiac prudent diet. Encouraged to read nutrition labels, monitor sodium intake and avoid highly processed foods when possible. Reviewed s/sx of heart attack, stroke and  worsening symptoms that require immediate medical attention.  Patient Goals/Self-Care Activities: Self-administer medications as prescribed Monitor and record blood pressure Adhere to recommended cardiac prudent/heart healthy diet Notify provider or care management team with questions and new concerns as needed    Follow Up Plan:  Will follow up in two months          Mrs. Giambra verbalized understanding of the information discussed during the telephonic outreach. Declined need for mailed/printed instructions. A member of the care management team will follow up in two months.    [facility]risty [facility] Care Management [facility] [phone]

## 2021-02-13 ENCOUNTER — Other Ambulatory Visit (HOSPITAL_COMMUNITY): Payer: Self-pay

## 2021-02-22 ENCOUNTER — Other Ambulatory Visit: Payer: Self-pay | Admitting: Family Medicine

## 2021-02-26 ENCOUNTER — Other Ambulatory Visit: Payer: Self-pay

## 2021-02-26 DIAGNOSIS — I10 Essential (primary) hypertension: Secondary | ICD-10-CM | POA: Diagnosis not present

## 2021-02-26 DIAGNOSIS — C9110 Chronic lymphocytic leukemia of B-cell type not having achieved remission: Secondary | ICD-10-CM

## 2021-02-26 DIAGNOSIS — E782 Mixed hyperlipidemia: Secondary | ICD-10-CM | POA: Diagnosis not present

## 2021-02-27 ENCOUNTER — Encounter: Payer: Self-pay | Admitting: Pharmacist

## 2021-02-27 ENCOUNTER — Inpatient Hospital Stay: Payer: PPO | Attending: Oncology

## 2021-02-27 ENCOUNTER — Other Ambulatory Visit: Payer: Self-pay

## 2021-02-27 ENCOUNTER — Inpatient Hospital Stay: Payer: PPO | Admitting: Pharmacist

## 2021-02-27 VITALS — BP 116/62 | HR 80 | Temp 98.3°F | Resp 16 | Wt 132.5 lb

## 2021-02-27 DIAGNOSIS — Z79899 Other long term (current) drug therapy: Secondary | ICD-10-CM | POA: Insufficient documentation

## 2021-02-27 DIAGNOSIS — C9110 Chronic lymphocytic leukemia of B-cell type not having achieved remission: Secondary | ICD-10-CM

## 2021-02-27 LAB — CBC WITH DIFFERENTIAL/PLATELET
Abs Immature Granulocytes: 0.05 10*3/uL (ref 0.00–0.07)
Basophils Absolute: 0.1 10*3/uL (ref 0.0–0.1)
Basophils Relative: 1 %
Eosinophils Absolute: 0.2 10*3/uL (ref 0.0–0.5)
Eosinophils Relative: 1 %
HCT: 38.3 % (ref 36.0–46.0)
Hemoglobin: 12 g/dL (ref 12.0–15.0)
Immature Granulocytes: 0 %
Lymphocytes Relative: 52 %
Lymphs Abs: 7.1 10*3/uL — ABNORMAL HIGH (ref 0.7–4.0)
MCH: 24.3 pg — ABNORMAL LOW (ref 26.0–34.0)
MCHC: 31.3 g/dL (ref 30.0–36.0)
MCV: 77.7 fL — ABNORMAL LOW (ref 80.0–100.0)
Monocytes Absolute: 1 10*3/uL (ref 0.1–1.0)
Monocytes Relative: 7 %
Neutro Abs: 5.3 10*3/uL (ref 1.7–7.7)
Neutrophils Relative %: 39 %
Platelets: 278 10*3/uL (ref 150–400)
RBC: 4.93 MIL/uL (ref 3.87–5.11)
RDW: 16.3 % — ABNORMAL HIGH (ref 11.5–15.5)
Smear Review: NORMAL
WBC: 13.6 10*3/uL — ABNORMAL HIGH (ref 4.0–10.5)
nRBC: 0 % (ref 0.0–0.2)

## 2021-02-27 LAB — COMPREHENSIVE METABOLIC PANEL
ALT: 16 U/L (ref 0–44)
AST: 24 U/L (ref 15–41)
Albumin: 3.9 g/dL (ref 3.5–5.0)
Alkaline Phosphatase: 95 U/L (ref 38–126)
Anion gap: 9 (ref 5–15)
BUN: 22 mg/dL (ref 8–23)
CO2: 26 mmol/L (ref 22–32)
Calcium: 8.8 mg/dL — ABNORMAL LOW (ref 8.9–10.3)
Chloride: 105 mmol/L (ref 98–111)
Creatinine, Ser: 1.12 mg/dL — ABNORMAL HIGH (ref 0.44–1.00)
GFR, Estimated: 48 mL/min — ABNORMAL LOW (ref >60)
Glucose, Bld: 87 mg/dL (ref 70–99)
Potassium: 4.3 mmol/L (ref 3.5–5.1)
Sodium: 140 mmol/L (ref 135–145)
Total Bilirubin: 0.5 mg/dL (ref 0.3–1.2)
Total Protein: 6.9 g/dL (ref 6.5–8.1)

## 2021-02-27 NOTE — Progress Notes (Signed)
Patient reports she is not taking Imbruvica.

## 2021-02-27 NOTE — Progress Notes (Signed)
[place]  Telephone:([phone] [phone]  Patient Care Team: [name], MD as PCP - General (Family Medicine) Lloyd Huger, MD as Consulting Physician (Oncology) Corey Skains, MD as Consulting Physician (Cardiology) Birder Robson, MD as Referring Physician (Ophthalmology) Neldon Labella, RN as Case Manager   Name of the patient: Barbara Chambers  CZ:[id number]  1935-01-09   Date of visit: 02/27/21  HPI: Patient is a 86 y.o. female with chronic lymphocytic leukemia. Currently treated with Imbruvica (ibrutinib). Started on 11/23/19.  Reason for Consult: Oral chemotherapy follow-up for Imbruvica (ibrutinib) therapy.   PAST MEDICAL HISTORY: Past Medical History:  Diagnosis Date   Chronic lymphocytic leukemia ([place]) 2011   Hyperlipidemia    Hypertension     PAST SURGICAL HISTORY:  Past Surgical History:  Procedure Laterality Date   cataract surgery Bilateral 2005   CHOLECYSTECTOMY  1991   RECONSTRUCTION OF EYELID      HEMATOLOGY/ONCOLOGY HISTORY:  Oncology History   No history exists.    ALLERGIES:  is allergic to hctz [hydrochlorothiazide] and penicillins.  MEDICATIONS:  Current Outpatient Medications  Medication Sig Dispense Refill   amLODipine (NORVASC) 5 MG tablet TAKE 1 TABLET BY MOUTH EVERY DAY 90 tablet 1   atorvastatin (LIPITOR) 40 MG tablet Take 40 mg by mouth daily.     COENZYME Q-10 PO Take 200 mg by mouth daily.      DULoxetine (CYMBALTA) 20 MG capsule TAKE 1 CAPSULE BY MOUTH EVERY DAY 90 capsule 0   furosemide (LASIX) 20 MG tablet Take 20 mg by mouth daily.     ibrutinib (IMBRUVICA) 420 MG TABS Take 1 tablet by mouth daily. 28 tablet 5   ibrutinib (IMBRUVICA) 420 MG TABS TAKE 1 TABLET BY MOUTH DAILY. 28 tablet 5   metoprolol succinate (TOPROL-XL) 100 MG 24 hr tablet TAKE 1 TABLET BY MOUTH DAILY. TAKE WITH OR IMMEDIATELY FOLLOWING A MEAL. 90 tablet 1   OLANZapine (ZYPREXA)  2.5 MG tablet TAKE 1 TABLET BY MOUTH EVERYDAY AT BEDTIME 90 tablet 1   rivastigmine (EXELON) 1.5 MG capsule Take 1.5 mg by mouth 2 (two) times daily.     telmisartan (MICARDIS) 40 MG tablet Take 40 mg by mouth daily.     vitamin B-12 (CYANOCOBALAMIN) 1000 MCG tablet Take 1,000 mcg by mouth daily.     No current facility-administered medications for this visit.    VITAL SIGNS: There were no vitals taken for this visit. There were no vitals filed for this visit.  Estimated body mass index is 27.47 kg/m as calculated from the following:   Height as of 11/19/20: '4\' 11"'$  (1.499 m).   Weight as of 11/28/20: 61.7 kg (136 lb).  LABS: CBC:    Component Value Date/Time   WBC 13.4 (H) 11/28/2020 1411   HGB 11.6 (L) 11/28/2020 1411   HGB 12.9 09/28/2019 0936   HCT 36.5 11/28/2020 1411   HCT 41.2 09/28/2019 0936   PLT 263 11/28/2020 1411   PLT 262 09/28/2019 0936   MCV 80.6 11/28/2020 1411   MCV 87 09/28/2019 0936   MCV 84 03/19/2014 1000   NEUTROABS 4.7 11/28/2020 1411   NEUTROABS 6.1 03/01/2019 1050   NEUTROABS 4.9 03/19/2014 1000   LYMPHSABS 8.2 (H) 11/28/2020 1411   LYMPHSABS 17.1 (H) 03/01/2019 1050   LYMPHSABS 8.1 (H) 03/19/2014 1000   MONOABS 0.5 11/28/2020 1411   MONOABS 0.6 03/19/2014 1000   EOSABS 0.0 11/28/2020 1411   EOSABS 0.0 03/01/2019 1050  EOSABS 0.1 03/19/2014 1000   BASOSABS 0.0 11/28/2020 1411   BASOSABS 0.0 03/01/2019 1050   BASOSABS 0.2 (H) 03/19/2014 1000   Comprehensive Metabolic Panel:    Component Value Date/Time   NA 139 11/28/2020 1411   NA 141 09/28/2019 0936   K 3.4 (L) 11/28/2020 1411   CL 101 11/28/2020 1411   CO2 28 11/28/2020 1411   BUN 15 11/28/2020 1411   BUN 16 09/28/2019 0936   CREATININE 0.77 11/28/2020 1411   CREATININE 0.78 04/20/2017 0914   GLUCOSE 116 (H) 11/28/2020 1411   CALCIUM 8.8 (L) 11/28/2020 1411   AST 27 11/28/2020 1411   AST 25 02/15/2012 1101   ALT 18 11/28/2020 1411   ALT 27 02/15/2012 1101   ALKPHOS 87 11/28/2020  1411   ALKPHOS 89 02/15/2012 1101   BILITOT 0.6 11/28/2020 1411   BILITOT 0.4 09/28/2019 0936   BILITOT 0.4 02/15/2012 1101   PROT 6.5 11/28/2020 1411   PROT 6.2 09/28/2019 0936   PROT 6.9 02/15/2012 1101   ALBUMIN 3.7 11/28/2020 1411   ALBUMIN 4.1 09/28/2019 0936   ALBUMIN 3.7 02/15/2012 1101    RADIOGRAPHIC STUDIES: No results found.  Present during visit: patient only  Assessment and Plan:  Reviewed CBC/CMP, plan to continue patient on '420mg'$  ibrutinib.   Overall, Barbara Chambers continues to feel well.   Oral Chemotherapy Side Effect/Intolerance:  No reported diarrhea or constipation, edema, headache, rash, nausea, fatigue, mouth sores  Other:  Rash- Patient has a mild rash on her arms on her arms, she reported showing it to other providers who were not sure the cause. Suggested she try some OTC hydrocortisone cream, starting with a small area to see if that helps to improve the rash She is able [place]. She enjoys those trips and is heading up there this weekend  Oral Chemotherapy Adherence: one missed dose reported  Medication Access Issues:  no issues, fills at [facility]  Patient expressed understanding and was in agreement with this plan. She also understands that She can call clinic at any time with any questions, concerns, or complaints.   Follow-up: F/u with MD in 3 months, already scheduled  Thank you for allowing me to participate in the care of this very pleasant patient.   Time Total: 15 mins  Visit consisted of counseling and education on dealing with issues of symptom management in the setting of serious and potentially life-threatening illness.Greater than 50%  of this time was spent counseling and coordinating care related to the above assessment and plan.  Signed by: Darl Pikes, PharmD, BCPS, Salley Slaughter, CPP Hematology/Oncology Clinical Pharmacist Practitioner [facility] [phone]  02/27/2021 11:08 AM

## 2021-03-03 ENCOUNTER — Other Ambulatory Visit: Payer: Self-pay | Admitting: Family Medicine

## 2021-03-03 DIAGNOSIS — B89 Unspecified parasitic disease: Secondary | ICD-10-CM

## 2021-03-04 NOTE — Telephone Encounter (Signed)
Requested medication (s) are due for refill today: yes  Requested medication (s) are on the active medication list: yes  Last refill:  09/10/20 #90 with 1 refill  Future visit scheduled: yes  Notes to clinic:  Please review for refill. Refill not delegated per protocol    Requested Prescriptions  Pending Prescriptions Disp Refills   OLANZapine (ZYPREXA) 2.5 MG tablet [Pharmacy Med Name: OLANZAPINE 2.5 MG TABLET] 90 tablet 1    Sig: TAKE 1 TABLET BY MOUTH EVERYDAY AT BEDTIME     Not Delegated - Psychiatry:  Antipsychotics - Second Generation (Atypical) - olanzapine Failed - 03/03/2021  8:22 AM      Failed - This refill cannot be delegated      Passed - ALT in normal range and within 360 days    ALT  Date Value Ref Range Status  02/27/2021 16 0 - 44 U/L Final   SGPT (ALT)  Date Value Ref Range Status  02/15/2012 27 12 - 78 U/L Final          Passed - AST in normal range and within 360 days    AST  Date Value Ref Range Status  02/27/2021 24 15 - 41 U/L Final   SGOT(AST)  Date Value Ref Range Status  02/15/2012 25 15 - 37 Unit/L Final          Passed - Last BP in normal range    BP Readings from Last 1 Encounters:  02/27/21 116/62          Passed - Valid encounter within last 6 months    Recent Outpatient Visits           3 months ago Encounter for annual physical exam   [facility] [place], [name], MD   9 months ago Benign essential HTN   [facility] [place], [name], MD   1 year ago Benign essential HTN   [facility] [place], [name], MD   1 year ago Encounter for annual physical exam   [facility] [place], [name], MD   1 year ago [place], [name], MD       Future Appointments             In 2 months [name], [name], MD [facility], [place]

## 2021-03-12 ENCOUNTER — Other Ambulatory Visit (HOSPITAL_COMMUNITY): Payer: Self-pay

## 2021-03-13 ENCOUNTER — Other Ambulatory Visit (HOSPITAL_COMMUNITY): Payer: Self-pay

## 2021-03-30 ENCOUNTER — Other Ambulatory Visit: Payer: Self-pay | Admitting: Family Medicine

## 2021-04-07 ENCOUNTER — Other Ambulatory Visit (HOSPITAL_COMMUNITY): Payer: Self-pay

## 2021-04-09 ENCOUNTER — Other Ambulatory Visit (HOSPITAL_COMMUNITY): Payer: Self-pay

## 2021-04-09 DIAGNOSIS — I700 Atherosclerosis of aorta: Secondary | ICD-10-CM | POA: Diagnosis not present

## 2021-04-09 DIAGNOSIS — R600 Localized edema: Secondary | ICD-10-CM | POA: Diagnosis not present

## 2021-04-09 DIAGNOSIS — I10 Essential (primary) hypertension: Secondary | ICD-10-CM | POA: Diagnosis not present

## 2021-04-11 ENCOUNTER — Ambulatory Visit (INDEPENDENT_AMBULATORY_CARE_PROVIDER_SITE_OTHER): Payer: PPO

## 2021-04-11 DIAGNOSIS — E782 Mixed hyperlipidemia: Secondary | ICD-10-CM

## 2021-04-11 DIAGNOSIS — I10 Essential (primary) hypertension: Secondary | ICD-10-CM

## 2021-04-11 NOTE — Patient Instructions (Addendum)
Thank you for allowing the Chronic Care Management team to participate in your care.    Patient Care Plan: Hypertension and Hyperlipidemia     Problem Identified: Hypertension and Hyperlipidemia      Long-Range Goal: Hypertension and Hyperlipidemia Monitored   Start Date: 02/07/2021  Expected End Date: 05/08/2021  Priority: Medium  Note:   Objective:  Last practice recorded BP readings:  BP Readings from Last 3 Encounters:  11/28/20 (!) 144/70  11/19/20 130/74  08/27/20 133/84   Most recent eGFR/CrCl: No results found for: EGFR  No components found for: CRCL  Lab Results  Component Value Date   CHOL 179 09/28/2019   HDL 50 09/28/2019   LDLCALC 108 (H) 09/28/2019   TRIG 119 09/28/2019   CHOLHDL 3.6 09/28/2019     Current Barriers:  Chronic Disease Management support and educational needs related to HTN and Hyperlipidemia.   Case Manager Clinical Goal(s):  Over the next 90 days, patient will demonstrate improved adherence to prescribed treatment plan as evidenced by taking all medications as prescribed, monitoring and recording blood pressure as directed and adhering to low sodium/DASH diet   Interventions:  Collaboration with [name], [name], MD regarding development and update of comprehensive plan of care as evidenced by provider attestation and co-signature Reviewed medications. Reports her medications were recently adjusted by the Cardiology team. Reports managing well and taking as prescribed.  Discussed BP readings and reviewed established blood pressure parameters. Reports recent readings have been within range. Reading today was 138/60. Reviewed symptoms. Denies episodes of chest discomfort or palpitations. Denies headaches or lightheadedness. She continues to experience bilateral lower extremity edema. Notes right extremity tends to swell more. She is wearing compression hose as advised. Denies change or decline in activity tolerance.  Discussed nutritional  intake. Continues to adhere to a low sodium/cardiac prudent diet. Declines need for additional nutritional resources. Reviewed s/sx of heart attack, stroke and worsening symptoms that require immediate medical attention.   Patient Goals/Self-Care Activities: Self-administer medications as prescribed Complete Cardiology follow up as scheduled Monitor and record blood pressure Adhere to recommended cardiac prudent/heart healthy diet Notify provider or care management team with questions and new concerns as needed    Follow Up Plan:  Will follow up next month         Mrs. Tirpak verbalized understanding of the information discussed during the telephonic outreach. Declined need for mailed/printed instructions. A member of the care management team will follow up next month.    [facility]risty [facility] Care Management [facility] [phone]

## 2021-04-11 NOTE — Chronic Care Management (AMB) (Signed)
Chronic Care Management   CCM RN Visit Note  04/11/2021 Name: Barbara Chambers MRN: [id number] DOB: 1935-03-19  Subjective: Barbara Chambers is a 86 y.o. year old female who is a primary care patient of [name], [name], MD. The care management team was consulted for assistance with disease management and care coordination needs.    Engaged with patient by telephone for follow up visit in response to provider referral for case management and/or care coordination services.   Consent to Services:  The patient was given information about Chronic Care Management services, agreed to services, and gave verbal consent prior to initiation of services.  Please see initial visit note for detailed documentation.   Patient agreed to services and verbal consent obtained.   Assessment: Review of patient past medical history, allergies, medications, health status, including review of consultants reports, laboratory and other test data, was performed as part of comprehensive evaluation and provision of chronic care management services.   SDOH (Social Determinants of Health) assessments and interventions performed:    CCM Care Plan  Allergies  Allergen Reactions   Hctz [Hydrochlorothiazide]     Hyponatremia   Penicillins Rash    Outpatient Encounter Medications as of 04/11/2021  Medication Sig Note   amLODipine (NORVASC) 5 MG tablet TAKE 1 TABLET BY MOUTH EVERY DAY    atorvastatin (LIPITOR) 40 MG tablet Take 40 mg by mouth daily.    COENZYME Q-10 PO Take 200 mg by mouth daily.     DULoxetine (CYMBALTA) 20 MG capsule TAKE 1 CAPSULE BY MOUTH EVERY DAY    furosemide (LASIX) 20 MG tablet Take 20 mg by mouth daily.    ibrutinib (IMBRUVICA) 420 MG TABS TAKE 1 TABLET BY MOUTH DAILY.    metoprolol succinate (TOPROL-XL) 100 MG 24 hr tablet TAKE 1 TABLET BY MOUTH DAILY. TAKE WITH OR IMMEDIATELY FOLLOWING A MEAL. 02/07/2021: Reports taking 1/2 tablet.   OLANZapine (ZYPREXA) 2.5 MG tablet TAKE 1 TABLET BY  MOUTH EVERYDAY AT BEDTIME    rivastigmine (EXELON) 1.5 MG capsule Take 1.5 mg by mouth 2 (two) times daily.    telmisartan (MICARDIS) 40 MG tablet Take 40 mg by mouth daily.    vitamin B-12 (CYANOCOBALAMIN) 1000 MCG tablet Take 1,000 mcg by mouth daily.    No facility-administered encounter medications on file as of 04/11/2021.    Patient Active Problem List   Diagnosis Date Noted   Bilateral leg edema 11/09/2019   Atherosclerosis of abdominal aorta (HCC) 11/09/2019   Senile purpura ([place]) 09/28/2019   Cervical adenopathy 09/28/2019   Memory loss 09/28/2019   Actinic keratosis 09/01/2019   Rosacea 09/01/2019   Hyponatremia 03/03/2019   Adjustment disorder with mixed anxiety and depressed mood 03/01/2019   Obsessional thoughts 03/01/2019   Hallucinations 03/01/2019   Shortness of breath 07/11/2018   Vulvar irritation 07/11/2018   Dizziness 05/18/2017   Leg cramps 04/20/2017   Arthritis 03/08/2015   Anxiety 10/25/2014   CLL (chronic lymphocytic leukemia) ([place]) 10/25/2014   HLD (hyperlipidemia) 10/25/2014   Subclinical hypothyroidism 10/25/2014   Abnormal ECG 10/11/2014   Benign essential HTN 10/11/2014    Conditions to be addressed/monitored:HTN and HLD Patient Care Plan: Hypertension and Hyperlipidemia     Problem Identified: Hypertension and Hyperlipidemia      Long-Range Goal: Hypertension and Hyperlipidemia Monitored   Start Date: 02/07/2021  Expected End Date: 05/08/2021  Priority: Medium  Note:   Objective:  Last practice recorded BP readings:  BP Readings from Last 3 Encounters:  11/28/20 [name])  144/70  11/19/20 130/74  08/27/20 133/84   Most recent eGFR/CrCl: No results found for: EGFR  No components found for: CRCL  Lab Results  Component Value Date   CHOL 179 09/28/2019   HDL 50 09/28/2019   LDLCALC 108 (H) 09/28/2019   TRIG 119 09/28/2019   CHOLHDL 3.6 09/28/2019     Current Barriers:  Chronic Disease Management support and educational needs  related to HTN and Hyperlipidemia.   Case Manager Clinical Goal(s):  Over the next 90 days, patient will demonstrate improved adherence to prescribed treatment plan as evidenced by taking all medications as prescribed, monitoring and recording blood pressure as directed and adhering to low sodium/DASH diet   Interventions:  Collaboration with [name], [name], MD regarding development and update of comprehensive plan of care as evidenced by provider attestation and co-signature Reviewed medications. Reports her medications were recently adjusted by the Cardiology team. Reports managing well and taking as prescribed.  Discussed BP readings and reviewed established blood pressure parameters. Reports recent readings have been within range. Reading today was 138/60. Reviewed symptoms. Denies episodes of chest discomfort or palpitations. Denies headaches or lightheadedness. She continues to experience bilateral lower extremity edema. Notes right extremity tends to swell more. She is wearing compression hose as advised. Denies change or decline in activity tolerance.  Discussed nutritional intake. Continues to adhere to a low sodium/cardiac prudent diet. Declines need for additional nutritional resources. Reviewed s/sx of heart attack, stroke and worsening symptoms that require immediate medical attention.   Patient Goals/Self-Care Activities: Self-administer medications as prescribed Complete Cardiology follow up as scheduled Monitor and record blood pressure Adhere to recommended cardiac prudent/heart healthy diet Notify provider or care management team with questions and new concerns as needed    Follow Up Plan:  Will follow up next month        PLAN: A member of the care management team will follow up next month.    [facility] Care Management [facility] [phone]

## 2021-04-24 ENCOUNTER — Ambulatory Visit: Payer: Self-pay | Admitting: *Deleted

## 2021-04-24 NOTE — Telephone Encounter (Signed)
Noted  

## 2021-04-24 NOTE — Telephone Encounter (Signed)
Reason for Disposition  Systolic BP  >= 295 OR Diastolic >= 747  Answer Assessment - Initial Assessment Questions 1. BLOOD PRESSURE: "What is the blood pressure?" "Did you take at least two measurements 5 minutes apart?"     160/81- lunch, 176/80, 188/79 P 97 2. ONSET: "When did you take your blood pressure?"     12:00, 3:00 3. HOW: "How did you obtain the blood pressure?" (e.g., visiting nurse, automatic home BP monitor)     Automatic cuff- arm 4. HISTORY: "Do you have a history of high blood pressure?"     yes 5. MEDICATIONS: "Are you taking any medications for blood pressure?" "Have you missed any doses recently?"     Yes- no missed dosing 6. OTHER SYMPTOMS: "Do you have any symptoms?" (e.g., headache, chest pain, blurred vision, difficulty breathing, weakness)     no 7. PREGNANCY: "Is there any chance you are pregnant?" "When was your last menstrual period?"     na  Protocols used: Blood Pressure - High-A-AH

## 2021-04-24 NOTE — Telephone Encounter (Signed)
Patient is calling to report she has elevated BP readings. Patient reports no symptoms. Patient has been scheduled for appointment tomorrow am.

## 2021-04-25 ENCOUNTER — Encounter: Payer: Self-pay | Admitting: Family Medicine

## 2021-04-25 ENCOUNTER — Other Ambulatory Visit: Payer: Self-pay

## 2021-04-25 ENCOUNTER — Ambulatory Visit (INDEPENDENT_AMBULATORY_CARE_PROVIDER_SITE_OTHER): Payer: PPO | Admitting: Family Medicine

## 2021-04-25 VITALS — BP 144/59 | HR 66 | Temp 97.5°F | Resp 15 | Wt 133.2 lb

## 2021-04-25 DIAGNOSIS — I10 Essential (primary) hypertension: Secondary | ICD-10-CM

## 2021-04-25 DIAGNOSIS — R413 Other amnesia: Secondary | ICD-10-CM

## 2021-04-25 DIAGNOSIS — F419 Anxiety disorder, unspecified: Secondary | ICD-10-CM

## 2021-04-25 DIAGNOSIS — Z23 Encounter for immunization: Secondary | ICD-10-CM | POA: Insufficient documentation

## 2021-04-25 MED ORDER — TELMISARTAN 40 MG PO TABS: 80.0000 mg | ORAL_TABLET | Freq: Every day | ORAL | 3 refills | Status: DC

## 2021-04-25 NOTE — Assessment & Plan Note (Signed)
Recently elevated >160s Meds were changed by cards Reassurance given home machine matched office equipement

## 2021-04-25 NOTE — Progress Notes (Signed)
Established patient visit   Patient: Barbara Chambers   DOB: 10-17-1934   86 y.o. Female  MRN: [id number] Visit Date: 04/25/2021  Today's healthcare provider: Gwyneth Sprout, FNP   Chief Complaint  Patient presents with   Hypertension   Hyperlipidemia   Subjective    HPI  Hypertension, follow-up  BP Readings from Last 3 Encounters:  04/25/21 (!) 144/59  02/27/21 116/62  11/28/20 (!) 144/70   Wt Readings from Last 3 Encounters:  04/25/21 133 lb 3.2 oz (60.4 kg)  02/27/21 132 lb 8 oz (60.1 kg)  11/28/20 136 lb (61.7 kg)     She was last seen for hypertension 5 months ago.  BP at that visit was 130/74. Management since that visit includes none.  She reports excellent compliance with treatment. She is not having side effects.  She is following a Regular diet. She is exercising. She does not smoke.  Use of agents associated with hypertension: none.   Outside blood pressures are systolic 621308 and diastolic 65H . This morning patient reports b.p at home was 140/61 Symptoms: No chest pain No chest pressure  No palpitations No syncope  No dyspnea No orthopnea  No paroxysmal nocturnal dyspnea Yes lower extremity edema   Pertinent labs: Lab Results  Component Value Date   CHOL 179 09/28/2019   HDL 50 09/28/2019   LDLCALC 108 (H) 09/28/2019   TRIG 119 09/28/2019   CHOLHDL 3.6 09/28/2019   Lab Results  Component Value Date   NA 140 02/27/2021   K 4.3 02/27/2021   CREATININE 1.12 (H) 02/27/2021   GFRNONAA 48 (L) 02/27/2021   GLUCOSE 87 02/27/2021   TSH 4.970 (H) 09/28/2019     The ASCVD Risk score (Arnett DK, et al., 2019) failed to calculate for the following reasons:   The 2019 ASCVD risk score is only valid for ages 34 to 3   ---------------------------------------------------------------------------------------------------  Lipid/Cholesterol, Follow-up  Last lipid panel Other pertinent labs  Lab Results  Component Value Date   CHOL 179 09/28/2019    HDL 50 09/28/2019   LDLCALC 108 (H) 09/28/2019   TRIG 119 09/28/2019   CHOLHDL 3.6 09/28/2019   Lab Results  Component Value Date   ALT 16 02/27/2021   AST 24 02/27/2021   PLT 278 02/27/2021   TSH 4.970 (H) 09/28/2019     She was last seen for this 5 months ago.  Management since that visit includes stop simvastatin and start atorvastatin.  She reports excellent compliance with treatment. She is not having side effects.   Symptoms: No chest pain No chest pressure/discomfort  No dyspnea No lower extremity edema  No numbness or tingling of extremity No orthopnea  No palpitations No paroxysmal nocturnal dyspnea  No speech difficulty No syncope   Current diet: well balanced Current exercise: no regular exercise  The ASCVD Risk score (Arnett DK, et al., 2019) failed to calculate for the following reasons:   The 2019 ASCVD risk score is only valid for ages 77 to 77  ---------------------------------------------------------------------------------------------------     Medications: Outpatient Medications Prior to Visit  Medication Sig   atorvastatin (LIPITOR) 40 MG tablet Take 40 mg by mouth daily.   COENZYME Q-10 PO Take 200 mg by mouth daily.    DULoxetine (CYMBALTA) 20 MG capsule TAKE 1 CAPSULE BY MOUTH EVERY DAY   furosemide (LASIX) 20 MG tablet Take 20 mg by mouth daily.   ibrutinib (IMBRUVICA) 420 MG TABS TAKE 1 TABLET BY  MOUTH DAILY.   metoprolol succinate (TOPROL-XL) 100 MG 24 hr tablet TAKE 1 TABLET BY MOUTH DAILY. TAKE WITH OR IMMEDIATELY FOLLOWING A MEAL.   OLANZapine (ZYPREXA) 2.5 MG tablet TAKE 1 TABLET BY MOUTH EVERYDAY AT BEDTIME   rivastigmine (EXELON) 1.5 MG capsule Take 1.5 mg by mouth 2 (two) times daily.   vitamin B-12 (CYANOCOBALAMIN) 1000 MCG tablet Take 1,000 mcg by mouth daily.   [DISCONTINUED] amLODipine (NORVASC) 5 MG tablet TAKE 1 TABLET BY MOUTH EVERY DAY   [DISCONTINUED] telmisartan (MICARDIS) 40 MG tablet Take 40 mg by mouth 2 (two) times  daily.   [DISCONTINUED] telmisartan (MICARDIS) 40 MG tablet Take 40 mg by mouth daily.   No facility-administered medications prior to visit.    Review of Systems     Objective    BP (!) 144/59   Pulse 66   Temp (!) 97.5 F (36.4 C) (Temporal)   Resp 15   Wt 133 lb 3.2 oz (60.4 kg)   SpO2 100%   BMI 26.90 kg/m    Physical Exam Vitals and nursing note reviewed.  Constitutional:      General: She is not in acute distress.    Appearance: Normal appearance. She is overweight. She is not ill-appearing, toxic-appearing or diaphoretic.  HENT:     Head: Normocephalic and atraumatic.  Cardiovascular:     Rate and Rhythm: Normal rate and regular rhythm.     Pulses: Normal pulses.     Heart sounds: Normal heart sounds. No murmur heard.   No friction rub. No gallop.  Pulmonary:     Effort: Pulmonary effort is normal. No respiratory distress.     Breath sounds: Normal breath sounds. No stridor. No wheezing, rhonchi or rales.  Chest:     Chest wall: No tenderness.  Abdominal:     General: Bowel sounds are normal.     Palpations: Abdomen is soft.  Musculoskeletal:        General: No swelling, tenderness, deformity or signs of injury. Normal range of motion.     Right lower leg: No edema.     Left lower leg: No edema.  Skin:    General: Skin is warm and dry.     Capillary Refill: Capillary refill takes less than 2 seconds.     Coloration: Skin is not jaundiced or pale.     Findings: No bruising, erythema, lesion or rash.  Neurological:     General: No focal deficit present.     Mental Status: She is alert and oriented to person, place, and time. Mental status is at baseline.     Cranial Nerves: No cranial nerve deficit.     Sensory: No sensory deficit.     Motor: No weakness.     Coordination: Coordination normal.  Psychiatric:        Mood and Affect: Mood normal.        Behavior: Behavior normal.        Thought Content: Thought content normal.        Judgment: Judgment  normal.    Checked home BP machine to BP machine on site- similar readings- reassurance provided.  No results found for any visits on 04/25/21.  Assessment & Plan     Problem List Items Addressed This Visit       Cardiovascular and Mediastinum   Benign essential HTN - Primary    Recently elevated >160s Meds were changed by cards Reassurance given home machine matched office equipement  Relevant Medications   telmisartan (MICARDIS) 40 MG tablet     Other   Anxiety    Elevated BP noted, pt checked BP repeated- raising each time Noted that BP had previously been elevated before causing her son to have to assist her which she did not want to have to do      Memory loss    New AVS med list provided to clarify medication use Continue to monitor      Need for influenza vaccination    Consent reviewed; provided today      Relevant Orders   Flu Vaccine QUAD High Dose(Fluad) (Completed)     Return in about 3 months (around 07/26/2021) for chonic disease management.      Vonna Kotyk, FNP, have reviewed all documentation for this visit. The documentation on 04/25/21 for the exam, diagnosis, procedures, and orders are all accurate and complete.    Gwyneth Sprout, [place] [phone] (phone) [phone] (fax)  [place]

## 2021-04-25 NOTE — Assessment & Plan Note (Signed)
New AVS med list provided to clarify medication use Continue to monitor

## 2021-04-25 NOTE — Assessment & Plan Note (Signed)
Elevated BP noted, pt checked BP repeated- raising each time Noted that BP had previously been elevated before causing her son to have to assist her which she did not want to have to do

## 2021-04-25 NOTE — Assessment & Plan Note (Signed)
Consent reviewed; provided today

## 2021-04-28 DIAGNOSIS — I10 Essential (primary) hypertension: Secondary | ICD-10-CM | POA: Diagnosis not present

## 2021-04-28 DIAGNOSIS — E782 Mixed hyperlipidemia: Secondary | ICD-10-CM

## 2021-04-29 ENCOUNTER — Telehealth: Payer: Self-pay

## 2021-04-29 NOTE — Telephone Encounter (Signed)
Copied from [place] [id number]. Topic: General - Other >> Apr 29, 2021  2:58 PM Leward Quan A wrote: Reason for CRM: Patient [name] called in needing to speak to Dr B about some medication changes that have taken place. Can be reached at  Ph# [phone]

## 2021-04-30 ENCOUNTER — Ambulatory Visit (INDEPENDENT_AMBULATORY_CARE_PROVIDER_SITE_OTHER): Payer: PPO

## 2021-04-30 DIAGNOSIS — I10 Essential (primary) hypertension: Secondary | ICD-10-CM

## 2021-04-30 DIAGNOSIS — E782 Mixed hyperlipidemia: Secondary | ICD-10-CM

## 2021-04-30 NOTE — Chronic Care Management (AMB) (Signed)
Chronic Care Management   CCM RN Visit Note  04/30/2021 Name: Barbara Chambers MRN: [id number] DOB: December 09, 1934  Subjective: Barbara Chambers is a 86 y.o. year old female who is a primary care patient of [name], [name], MD. The care management team was consulted for assistance with disease management and care coordination needs.    Engaged with patient's son/caregiver Barbara Chambers by telephone for follow up visit in response to provider referral for case management andcare coordination services.   Consent to Services:  The patient was given information about Chronic Care Management services, agreed to services, and gave verbal consent prior to initiation of services.  Please see initial visit note for detailed documentation.    Assessment: Review of patient past medical history, allergies, medications, health status, including review of consultants reports, laboratory and other test data, was performed as part of comprehensive evaluation and provision of chronic care management services.   SDOH (Social Determinants of Health) assessments and interventions performed: No   CCM Care Plan  Allergies  Allergen Reactions   Hctz [Hydrochlorothiazide]     Hyponatremia   Penicillins Rash    Outpatient Encounter Medications as of 04/30/2021  Medication Sig Note   atorvastatin (LIPITOR) 40 MG tablet Take 40 mg by mouth daily.    COENZYME Q-10 PO Take 200 mg by mouth daily.     DULoxetine (CYMBALTA) 20 MG capsule TAKE 1 CAPSULE BY MOUTH EVERY DAY    furosemide (LASIX) 20 MG tablet Take 20 mg by mouth daily.    ibrutinib (IMBRUVICA) 420 MG TABS TAKE 1 TABLET BY MOUTH DAILY.    metoprolol succinate (TOPROL-XL) 100 MG 24 hr tablet TAKE 1 TABLET BY MOUTH DAILY. TAKE WITH OR IMMEDIATELY FOLLOWING A MEAL. 02/07/2021: Reports taking 1/2 tablet.   OLANZapine (ZYPREXA) 2.5 MG tablet TAKE 1 TABLET BY MOUTH EVERYDAY AT BEDTIME    rivastigmine (EXELON) 1.5 MG capsule Take 1.5 mg by mouth 2 (two) times daily.     telmisartan (MICARDIS) 40 MG tablet Take 2 tablets (80 mg total) by mouth daily.    vitamin B-12 (CYANOCOBALAMIN) 1000 MCG tablet Take 1,000 mcg by mouth daily.    No facility-administered encounter medications on file as of 04/30/2021.    Patient Active Problem List   Diagnosis Date Noted   Need for influenza vaccination 04/25/2021   Bilateral leg edema 11/09/2019   Atherosclerosis of abdominal aorta (HCC) 11/09/2019   Senile purpura ([place]) 09/28/2019   Cervical adenopathy 09/28/2019   Memory loss 09/28/2019   Actinic keratosis 09/01/2019   Rosacea 09/01/2019   Hyponatremia 03/03/2019   Adjustment disorder with mixed anxiety and depressed mood 03/01/2019   Obsessional thoughts 03/01/2019   Hallucinations 03/01/2019   Shortness of breath 07/11/2018   Vulvar irritation 07/11/2018   Dizziness 05/18/2017   Leg cramps 04/20/2017   Arthritis 03/08/2015   Anxiety 10/25/2014   CLL (chronic lymphocytic leukemia) ([place]) 10/25/2014   HLD (hyperlipidemia) 10/25/2014   Subclinical hypothyroidism 10/25/2014   Abnormal ECG 10/11/2014   Benign essential HTN 10/11/2014      Conditions to be addressed/monitored:Medication Review with  Caregiver/HTN  Message received from clinic staff regarding request to contact patient's son to review medications. Contacted patient's son and primary caregiver [name].  Reports Barbara Chambers completed a recent Cardiology appointment and recalled being instructed to increase a medication and discontinue Zyprexa. Medications thoroughly reviewed. Reviewed notes and medication changes as outlined by the Cardiology team. Confirmed instructions to increase telmisartan. No documentation indicating need to discontinue Zyprexa.  [name] agreed to review Barbara Chambers's home medications/pill bottles and notify the clinic if additional assistance is required.   PLAN A member of the care management team will follow up with Mrs. Pryer within the next month.   [facility] Care Management [facility] [phone]

## 2021-05-06 ENCOUNTER — Other Ambulatory Visit (HOSPITAL_COMMUNITY): Payer: Self-pay

## 2021-05-08 ENCOUNTER — Other Ambulatory Visit (HOSPITAL_COMMUNITY): Payer: Self-pay

## 2021-05-08 DIAGNOSIS — E782 Mixed hyperlipidemia: Secondary | ICD-10-CM | POA: Diagnosis not present

## 2021-05-08 DIAGNOSIS — I700 Atherosclerosis of aorta: Secondary | ICD-10-CM | POA: Diagnosis not present

## 2021-05-08 DIAGNOSIS — I10 Essential (primary) hypertension: Secondary | ICD-10-CM | POA: Diagnosis not present

## 2021-05-16 ENCOUNTER — Ambulatory Visit: Payer: PPO

## 2021-05-16 DIAGNOSIS — E782 Mixed hyperlipidemia: Secondary | ICD-10-CM

## 2021-05-16 DIAGNOSIS — I10 Essential (primary) hypertension: Secondary | ICD-10-CM

## 2021-05-16 NOTE — Chronic Care Management (AMB) (Signed)
Chronic Care Management   CCM RN Visit Note  05/16/2021 Name: Barbara Chambers MRN: [id number] DOB: 1934-11-13  Subjective: Barbara Chambers is a 86 y.o. year old female who is a primary care patient of [name], [name], MD. The care management team was consulted for assistance with disease management and care coordination needs.    Engaged with patient's son [name] by telephone for follow up visit in response to provider referral for case management and care coordination services.   Consent to Services:  The patient was given information about Chronic Care Management services, agreed to services, and gave verbal consent prior to initiation of services.  Please see initial visit note for detailed documentation.    Assessment: Review of patient past medical history, allergies, medications, health status, including review of consultants reports, laboratory and other test data, was performed as part of comprehensive evaluation and provision of chronic care management services.   SDOH (Social Determinants of Health) assessments and interventions performed: No  CCM Care Plan  Allergies  Allergen Reactions   Hctz [Hydrochlorothiazide]     Hyponatremia   Penicillins Rash    Outpatient Encounter Medications as of 05/16/2021  Medication Sig Note   metoprolol succinate (TOPROL-XL) 100 MG 24 hr tablet TAKE 1 TABLET BY MOUTH DAILY. TAKE WITH OR IMMEDIATELY FOLLOWING A MEAL. 02/07/2021: Reports taking 1/2 tablet.   atorvastatin (LIPITOR) 40 MG tablet Take 40 mg by mouth daily.    COENZYME Q-10 PO Take 200 mg by mouth daily.     DULoxetine (CYMBALTA) 20 MG capsule TAKE 1 CAPSULE BY MOUTH EVERY DAY    furosemide (LASIX) 20 MG tablet Take 20 mg by mouth daily.    ibrutinib (IMBRUVICA) 420 MG TABS TAKE 1 TABLET BY MOUTH DAILY.    OLANZapine (ZYPREXA) 2.5 MG tablet TAKE 1 TABLET BY MOUTH EVERYDAY AT BEDTIME    rivastigmine (EXELON) 1.5 MG capsule Take 1.5 mg by mouth 2 (two) times daily.     telmisartan (MICARDIS) 40 MG tablet Take 2 tablets (80 mg total) by mouth daily.    vitamin B-12 (CYANOCOBALAMIN) 1000 MCG tablet Take 1,000 mcg by mouth daily.    No facility-administered encounter medications on file as of 05/16/2021.    Patient Active Problem List   Diagnosis Date Noted   Need for influenza vaccination 04/25/2021   Bilateral leg edema 11/09/2019   Atherosclerosis of abdominal aorta ([place]) 11/09/2019   Senile purpura ([place]) 09/28/2019   Cervical adenopathy 09/28/2019   Memory loss 09/28/2019   Actinic keratosis 09/01/2019   Rosacea 09/01/2019   Hyponatremia 03/03/2019   Adjustment disorder with mixed anxiety and depressed mood 03/01/2019   Obsessional thoughts 03/01/2019   Hallucinations 03/01/2019   Shortness of breath 07/11/2018   Vulvar irritation 07/11/2018   Dizziness 05/18/2017   Leg cramps 04/20/2017   Arthritis 03/08/2015   Anxiety 10/25/2014   CLL (chronic lymphocytic leukemia) ([place]) 10/25/2014   HLD (hyperlipidemia) 10/25/2014   Subclinical hypothyroidism 10/25/2014   Abnormal ECG 10/11/2014   Benign essential HTN 10/11/2014    Conditions to be addressed/monitored:HTN and HLD  Patient Care Plan: RN Care Management Plan of Care     Problem Identified: HTN, HLD      Long-Range Goal: Disease Progression Prevented or Minimized   Start Date: 05/16/2021  Expected End Date: 08/14/2021  Priority: High  Note:   Current Barriers:  Chronic Disease Management support and education needs related to HTN and HLD  RNCM Clinical Goal(s):  Patient will demonstrate ongoing adherence  to prescribed treatment plan for HTN and HLD through collaboration with providers and the care management team.   Interventions: 1:1 collaboration with primary care provider regarding development and update of comprehensive plan of care as evidenced by provider attestation and co-signature Inter-disciplinary care team collaboration (see longitudinal plan of care) Evaluation of  current treatment plan related to  self management and patient's adherence to plan as established by provider  Hypertension Interventions: Last practice recorded BP readings:  BP Readings from Last 3 Encounters:  04/25/21 (!) 144/59  02/27/21 116/62  11/28/20 (!) 144/70  Most recent eGFR/CrCl: No results found for: EGFR  No components found for: CRCL   Hyperlipidemia Interventions: Lab Results  Component Value Date   CHOL 179 09/28/2019   HDL 50 09/28/2019   LDLCALC 108 (H) 09/28/2019   TRIG 119 09/28/2019   CHOLHDL 3.6 09/28/2019      Patient Goals/Self-Care Activities: Patient will self administer medications as prescribed Patient will attend all scheduled provider appointments Patient will call pharmacy for medication refills Patient will continue to perform ADL's independently Patient will continue to perform IADL's independently Patient will call provider office for new concerns or questions   Follow Up Plan:   Will follow up next month      PLAN A member of the care management team will follow up with Mrs. Dadisman within the next month.   [facility] Care Management [facility] [phone]

## 2021-05-16 NOTE — Patient Instructions (Signed)
Thank you for allowing the Chronic Care Management team to participate in your care.  

## 2021-05-27 ENCOUNTER — Other Ambulatory Visit: Payer: Self-pay | Admitting: Family Medicine

## 2021-05-27 ENCOUNTER — Other Ambulatory Visit: Payer: Self-pay | Admitting: *Deleted

## 2021-05-27 DIAGNOSIS — C9110 Chronic lymphocytic leukemia of B-cell type not having achieved remission: Secondary | ICD-10-CM

## 2021-05-27 DIAGNOSIS — I10 Essential (primary) hypertension: Secondary | ICD-10-CM

## 2021-05-27 DIAGNOSIS — C8590 Non-Hodgkin lymphoma, unspecified, unspecified site: Secondary | ICD-10-CM

## 2021-05-28 DIAGNOSIS — E782 Mixed hyperlipidemia: Secondary | ICD-10-CM

## 2021-05-28 DIAGNOSIS — I10 Essential (primary) hypertension: Secondary | ICD-10-CM

## 2021-05-29 ENCOUNTER — Ambulatory Visit (INDEPENDENT_AMBULATORY_CARE_PROVIDER_SITE_OTHER): Payer: PPO | Admitting: Family Medicine

## 2021-05-29 ENCOUNTER — Encounter: Payer: Self-pay | Admitting: Family Medicine

## 2021-05-29 ENCOUNTER — Ambulatory Visit
Admission: RE | Admit: 2021-05-29 | Discharge: 2021-05-29 | Disposition: A | Discharge status: Home / Self Care | Payer: PPO | Source: Ambulatory Visit | Attending: Oncology | Admitting: Oncology

## 2021-05-29 ENCOUNTER — Other Ambulatory Visit: Payer: Self-pay

## 2021-05-29 ENCOUNTER — Inpatient Hospital Stay: Payer: PPO | Attending: Oncology

## 2021-05-29 VITALS — BP 140/76 | HR 73 | Temp 97.7°F | Resp 16 | Wt 135.4 lb

## 2021-05-29 DIAGNOSIS — F419 Anxiety disorder, unspecified: Secondary | ICD-10-CM | POA: Diagnosis not present

## 2021-05-29 DIAGNOSIS — C8590 Non-Hodgkin lymphoma, unspecified, unspecified site: Secondary | ICD-10-CM

## 2021-05-29 DIAGNOSIS — R443 Hallucinations, unspecified: Secondary | ICD-10-CM

## 2021-05-29 DIAGNOSIS — I10 Essential (primary) hypertension: Secondary | ICD-10-CM

## 2021-05-29 DIAGNOSIS — I700 Atherosclerosis of aorta: Secondary | ICD-10-CM | POA: Diagnosis not present

## 2021-05-29 DIAGNOSIS — E782 Mixed hyperlipidemia: Secondary | ICD-10-CM | POA: Diagnosis not present

## 2021-05-29 DIAGNOSIS — N281 Cyst of kidney, acquired: Secondary | ICD-10-CM | POA: Diagnosis not present

## 2021-05-29 DIAGNOSIS — K6389 Other specified diseases of intestine: Secondary | ICD-10-CM | POA: Diagnosis not present

## 2021-05-29 DIAGNOSIS — M47816 Spondylosis without myelopathy or radiculopathy, lumbar region: Secondary | ICD-10-CM | POA: Diagnosis not present

## 2021-05-29 DIAGNOSIS — E038 Other specified hypothyroidism: Secondary | ICD-10-CM

## 2021-05-29 DIAGNOSIS — C9110 Chronic lymphocytic leukemia of B-cell type not having achieved remission: Secondary | ICD-10-CM | POA: Diagnosis not present

## 2021-05-29 DIAGNOSIS — I2510 Atherosclerotic heart disease of native coronary artery without angina pectoris: Secondary | ICD-10-CM | POA: Diagnosis not present

## 2021-05-29 LAB — CBC WITH DIFFERENTIAL/PLATELET
Abs Immature Granulocytes: 0.06 10*3/uL (ref 0.00–0.07)
Basophils Absolute: 0 10*3/uL (ref 0.0–0.1)
Basophils Relative: 0 %
Eosinophils Absolute: 0.1 10*3/uL (ref 0.0–0.5)
Eosinophils Relative: 1 %
HCT: 39.9 % (ref 36.0–46.0)
Hemoglobin: 12.4 g/dL (ref 12.0–15.0)
Immature Granulocytes: 1 %
Lymphocytes Relative: 40 %
Lymphs Abs: 5 10*3/uL — ABNORMAL HIGH (ref 0.7–4.0)
MCH: 24.6 pg — ABNORMAL LOW (ref 26.0–34.0)
MCHC: 31.1 g/dL (ref 30.0–36.0)
MCV: 79 fL — ABNORMAL LOW (ref 80.0–100.0)
Monocytes Absolute: 0.9 10*3/uL (ref 0.1–1.0)
Monocytes Relative: 7 %
Neutro Abs: 6.4 10*3/uL (ref 1.7–7.7)
Neutrophils Relative %: 51 %
Platelets: 239 10*3/uL (ref 150–400)
RBC: 5.05 MIL/uL (ref 3.87–5.11)
RDW: 15.9 % — ABNORMAL HIGH (ref 11.5–15.5)
WBC: 12.5 10*3/uL — ABNORMAL HIGH (ref 4.0–10.5)
nRBC: 0 % (ref 0.0–0.2)

## 2021-05-29 LAB — COMPREHENSIVE METABOLIC PANEL
ALT: 15 U/L (ref 0–44)
AST: 26 U/L (ref 15–41)
Albumin: 3.8 g/dL (ref 3.5–5.0)
Alkaline Phosphatase: 94 U/L (ref 38–126)
Anion gap: 10 (ref 5–15)
BUN: 14 mg/dL (ref 8–23)
CO2: 29 mmol/L (ref 22–32)
Calcium: 8.7 mg/dL — ABNORMAL LOW (ref 8.9–10.3)
Chloride: 100 mmol/L (ref 98–111)
Creatinine, Ser: 0.84 mg/dL (ref 0.44–1.00)
GFR, Estimated: >60 mL/min (ref >60)
Glucose, Bld: 93 mg/dL (ref 70–99)
Potassium: 4 mmol/L (ref 3.5–5.1)
Sodium: 139 mmol/L (ref 135–145)
Total Bilirubin: 0.6 mg/dL (ref 0.3–1.2)
Total Protein: 6.6 g/dL (ref 6.5–8.1)

## 2021-05-29 MED ORDER — IOHEXOL 300 MG/ML  SOLN
100.0000 mL | Freq: Once | INTRAMUSCULAR | Status: AC | PRN
  Administered 2021-05-29: 100 mL via INTRAVENOUS

## 2021-05-29 NOTE — Assessment & Plan Note (Signed)
Asymptomatic No meds Recheck TSH and free T4

## 2021-05-29 NOTE — Assessment & Plan Note (Signed)
Previously well controlled Continue statin Repeat FLP Reviewed CMP

## 2021-05-29 NOTE — Assessment & Plan Note (Signed)
F/b neuro Fairly well managed Continue zyprexa

## 2021-05-29 NOTE — Assessment & Plan Note (Signed)
Chronic and stable  Continue cymbalta

## 2021-05-29 NOTE — Assessment & Plan Note (Signed)
Well controlled Continue current medications Reviewed recent metabolic panel F/u in 6 months  

## 2021-05-29 NOTE — Progress Notes (Signed)
Established patient visit   Patient: Barbara Chambers   DOB: Jun 24, 1935   86 y.o. Female  MRN: [id number] Visit Date: 05/29/2021  Today's healthcare provider: Lavon Paganini, MD   Chief Complaint  Patient presents with   Follow-up   Subjective    HPI  Patient here to follow up on Chronic disease. She was followed a month ago for Anxiety,Hypertension and memory.  Patient reports she had a CTscan this morning. Upcoming appt with Dr [name] Patient C/O skin changes on her right arm.   She is taking meds with good compliance   Medications: Outpatient Medications Prior to Visit  Medication Sig   atorvastatin (LIPITOR) 40 MG tablet Take 40 mg by mouth daily.   COENZYME Q-10 PO Take 200 mg by mouth daily.    DULoxetine (CYMBALTA) 20 MG capsule TAKE 1 CAPSULE BY MOUTH EVERY DAY   furosemide (LASIX) 20 MG tablet Take 20 mg by mouth daily.   ibrutinib (IMBRUVICA) 420 MG TABS TAKE 1 TABLET BY MOUTH DAILY.   metoprolol succinate (TOPROL-XL) 100 MG 24 hr tablet TAKE 1 TABLET BY MOUTH DAILY. TAKE WITH OR IMMEDIATELY FOLLOWING A MEAL.   OLANZapine (ZYPREXA) 2.5 MG tablet TAKE 1 TABLET BY MOUTH EVERYDAY AT BEDTIME   rivastigmine (EXELON) 1.5 MG capsule Take 1.5 mg by mouth 2 (two) times daily.   telmisartan (MICARDIS) 40 MG tablet Take 2 tablets (80 mg total) by mouth daily.   vitamin B-12 (CYANOCOBALAMIN) 1000 MCG tablet Take 1,000 mcg by mouth daily.   No facility-administered medications prior to visit.    Review of Systems  Constitutional:  Negative for appetite change and fatigue.  Respiratory:  Negative for chest tightness and shortness of breath.   Cardiovascular:  Positive for leg swelling. Negative for chest pain and palpitations.  Skin:  Positive for color change.       Objective    BP 140/76 (BP Location: Right Arm, Patient Position: Sitting, Cuff Size: Large)   Pulse 73   Temp 97.7 F (36.5 C) (Temporal)   Resp 16   Wt 135 lb 6.4 oz (61.4 kg)   SpO2 99%    BMI 27.35 kg/m  BP Readings from Last 3 Encounters:  05/29/21 140/76  04/25/21 (!) 144/59  02/27/21 116/62   Wt Readings from Last 3 Encounters:  05/29/21 135 lb 6.4 oz (61.4 kg)  04/25/21 133 lb 3.2 oz (60.4 kg)  02/27/21 132 lb 8 oz (60.1 kg)      Physical Exam Vitals reviewed.  Constitutional:      General: She is not in acute distress.    Appearance: Normal appearance. She is well-developed. She is not diaphoretic.  HENT:     Head: Normocephalic and atraumatic.  Eyes:     General: No scleral icterus.    Conjunctiva/sclera: Conjunctivae normal.  Neck:     Thyroid: No thyromegaly.  Cardiovascular:     Rate and Rhythm: Normal rate and regular rhythm.     Pulses: Normal pulses.     Heart sounds: Murmur heard.  Pulmonary:     Effort: Pulmonary effort is normal. No respiratory distress.     Breath sounds: Normal breath sounds. No wheezing, rhonchi or rales.  Musculoskeletal:     Cervical back: Neck supple.     Right lower leg: No edema.     Left lower leg: No edema.  Lymphadenopathy:     Cervical: No cervical adenopathy.  Skin:    General: Skin is warm and  dry.     Findings: No rash.  Neurological:     Mental Status: She is alert and oriented to person, place, and time. Mental status is at baseline.  Psychiatric:        Mood and Affect: Mood normal.        Behavior: Behavior normal.      Results for orders placed or performed in visit on 05/29/21  Comprehensive metabolic panel  Result Value Ref Range   Sodium 139 135 - 145 mmol/L   Potassium 4.0 3.5 - 5.1 mmol/L   Chloride 100 98 - 111 mmol/L   CO2 29 22 - 32 mmol/L   Glucose, Bld 93 70 - 99 mg/dL   BUN 14 8 - 23 mg/dL   Creatinine, Ser 0.84 0.44 - 1.00 mg/dL   Calcium 8.7 (L) 8.9 - 10.3 mg/dL   Total Protein 6.6 6.5 - 8.1 g/dL   Albumin 3.8 3.5 - 5.0 g/dL   AST 26 15 - 41 U/L   ALT 15 0 - 44 U/L   Alkaline Phosphatase 94 38 - 126 U/L   Total Bilirubin 0.6 0.3 - 1.2 mg/dL   GFR, Estimated >60 >60  mL/min   Anion gap 10 5 - 15  CBC with Differential  Result Value Ref Range   WBC 12.5 (H) 4.0 - 10.5 K/uL   RBC 5.05 3.87 - 5.11 MIL/uL   Hemoglobin 12.4 12.0 - 15.0 g/dL   HCT 39.9 36.0 - 46.0 %   MCV 79.0 (L) 80.0 - 100.0 fL   MCH 24.6 (L) 26.0 - 34.0 pg   MCHC 31.1 30.0 - 36.0 g/dL   RDW 15.9 (H) 11.5 - 15.5 %   Platelets 239 150 - 400 K/uL   nRBC 0.0 0.0 - 0.2 %   Neutrophils Relative % 51 %   Neutro Abs 6.4 1.7 - 7.7 K/uL   Lymphocytes Relative 40 %   Lymphs Abs 5.0 (H) 0.7 - 4.0 K/uL   Monocytes Relative 7 %   Monocytes Absolute 0.9 0.1 - 1.0 K/uL   Eosinophils Relative 1 %   Eosinophils Absolute 0.1 0.0 - 0.5 K/uL   Basophils Relative 0 %   Basophils Absolute 0.0 0.0 - 0.1 K/uL   Immature Granulocytes 1 %   Abs Immature Granulocytes 0.06 0.00 - 0.07 K/uL    Assessment & Plan     Problem List Items Addressed This Visit       Cardiovascular and Mediastinum   Benign essential HTN    Well controlled Continue current medications Reviewed recent metabolic panel F/u in 6 months         Endocrine   Subclinical hypothyroidism - Primary    Asymptomatic No meds Recheck TSH and free T4      Relevant Orders   TSH + free T4     Other   Anxiety    Chronic and stable Continue cymbalta      HLD (hyperlipidemia)    Previously well controlled Continue statin Repeat FLP Reviewed CMP      Relevant Orders   Lipid panel   Hallucinations    F/b neuro Fairly well managed Continue zyprexa        Return in about 6 months (around 11/27/2021) for CPE.      I, Lavon Paganini, MD, have reviewed all documentation for this visit. The documentation on 05/29/21 for the exam, diagnosis, procedures, and orders are all accurate and complete.   Mycal Conde, [name], MD, MPH [facility]  [facility]

## 2021-05-30 NOTE — Progress Notes (Signed)
Barbara  [phone]  [phone]     Barbara Chambers DOB: [birth year]  MR#: [id number]  LKT#:[id number]  Patient Care Team: [name], MD as PCP - General (Family Medicine) Lloyd Huger, MD as Consulting Physician (Oncology) Corey Skains, MD as Consulting Physician (Cardiology) Birder Robson, MD as Referring Physician (Ophthalmology) Neldon Labella, RN as Case Manager   CHIEF COMPLAINT: CLL.  INTERVAL HISTORY: Patient returns to clinic today for repeat laboratory work, further evaluation, discussion of her imaging results.  She continues to feel well and remains asymptomatic.  She is tolerating Imbruvica well without significant side effects.  She denies any weakness or fatigue.  She has no neurologic complaints. She denies any fevers, night sweats, or weight loss. She denies any chest pain, shortness of breath, cough, or hemoptysis.  She denies any nausea, vomiting, constipation, or diarrhea. She has no urinary complaints.  Patient offers no specific complaints today.  REVIEW OF SYSTEMS:   Review of Systems  Constitutional: Negative.  Negative for diaphoresis, fever, malaise/fatigue and weight loss.  Respiratory: Negative.  Negative for cough and shortness of breath.   Cardiovascular: Negative.  Negative for chest pain and leg swelling.  Gastrointestinal: Negative.  Negative for abdominal pain.  Genitourinary: Negative.  Negative for dysuria.  Musculoskeletal: Negative.  Negative for back pain.  Skin: Negative.  Negative for rash.  Neurological: Negative.  Negative for dizziness, sensory change, focal weakness, weakness and headaches.  Psychiatric/Behavioral: Negative.  Negative for depression and memory loss. The patient is not nervous/anxious.    As per HPI. Otherwise, a complete review of systems is negative.  PAST MEDICAL HISTORY: Past Medical History:  Diagnosis Date   Chronic lymphocytic leukemia ([place]) 2011    Hyperlipidemia    Hypertension     PAST SURGICAL HISTORY: Past Surgical History:  Procedure Laterality Date   cataract surgery Bilateral 2005   CHOLECYSTECTOMY  1991   RECONSTRUCTION OF EYELID      FAMILY HISTORY Family History  Problem Relation Age of Onset   Hypertension Mother    Stroke Mother    CAD Mother    Hypertension Father    Heart attack Father    Hypertension Brother    Seizures Brother    Hypertension Son    Breast cancer Neg Hx     GYNECOLOGIC HISTORY:  No LMP recorded. Patient is postmenopausal.     ADVANCED DIRECTIVES:    HEALTH MAINTENANCE: Social History   Tobacco Use   Smoking status: Never   Smokeless tobacco: Never  Vaping Use   Vaping Use: Never used  Substance Use Topics   Alcohol use: No   Drug use: No     Colonoscopy:  PAP:  Bone density:  Lipid panel:  Allergies  Allergen Reactions   Hctz [Hydrochlorothiazide]     Hyponatremia   Penicillins Rash    Current Outpatient Medications  Medication Sig Dispense Refill   atorvastatin (LIPITOR) 40 MG tablet Take 40 mg by mouth daily.     COENZYME Q-10 PO Take 200 mg by mouth daily.      DULoxetine (CYMBALTA) 20 MG capsule TAKE 1 CAPSULE BY MOUTH EVERY DAY 90 capsule 0   furosemide (LASIX) 20 MG tablet Take 20 mg by mouth daily.     ibrutinib (IMBRUVICA) 420 MG TABS TAKE 1 TABLET BY MOUTH DAILY. 28 tablet 5   metoprolol succinate (TOPROL-XL) 100 MG 24 hr tablet TAKE 1 TABLET BY MOUTH DAILY. TAKE WITH OR IMMEDIATELY  FOLLOWING A MEAL. 90 tablet 0   OLANZapine (ZYPREXA) 2.5 MG tablet TAKE 1 TABLET BY MOUTH EVERYDAY AT BEDTIME 90 tablet 1   rivastigmine (EXELON) 1.5 MG capsule Take 1.5 mg by mouth 2 (two) times daily.     telmisartan (MICARDIS) 40 MG tablet Take 2 tablets (80 mg total) by mouth daily. 180 tablet 3   vitamin B-12 (CYANOCOBALAMIN) 1000 MCG tablet Take 1,000 mcg by mouth daily.     No current facility-administered medications for this visit.    OBJECTIVE: BP (!) 150/56  (BP Location: Left Arm)   Pulse (!) 59   Resp 18   Wt 131 lb (59.4 kg)   SpO2 97%   BMI 26.46 kg/m    Body mass index is 26.46 kg/m.    ECOG FS:0 - Asymptomatic   General: Well-developed, well-nourished, no acute distress. Eyes: Pink conjunctiva, anicteric sclera. HEENT: Normocephalic, moist mucous membranes. Lungs: No audible wheezing or coughing. Heart: Regular rate and rhythm. Abdomen: Soft, nontender, no obvious distention. Musculoskeletal: No edema, cyanosis, or clubbing. Neuro: Alert, answering all questions appropriately. Cranial nerves grossly intact. Skin: No rashes or petechiae noted. Psych: Normal affect.  LAB RESULTS:  No visits with results within 3 Day(s) from this visit.  Latest known visit with results is:  Appointment on 05/29/2021  Component Date Value Ref Range Status   Sodium 05/29/2021 139  135 - 145 mmol/L Final   Potassium 05/29/2021 4.0  3.5 - 5.1 mmol/L Final   Chloride 05/29/2021 100  98 - 111 mmol/L Final   CO2 05/29/2021 29  22 - 32 mmol/L Final   Glucose, Bld 05/29/2021 93  70 - 99 mg/dL Final   Glucose reference range applies only to samples taken after fasting for at least 8 hours.   BUN 05/29/2021 14  8 - 23 mg/dL Final   Creatinine, Ser 05/29/2021 0.84  0.44 - 1.00 mg/dL Final   Calcium 05/29/2021 8.7 (L)  8.9 - 10.3 mg/dL Final   Total Protein 05/29/2021 6.6  6.5 - 8.1 g/dL Final   Albumin 05/29/2021 3.8  3.5 - 5.0 g/dL Final   AST 05/29/2021 26  15 - 41 U/L Final   ALT 05/29/2021 15  0 - 44 U/L Final   Alkaline Phosphatase 05/29/2021 94  38 - 126 U/L Final   Total Bilirubin 05/29/2021 0.6  0.3 - 1.2 mg/dL Final   GFR, Estimated 05/29/2021 >60  >60 mL/min Final   Comment: (NOTE) Calculated using the CKD-EPI Creatinine Equation (2021)    Anion gap 05/29/2021 10  5 - 15 Final   Performed at [facility], [place], [place] [address]   WBC 05/29/2021 12.5 (H)  4.0 - 10.5 K/uL Final   RBC 05/29/2021 5.05  3.87 - 5.11  MIL/uL Final   Hemoglobin 05/29/2021 12.4  12.0 - 15.0 g/dL Final   HCT 05/29/2021 39.9  36.0 - 46.0 % Final   MCV 05/29/2021 79.0 (L)  80.0 - 100.0 fL Final   MCH 05/29/2021 24.6 (L)  26.0 - 34.0 pg Final   MCHC 05/29/2021 31.1  30.0 - 36.0 g/dL Final   RDW 05/29/2021 15.9 (H)  11.5 - 15.5 % Final   Platelets 05/29/2021 239  150 - 400 K/uL Final   nRBC 05/29/2021 0.0  0.0 - 0.2 % Final   Neutrophils Relative % 05/29/2021 51  % Final   Neutro Abs 05/29/2021 6.4  1.7 - 7.7 K/uL Final   Lymphocytes Relative 05/29/2021 40  %  Final   Lymphs Abs 05/29/2021 5.0 (H)  0.7 - 4.0 K/uL Final   Monocytes Relative 05/29/2021 7  % Final   Monocytes Absolute 05/29/2021 0.9  0.1 - 1.0 K/uL Final   Eosinophils Relative 05/29/2021 1  % Final   Eosinophils Absolute 05/29/2021 0.1  0.0 - 0.5 K/uL Final   Basophils Relative 05/29/2021 0  % Final   Basophils Absolute 05/29/2021 0.0  0.0 - 0.1 K/uL Final   Immature Granulocytes 05/29/2021 1  % Final   Abs Immature Granulocytes 05/29/2021 0.06  0.00 - 0.07 K/uL Final   Performed at [facility], [address] [place], [place] [address]    STUDIES: No results found.   ASSESSMENT: CLL.  PLAN:    1.  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  Patient initiated Imbruvica in May 2021.  Her white blood cell count continues to slowly trend down and is currently 12.5.  Her most recent imaging on May 29, 2021 reviewed independently and reported as above with essentially stable disease.  Can consider imaging once per year.  Continue Imbruvica as prescribed.  Return to clinic in 3 months for laboratory work and evaluation by clinical pharmacy.  Patient will then return to clinic in 6 months for laboratory work and further evaluation.   2.  Anemia: Resolved. 3.  Hypokalemia: Resolved.  I spent a total of 30 minutes reviewing chart data, face-to-face evaluation with the patient,  counseling and coordination of care as detailed above.    Patient expressed understanding and was in agreement with this plan. She also understands that She can call clinic at any time with any questions, concerns, or complaints.    Lloyd Huger, MD   06/04/2021 12:06 PM

## 2021-06-03 ENCOUNTER — Other Ambulatory Visit: Payer: Self-pay | Admitting: Oncology

## 2021-06-03 ENCOUNTER — Inpatient Hospital Stay: Payer: PPO | Admitting: Oncology

## 2021-06-03 ENCOUNTER — Other Ambulatory Visit (HOSPITAL_COMMUNITY): Payer: Self-pay

## 2021-06-03 ENCOUNTER — Other Ambulatory Visit: Payer: Self-pay

## 2021-06-03 VITALS — BP 150/56 | HR 59 | Resp 18 | Wt 131.0 lb

## 2021-06-03 DIAGNOSIS — C9110 Chronic lymphocytic leukemia of B-cell type not having achieved remission: Secondary | ICD-10-CM | POA: Diagnosis not present

## 2021-06-03 MED ORDER — IMBRUVICA 420 MG PO TABS
1.0000 | ORAL_TABLET | Freq: Every day | ORAL | 5 refills | Status: DC
  Filled 2021-06-03: qty 28, 28d supply, fill #0
  Filled 2021-07-01: qty 28, 28d supply, fill #1
  Filled 2021-07-29: qty 28, 28d supply, fill #2
  Filled 2021-08-28: qty 28, 28d supply, fill #3
  Filled 2021-09-23: qty 28, 28d supply, fill #4
  Filled 2021-10-17: qty 28, 28d supply, fill #5

## 2021-06-05 ENCOUNTER — Other Ambulatory Visit (HOSPITAL_COMMUNITY): Payer: Self-pay

## 2021-06-10 DIAGNOSIS — E038 Other specified hypothyroidism: Secondary | ICD-10-CM | POA: Diagnosis not present

## 2021-06-10 DIAGNOSIS — E782 Mixed hyperlipidemia: Secondary | ICD-10-CM | POA: Diagnosis not present

## 2021-06-11 LAB — LIPID PANEL
Chol/HDL Ratio: 2.1 ratio (ref 0.0–4.4)
Cholesterol, Total: 134 mg/dL (ref 100–199)
HDL: 63 mg/dL (ref >39)
LDL Chol Calc (NIH): 58 mg/dL (ref 0–99)
Triglycerides: 60 mg/dL (ref 0–149)
VLDL Cholesterol Cal: 13 mg/dL (ref 5–40)

## 2021-06-11 LAB — TSH+FREE T4
Free T4: 1.07 ng/dL (ref 0.82–1.77)
TSH: 11.3 u[IU]/mL — ABNORMAL HIGH (ref 0.450–4.500)

## 2021-07-01 ENCOUNTER — Other Ambulatory Visit (HOSPITAL_COMMUNITY): Payer: Self-pay

## 2021-07-03 ENCOUNTER — Other Ambulatory Visit (HOSPITAL_COMMUNITY): Payer: Self-pay

## 2021-07-13 ENCOUNTER — Other Ambulatory Visit: Payer: Self-pay | Admitting: Family Medicine

## 2021-07-13 NOTE — Telephone Encounter (Signed)
med dc'd 04/15/21 Tally Joe FNP  Requested Prescriptions  Refused Prescriptions Disp Refills   amLODipine (NORVASC) 5 MG tablet [Pharmacy Med Name: AMLODIPINE BESYLATE 5 MG TAB] 90 tablet 0    Sig: TAKE 1 TABLET BY MOUTH EVERY DAY     Cardiovascular:  Calcium Channel Blockers Failed - 07/13/2021 10:54 AM      Failed - Last BP in normal range    BP Readings from Last 1 Encounters:  06/03/21 (!) 150/56         Passed - Valid encounter within last 6 months    Recent Outpatient Visits          1 month ago Subclinical hypothyroidism   [facility], [name], MD   2 months ago Benign essential HTN   [facility] Gwyneth Sprout, FNP   7 months ago Encounter for annual physical exam   [facility], [name], MD   1 year ago Benign essential HTN   [facility], [name], MD   1 year ago Benign essential HTN   [name], [name], MD      Future Appointments            In 4 months [name], [name], MD [facility], [place]

## 2021-07-19 IMAGING — CT CT CHEST W/ CM
3 of 6 series · 13 of 36 positions shown, 15 images · IV contrast (omnipaque)
Comparison: 06/18/2010.

CLINICAL DATA: History of lymphoma. Lymphadenopathy. Bilateral
lower extremity edema.

EXAM:
CT CHEST, ABDOMEN, AND PELVIS WITH CONTRAST
TECHNIQUE: Multidetector CT imaging of the chest, abdomen and pelvis was
performed following the standard protocol during bolus
administration of intravenous contrast.
CONTRAST:  85mL OMNIPAQUE IOHEXOL 300 MG/ML  SOLN

[Series 3: axials cap 5.00 · axial · 0.65mm/px · z∈[-1373,-948]mm · 6 of 120 slices shown, 8 images]
[im 18/120  mediastinal]
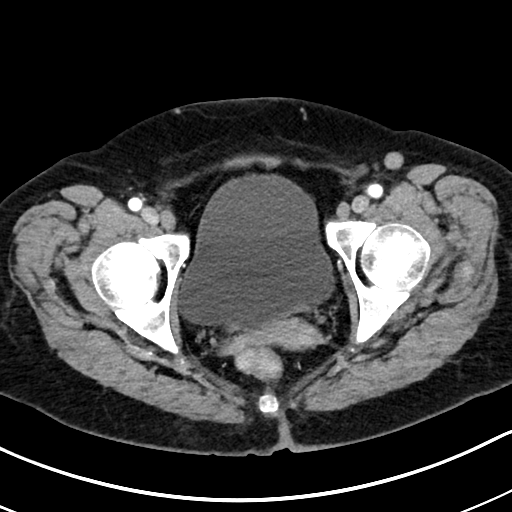
[im 18/120  lung]
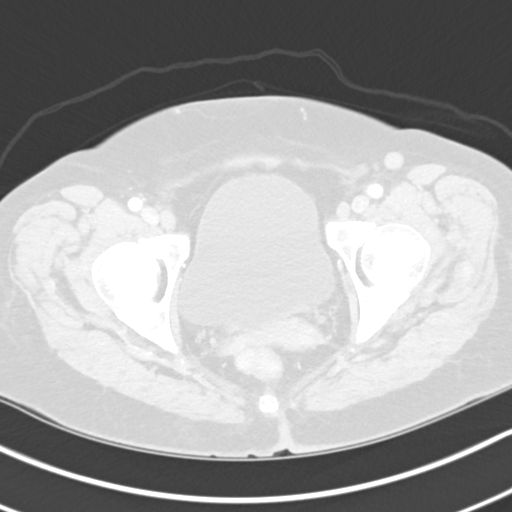
[im 35/120  lung]
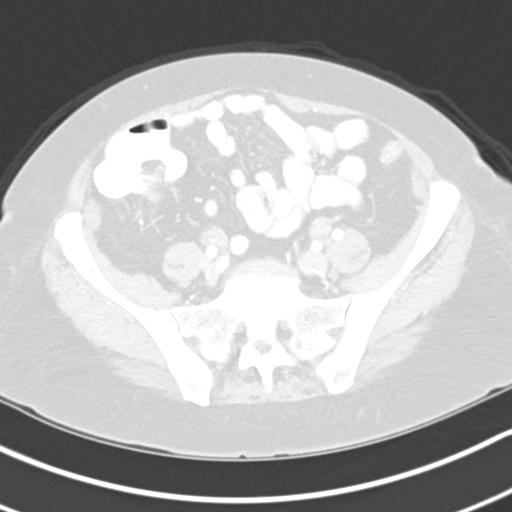
[im 52/120  lung]
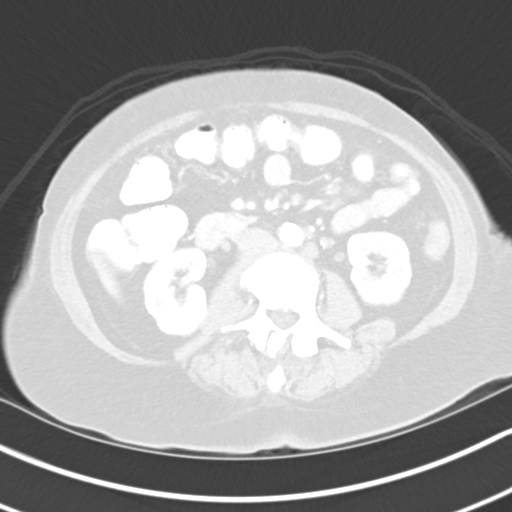
[im 69/120  lung]
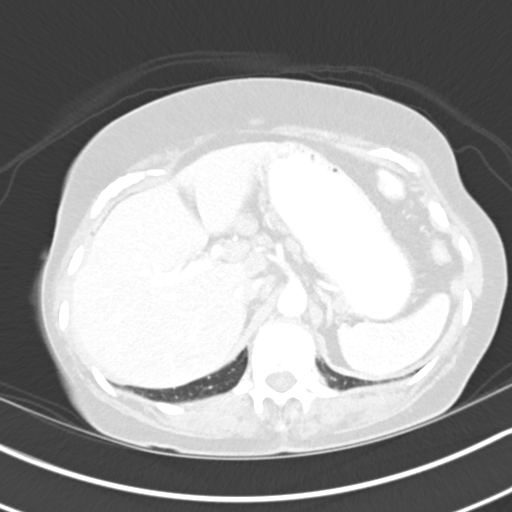
[im 86/120  mediastinal]
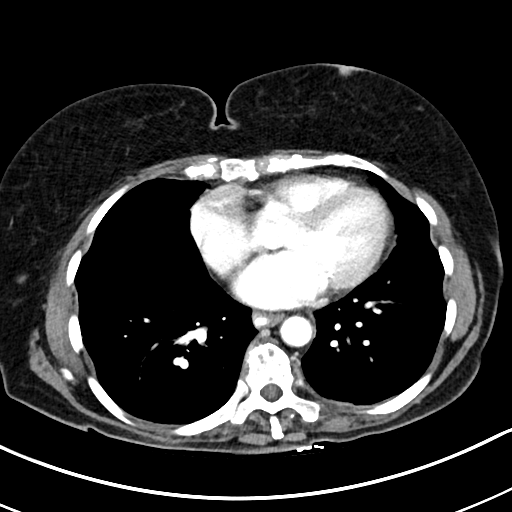
[im 86/120  lung]
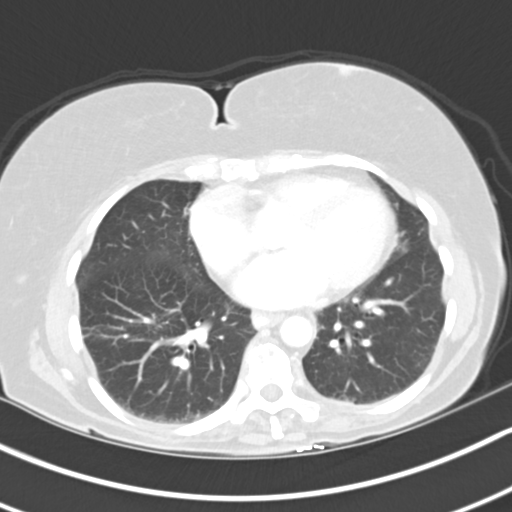
[im 103/120  lung]
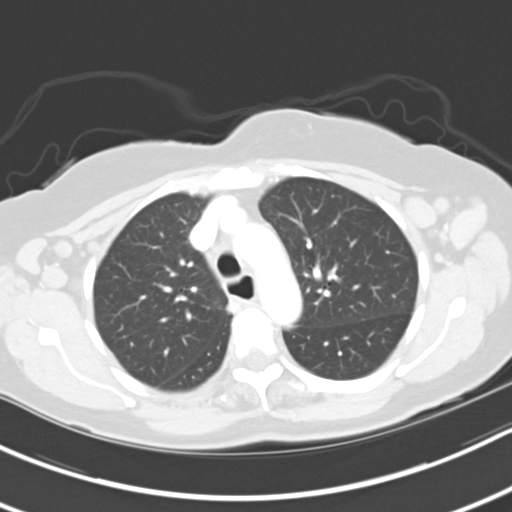

[Series 5: coronals cap 2.00 cor · coronal · 0.65mm/px · 3 of 139 slices shown]
[im 28/139  lung]
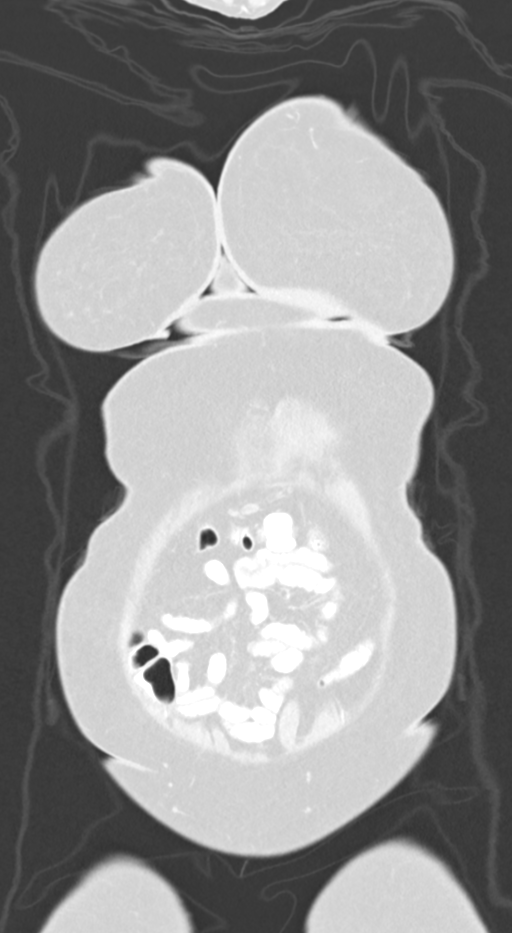
[im 56/139  lung]
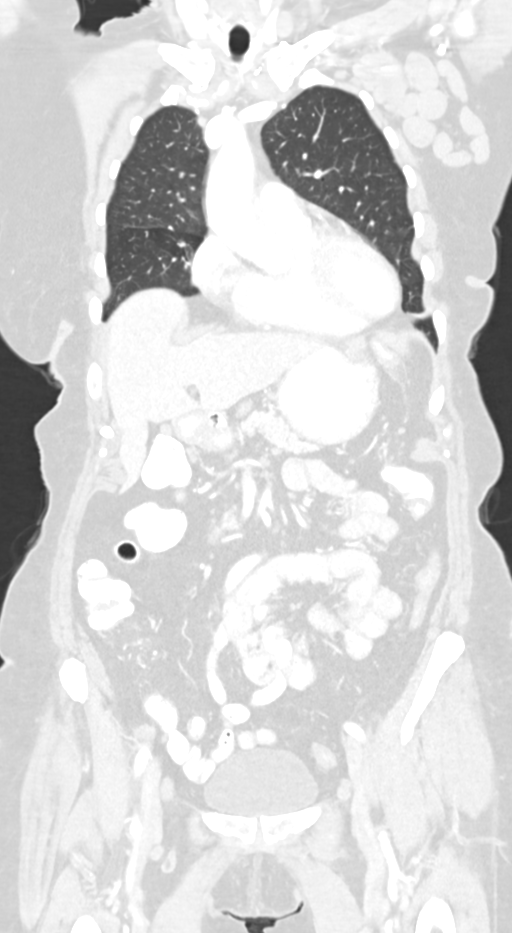
[im 83/139  lung]
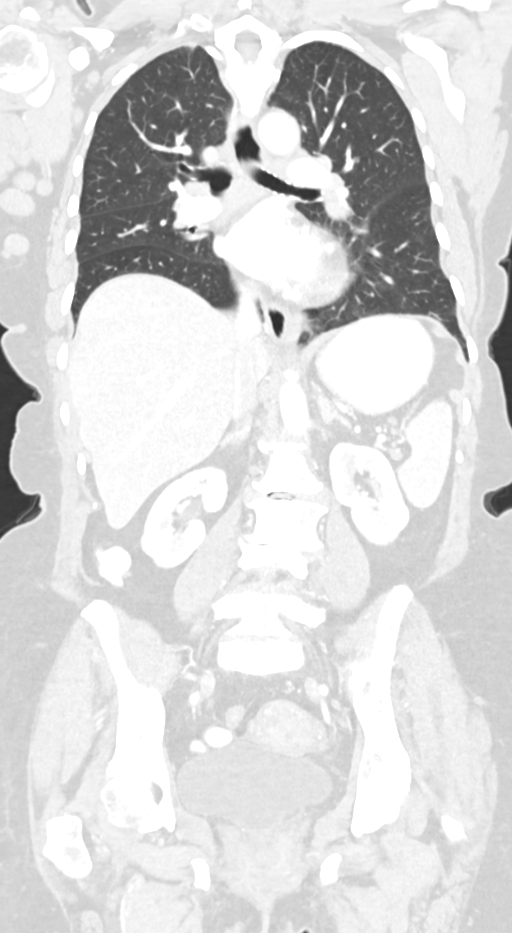

[Series 9: full fov cap 5.00 · axial · 0.80mm/px · z∈[-1373,-1118]mm · 4 of 120 slices shown]
[im 18/120  lung]
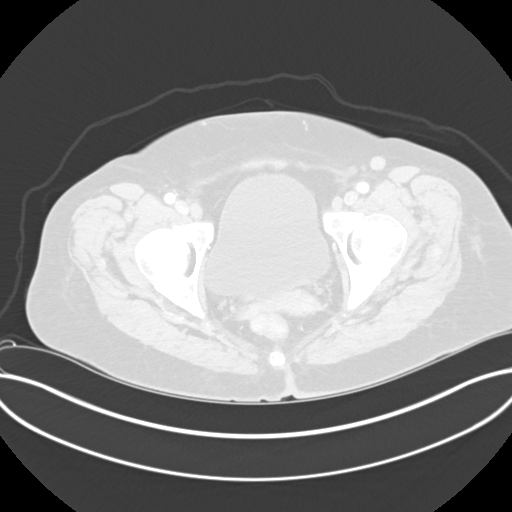
[im 35/120  lung]
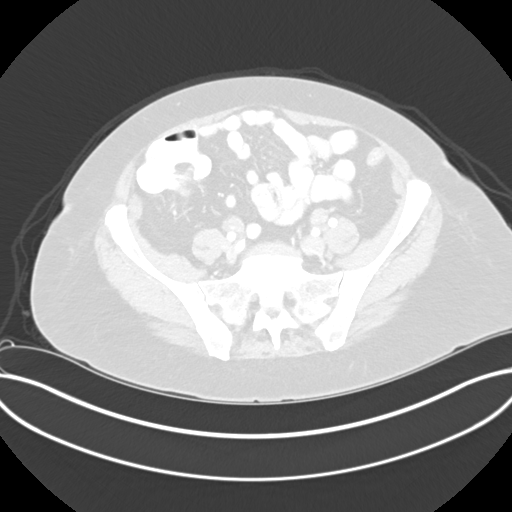
[im 52/120  lung]
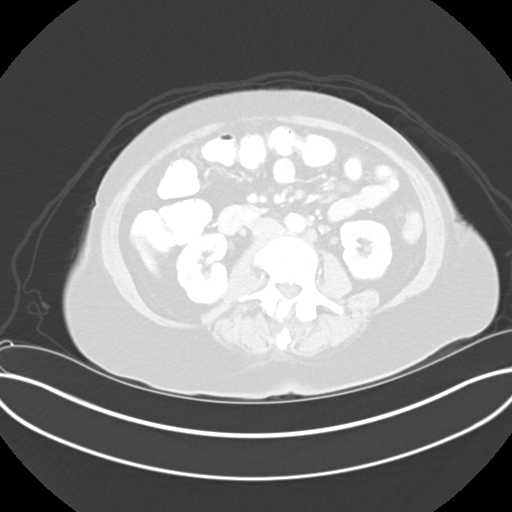
[im 69/120  lung]
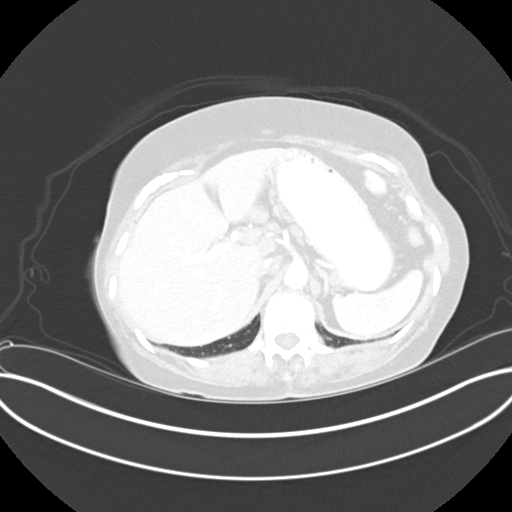

[13 of 36 positions shown; findings below may reference images not displayed]

FINDINGS: CT CHEST FINDINGS

Cardiovascular: Atherosclerotic calcification of the aorta. Heart
size is normal. No pericardial effusion.

Mediastinum/Nodes: Low internal jugular and anterior scalene lymph
nodes measure up to 9 mm on the right, new from 06/18/2010.
Mediastinal adenopathy measures up to 1.4 cm in the low left
paratracheal station, increased from 5 mm on 06/18/2010. Bulky
bilateral axillary and subpectoral adenopathy. Index left axillary
lymph node measures 2.2 cm ([DATE]), enlarged from 9 mm. Esophagus is
mildly dilated and contains fluid.

Lungs/Pleura: Mild mosaic attenuation in the lungs, nonspecific.
Multiple scattered tiny pulmonary nodules measure 4 mm or less in
size and appear largely new from 06/18/2010. 8 x 11 mm nodule in the
medial segment right middle lobe (4/78) is unchanged from 06/18/2010
and considered benign. Scattered volume loss in the right middle
lobe, lingula and both lower lobes. No pleural fluid. Airway is
unremarkable.

Musculoskeletal: No worrisome lytic or sclerotic lesions.

CT ABDOMEN PELVIS FINDINGS

Hepatobiliary: Liver is unremarkable. Cholecystectomy. No biliary
ductal dilatation.

Pancreas: Negative.

Spleen: Contains subcentimeter low-attenuation lesions which are too
small to characterize. Normal in size.

Adrenals/Urinary Tract: Slight nodularity of the lateral limb right
adrenal gland. There may be a small gastric diverticulum, mimicking
a left adrenal nodule (3/52-53). Left adrenal gland is otherwise
unremarkable. Low-attenuation lesions in the right kidney measure up
to 1.7 cm and are likely cysts. Ureters are decompressed. Bladder is
grossly unremarkable.

Stomach/Bowel: Stomach, small bowel, appendix and colon are
unremarkable.

Vascular/Lymphatic: Atherosclerotic calcification of the aorta
without aneurysm. Gastrohepatic ligament lymph nodes measure up to
10 mm, enlarged from 6 mm. Periportal lymph nodes measure up to
cm (3/58), enlarged from 1.2 cm. Abdominal retroperitoneal lymph
nodes measure up to 8 mm in the aortocaval station (3/62), increased
from 5 mm. Bilateral iliac chain lymph nodes measure up to 11 mm in
the right external iliac chain (3/97), previously 7 mm. Inguinal
adenopathy measures up to 9 mm on the right, previously 5 mm. Small
bowel mesenteric lymph nodes measure up to 8 mm in the ileocolic
mesentery (3/79), increased from 6 mm.

Reproductive: Uterus is visualized and likely contains internal an
exophytic low-attenuation lesions, measuring up to 1.8 cm, likely
fibroids.

Other: No free fluid. Mesenteries and peritoneum are otherwise
unremarkable.

Musculoskeletal: Degenerative changes in the spine. No worrisome
lytic or sclerotic lesions.
IMPRESSION: 1. Adenopathy throughout the low neck, chest, abdomen and pelvis is
most indicative of recurrent lymphoma. Spleen is normal in size.
2. Tiny scattered pulmonary nodules appear largely new from
06/18/2010 but are nonspecific. Continued attention on follow-up
exams is warranted.
3.  Aortic atherosclerosis ([id number]).

## 2021-07-29 ENCOUNTER — Other Ambulatory Visit (HOSPITAL_COMMUNITY): Payer: Self-pay

## 2021-08-01 ENCOUNTER — Ambulatory Visit: Payer: Self-pay

## 2021-08-01 NOTE — Telephone Encounter (Signed)
[name] called and says his mom is having oral surgery one day next week and she wants to know if she should stop taking the blood thinner she's on. I advised since the oncologist is the one who prescribes it to call that office for advice, he verbalized understanding.   Summary: Med question, oral surgery/blood thinner?   Pt wants to know if she needs to stop taking her blood thinner before her oral surgery next week. Please advise  Best contact: [phone]      Reason for Disposition  [1] Caller has medicine question about med NOT prescribed by PCP AND [2] triager unable to answer question (e.g., compatibility with other med, storage)  Answer Assessment - Initial Assessment Questions 1. NAME of MEDICATION: "What medicine are you calling about?"     Blood thinner  2. QUESTION: "What is your question?" (e.g., double dose of medicine, side effect)     Should stop taking it for oral surgery 3. PRESCRIBING HCP: "Who prescribed it?" Reason: if prescribed by specialist, call should be referred to that group.     Oncologist  Protocols used: Medication Question Call-A-AH

## 2021-08-04 ENCOUNTER — Other Ambulatory Visit (HOSPITAL_COMMUNITY): Payer: Self-pay

## 2021-08-12 ENCOUNTER — Telehealth: Payer: Self-pay | Admitting: *Deleted

## 2021-08-12 NOTE — Chronic Care Management (AMB) (Signed)
°  Care Management   Note  08/12/2021 Name: KALANI BARAY MRN: [id number] DOB: [birth year]  Barbara Chambers is a 87 y.o. year old female who is a primary care patient of [name], [name], MD and is actively engaged with the care management team. I reached out to Barbara Chambers by phone today to assist with scheduling an initial visit with the Pharmacist  Follow up plan: Unsuccessful telephone outreach attempt made. A HIPAA compliant phone message was left for the patient providing contact information and requesting a return call.   Julian Hy, [facility]  Direct Dial: [phone]

## 2021-08-27 ENCOUNTER — Other Ambulatory Visit (HOSPITAL_COMMUNITY): Payer: Self-pay

## 2021-08-28 ENCOUNTER — Other Ambulatory Visit (HOSPITAL_COMMUNITY): Payer: Self-pay

## 2021-09-01 ENCOUNTER — Other Ambulatory Visit (HOSPITAL_COMMUNITY): Payer: Self-pay

## 2021-09-02 ENCOUNTER — Inpatient Hospital Stay: Payer: PPO | Attending: Oncology

## 2021-09-02 ENCOUNTER — Other Ambulatory Visit: Payer: Self-pay

## 2021-09-02 ENCOUNTER — Inpatient Hospital Stay: Payer: PPO | Admitting: Pharmacist

## 2021-09-02 VITALS — BP 133/54 | HR 56 | Temp 96.7°F | Resp 16 | Wt 137.6 lb

## 2021-09-02 DIAGNOSIS — C9110 Chronic lymphocytic leukemia of B-cell type not having achieved remission: Secondary | ICD-10-CM

## 2021-09-02 DIAGNOSIS — Z79899 Other long term (current) drug therapy: Secondary | ICD-10-CM | POA: Diagnosis not present

## 2021-09-02 DIAGNOSIS — C8590 Non-Hodgkin lymphoma, unspecified, unspecified site: Secondary | ICD-10-CM

## 2021-09-02 LAB — COMPREHENSIVE METABOLIC PANEL
ALT: 16 U/L (ref 0–44)
AST: 28 U/L (ref 15–41)
Albumin: 3.5 g/dL (ref 3.5–5.0)
Alkaline Phosphatase: 95 U/L (ref 38–126)
Anion gap: 8 (ref 5–15)
BUN: 19 mg/dL (ref 8–23)
CO2: 29 mmol/L (ref 22–32)
Calcium: 8.4 mg/dL — ABNORMAL LOW (ref 8.9–10.3)
Chloride: 102 mmol/L (ref 98–111)
Creatinine, Ser: 0.79 mg/dL (ref 0.44–1.00)
GFR, Estimated: >60 mL/min (ref >60)
Glucose, Bld: 120 mg/dL — ABNORMAL HIGH (ref 70–99)
Potassium: 3.1 mmol/L — ABNORMAL LOW (ref 3.5–5.1)
Sodium: 139 mmol/L (ref 135–145)
Total Bilirubin: 1 mg/dL (ref 0.3–1.2)
Total Protein: 6.1 g/dL — ABNORMAL LOW (ref 6.5–8.1)

## 2021-09-02 LAB — CBC WITH DIFFERENTIAL/PLATELET
Abs Immature Granulocytes: 0.08 10*3/uL — ABNORMAL HIGH (ref 0.00–0.07)
Basophils Absolute: 0.1 10*3/uL (ref 0.0–0.1)
Basophils Relative: 1 %
Eosinophils Absolute: 0.1 10*3/uL (ref 0.0–0.5)
Eosinophils Relative: 1 %
HCT: 38.3 % (ref 36.0–46.0)
Hemoglobin: 12 g/dL (ref 12.0–15.0)
Immature Granulocytes: 1 %
Lymphocytes Relative: 44 %
Lymphs Abs: 6.1 10*3/uL — ABNORMAL HIGH (ref 0.7–4.0)
MCH: 25.2 pg — ABNORMAL LOW (ref 26.0–34.0)
MCHC: 31.3 g/dL (ref 30.0–36.0)
MCV: 80.3 fL (ref 80.0–100.0)
Monocytes Absolute: 1.1 10*3/uL — ABNORMAL HIGH (ref 0.1–1.0)
Monocytes Relative: 8 %
Neutro Abs: 5.9 10*3/uL (ref 1.7–7.7)
Neutrophils Relative %: 45 %
Platelets: 268 10*3/uL (ref 150–400)
RBC: 4.77 MIL/uL (ref 3.87–5.11)
RDW: 16.4 % — ABNORMAL HIGH (ref 11.5–15.5)
Smear Review: NORMAL
WBC: 13.2 10*3/uL — ABNORMAL HIGH (ref 4.0–10.5)
nRBC: 0 % (ref 0.0–0.2)

## 2021-09-02 MED ORDER — POTASSIUM CHLORIDE CRYS ER 20 MEQ PO TBCR: 20.0000 meq | EXTENDED_RELEASE_TABLET | Freq: Two times a day (BID) | ORAL | 0 refills | Status: DC

## 2021-09-02 NOTE — Chronic Care Management (AMB) (Signed)
?  Care Management  ? ?Note ? ?09/02/2021 ?Name: Barbara Chambers MRN: [id number] DOB: 09/25/1934 ? ?Barbara Chambers is a 87 y.o. year old female who is a primary care patient of Barbara Chambers, Barbara Bucy, MD and is actively engaged with the care management team. I reached out to Barbara Chambers by phone today to assist with scheduling an initial visit with the Pharmacist ? ?Follow up plan: ?Telephone appointment with care management team member scheduled for: 09/19/2021 ? ?Barbara Chambers, Barbara Chambers ?Care Guide, Embedded Care Coordination ?[facility]  Care Management  ?Direct Dial: [phone] ? ? ?

## 2021-09-02 NOTE — Progress Notes (Signed)
? ?[facility] ?[place]  ?[phone] [phone] ? ?Patient Care Team: ?[name], MD as PCP - General (Family Medicine) ?Lloyd Huger, MD as Consulting Physician (Oncology) ?Corey Skains, MD as Consulting Physician (Cardiology) ?Birder Robson, MD as Referring Physician (Ophthalmology) ?Neldon Labella, RN as Case Manager  ? ?Name of the patient: Barbara Chambers  ?[id number]  ?[birth year]  ? ?Date of visit: 09/02/21 ? ?HPI: Patient is a 87 y.o. female with chronic lymphocytic leukemia. Currently treated with Imbruvica (ibrutinib). Started on 11/23/19. ? ?Reason for Consult: Oral chemotherapy follow-up for Imbruvica (ibrutinib) therapy. ? ? ?PAST MEDICAL HISTORY: ?Past Medical History:  ?Diagnosis Date  ? Chronic lymphocytic leukemia ([place]) 2011  ? Hyperlipidemia   ? Hypertension   ? ? ?PAST SURGICAL HISTORY:  ?Past Surgical History:  ?Procedure Laterality Date  ? cataract surgery Bilateral 2005  ? CHOLECYSTECTOMY  1991  ? RECONSTRUCTION OF EYELID    ? ? ?HEMATOLOGY/ONCOLOGY HISTORY:  ?Oncology History  ? No history exists.  ? ? ?ALLERGIES:  is allergic to hctz [hydrochlorothiazide] and penicillins. ? ?MEDICATIONS:  ?Current Outpatient Medications  ?Medication Sig Dispense Refill  ? atorvastatin (LIPITOR) 40 MG tablet Take 40 mg by mouth daily.    ? COENZYME Q-10 PO Take 200 mg by mouth daily.     ? DULoxetine (CYMBALTA) 20 MG capsule TAKE 1 CAPSULE BY MOUTH EVERY DAY 90 capsule 0  ? furosemide (LASIX) 20 MG tablet Take 20 mg by mouth daily.    ? ibrutinib (IMBRUVICA) 420 MG tablet TAKE 1 TABLET BY MOUTH DAILY. 28 tablet 5  ? metoprolol succinate (TOPROL-XL) 100 MG 24 hr tablet TAKE 1 TABLET BY MOUTH DAILY. TAKE WITH OR IMMEDIATELY FOLLOWING A MEAL. 90 tablet 0  ? OLANZapine (ZYPREXA) 2.5 MG tablet TAKE 1 TABLET BY MOUTH EVERYDAY AT BEDTIME 90 tablet 1  ? rivastigmine (EXELON) 1.5 MG capsule Take 1.5 mg by mouth 2 (two) times daily.    ?  telmisartan (MICARDIS) 40 MG tablet Take 2 tablets (80 mg total) by mouth daily. 180 tablet 3  ? vitamin B-12 (CYANOCOBALAMIN) 1000 MCG tablet Take 1,000 mcg by mouth daily.    ? ?No current facility-administered medications for this visit.  ? ? ?VITAL SIGNS: ?There were no vitals taken for this visit. ?There were no vitals filed for this visit.  ?Estimated body mass index is 26.46 kg/m? as calculated from the following: ?  Height as of 11/19/20: '4\' 11"'$  (1.499 m). ?  Weight as of 06/03/21: 59.4 kg (131 lb). ? ?LABS: ?CBC: ?   ?Component Value Date/Time  ? WBC 12.5 (H) 05/29/2021 0911  ? HGB 12.4 05/29/2021 0911  ? HGB 12.9 09/28/2019 0936  ? HCT 39.9 05/29/2021 0911  ? HCT 41.2 09/28/2019 0936  ? PLT 239 05/29/2021 0911  ? PLT 262 09/28/2019 0936  ? MCV 79.0 (L) 05/29/2021 0911  ? MCV 87 09/28/2019 0936  ? MCV 84 03/19/2014 1000  ? NEUTROABS 6.4 05/29/2021 0911  ? NEUTROABS 6.1 03/01/2019 1050  ? NEUTROABS 4.9 03/19/2014 1000  ? LYMPHSABS 5.0 (H) 05/29/2021 0911  ? LYMPHSABS 17.1 (H) 03/01/2019 1050  ? LYMPHSABS 8.1 (H) 03/19/2014 1000  ? MONOABS 0.9 05/29/2021 0911  ? MONOABS 0.6 03/19/2014 1000  ? EOSABS 0.1 05/29/2021 0911  ? EOSABS 0.0 03/01/2019 1050  ? EOSABS 0.1 03/19/2014 1000  ? BASOSABS 0.0 05/29/2021 0911  ? BASOSABS 0.0 03/01/2019 1050  ? BASOSABS 0.2 (H) 03/19/2014 1000  ? ?Comprehensive Metabolic Panel: ?   ?  Component Value Date/Time  ? NA 139 05/29/2021 0911  ? NA 141 09/28/2019 0936  ? K 4.0 05/29/2021 0911  ? CL 100 05/29/2021 0911  ? CO2 29 05/29/2021 0911  ? BUN 14 05/29/2021 0911  ? BUN 16 09/28/2019 0936  ? CREATININE 0.84 05/29/2021 0911  ? CREATININE 0.78 04/20/2017 0914  ? GLUCOSE 93 05/29/2021 0911  ? CALCIUM 8.7 (L) 05/29/2021 0911  ? AST 26 05/29/2021 0911  ? AST 25 02/15/2012 1101  ? ALT 15 05/29/2021 0911  ? ALT 27 02/15/2012 1101  ? ALKPHOS 94 05/29/2021 0911  ? ALKPHOS 89 02/15/2012 1101  ? BILITOT 0.6 05/29/2021 0911  ? BILITOT 0.4 09/28/2019 0936  ? BILITOT 0.4 02/15/2012 1101  ? PROT  6.6 05/29/2021 0911  ? PROT 6.2 09/28/2019 0936  ? PROT 6.9 02/15/2012 1101  ? ALBUMIN 3.8 05/29/2021 0911  ? ALBUMIN 4.1 09/28/2019 0936  ? ALBUMIN 3.7 02/15/2012 1101  ? ? ?RADIOGRAPHIC STUDIES: ?No results found. ? ?Present during visit: patient only ? ?Assessment and Plan:  ?Reviewed CBC/CMP with patient, plan to continue patient on '420mg'$  ibrutinib.  ?Potassium slight low, potassium supplement prescription sent to patient's local pharmacy ?Overall, Ms. Ofarrell continues to feel well. ?  ?Oral Chemotherapy Side Effect/Intolerance:  ?No reported diarrhea or constipation, edema, headache,  nausea, fatigue, mouth sores ? ?Other:  ?Rash- Patient has a mild rash on her arms and hands, she also reports having it on her legs. Upon chart review, the appears to be a long standing issue predating ibrutinib. Recommended keeping her skin moisturized and following-up with her PCP or dermatologist. She does not endorse itching.  ? ?Oral Chemotherapy Adherence: one missed dose in last month reported ? ?Medication Access Issues:  no issues, fills at [facility] ? ?Patient expressed understanding and was in agreement with this plan. She also understands that She can call clinic at any time with any questions, concerns, or complaints.  ? ?Follow-up: F/u with MD in 2 months, already scheduled ? ?Thank you for allowing me to participate in the care of this very pleasant patient.  ? ?Time Total: 15 mins ? ?Visit consisted of counseling and education on dealing with issues of symptom management in the setting of serious and potentially life-threatening illness.Greater than 50%  of this time was spent counseling and coordinating care related to the above assessment and plan. ? ?Signed by: ?Darl Pikes, PharmD, BCPS, BCOP, CPP ?Hematology/Oncology Clinical Pharmacist Practitioner ?[facility] ?[phone] ? ?09/02/2021 10:27 AM ?

## 2021-09-14 ENCOUNTER — Other Ambulatory Visit: Payer: Self-pay | Admitting: Family Medicine

## 2021-09-16 ENCOUNTER — Telehealth: Payer: Self-pay

## 2021-09-16 NOTE — Progress Notes (Signed)
? ? ?Chronic Care Management ?Pharmacy Assistant  ? ?Name: SHANNAH CONTEH  MRN: [id number] DOB: 07/20/1934 ? ?Initial Visit with Junius Argyle, CPP on 09/19/2021 @ 0930 ?Initial Visit Question Assessment ? ? ? ? ?Chronic Care Management ?Pharmacy Assistant  ? ?Name: CIARA KAGAN  MRN: [id number] DOB: 08-11-1934 ? ?Conditions to be addressed/monitored: ?HTN, HLD, Anxiety, Depression, and Senile Purpura, Atherosclerosis of abdominal aorta, Subclinical Hypothyroidism, Arthritis, Cervical Adenopathy, Chronic Lymphocytic Leukemia, Hallucinations, Obsessional thoughts, Memory loss, Bilateral leg edema  ? ?Primary concerns for visit include: ?Per patient's son [name] (who is on HIPAA) stated he has no primary concerns regarding the visit. ? ?Recent office visits:  ?05/29/2021 Lavon Paganini, MD (PCP Office Visit) for Follow-up- No medication changes noted, lab order placed, Patient to follow-up in 6 months ? ?04/25/2021 Tally Joe, MD (PCP Office Visit) for Hypertension- Changed: Telmisartan from 40 mg daily to 80 mg daily, No orders placed,  patient to follow-up in 3 months ? ?Recent consult visits:  ?06/03/2021 Delight Hoh, MD (Oncology) for CLL- No medication changes noted, No orders placed, No follow-up noted ? ?05/08/2021 Flossie Dibble, MD (Cardiology) for 4 week follow-up- No medication changes noted, No orders placed, Patient to follow-up in 6 months ? ?04/09/2021 Flossie Dibble, MD (Cardiology) for HTN- Stopped: Amlodipine 5 mg tablet, Telmisartan 40 mg, No orders placed, Patient to follow-up in 4 weeks ? ?Hospital visits:  ?None in previous 6 months ? ?Medications: ?Outpatient Encounter Medications as of 09/16/2021  ?Medication Sig  ? atorvastatin (LIPITOR) 40 MG tablet Take 40 mg by mouth daily.  ? COENZYME Q-10 PO Take 200 mg by mouth daily.   ? DULoxetine (CYMBALTA) 20 MG capsule TAKE 1 CAPSULE BY MOUTH EVERY DAY  ? furosemide (LASIX) 20 MG tablet Take 20 mg by mouth daily.  ? ibrutinib (IMBRUVICA)  420 MG tablet TAKE 1 TABLET BY MOUTH DAILY.  ? metoprolol succinate (TOPROL-XL) 100 MG 24 hr tablet TAKE 1 TABLET BY MOUTH DAILY. TAKE WITH OR IMMEDIATELY FOLLOWING A MEAL.  ? OLANZapine (ZYPREXA) 2.5 MG tablet TAKE 1 TABLET BY MOUTH EVERYDAY AT BEDTIME  ? potassium chloride SA (KLOR-CON M) 20 MEQ tablet Take 1 tablet (20 mEq total) by mouth 2 (two) times daily.  ? rivastigmine (EXELON) 1.5 MG capsule Take 1.5 mg by mouth 2 (two) times daily.  ? SODIUM FLUORIDE 5000 PPM 1.1 % PSTE See admin instructions.  ? telmisartan (MICARDIS) 40 MG tablet Take 2 tablets (80 mg total) by mouth daily.  ? vitamin B-12 (CYANOCOBALAMIN) 1000 MCG tablet Take 1,000 mcg by mouth daily.  ? ?No facility-administered encounter medications on file as of 09/16/2021.  ? ?Care Gaps: ?Zoster Vaccine ?COVID-19 Booster 4 ? ?Star Rating Drugs: ?Atorvastatin 40 mg last filled on 07/14/2021 for a 90-Day supply with [facility] ?Telmisartan 40 mg last filled on 07/22/2021 for a 90-Day supply with [facility] ? ?Questions for Clinical Pharmacist:  ? ?1.Are you able to connect with Patient Yes, I was actually able to connect with the patient's son [name] who is on her HIPAA and who's number is listed as the patient's primary number.  ?  ?2.Confirmed appointment date/time with patient/caregiver? Confirm appointment on 09/19/2021 at 0930 with Daron Offer CPP ?  ?3.Visit type telephone ?   ?4.Patient/Caregiver instructed to bring medications to appointment. Patient is aware to bring all medications and supplements to appointment ([name] was advised) ?  ?5.What, if any, problems do you have getting your medications from the pharmacy? None, per  patient's son he takes care of her medications even the cost so their are no barriers. [name] also advised they do not have any current issues regarding the patient's pharmacy.  ?  ?6.What is your top health concern to discuss at your upcoming visit? Per patient's son they have no concerns regarding  her health at this time. He reports that she is pretty much doing well in spite of the patient's health conditions. ?  ?7.Have you seen any other providers since your last visit? No ? ?8. Any side effects from any medications? Per patient's son the medications that the patient currently takes does not cause her any issues. ? ?9. Has your provider asked that you check blood pressure, blood sugar, or follow special diet at home? Per the patient's son the patient checks her blood pressure several times a day. The son advises that the patient is not on a special diet, but she does watch her salt as she really doesn't use salt in her food at all. ? ?Do you get any type of exercise on a regular basis? Per patient's son patient does move around the house often, and she takes walks when she can ? ?Lynann Bologna, CPA/CMA ?Clinical Pharmacist Assistant ?Phone: [phone]  ? ? ?  ?  ?  ? ? ?

## 2021-09-17 ENCOUNTER — Ambulatory Visit (INDEPENDENT_AMBULATORY_CARE_PROVIDER_SITE_OTHER): Payer: PPO

## 2021-09-17 DIAGNOSIS — I10 Essential (primary) hypertension: Secondary | ICD-10-CM

## 2021-09-17 DIAGNOSIS — E782 Mixed hyperlipidemia: Secondary | ICD-10-CM

## 2021-09-17 NOTE — Chronic Care Management (AMB) (Signed)
?Chronic Care Management  ? ?CCM RN Visit Note ? ?09/17/2021 ?Name: Barbara Chambers MRN: [id number] DOB: 10-Feb-1935 ? ?Subjective: ?Barbara Chambers is a 87 y.o. year old female who is a primary care patient of [name], [name], MD. The care management team was consulted for assistance with disease management and care coordination needs.   ? ?Engaged with patient by telephone for follow up visit in response to provider referral for case management and care coordination services.  ? ?Consent to Services:  ?The patient was given information about Chronic Care Management services, agreed to services, and gave verbal consent prior to initiation of services.  Please see initial visit note for detailed documentation.  ? ?Assessment: Review of patient past medical history, allergies, medications, health status, including review of consultants reports, laboratory and other test data, was performed as part of comprehensive evaluation and provision of chronic care management services.  ? ?SDOH (Social Determinants of Health) assessments and interventions performed:  No ? ?CCM Care Plan ? ?Allergies  ?Allergen Reactions  ? Hctz [Hydrochlorothiazide]   ?  Hyponatremia  ? Penicillins Rash  ? ? ?Outpatient Encounter Medications as of 09/17/2021  ?Medication Sig  ? atorvastatin (LIPITOR) 40 MG tablet Take 40 mg by mouth daily.  ? COENZYME Q-10 PO Take 200 mg by mouth daily.   ? DULoxetine (CYMBALTA) 20 MG capsule TAKE 1 CAPSULE BY MOUTH EVERY DAY  ? furosemide (LASIX) 20 MG tablet Take 20 mg by mouth daily.  ? ibrutinib (IMBRUVICA) 420 MG tablet TAKE 1 TABLET BY MOUTH DAILY.  ? metoprolol succinate (TOPROL-XL) 100 MG 24 hr tablet TAKE 1 TABLET BY MOUTH DAILY. TAKE WITH OR IMMEDIATELY FOLLOWING A MEAL.  ? OLANZapine (ZYPREXA) 2.5 MG tablet TAKE 1 TABLET BY MOUTH EVERYDAY AT BEDTIME  ? potassium chloride SA (KLOR-CON M) 20 MEQ tablet Take 1 tablet (20 mEq total) by mouth 2 (two) times daily.  ? rivastigmine (EXELON) 1.5 MG capsule Take  1.5 mg by mouth 2 (two) times daily.  ? SODIUM FLUORIDE 5000 PPM 1.1 % PSTE See admin instructions.  ? telmisartan (MICARDIS) 40 MG tablet Take 2 tablets (80 mg total) by mouth daily.  ? vitamin B-12 (CYANOCOBALAMIN) 1000 MCG tablet Take 1,000 mcg by mouth daily.  ? ?No facility-administered encounter medications on file as of 09/17/2021.  ? ? ?Patient Active Problem List  ? Diagnosis Date Noted  ? Bilateral leg edema 11/09/2019  ? Atherosclerosis of abdominal aorta ([place]) 11/09/2019  ? Senile purpura ([place]) 09/28/2019  ? Cervical adenopathy 09/28/2019  ? Memory loss 09/28/2019  ? Actinic keratosis 09/01/2019  ? Rosacea 09/01/2019  ? Adjustment disorder with mixed anxiety and depressed mood 03/01/2019  ? Obsessional thoughts 03/01/2019  ? Hallucinations 03/01/2019  ? Shortness of breath 07/11/2018  ? Vulvar irritation 07/11/2018  ? Dizziness 05/18/2017  ? Leg cramps 04/20/2017  ? Arthritis 03/08/2015  ? Anxiety 10/25/2014  ? CLL (chronic lymphocytic leukemia) ([place]) 10/25/2014  ? HLD (hyperlipidemia) 10/25/2014  ? Subclinical hypothyroidism 10/25/2014  ? Abnormal ECG 10/11/2014  ? Benign essential HTN 10/11/2014  ? ?  ? ? ?Patient Care Plan: RN Care Management Plan of Care  ?  ? ?Problem Identified: HTN, HLD   ?  ? ?Long-Range Goal: Disease Progression Prevented or Minimized   ?Priority: High  ?Note:   ?Current Barriers:  ?Chronic Disease Management support and education needs related to HTN and HLD ? ?RNCM Clinical Goal(s):  ?Patient will demonstrate ongoing adherence to prescribed treatment plan for HTN and  HLD through collaboration with providers and the care management team.  ? ?Interventions: ?1:1 collaboration with primary care provider regarding development and update of comprehensive plan of care as evidenced by provider attestation and co-signature ?Inter-disciplinary care team collaboration (see longitudinal plan of care) ?Evaluation of current treatment plan related to  self management and patient's adherence  to plan as established by provider ? ?Hypertension Interventions: ?BP Readings from Last 3 Encounters:  ?10/08/21 130/60  ?09/02/21 (!) 133/54  ?06/03/21 (!) 150/56  ?  ?Reviewed plan for hypertension management. Reports taking all medications as prescribed. ?Reviewed established blood pressure parameters. Reports readings have been within the established range. Reports systolic readings have ranged in the 130's. Reports diastolic reading have ranged in the high 50's. Encouraged to continue monitoring and maintain a log.   ?Discussed nutritional intake. Reports good appetite. Reports adhering to the recommended cardiac diet. Encouraged to continue reading nutrition labels, monitoring sodium intake, and avoiding highly processed foods when possible.  ?Reviewed s/sx of heart attack, stroke and worsening symptoms that require immediate medical attention. ? ? ?Hyperlipidemia Interventions: ?Lab Results  ?Component Value Date  ? CHOL 179 09/28/2019  ? HDL 50 09/28/2019  ? LDLCALC 108 (H) 09/28/2019  ? TRIG 119 09/28/2019  ? CHOLHDL 3.6 09/28/2019  ?  ?Medications reviewed ?Reviewed provider established cholesterol goals  ?Discussed importance of regular laboratory monitoring as prescribed ?Reviewed role and benefits of statin for ASCVD risk reduction ?Discussed strategies to manage statin-induced myalgias. Reports tolerating statin well. ?Reviewed importance of limiting foods high in cholesterol ? ? ?Patient Goals/Self-Care Activities: ?Patient will self administer medications as prescribed ?Patient will attend all scheduled provider appointments ?Patient will call pharmacy for medication refills ?Patient will continue to perform ADL's independently ?Patient will continue to perform IADL's independently ?Patient will call provider office for new concerns or questions ? ? ?Follow Up Plan:   ?Will follow up three months ?  ? ? ?PLAN: ?A member of the care management team will follow up in three months. ? ? ?Cas Tracz,RN ?[facility] Care Management ?[place] ?[phone] ? ? ? ? ? ? ? ?

## 2021-09-19 ENCOUNTER — Ambulatory Visit: Payer: PPO

## 2021-09-19 DIAGNOSIS — E782 Mixed hyperlipidemia: Secondary | ICD-10-CM

## 2021-09-19 DIAGNOSIS — R413 Other amnesia: Secondary | ICD-10-CM

## 2021-09-19 DIAGNOSIS — I10 Essential (primary) hypertension: Secondary | ICD-10-CM

## 2021-09-19 DIAGNOSIS — E038 Other specified hypothyroidism: Secondary | ICD-10-CM

## 2021-09-19 DIAGNOSIS — F419 Anxiety disorder, unspecified: Secondary | ICD-10-CM

## 2021-09-19 NOTE — Progress Notes (Signed)
? ?Chronic Care Management ?Pharmacy Note ? ?09/23/2021 ?Name:  Barbara Chambers MRN:  [id number] DOB:  1934-08-14 ? ?Summary: ?Patient presents for initial CCM consult. Today's visit was 100% conducted by the patient's son and caregiver, [name].  ? ?Recommendations/Changes made from today's visit: ?Continue current medications ? ?Plan: ?CPP follow-up 6 months ? ? ?Subjective: ?Barbara Chambers is an 87 y.o. year old female who is a primary patient of [name], [name], MD.  The CCM team was consulted for assistance with disease management and care coordination needs.   ? ?Engaged with patient by telephone for initial visit in response to provider referral for pharmacy case management and/or care coordination services.  ? ?Consent to Services:  ?The patient was given the following information about Chronic Care Management services today, agreed to services, and gave verbal consent: 1. CCM service includes personalized support from designated clinical staff supervised by the primary care provider, including individualized plan of care and coordination with other care providers 2. 24/7 contact phone numbers for assistance for urgent and routine care needs. 3. Service will only be billed when office clinical staff spend 20 minutes or more in a month to coordinate care. 4. Only one practitioner may furnish and bill the service in a calendar month. 5.The patient may stop CCM services at any time (effective at the end of the month) by phone call to the office staff. 6. The patient will be responsible for cost sharing (co-pay) of up to 20% of the service fee (after annual deductible is met). Patient agreed to services and consent obtained. ? ?Patient Care Team: ?[name], MD as PCP - General (Family Medicine) ?Lloyd Huger, MD as Consulting Physician (Oncology) ?Corey Skains, MD as Consulting Physician (Cardiology) ?Birder Robson, MD as Referring Physician (Ophthalmology) ?Neldon Labella, RN as Case  Manager ?Germaine Pomfret, [facility] (Pharmacist) ? ?Recent office visits: ?05/29/2021 Lavon Paganini, MD (PCP Office Visit) for Follow-up- No medication changes noted, lab order placed, Patient to follow-up in 6 months ?  ?04/25/2021 Tally Joe, MD (PCP Office Visit) for Hypertension- Changed: Telmisartan from 40 mg daily to 80 mg daily, No orders placed,  patient to follow-up in 3 months ? ?Recent consult visits: ?06/03/2021 Delight Hoh, MD (Oncology) for CLL- No medication changes noted, No orders placed, No follow-up noted ?  ?05/08/2021 Flossie Dibble, MD (Cardiology) for 4 week follow-up- No medication changes noted, No orders placed, Patient to follow-up in 6 months ?  ?04/09/2021 Flossie Dibble, MD (Cardiology) for HTN- Stopped: Amlodipine 5 mg tablet, Telmisartan 40 mg, No orders placed, Patient to follow-up in 4 weeks ? ?Hospital visits: ?None in previous 6 months ? ? ?Objective: ? ?Lab Results  ?Component Value Date  ? CREATININE 0.79 09/02/2021  ? BUN 19 09/02/2021  ? GFRNONAA >60 09/02/2021  ? GFRAA >60 03/07/2020  ? NA 139 09/02/2021  ? K 3.1 (L) 09/02/2021  ? CALCIUM 8.4 (L) 09/02/2021  ? CO2 29 09/02/2021  ? GLUCOSE 120 (H) 09/02/2021  ? ? ?No results found for: HGBA1C, FRUCTOSAMINE, GFR, MICROALBUR  ?Last diabetic Eye exam: No results found for: HMDIABEYEEXA  ?Last diabetic Foot exam: No results found for: HMDIABFOOTEX  ? ?Lab Results  ?Component Value Date  ? CHOL 134 06/10/2021  ? HDL 63 06/10/2021  ? [place] 58 06/10/2021  ? TRIG 60 06/10/2021  ? CHOLHDL 2.1 06/10/2021  ? ? ? ?  Latest Ref Rng & Units 09/02/2021  ? 10:25 AM 05/29/2021  ?  9:11 AM 02/27/2021  ? 10:59 AM  ?Hepatic Function  ?Total Protein 6.5 - 8.1 g/dL 6.1   6.6   6.9    ?Albumin 3.5 - 5.0 g/dL 3.5   3.8   3.9    ?AST 15 - 41 U/L '28   26   24    ' ?ALT 0 - 44 U/L '16   15   16    ' ?Alk Phosphatase 38 - 126 U/L 95   94   95    ?Total Bilirubin 0.3 - 1.2 mg/dL 1.0   0.6   0.5    ? ? ?Lab Results  ?Component Value Date/Time  ?  TSH 11.300 (H) 06/10/2021 09:08 AM  ? TSH 4.970 (H) 09/28/2019 09:36 AM  ? FREET4 1.07 06/10/2021 09:08 AM  ? FREET4 0.92 09/28/2019 09:36 AM  ? ? ? ?  Latest Ref Rng & Units 09/02/2021  ? 10:25 AM 05/29/2021  ?  9:11 AM 02/27/2021  ? 10:59 AM  ?CBC  ?WBC 4.0 - 10.5 K/uL 13.2   12.5   13.6    ?Hemoglobin 12.0 - 15.0 g/dL 12.0   12.4   12.0    ?Hematocrit 36.0 - 46.0 % 38.3   39.9   38.3    ?Platelets 150 - 400 K/uL 268   239   278    ? ? ?No results found for: VD25OH ? ?Clinical ASCVD: No  ?The ASCVD Risk score (Arnett DK, et al., 2019) failed to calculate for the following reasons: ?  The 2019 ASCVD risk score is only valid for ages 12 to 59   ? ? ?  04/25/2021  ? 11:26 AM 02/07/2021  ?  9:49 AM 10/03/2020  ?  8:27 AM  ?Depression screen PHQ 2/9  ?Decreased Interest 0 0 0  ?Down, Depressed, Hopeless 0 0 0  ?PHQ - 2 Score 0 0 0  ?Altered sleeping 0    ?Tired, decreased energy 0    ?Change in appetite 0    ?Feeling bad or failure about yourself  0    ?Trouble concentrating 0    ?Moving slowly or fidgety/restless 0    ?Suicidal thoughts 0    ?PHQ-9 Score 0    ?  ? ?Social History  ? ?Tobacco Use  ?Smoking Status Never  ?Smokeless Tobacco Never  ? ?BP Readings from Last 3 Encounters:  ?09/02/21 (!) 133/54  ?06/03/21 (!) 150/56  ?05/29/21 140/76  ? ?Pulse Readings from Last 3 Encounters:  ?09/02/21 (!) 56  ?06/03/21 (!) 59  ?05/29/21 73  ? ?Wt Readings from Last 3 Encounters:  ?09/02/21 137 lb 9.6 oz (62.4 kg)  ?06/03/21 131 lb (59.4 kg)  ?05/29/21 135 lb 6.4 oz (61.4 kg)  ? ?BMI Readings from Last 3 Encounters:  ?09/02/21 27.79 kg/m?  ?06/03/21 26.46 kg/m?  ?05/29/21 27.35 kg/m?  ? ? ?Assessment/Interventions: Review of patient past medical history, allergies, medications, health status, including review of consultants reports, laboratory and other test data, was performed as part of comprehensive evaluation and provision of chronic care management services.  ? ?SDOH:  (Social Determinants of Health) assessments and  interventions performed: Yes ?SDOH Interventions   ? ?Flowsheet Row Most Recent Value  ?SDOH Interventions   ?Financial Strain Interventions Intervention Not Indicated  ?Transportation Interventions Intervention Not Indicated  ? ?  ? ?SDOH Screenings  ? ?Alcohol Screen: Low Risk   ? Last Alcohol Screening Score (AUDIT): 0  ?Depression (PHQ2-9): Low Risk   ? PHQ-2 Score: 0  ?  Financial Resource Strain: Low Risk   ? Difficulty of Paying Living Expenses: Not hard at all  ?Food Insecurity: No Food Insecurity  ? Worried About Charity fundraiser in the Last Year: Never true  ? Ran Out of Food in the Last Year: Never true  ?Housing: Low Risk   ? Last Housing Risk Score: 0  ?Physical Activity: Inactive  ? Days of Exercise per Week: 0 days  ? Minutes of Exercise per Session: 0 min  ?Social Connections: Moderately Integrated  ? Frequency of Communication with Friends and Family: More than three times a week  ? Frequency of Social Gatherings with Friends and Family: More than three times a week  ? Attends Religious Services: More than 4 times per year  ? Active Member of Clubs or Organizations: Yes  ? Attends Archivist Meetings: More than 4 times per year  ? Marital Status: Widowed  ?Stress: No Stress Concern Present  ? Feeling of Stress : Not at all  ?Tobacco Use: Low Risk   ? Smoking Tobacco Use: Never  ? Smokeless Tobacco Use: Never  ? Passive Exposure: Not on file  ?Transportation Needs: No Transportation Needs  ? Lack of Transportation (Medical): No  ? Lack of Transportation (Non-Medical): No  ? ? ?CCM Care Plan ? ?Allergies  ?Allergen Reactions  ? Hctz [Hydrochlorothiazide]   ?  Hyponatremia  ? Penicillins Rash  ? ? ?Medications Reviewed Today   ? ? Reviewed by Germaine Pomfret, [place] (Pharmacist) on 09/23/21 at [place] List Status: <None>  ? ?Medication Order Taking? Sig Documenting Provider Last Dose Status Informant  ?atorvastatin (LIPITOR) 40 MG tablet [id number] Yes Take 40 mg by mouth daily. [provider] Taking Active   ?COENZYME Q-10 PO [id number] Yes Take 200 mg by mouth daily.  [provider] Taking Active Family Member  ?         ?Med Note Waldo Laine, LATASHA N   Tue Dec 06, 2018  8:12 AM

## 2021-09-23 ENCOUNTER — Other Ambulatory Visit (HOSPITAL_COMMUNITY): Payer: Self-pay

## 2021-09-23 NOTE — Patient Instructions (Signed)
Visit Information ?It was great speaking with you today!  Please let me know if you have any questions about our visit. ? ? Goals Addressed   ? ?  ?  ?  ?  ? This Visit's Progress  ?  Track and Manage My Blood Pressure-Hypertension     ?  Timeframe:  Long-Range Goal ?Priority:  High ?Start Date: 09/23/21                             ?Expected End Date: 09/24/22                     ? ?Follow Up within 90 days ?  ?- check blood pressure 3 times per week  ?  ?Why is this important?   ?You won't feel high blood pressure, but it can still hurt your blood vessels.  ?High blood pressure can cause heart or kidney problems. It can also cause a stroke.  ?Making lifestyle changes like losing a little weight or eating less salt will help.  ?Checking your blood pressure at home and at different times of the day can help to control blood pressure.  ?If the doctor prescribes medicine remember to take it the way the doctor ordered.  ?Call the office if you cannot afford the medicine or if there are questions about it.   ?  ?Notes:  ?  ? ?  ? ?Patient Care Plan: General Pharmacy (Adult)  ?  ? ?Problem Identified: Hypertension, Hyperlipidemia, Hypothyroidism, Anxiety, and Leukemia   ?Priority: High  ?  ? ?Long-Range Goal: Patient-Specific Goal   ?Start Date: 09/23/2021  ?Expected End Date: 09/24/2022  ?This Visit's Progress: On track  ?Priority: High  ?Note:   ?Current Barriers:  ?No barriers noted ? ?Pharmacist Clinical Goal(s):  ?Patient will maintain control of blood pressure as evidenced by BP less than 140/90  through collaboration with PharmD and provider.  ? ?Interventions: ?1:1 collaboration with [name], [name], MD regarding development and update of comprehensive plan of care as evidenced by provider attestation and co-signature ?Inter-disciplinary care team collaboration (see longitudinal plan of care) ?Comprehensive medication review performed; medication list updated in electronic medical record ? ?Hypertension (BP goal  <140/90) ?-Controlled ?-Current treatment: ?Furosemide 20 mg daily: Appropriate, Effective, Safe, Accessible ?Metoprolol XL 100 mg daily: Appropriate, Effective, Safe, Accessible  ?Telmisartan 40 mg 2 tablets daily: Appropriate, Effective, Safe, Accessible  ?-Medications previously tried: NA  ?-Current home readings: 140/60  ?-Current dietary habits: Limits processed foods, limits salt. Eats out multiple times weekly. Whole wheat  ?-Current exercise habits: Stays active walking within her house.  ?-Denies hypotensive/hypertensive symptoms ?-Recommended to continue current medication ? ?Hyperlipidemia: (LDL goal < 70) ?-Controlled ?-Current treatment: ?Atorvastatin 40 mg daily: Appropriate, Effective, Safe, Accessible  ?-Medications previously tried: NA  ?-Recommended to continue current medication ? ?Dementia/Anxiety (Goal: Maintain symptom remission) ?-Controlled ?-managed by Dr. Manuella Ghazi, follow-up scheduled May 2023  ?-Current treatment: ?Olanzapine 2.5 mg nightly: Appropriate, Effective, Safe, Accessible  ?Duloxetine 20 mg daily: Appropriate, Effective, Safe, Accessible  ?Rivastigmine 1.5 mg daily: Appropriate, Effective, Safe, Accessible  ?-Medications previously tried/failed: NA ?-Recommended to continue current medication ? ?Patient Goals/Self-Care Activities ?Patient will:  ?- check blood pressure weekly, document, and provide at future appointments ? ?Follow Up Plan: Telephone follow up appointment with care management team member scheduled for:  03/24/2022 at 9:15 AM ?  ? ?Ms. Felker was given information about Chronic Care Management services today including:  ?  CCM service includes personalized support from designated clinical staff supervised by her physician, including individualized plan of care and coordination with other care providers ?24/7 contact phone numbers for assistance for urgent and routine care needs. ?Standard insurance, coinsurance, copays and deductibles apply for chronic care management only  during months in which we provide at least 20 minutes of these services. Most insurances cover these services at 100%, however patients may be responsible for any copay, coinsurance and/or deductible if applicable. This service may help you avoid the need for more expensive face-to-face services. ?Only one practitioner may furnish and bill the service in a calendar month. ?The patient may stop CCM services at any time (effective at the end of the month) by phone call to the office staff. ? ?Patient agreed to services and verbal consent obtained.  ? ?The patient verbalized understanding of instructions, educational materials, and care plan provided today and declined offer to receive copy of patient instructions, educational materials, and care plan.  ? ?Junius Argyle, PharmD, BCACP, CPP  ?Clinical Pharmacist Practitioner  ?[place] ?[phone]  ?

## 2021-09-25 ENCOUNTER — Other Ambulatory Visit (HOSPITAL_COMMUNITY): Payer: Self-pay

## 2021-09-26 DIAGNOSIS — E785 Hyperlipidemia, unspecified: Secondary | ICD-10-CM

## 2021-09-26 DIAGNOSIS — I10 Essential (primary) hypertension: Secondary | ICD-10-CM

## 2021-09-26 DIAGNOSIS — F0390 Unspecified dementia without behavioral disturbance: Secondary | ICD-10-CM | POA: Diagnosis not present

## 2021-10-03 ENCOUNTER — Other Ambulatory Visit: Payer: Self-pay | Admitting: Family Medicine

## 2021-10-03 DIAGNOSIS — B89 Unspecified parasitic disease: Secondary | ICD-10-CM

## 2021-10-08 ENCOUNTER — Ambulatory Visit (INDEPENDENT_AMBULATORY_CARE_PROVIDER_SITE_OTHER): Payer: PPO

## 2021-10-08 VITALS — BP 130/60 | HR 71 | Temp 96.3°F | Ht 60.0 in | Wt 136.8 lb

## 2021-10-08 DIAGNOSIS — Z0000 Encounter for general adult medical examination without abnormal findings: Secondary | ICD-10-CM

## 2021-10-08 NOTE — Patient Instructions (Addendum)
Ms. Sterba , ?Thank you for taking time to come for your Medicare Wellness Visit. I appreciate your ongoing commitment to your health goals. Please review the following plan we discussed and let me know if I can assist you in the future.  ? ?Screening recommendations/referrals: ?Colonoscopy: aged out ?Mammogram: aged out ?Bone Density: aged out, referral declined ?Recommended yearly ophthalmology/optometry visit for glaucoma screening and checkup ?Recommended yearly dental visit for hygiene and checkup ? ?Vaccinations: ?Influenza vaccine: 04/25/21 ?Pneumococcal vaccine: 04/20/17 ?Tdap vaccine: 04/05/17 ?Shingles vaccine: n/d   ?Covid-19:09/02/19, 03/07/20, 11/19/20 ? ?Advanced directives: no ? ?Conditions/risks identified: none ? ?Next appointment: Follow up in one year for your annual wellness visit - 10/13/22 @ 8:45am in person ? ? ?Preventive Care 50 Years and Older, Female ?Preventive care refers to lifestyle choices and visits with your health care provider that can promote health and wellness. ?What does preventive care include? ?A yearly physical exam. This is also called an annual well check. ?Dental exams once or twice a year. ?Routine eye exams. Ask your health care provider how often you should have your eyes checked. ?Personal lifestyle choices, including: ?Daily care of your teeth and gums. ?Regular physical activity. ?Eating a healthy diet. ?Avoiding tobacco and drug use. ?Limiting alcohol use. ?Practicing safe sex. ?Taking low-dose aspirin every day. ?Taking vitamin and mineral supplements as recommended by your health care provider. ?What happens during an annual well check? ?The services and screenings done by your health care provider during your annual well check will depend on your age, overall health, lifestyle risk factors, and family history of disease. ?Counseling  ?Your health care provider may ask you questions about your: ?Alcohol use. ?Tobacco use. ?Drug use. ?Emotional well-being. ?Home and  relationship well-being. ?Sexual activity. ?Eating habits. ?History of falls. ?Memory and ability to understand (cognition). ?Work and work Statistician. ?Reproductive health. ?Screening  ?You may have the following tests or measurements: ?Height, weight, and BMI. ?Blood pressure. ?Lipid and cholesterol levels. These may be checked every 5 years, or more frequently if you are over 52 years old. ?Skin check. ?Lung cancer screening. You may have this screening every year starting at age 66 if you have a 30-pack-year history of smoking and currently smoke or have quit within the past 15 years. ?Fecal occult blood test (FOBT) of the stool. You may have this test every year starting at age 72. ?Flexible sigmoidoscopy or colonoscopy. You may have a sigmoidoscopy every 5 years or a colonoscopy every 10 years starting at age 78. ?Hepatitis C blood test. ?Hepatitis B blood test. ?Sexually transmitted disease (STD) testing. ?Diabetes screening. This is done by checking your blood sugar (glucose) after you have not eaten for a while (fasting). You may have this done every 1-3 years. ?Bone density scan. This is done to screen for osteoporosis. You may have this done starting at age 80. ?Mammogram. This may be done every 1-2 years. Talk to your health care provider about how often you should have regular mammograms. ?Talk with your health care provider about your test results, treatment options, and if necessary, the need for more tests. ?Vaccines  ?Your health care provider may recommend certain vaccines, such as: ?Influenza vaccine. This is recommended every year. ?Tetanus, diphtheria, and acellular pertussis (Tdap, Td) vaccine. You may need a Td booster every 10 years. ?Zoster vaccine. You may need this after age 25. ?Pneumococcal 13-valent conjugate (PCV13) vaccine. One dose is recommended after age 21. ?Pneumococcal polysaccharide (PPSV23) vaccine. One dose is recommended after age 48. ?  Talk to your health care provider  about which screenings and vaccines you need and how often you need them. ?This information is not intended to replace advice given to you by your health care provider. Make sure you discuss any questions you have with your health care provider. ?Document Released: 07/12/2015 Document Revised: 03/04/2016 Document Reviewed: 04/16/2015 ?Elsevier Interactive Patient Education ? 2017 [place]. ? ?Fall Prevention in the Home ?Falls can cause injuries. They can happen to people of all ages. There are many things you can do to make your home safe and to help prevent falls. ?What can I do on the outside of my home? ?Regularly fix the edges of walkways and driveways and fix any cracks. ?Remove anything that might make you trip as you walk through a door, such as a raised step or threshold. ?Trim any bushes or trees on the path to your home. ?Use bright outdoor lighting. ?Clear any walking paths of anything that might make someone trip, such as rocks or tools. ?Regularly check to see if handrails are loose or broken. Make sure that both sides of any steps have handrails. ?Any raised decks and porches should have guardrails on the edges. ?Have any leaves, snow, or ice cleared regularly. ?Use sand or salt on walking paths during winter. ?Clean up any spills in your garage right away. This includes oil or grease spills. ?What can I do in the bathroom? ?Use night lights. ?Install grab bars by the toilet and in the tub and shower. Do not use towel bars as grab bars. ?Use non-skid mats or decals in the tub or shower. ?If you need to sit down in the shower, use a plastic, non-slip stool. ?Keep the floor dry. Clean up any water that spills on the floor as soon as it happens. ?Remove soap buildup in the tub or shower regularly. ?Attach bath mats securely with double-sided non-slip rug tape. ?Do not have throw rugs and other things on the floor that can make you trip. ?What can I do in the bedroom? ?Use night lights. ?Make sure  that you have a light by your bed that is easy to reach. ?Do not use any sheets or blankets that are too big for your bed. They should not hang down onto the floor. ?Have a firm chair that has side arms. You can use this for support while you get dressed. ?Do not have throw rugs and other things on the floor that can make you trip. ?What can I do in the kitchen? ?Clean up any spills right away. ?Avoid walking on wet floors. ?Keep items that you use a lot in easy-to-reach places. ?If you need to reach something above you, use a strong step stool that has a grab bar. ?Keep electrical cords out of the way. ?Do not use floor polish or wax that makes floors slippery. If you must use wax, use non-skid floor wax. ?Do not have throw rugs and other things on the floor that can make you trip. ?What can I do with my stairs? ?Do not leave any items on the stairs. ?Make sure that there are handrails on both sides of the stairs and use them. Fix handrails that are broken or loose. Make sure that handrails are as long as the stairways. ?Check any carpeting to make sure that it is firmly attached to the stairs. Fix any carpet that is loose or worn. ?Avoid having throw rugs at the top or bottom of the stairs. If you do have throw  rugs, attach them to the floor with carpet tape. ?Make sure that you have a light switch at the top of the stairs and the bottom of the stairs. If you do not have them, ask someone to add them for you. ?What else can I do to help prevent falls? ?Wear shoes that: ?Do not have high heels. ?Have rubber bottoms. ?Are comfortable and fit you well. ?Are closed at the toe. Do not wear sandals. ?If you use a stepladder: ?Make sure that it is fully opened. Do not climb a closed stepladder. ?Make sure that both sides of the stepladder are locked into place. ?Ask someone to hold it for you, if possible. ?Clearly mark and make sure that you can see: ?Any grab bars or handrails. ?First and last steps. ?Where the edge of  each step is. ?Use tools that help you move around (mobility aids) if they are needed. These include: ?Canes. ?Walkers. ?Scooters. ?Crutches. ?Turn on the lights when you go into a dark area. Replace any light

## 2021-10-08 NOTE — Progress Notes (Signed)
? ?Subjective:  ? Barbara Chambers is a 87 y.o. female who presents for Medicare Annual (Subsequent) preventive examination. ? ?Review of Systems    ? ?  ? ?   ?Objective:  ?  ?Today's Vitals  ? 10/08/21 0859  ?BP: 130/60  ?Pulse: 71  ?Temp: (!) 96.3 ?F (35.7 ?C)  ?TempSrc: Skin  ?SpO2: 99%  ?Weight: 136 lb 12.8 oz (62.1 kg)  ?Height: 5' (1.524 m)  ? ?Body mass index is 26.72 kg/m?. ? ? ?  09/02/2021  ? 10:39 AM 10/03/2020  ?  8:30 AM 05/20/2020  ?  1:13 PM 04/09/2020  ? 12:38 PM 02/08/2020  ?  3:01 PM 12/13/2019  ?  1:18 PM 11/17/2019  ? 10:53 AM  ?Advanced Directives  ?Does Patient Have a Medical Advance Directive? Yes Yes Yes Yes No Yes Yes  ?Type of Corporate treasurer of [place];Living will [place];Living will [place];Living will  [place];Living will   ?Does patient want to make changes to medical advance directive? No - Patient declined  Yes (MAU/Ambulatory/Procedural Areas - Information given) Yes (MAU/Ambulatory/Procedural Areas - Information given) No - Patient declined No - Patient declined No - Patient declined  ?Copy of [place] in Chart?  No - copy requested    No - copy requested   ?Would patient like information on creating a medical advance directive? No - Patient declined     No - Patient declined No - Patient declined  ? ? ?Current Medications (verified) ?Outpatient Encounter Medications as of 10/08/2021  ?Medication Sig  ? furosemide (LASIX) 20 MG tablet Take 1 tablet by mouth daily.  ? atorvastatin (LIPITOR) 40 MG tablet Take 40 mg by mouth daily.  ? COENZYME Q-10 PO Take 200 mg by mouth daily.   ? DULoxetine (CYMBALTA) 20 MG capsule TAKE 1 CAPSULE BY MOUTH EVERY DAY  ? furosemide (LASIX) 20 MG tablet Take 20 mg by mouth daily.  ? ibrutinib (IMBRUVICA) 420 MG tablet TAKE 1 TABLET BY MOUTH DAILY.  ? metoprolol succinate (TOPROL-XL) 100 MG 24 hr tablet TAKE 1 TABLET BY MOUTH DAILY. TAKE WITH OR IMMEDIATELY  FOLLOWING A MEAL.  ? OLANZapine (ZYPREXA) 2.5 MG tablet TAKE 1 TABLET BY MOUTH EVERYDAY AT BEDTIME  ? rivastigmine (EXELON) 1.5 MG capsule Take 1.5 mg by mouth 2 (two) times daily.  ? SODIUM FLUORIDE 5000 PPM 1.1 % PSTE See admin instructions.  ? telmisartan (MICARDIS) 40 MG tablet Take 2 tablets (80 mg total) by mouth daily.  ? vitamin B-12 (CYANOCOBALAMIN) 1000 MCG tablet Take 1,000 mcg by mouth daily.  ? ?No facility-administered encounter medications on file as of 10/08/2021.  ? ? ?Allergies (verified) ?Hctz [hydrochlorothiazide] and Penicillins  ? ?History: ?Past Medical History:  ?Diagnosis Date  ? Chronic lymphocytic leukemia ([place]) 2011  ? Hyperlipidemia   ? Hypertension   ? ?Past Surgical History:  ?Procedure Laterality Date  ? cataract surgery Bilateral 2005  ? CHOLECYSTECTOMY  1991  ? RECONSTRUCTION OF EYELID    ? ?Family History  ?Problem Relation Age of Onset  ? Hypertension Mother   ? Stroke Mother   ? CAD Mother   ? Hypertension Father   ? Heart attack Father   ? Hypertension Brother   ? Seizures Brother   ? Hypertension Son   ? Breast cancer Neg Hx   ? ?Social History  ? ?Socioeconomic History  ? Marital status: Widowed  ?  Spouse name: Barbara Chambers  ?  Number of children: 3  ? Years of education: Not on file  ? Highest education level: 12th grade  ?Occupational History  ? Occupation: retired  ?Tobacco Use  ? Smoking status: Never  ? Smokeless tobacco: Never  ?Vaping Use  ? Vaping Use: Never used  ?Substance and Sexual Activity  ? Alcohol use: No  ? Drug use: No  ? Sexual activity: Not on file  ?Other Topics Concern  ? Not on file  ?Social History Narrative  ? Pt has 1 son that was killed in a car accident at age 83.   ? [name] is primary caregiver  ? ?Social Determinants of Health  ? ?Financial Resource Strain: Low Risk   ? Difficulty of Paying Living Expenses: Not hard at all  ?Food Insecurity: No Food Insecurity  ? Worried About Charity fundraiser in the Last Year: Never true  ? Ran Out of Food in the  Last Year: Never true  ?Transportation Needs: No Transportation Needs  ? Lack of Transportation (Medical): No  ? Lack of Transportation (Non-Medical): No  ?Physical Activity: Not on file  ?Stress: Not on file  ?Social Connections: Not on file  ? ? ?Tobacco Counseling ?Counseling given: Not Answered ? ? ?Clinical Intake: ? ?  ? ?Pain : No/denies pain ? ?  ? ?Nutritional Risks: None ?Diabetes: No ? ?  ? ?Diabetic?no ? ?Interpreter Needed?: No ? ?Information entered by :: Kirke Shaggy LPN ? ? ?Activities of Daily Living ?   ? View : No data to display.  ?  ?  ?  ? ? ?Patient Care Team: ?[name], MD as PCP - General (Family Medicine) ?Lloyd Huger, MD as Consulting Physician (Oncology) ?Corey Skains, MD as Consulting Physician (Cardiology) ?Birder Robson, MD as Referring Physician (Ophthalmology) ?Neldon Labella, RN as Case Manager ?Germaine Pomfret, [facility] (Pharmacist) ? ?Indicate any recent Medical Services you may have received from other than [facility] providers in the past year (date may be approximate). ? ?   ?Assessment:  ? This is a routine wellness examination for Barbara Chambers. ? ?Hearing/Vision screen ?No results found. ? ?Dietary issues and exercise activities discussed: ?  ? ? Goals Addressed   ?None ?  ? ?Depression Screen ? ?  04/25/2021  ? 11:26 AM 02/07/2021  ?  9:49 AM 10/03/2020  ?  8:27 AM 05/27/2020  ?  9:26 AM 09/01/2019  ?  2:34 PM 07/13/2019  ?  9:27 AM 05/11/2019  ? 10:23 AM  ?PHQ 2/9 Scores  ?PHQ - 2 Score 0 0 0 0 0 0 0  ?PHQ- 9 Score 0   1 2  0  ?  ?Fall Risk ? ?  02/07/2021  ?  9:58 AM 10/03/2020  ?  8:30 AM 05/27/2020  ?  9:25 AM 07/13/2019  ?  9:27 AM 07/11/2018  ?  9:30 AM  ?Fall Risk   ?Falls in the past year? 0 '1 1 1 1  '$ ?Number falls in past yr: 0 0 0 0 0  ?Injury with Fall?  0 0 0 0  ?Risk for fall due to : Medication side effect  No Fall Risks    ?Follow up Falls prevention discussed Falls prevention discussed Falls evaluation completed Falls prevention discussed Falls  prevention discussed  ? ? ?FALL RISK PREVENTION PERTAINING TO THE HOME: ? ?Any stairs in or around the home? No  ?If so, are there any without handrails? No  ?Home free of loose throw rugs in  walkways, pet beds, electrical cords, etc? Yes  ?Adequate lighting in your home to reduce risk of falls? Yes  ? ?ASSISTIVE DEVICES UTILIZED TO PREVENT FALLS: ? ?Life alert? No  ?Use of a cane, walker or w/c? No  ?Grab bars in the bathroom? Yes  ?Shower chair or bench in shower? No  ?Elevated toilet seat or a handicapped toilet? Yes  ? ?TIMED UP AND GO: ? ?Was the test performed? Yes .  ?Length of time to ambulate 10 feet: 4 sec.  ? ?Gait steady and fast without use of assistive device ? ?Cognitive Function: ?  ?  ? ?  01/20/2019  ? 11:50 AM 06/24/2016  ?  9:13 AM  ?6CIT Screen  ?What Year? 0 points 0 points  ?What month? 0 points 0 points  ?What time? 0 points 0 points  ?Count back from 20 0 points 0 points  ?Months in reverse 0 points 0 points  ?Repeat phrase 0 points 2 points  ?Total Score 0 points 2 points  ? ? ?Immunizations ?Immunization History  ?Administered Date(s) Administered  ? Fluad Quad(high Dose 65+) 03/17/2019, 03/07/2020, 04/25/2021  ? Influenza, High Dose Seasonal PF 06/24/2016, 04/20/2017, 05/04/2018  ? PFIZER Comirnaty(Gray Top)Covid-19 Tri-Sucrose Vaccine 11/19/2020  ? PFIZER(Purple Top)SARS-COV-2 Vaccination 09/02/2019, 03/07/2020  ? Pneumococcal Conjugate-13 03/30/2014  ? Pneumococcal Polysaccharide-23 04/20/2017  ? Td 04/05/2017  ? Tdap 07/14/2005  ? ? ?TDAP status: Up to date ? ?Flu Vaccine status: Up to date ? ?Pneumococcal vaccine status: Up to date ? ?Covid-19 vaccine status: Completed vaccines ? ?Qualifies for Shingles Vaccine? Yes   ?Zostavax completed No   ?Shingrix Completed?: No.    Education has been provided regarding the importance of this vaccine. Patient has been advised to call insurance company to determine out of pocket expense if they have not yet received this vaccine. Advised may also  receive vaccine at local pharmacy or Health Dept. Verbalized acceptance and understanding. ? ?Screening Tests ?Health Maintenance  ?Topic Date Due  ? Zoster Vaccines- Shingrix (1 of 2) Never done  ? [place]

## 2021-10-17 ENCOUNTER — Other Ambulatory Visit (HOSPITAL_COMMUNITY): Payer: Self-pay

## 2021-10-24 ENCOUNTER — Other Ambulatory Visit (HOSPITAL_COMMUNITY): Payer: Self-pay

## 2021-11-01 NOTE — Progress Notes (Signed)
?[place]  ?[phone]  [phone]  ? ? ? ?Barbara Chambers DOB: 09-11-1934  MR#: [id number]  SWN#:[id number] ? ?Patient Care Team: ?[name], MD as PCP - General (Family Medicine) ?Lloyd Huger, MD as Consulting Physician (Oncology) ?Corey Skains, MD as Consulting Physician (Cardiology) ?Birder Robson, MD as Referring Physician (Ophthalmology) ?Neldon Labella, RN as Case Manager ?Germaine Pomfret, [facility] (Pharmacist) ? ? ?CHIEF COMPLAINT: CLL. ? ?INTERVAL HISTORY: Patient returns to clinic today for repeat laboratory work and further evaluation.  She continues to feel well and remains asymptomatic.  She continues to tolerate Imbruvica without significant side effects. She denies any weakness or fatigue.  She has no neurologic complaints. She denies any fevers, night sweats, or weight loss. She denies any chest pain, shortness of breath, cough, or hemoptysis.  She denies any nausea, vomiting, constipation, or diarrhea. She has no urinary complaints.  Patient feels at her baseline offers no specific complaints today. ? ?REVIEW OF SYSTEMS:   ?Review of Systems  ?Constitutional: Negative.  Negative for diaphoresis, fever, malaise/fatigue and weight loss.  ?Respiratory: Negative.  Negative for cough and shortness of breath.   ?Cardiovascular: Negative.  Negative for chest pain and leg swelling.  ?Gastrointestinal: Negative.  Negative for abdominal pain.  ?Genitourinary: Negative.  Negative for dysuria.  ?Musculoskeletal: Negative.  Negative for back pain.  ?Skin: Negative.  Negative for rash.  ?Neurological: Negative.  Negative for dizziness, sensory change, focal weakness, weakness and headaches.  ?Psychiatric/Behavioral: Negative.  Negative for depression and memory loss. The patient is not nervous/anxious.   ? ?As per HPI. Otherwise, a complete review of systems is negative. ? ?PAST MEDICAL HISTORY: ?Past Medical History:  ?Diagnosis Date  ? Chronic  lymphocytic leukemia ([place]) 2011  ? Hyperlipidemia   ? Hypertension   ? ? ?PAST SURGICAL HISTORY: ?Past Surgical History:  ?Procedure Laterality Date  ? cataract surgery Bilateral 2005  ? CHOLECYSTECTOMY  1991  ? RECONSTRUCTION OF EYELID    ? ? ?FAMILY HISTORY ?Family History  ?Problem Relation Age of Onset  ? Hypertension Mother   ? Stroke Mother   ? CAD Mother   ? Hypertension Father   ? Heart attack Father   ? Hypertension Brother   ? Seizures Brother   ? Hypertension Son   ? Breast cancer Neg Hx   ? ? ?GYNECOLOGIC HISTORY:  No LMP recorded. Patient is postmenopausal.  ? ?  ?ADVANCED DIRECTIVES:  ? ? ?HEALTH MAINTENANCE: ?Social History  ? ?Tobacco Use  ? Smoking status: Never  ? Smokeless tobacco: Never  ?Vaping Use  ? Vaping Use: Never used  ?Substance Use Topics  ? Alcohol use: No  ? Drug use: No  ? ? ? Colonoscopy: ? PAP: ? Bone density: ? Lipid panel: ? ?Allergies  ?Allergen Reactions  ? Hctz [Hydrochlorothiazide]   ?  Hyponatremia  ? Penicillins Rash  ? ? ?Current Outpatient Medications  ?Medication Sig Dispense Refill  ? atorvastatin (LIPITOR) 40 MG tablet Take 40 mg by mouth daily.    ? COENZYME Q-10 PO Take 200 mg by mouth daily.     ? DULoxetine (CYMBALTA) 20 MG capsule TAKE 1 CAPSULE BY MOUTH EVERY DAY 90 capsule 0  ? furosemide (LASIX) 20 MG tablet Take 20 mg by mouth daily.    ? furosemide (LASIX) 20 MG tablet Take 1 tablet by mouth daily.    ? ibrutinib (IMBRUVICA) 420 MG tablet TAKE 1 TABLET BY MOUTH DAILY. 28 tablet 5  ?  metoprolol succinate (TOPROL-XL) 100 MG 24 hr tablet TAKE 1 TABLET BY MOUTH DAILY. TAKE WITH OR IMMEDIATELY FOLLOWING A MEAL. 90 tablet 0  ? OLANZapine (ZYPREXA) 2.5 MG tablet TAKE 1 TABLET BY MOUTH EVERYDAY AT BEDTIME 90 tablet 1  ? rivastigmine (EXELON) 1.5 MG capsule Take 1.5 mg by mouth 2 (two) times daily.    ? SODIUM FLUORIDE 5000 PPM 1.1 % PSTE See admin instructions.    ? telmisartan (MICARDIS) 40 MG tablet Take 2 tablets (80 mg total) by mouth daily. 180 tablet 3  ?  vitamin B-12 (CYANOCOBALAMIN) 1000 MCG tablet Take 1,000 mcg by mouth daily.    ? ?No current facility-administered medications for this visit.  ? ? ?OBJECTIVE: ?BP (!) 114/52 (BP Location: Left Arm, Patient Position: Sitting, Cuff Size: Normal)   Pulse 73   Temp 97.7 ?F (36.5 ?C) (Tympanic)   Resp 16   Ht 5' (1.524 m)   Wt 138 lb 11.2 oz (62.9 kg)   SpO2 99%   BMI 27.09 kg/m?    Body mass index is 27.09 kg/m?[name]    ECOG FS:0 - Asymptomatic  ? ?General: Well-developed, well-nourished, no acute distress. ?Eyes: Pink conjunctiva, anicteric sclera. ?HEENT: Normocephalic, moist mucous membranes. ?Lungs: No audible wheezing or coughing. ?Heart: Regular rate and rhythm. ?Abdomen: Soft, nontender, no obvious distention. ?Musculoskeletal: No edema, cyanosis, or clubbing. ?Neuro: Alert, answering all questions appropriately. Cranial nerves grossly intact. ?Skin: No rashes or petechiae noted. ?Psych: Normal affect. ? ? ?LAB RESULTS: ? ?Appointment on 11/04/2021  ?Component Date Value Ref Range Status  ? Sodium 11/04/2021 135  135 - 145 mmol/L Final  ? Potassium 11/04/2021 3.8  3.5 - 5.1 mmol/L Final  ? Chloride 11/04/2021 104  98 - 111 mmol/L Final  ? CO2 11/04/2021 25  22 - 32 mmol/L Final  ? Glucose, Bld 11/04/2021 129 (H)  70 - 99 mg/dL Final  ? Glucose reference range applies only to samples taken after fasting for at least 8 hours.  ? BUN 11/04/2021 20  8 - 23 mg/dL Final  ? Creatinine, Ser 11/04/2021 0.92  0.44 - 1.00 mg/dL Final  ? Calcium 11/04/2021 8.2 (L)  8.9 - 10.3 mg/dL Final  ? Total Protein 11/04/2021 6.0 (L)  6.5 - 8.1 g/dL Final  ? Albumin 11/04/2021 3.3 (L)  3.5 - 5.0 g/dL Final  ? AST 11/04/2021 28  15 - 41 U/L Final  ? ALT 11/04/2021 16  0 - 44 U/L Final  ? Alkaline Phosphatase 11/04/2021 87  38 - 126 U/L Final  ? Total Bilirubin 11/04/2021 0.9  0.3 - 1.2 mg/dL Final  ? GFR, Estimated 11/04/2021 >60  >60 mL/min Final  ? Comment: (NOTE) ?Calculated using the CKD-EPI Creatinine Equation (2021) ?  ?  Anion gap 11/04/2021 6  5 - 15 Final  ? Performed at [facility], [address] [place], [place] [address]  ? WBC 11/04/2021 12.6 (H)  4.0 - 10.5 K/uL Final  ? RBC 11/04/2021 4.41  3.87 - 5.11 MIL/uL Final  ? Hemoglobin 11/04/2021 11.0 (L)  12.0 - 15.0 g/dL Final  ? HCT 11/04/2021 35.3 (L)  36.0 - 46.0 % Final  ? MCV 11/04/2021 80.0  80.0 - 100.0 fL Final  ? MCH 11/04/2021 24.9 (L)  26.0 - 34.0 pg Final  ? MCHC 11/04/2021 31.2  30.0 - 36.0 g/dL Final  ? RDW 11/04/2021 15.2  11.5 - 15.5 % Final  ? Platelets 11/04/2021 229  150 - 400 K/uL Final  ?  nRBC 11/04/2021 0.0  0.0 - 0.2 % Final  ? Neutrophils Relative % 11/04/2021 48  % Final  ? Neutro Abs 11/04/2021 6.1  1.7 - 7.7 K/uL Final  ? Lymphocytes Relative 11/04/2021 41  % Final  ? Lymphs Abs 11/04/2021 5.2 (H)  0.7 - 4.0 K/uL Final  ? Monocytes Relative 11/04/2021 9  % Final  ? Monocytes Absolute 11/04/2021 1.1 (H)  0.1 - 1.0 K/uL Final  ? Eosinophils Relative 11/04/2021 1  % Final  ? Eosinophils Absolute 11/04/2021 0.1  0.0 - 0.5 K/uL Final  ? Basophils Relative 11/04/2021 0  % Final  ? Basophils Absolute 11/04/2021 0.0  0.0 - 0.1 K/uL Final  ? Immature Granulocytes 11/04/2021 1  % Final  ? Abs Immature Granulocytes 11/04/2021 0.07  0.00 - 0.07 K/uL Final  ? Performed at [facility], [address], [place], [place] [address]  ? ? ?STUDIES: ?No results found. ? ? ?ASSESSMENT: CLL. ? ?PLAN:   ? ?1.  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  Patient initiated Imbruvica in May 2021.  Patient's white blood cell count remains mildly elevated at 12.6, but this is relatively unchanged since June 2022.  Her most recent imaging on May 29, 2021 reviewed independently with essentially stable lymphadenopathy.  Consider repeat CT scan in December 2023.  Continue Imbruvica as prescribed.  Return to clinic in 3 months for laboratory work only and then in 6 months for laboratory work and  further evaluation.   ?2.  Anemia: Mild, monitor.  Patient's hemoglobin is 11.0 today. ? ?I spent a total of 30 minutes reviewing chart data, face-to-face evaluation with the patient, counseling and coordination of care as det

## 2021-11-04 ENCOUNTER — Encounter: Payer: Self-pay | Admitting: Oncology

## 2021-11-04 ENCOUNTER — Inpatient Hospital Stay: Payer: PPO | Attending: Oncology

## 2021-11-04 ENCOUNTER — Inpatient Hospital Stay (HOSPITAL_BASED_OUTPATIENT_CLINIC_OR_DEPARTMENT_OTHER): Payer: PPO | Admitting: Oncology

## 2021-11-04 VITALS — BP 114/52 | HR 73 | Temp 97.7°F | Resp 16 | Ht 60.0 in | Wt 138.7 lb

## 2021-11-04 DIAGNOSIS — Z79899 Other long term (current) drug therapy: Secondary | ICD-10-CM | POA: Diagnosis not present

## 2021-11-04 DIAGNOSIS — C9110 Chronic lymphocytic leukemia of B-cell type not having achieved remission: Secondary | ICD-10-CM | POA: Diagnosis not present

## 2021-11-04 DIAGNOSIS — I10 Essential (primary) hypertension: Secondary | ICD-10-CM | POA: Insufficient documentation

## 2021-11-04 DIAGNOSIS — E785 Hyperlipidemia, unspecified: Secondary | ICD-10-CM | POA: Insufficient documentation

## 2021-11-04 DIAGNOSIS — C8590 Non-Hodgkin lymphoma, unspecified, unspecified site: Secondary | ICD-10-CM

## 2021-11-04 LAB — CBC WITH DIFFERENTIAL/PLATELET
Abs Immature Granulocytes: 0.07 10*3/uL (ref 0.00–0.07)
Basophils Absolute: 0 10*3/uL (ref 0.0–0.1)
Basophils Relative: 0 %
Eosinophils Absolute: 0.1 10*3/uL (ref 0.0–0.5)
Eosinophils Relative: 1 %
HCT: 35.3 % — ABNORMAL LOW (ref 36.0–46.0)
Hemoglobin: 11 g/dL — ABNORMAL LOW (ref 12.0–15.0)
Immature Granulocytes: 1 %
Lymphocytes Relative: 41 %
Lymphs Abs: 5.2 10*3/uL — ABNORMAL HIGH (ref 0.7–4.0)
MCH: 24.9 pg — ABNORMAL LOW (ref 26.0–34.0)
MCHC: 31.2 g/dL (ref 30.0–36.0)
MCV: 80 fL (ref 80.0–100.0)
Monocytes Absolute: 1.1 10*3/uL — ABNORMAL HIGH (ref 0.1–1.0)
Monocytes Relative: 9 %
Neutro Abs: 6.1 10*3/uL (ref 1.7–7.7)
Neutrophils Relative %: 48 %
Platelets: 229 10*3/uL (ref 150–400)
RBC: 4.41 MIL/uL (ref 3.87–5.11)
RDW: 15.2 % (ref 11.5–15.5)
Smear Review: ADEQUATE
WBC: 12.6 10*3/uL — ABNORMAL HIGH (ref 4.0–10.5)
nRBC: 0 % (ref 0.0–0.2)

## 2021-11-04 LAB — COMPREHENSIVE METABOLIC PANEL
ALT: 16 U/L (ref 0–44)
AST: 28 U/L (ref 15–41)
Albumin: 3.3 g/dL — ABNORMAL LOW (ref 3.5–5.0)
Alkaline Phosphatase: 87 U/L (ref 38–126)
Anion gap: 6 (ref 5–15)
BUN: 20 mg/dL (ref 8–23)
CO2: 25 mmol/L (ref 22–32)
Calcium: 8.2 mg/dL — ABNORMAL LOW (ref 8.9–10.3)
Chloride: 104 mmol/L (ref 98–111)
Creatinine, Ser: 0.92 mg/dL (ref 0.44–1.00)
GFR, Estimated: >60 mL/min (ref >60)
Glucose, Bld: 129 mg/dL — ABNORMAL HIGH (ref 70–99)
Potassium: 3.8 mmol/L (ref 3.5–5.1)
Sodium: 135 mmol/L (ref 135–145)
Total Bilirubin: 0.9 mg/dL (ref 0.3–1.2)
Total Protein: 6 g/dL — ABNORMAL LOW (ref 6.5–8.1)

## 2021-11-06 DIAGNOSIS — I10 Essential (primary) hypertension: Secondary | ICD-10-CM | POA: Diagnosis not present

## 2021-11-06 DIAGNOSIS — I700 Atherosclerosis of aorta: Secondary | ICD-10-CM | POA: Diagnosis not present

## 2021-11-06 DIAGNOSIS — R600 Localized edema: Secondary | ICD-10-CM | POA: Diagnosis not present

## 2021-11-06 DIAGNOSIS — E782 Mixed hyperlipidemia: Secondary | ICD-10-CM | POA: Diagnosis not present

## 2021-11-16 ENCOUNTER — Other Ambulatory Visit: Payer: Self-pay | Admitting: Family Medicine

## 2021-11-16 DIAGNOSIS — I10 Essential (primary) hypertension: Secondary | ICD-10-CM

## 2021-11-19 ENCOUNTER — Other Ambulatory Visit (HOSPITAL_COMMUNITY): Payer: Self-pay

## 2021-11-19 ENCOUNTER — Other Ambulatory Visit: Payer: Self-pay | Admitting: Oncology

## 2021-11-19 DIAGNOSIS — R7989 Other specified abnormal findings of blood chemistry: Secondary | ICD-10-CM | POA: Diagnosis not present

## 2021-11-19 DIAGNOSIS — R946 Abnormal results of thyroid function studies: Secondary | ICD-10-CM | POA: Diagnosis not present

## 2021-11-19 DIAGNOSIS — E538 Deficiency of other specified B group vitamins: Secondary | ICD-10-CM | POA: Diagnosis not present

## 2021-11-19 DIAGNOSIS — R410 Disorientation, unspecified: Secondary | ICD-10-CM | POA: Diagnosis not present

## 2021-11-19 DIAGNOSIS — C9110 Chronic lymphocytic leukemia of B-cell type not having achieved remission: Secondary | ICD-10-CM | POA: Diagnosis not present

## 2021-11-19 DIAGNOSIS — Z8639 Personal history of other endocrine, nutritional and metabolic disease: Secondary | ICD-10-CM | POA: Diagnosis not present

## 2021-11-19 DIAGNOSIS — R4189 Other symptoms and signs involving cognitive functions and awareness: Secondary | ICD-10-CM | POA: Diagnosis not present

## 2021-11-19 MED ORDER — IBRUTINIB 420 MG PO TABS
420.0000 mg | ORAL_TABLET | Freq: Every day | ORAL | 5 refills | Status: DC
  Filled 2021-11-26: qty 28, 28d supply, fill #0

## 2021-11-19 NOTE — Telephone Encounter (Signed)
CBC with Differential Order: [id number] Status: Edited Result - FINAL    Visible to patient: No (inaccessible in MyChart)    Next appt: 12/04/2021 at 10:20 AM in Family Medicine Lavon Paganini, MD)    Dx: CLL (chronic lymphocytic leukemia) (H...    0 Result Notes           Component Ref Range & Units 2 wk ago (11/04/21) 2 mo ago (09/02/21) 5 mo ago (05/29/21) 8 mo ago (02/27/21) 11 mo ago (11/28/20) 1 yr ago (08/27/20) 1 yr ago (07/02/20)  WBC 4.0 - 10.5 K/uL 12.6 High   13.2 High   12.5 High   13.6 High   13.4 High   16.0 High   20.8 High    RBC 3.87 - 5.11 MIL/uL 4.41  4.77  5.05  4.93  4.53  4.95  5.24 High    Hemoglobin 12.0 - 15.0 g/dL 11.0 Low   12.0  12.4  12.0  11.6 Low   12.5  13.0   HCT 36.0 - 46.0 % 35.3 Low   38.3  39.9  38.3  36.5  39.7  41.4   MCV 80.0 - 100.0 fL 80.0  80.3  79.0 Low   77.7 Low   80.6  80.2  79.0 Low    MCH 26.0 - 34.0 pg 24.9 Low   25.2 Low   24.6 Low   24.3 Low   25.6 Low   25.3 Low   24.8 Low    MCHC 30.0 - 36.0 g/dL 31.2  31.3  31.1  31.3  31.8  31.5  31.4   RDW 11.5 - 15.5 % 15.2  16.4 High   15.9 High   16.3 High   14.8  16.0 High   15.3   Platelets 150 - 400 K/uL 229  268  239  278  263  375  314   nRBC 0.0 - 0.2 % 0.0  0.0  0.0  0.0  0.0  0.0  0.0   Neutrophils Relative % % 48  45  51  39  35  45  33   Neutro Abs 1.7 - 7.7 K/uL 6.1  5.9  6.4  5.3  4.7  7.2  6.9   Lymphocytes Relative % 41  44  40  52  61  46  60   Lymphs Abs 0.7 - 4.0 K/uL 5.2 High   6.1 High   5.0 High   7.1 High   8.2 High   7.4 High   12.4 High    Monocytes Relative % '9  8  7  7  4  8  6   '$ Monocytes Absolute 0.1 - 1.0 K/uL 1.1 High   1.1 High   0.9  1.0  0.5  1.3 High   1.1 High    Eosinophils Relative % '1  1  1  1  '$ 0  0  0   Eosinophils Absolute 0.0 - 0.5 K/uL 0.1  0.1  0.1  0.2  0.0  0.0  0.1   Basophils Relative % 0  1  0  1  0  0  0   Basophils Absolute 0.0 - 0.1 K/uL 0.0  0.1  0.0  0.1  0.0  0.0  0.1   WBC Morphology  Diff confirmed by manual VC  DIFF CONFIRMED BY MANUAL   DIFF  CONFIRMED BY MANUAL  DIFF CONFIRMED, CONSISTANT WITH KNOWN CLL  DIFF CONFIRMED  DIFF CONFIRMED BY MANUAL. OCC SMUDGE CELLS  AND FEW ABNORMAL LYMPHOCYTES CONSISTANT WITH KNOWN CLL.   RBC Morphology  MORPHOLOGY UNREMARKABLE VC  UNREMARKABLE   UNREMARKABLE  UNREMARKABLE  UNREMARKABLE  UNREMARKABLE   Smear Review  PLATELETS APPEAR ADEQUATE VC  Normal platelet morphology CM   Normal platelet morphology CM  PLATELETS APPEAR ADEQUATE CM  Normal platelet morphology CM  PLATELETS APPEAR ADEQUATE CM   Comment: Platelets vary in size  Immature Granulocytes % '1  1  1  '$ 0    1   Abs Immature Granulocytes 0.00 - 0.07 K/uL 0.07  0.08 High  CM  0.06 CM  0.05 CM  0.00 CM  0.20 High  CM  0.13 High  CM   Comment: Performed at [facility], [address] [place], [place] [address]  Myelocytes       1 R    Resulting Agency  [place] [facility] [place] [facility] [place] [facility] [place] [facility] [place] [facility] [place] [facility]         Specimen Collected: 11/04/21 13:41 Last Resulted: 11/04/21 14:27      Lab Flowsheet    Order Details    View Encounter    Lab and Collection Details    Routing    Result History    View Encounter Conversation      VC=Value has a corrected status   CM=Additional comments  R=Reference range differs from displayed range      Result Care Coordination   Patient Communication   Add Comments   Not seen Back to Top       Other Results from 11/04/2021   Contains abnormal data Comprehensive metabolic panel Order: [id number] Status: Final result    Visible to patient: No (inaccessible in [place])    Next appt: 12/04/2021 at 10:20 AM in Family Medicine Lavon Paganini, MD)    Dx: CLL (chronic lymphocytic leukemia) (H...    0 Result Notes           Component Ref Range & Units 2 wk ago (11/04/21) 2 mo ago (09/02/21) 5 mo ago (05/29/21) 8 mo ago (02/27/21) 11 mo ago (11/28/20) 11 mo ago (11/26/20) 1 yr ago (08/27/20)  Sodium 135 - 145 mmol/L 135  139  139  140  139   140   Potassium 3.5 - 5.1  mmol/L 3.8  3.1 Low   4.0  4.3  3.4 Low    3.5   Chloride 98 - 111 mmol/L 104  102  100  105  101   98   CO2 22 - 32 mmol/L '25  29  29  26  28   28   '$ Glucose, Bld 70 - 99 mg/dL 129 High   120 High  CM  93 CM  87 CM  116 High  CM   144 High  CM   Comment: Glucose reference range applies only to samples taken after fasting for at least 8 hours.  BUN 8 - 23 mg/dL '20  19  14  22  15   23   '$ Creatinine, Ser 0.44 - 1.00 mg/dL 0.92  0.79  0.84  1.12 High   0.77  1.00  0.98   Calcium 8.9 - 10.3 mg/dL 8.2 Low   8.4 Low   8.7 Low   8.8 Low   8.8 Low    8.5 Low    Total Protein 6.5 - 8.1 g/dL 6.0 Low   6.1 Low   6.6  6.9  6.5  6.5   Albumin 3.5 - 5.0 g/dL 3.3 Low   3.5  3.8  3.9  3.7   3.6   AST 15 - 41 U/L '28  28  26  24  27   31   '$ ALT 0 - 44 U/L '16  16  15  16  18   17   '$ Alkaline Phosphatase 38 - 126 U/L 87  95  94  95  87   99   Total Bilirubin 0.3 - 1.2 mg/dL 0.9  1.0  0.6  0.5  0.6   0.8   GFR, Estimated >60 mL/min >60  >60 CM  >60 CM  48 Low  CM  >60 CM   57 Low  CM   Comment: (NOTE)  Calculated using the CKD-EPI Creatinine Equation (2021)   Anion gap 5 - '15 6  8 '$ CM  10 CM  9 CM  10 CM   14 CM   Comment: Performed at [facility], [address] [place], [place] [address]  Resulting Agency  [facility] [place] [facility] [place] [facility] [place] [facility] [place] [facility] [place] [facility] [place] [facility]         Specimen Collected: 11/04/21 13:41 Last Resulted: 11/04/21 14:06

## 2021-11-26 ENCOUNTER — Other Ambulatory Visit (HOSPITAL_COMMUNITY): Payer: Self-pay

## 2021-12-02 ENCOUNTER — Other Ambulatory Visit (HOSPITAL_COMMUNITY): Payer: Self-pay

## 2021-12-02 NOTE — Progress Notes (Signed)
I,Sulibeya S Dimas,acting as a Education administrator for Lavon Paganini, MD.,have documented all relevant documentation on the behalf of Lavon Paganini, MD,as directed by  Lavon Paganini, MD while in the presence of Lavon Paganini, MD.   Complete physical exam   Patient: Barbara Chambers   DOB: 1934/08/31   87 y.o. Female  MRN: [id number] Visit Date: 12/04/2021  Today's healthcare provider: Lavon Paganini, MD   Chief Complaint  Patient presents with   Annual Exam   Subjective    Barbara Chambers is a 87 y.o. female who presents today for a complete physical exam.  She reports consuming a general diet. Home exercise routine includes walking and low impact aerobic. She generally feels well. She reports sleeping well. She does not have additional problems to discuss today.  HPI  10/08/21 AWV  Past Medical History:  Diagnosis Date   Chronic lymphocytic leukemia ([place]) 2011   Hyperlipidemia    Hypertension    Past Surgical History:  Procedure Laterality Date   cataract surgery Bilateral 2005   CHOLECYSTECTOMY  1991   RECONSTRUCTION OF EYELID     Social History   Socioeconomic History   Marital status: Widowed    Spouse name: Casandra Doffing   Number of children: 3   Years of education: Not on file   Highest education level: 12th grade  Occupational History   Occupation: retired  Tobacco Use   Smoking status: Never   Smokeless tobacco: Never  Vaping Use   Vaping Use: Never used  Substance and Sexual Activity   Alcohol use: No   Drug use: No   Sexual activity: Not on file  Other Topics Concern   Not on file  Social History Narrative   Pt has 1 son that was killed in a car accident at age 29.    Son [name] is primary caregiver   Social Determinants of Health   Financial Resource Strain: Low Risk  (10/08/2021)   Overall Financial Resource Strain (CARDIA)    Difficulty of Paying Living Expenses: Not hard at all  Food Insecurity: No Food Insecurity (10/08/2021)   Hunger Vital  Sign    Worried About Running Out of Food in the Last Year: Never true    Ran Out of Food in the Last Year: Never true  Transportation Needs: No Transportation Needs (10/08/2021)   PRAPARE - Hydrologist (Medical): No    Lack of Transportation (Non-Medical): No  Physical Activity: Sufficiently Active (10/08/2021)   Exercise Vital Sign    Days of Exercise per Week: 5 days    Minutes of Exercise per Session: 30 min  Stress: No Stress Concern Present (10/08/2021)   [place]    Feeling of Stress : Not at all  Social Connections: Moderately Isolated (10/08/2021)   Social Connection and Isolation Panel [NHANES]    Frequency of Communication with Friends and Family: More than three times a week    Frequency of Social Gatherings with Friends and Family: Once a week    Attends Religious Services: More than 4 times per year    Active Member of [employer] or Organizations: No    Attends Archivist Meetings: Never    Marital Status: Widowed  Intimate Partner Violence: Not At Risk (10/08/2021)   Humiliation, Afraid, Rape, and Kick questionnaire    Fear of Current or Ex-Partner: No    Emotionally Abused: No    Physically Abused: No  Sexually Abused: No   Family Status  Relation Name Status   Mother  Deceased at age 31   Father  Deceased   Brother  Alive   Son  Alive   Son  Deceased       killed in a car accident   Son  Alive   Neg Hx  (Not Specified)   Family History  Problem Relation Age of Onset   Hypertension Mother    Stroke Mother    CAD Mother    Hypertension Father    Heart attack Father    Hypertension Brother    Seizures Brother    Hypertension Son    Breast cancer Neg Hx    Allergies  Allergen Reactions   Hctz [Hydrochlorothiazide]     Hyponatremia   Penicillins Rash    Patient Care Team: Joelly Bolanos, [name], MD as PCP - General (Family Medicine) Lloyd Huger, MD as Consulting Physician (Oncology) Corey Skains, MD as Consulting Physician (Cardiology) Birder Robson, MD as Referring Physician (Ophthalmology) Neldon Labella, RN as Case Manager Germaine Pomfret, [facility] (Pharmacist)   Medications: Outpatient Medications Prior to Visit  Medication Sig   atorvastatin (LIPITOR) 40 MG tablet Take 40 mg by mouth daily.   COENZYME Q-10 PO Take 200 mg by mouth daily.    DULoxetine (CYMBALTA) 20 MG capsule TAKE 1 CAPSULE BY MOUTH EVERY DAY   furosemide (LASIX) 20 MG tablet Take 1 tablet by mouth daily.   ibrutinib (IMBRUVICA) 420 MG tablet TAKE 1 TABLET BY MOUTH DAILY.   metoprolol succinate (TOPROL-XL) 100 MG 24 hr tablet TAKE 1 TABLET BY MOUTH EVERY DAY WITH OR IMMEDIATELY FOLLOWING A MEAL   OLANZapine (ZYPREXA) 2.5 MG tablet TAKE 1 TABLET BY MOUTH EVERYDAY AT BEDTIME   rivastigmine (EXELON) 1.5 MG capsule Take 1.5 mg by mouth 2 (two) times daily.   SODIUM FLUORIDE 5000 PPM 1.1 % PSTE See admin instructions.   telmisartan (MICARDIS) 40 MG tablet Take 2 tablets (80 mg total) by mouth daily.   vitamin B-12 (CYANOCOBALAMIN) 1000 MCG tablet Take 1,000 mcg by mouth daily.   furosemide (LASIX) 20 MG tablet Take 20 mg by mouth daily.   No facility-administered medications prior to visit.    Review of Systems  All other systems reviewed and are negative.   Last CBC Lab Results  Component Value Date   WBC 12.6 (H) 11/04/2021   HGB 11.0 (L) 11/04/2021   HCT 35.3 (L) 11/04/2021   MCV 80.0 11/04/2021   MCH 24.9 (L) 11/04/2021   RDW 15.2 11/04/2021   PLT 229 41/96/2229   Last metabolic panel Lab Results  Component Value Date   GLUCOSE 129 (H) 11/04/2021   NA 135 11/04/2021   K 3.8 11/04/2021   CL 104 11/04/2021   CO2 25 11/04/2021   BUN 20 11/04/2021   CREATININE 0.92 11/04/2021   GFRNONAA >60 11/04/2021   CALCIUM 8.2 (L) 11/04/2021   PHOS 3.3 03/17/2019   PROT 6.0 (L) 11/04/2021   ALBUMIN 3.3 (L) 11/04/2021   LABGLOB 2.1  09/28/2019   AGRATIO 2.0 09/28/2019   BILITOT 0.9 11/04/2021   ALKPHOS 87 11/04/2021   AST 28 11/04/2021   ALT 16 11/04/2021   ANIONGAP 6 11/04/2021   Last lipids Lab Results  Component Value Date   CHOL 134 06/10/2021   HDL 63 06/10/2021   LDLCALC 58 06/10/2021   TRIG 60 06/10/2021   CHOLHDL 2.1 06/10/2021   Last thyroid functions Lab Results  Component Value Date   TSH 11.300 (H) 06/10/2021   T4TOTAL 6.9 08/27/2016      Objective     BP 136/60 (BP Location: Left Arm, Patient Position: Sitting, Cuff Size: Normal)   Pulse 61   Temp 98.2 F (36.8 C) (Oral)   Resp 16   Ht '4\' 10"'$  (1.473 m)   Wt 137 lb 12.8 oz (62.5 kg)   SpO2 98%   BMI 28.80 kg/m  BP Readings from Last 3 Encounters:  12/04/21 136/60  11/04/21 (!) 114/52  10/08/21 130/60   Wt Readings from Last 3 Encounters:  12/04/21 137 lb 12.8 oz (62.5 kg)  11/04/21 138 lb 11.2 oz (62.9 kg)  10/08/21 136 lb 12.8 oz (62.1 kg)       Physical Exam Vitals reviewed.  Constitutional:      General: She is not in acute distress.    Appearance: Normal appearance. She is well-developed. She is not diaphoretic.  HENT:     Head: Normocephalic and atraumatic.     Right Ear: Tympanic membrane, ear canal and external ear normal.     Left Ear: Tympanic membrane, ear canal and external ear normal.     Nose: Nose normal.     Mouth/Throat:     Mouth: Mucous membranes are moist.     Pharynx: Oropharynx is clear. No oropharyngeal exudate.  Eyes:     General: No scleral icterus.    Conjunctiva/sclera: Conjunctivae normal.     Pupils: Pupils are equal, round, and reactive to light.  Neck:     Thyroid: No thyromegaly.  Cardiovascular:     Rate and Rhythm: Normal rate and regular rhythm.     Heart sounds: Normal heart sounds. No murmur heard. Pulmonary:     Effort: Pulmonary effort is normal. No respiratory distress.     Breath sounds: Normal breath sounds. No wheezing or rales.  Abdominal:     General: There is no  distension.     Palpations: Abdomen is soft.     Tenderness: There is no abdominal tenderness.  Musculoskeletal:        General: No deformity.     Cervical back: Neck supple.     Right lower leg: No edema.     Left lower leg: No edema.  Lymphadenopathy:     Cervical: No cervical adenopathy.  Skin:    General: Skin is warm and dry.  Neurological:     Mental Status: She is alert. Mental status is at baseline.     Gait: Gait normal.  Psychiatric:        Mood and Affect: Mood normal.        Behavior: Behavior normal.       Last depression screening scores    12/04/2021   10:24 AM 10/08/2021    9:04 AM 04/25/2021   11:26 AM  PHQ 2/9 Scores  PHQ - 2 Score 0 0 0  PHQ- 9 Score 0  0   Last fall risk screening    10/08/2021    9:06 AM  [place] in the past year? 0  Number falls in past yr: 0  Injury with Fall? 0  Risk for fall due to : No Fall Risks  Follow up Falls evaluation completed   Last Audit-C alcohol use screening    10/08/2021    9:03 AM  Alcohol Use Disorder Test (AUDIT)  1. How often do you have a drink containing alcohol? 0  2. How many drinks  containing alcohol do you have on a typical day when you are drinking? 0  3. How often do you have six or more drinks on one occasion? 0  AUDIT-C Score 0   A score of 3 or more in women, and 4 or more in men indicates increased risk for alcohol abuse, EXCEPT if all of the points are from question 1   No results found for any visits on 12/04/21.  Assessment & Plan    Routine Health Maintenance and Physical Exam  Exercise Activities and Dietary recommendations  Goals       "I need to make sure mom follows up with a psychiatrist" (pt-stated)      Current Barriers:  Mental Health Concerns   Clinical Social Work Clinical Goal(s):  Over the next 30 days, client will follow up with [facility] as directed by patient's provider to address continued delusion that bed bugs remain in her  home  Interventions: [name] who is on DPR  interviewed and appropriate assessments performed Followed up on  referral to [facility] to address delusional disorder Confirmed that patient appointment scheduled for 03/28/19 with [name] ANP-C for medication assessment was kept Patient's EMR confirmed that patient does not have a UTI and she can now stop the antibiotics Encouraged patient's son to continue to support patient with ongoing mental health follow up with [facility] for medication management. [name] provided  with direct contact information for this social worker in the event that any community resource needs arise   Patient Self Care Activities:  Attends all scheduled provider appointments Performs ADL's independently Unable to perform IADLs independently  Please see past updates related to this goal by clicking on the "Past Updates" button in the selected goal        DIET - EAT MORE FRUITS AND VEGETABLES      Exercise      Starting 06/24/16, I will start back walking 4 days a week for 45 minutes.      LIFESTYLE - DECREASE FALLS RISK      Recommend to drink at least 6-8 8oz glasses of water per day.       Prevent falls      Recommend to remove any items from the home that may cause slips or trips.      Track and Manage My Blood Pressure-Hypertension      Timeframe:  Long-Range Goal Priority:  High Start Date: 09/23/21                             Expected End Date: 09/24/22                      Follow Up within 90 days   - check blood pressure 3 times per week    Why is this important?   You won't feel high blood pressure, but it can still hurt your blood vessels.  High blood pressure can cause heart or kidney problems. It can also cause a stroke.  Making lifestyle changes like losing a little weight or eating less salt will help.  Checking your blood pressure at home and at different times of the day can help to control  blood pressure.  If the doctor prescribes medicine remember to take it the way the doctor ordered.  Call the office if you cannot afford the medicine or if there are questions about it.  Notes:         Immunization History  Administered Date(s) Administered   Fluad Quad(high Dose 65+) 03/17/2019, 03/07/2020, 04/25/2021   Influenza, High Dose Seasonal PF 06/24/2016, 04/20/2017, 05/04/2018   PFIZER Comirnaty(Gray Top)Covid-19 Tri-Sucrose Vaccine 11/19/2020   PFIZER(Purple Top)SARS-COV-2 Vaccination 09/02/2019, 03/07/2020   Pneumococcal Conjugate-13 03/30/2014   Pneumococcal Polysaccharide-23 04/20/2017   Td 04/05/2017   Tdap 07/14/2005    Health Maintenance  Topic Date Due   Zoster Vaccines- Shingrix (1 of 2) Never done   COVID-19 Vaccine (4 - Booster for Pfizer series) 01/14/2021   INFLUENZA VACCINE  01/27/2022   TETANUS/TDAP  04/06/2027   Pneumonia Vaccine 52+ Years old  Completed   DEXA SCAN  Completed   HPV VACCINES  Aged Out    Discussed health benefits of physical activity, and encouraged her to engage in regular exercise appropriate for her age and condition.  Problem List Items Addressed This Visit       Cardiovascular and Mediastinum   Benign essential HTN    Well controlled Continue current medications Reviewed recent metabolic panel F/u in 6 months       Senile purpura ([place])    Chronic and stable Continue to monitor      Atherosclerosis of abdominal aorta (HCC)    Continue statin therapy Also followed by cardiology        Endocrine   Subclinical hypothyroidism    Asymptomatic Has previously had normal free T4 Her most recent TSH was slightly elevated, but T4 was not checked at that time We will recheck TSH and free T4 to determine need for medications      Relevant Orders   TSH + free T4     Other   HLD (hyperlipidemia)    Well-controlled on last lipid panel Continue statin Continue to monitor annual FLP      Other Visit  Diagnoses     Encounter for annual physical exam    -  Primary   Relevant Orders   TSH + free T4   Hemoglobin A1c   Hyperglycemia       Relevant Orders   Hemoglobin A1c        Return in about 6 months (around 06/05/2022) for chronic disease f/u.     I, Lavon Paganini, MD, have reviewed all documentation for this visit. The documentation on 12/04/21 for the exam, diagnosis, procedures, and orders are all accurate and complete.   Davison Ohms, [name], MD, MPH [facility]

## 2021-12-03 ENCOUNTER — Other Ambulatory Visit (HOSPITAL_COMMUNITY): Payer: Self-pay

## 2021-12-04 ENCOUNTER — Ambulatory Visit (INDEPENDENT_AMBULATORY_CARE_PROVIDER_SITE_OTHER): Payer: PPO | Admitting: Family Medicine

## 2021-12-04 ENCOUNTER — Encounter: Payer: Self-pay | Admitting: Family Medicine

## 2021-12-04 VITALS — BP 136/60 | HR 61 | Temp 98.2°F | Resp 16 | Ht <= 58 in | Wt 137.8 lb

## 2021-12-04 DIAGNOSIS — E038 Other specified hypothyroidism: Secondary | ICD-10-CM | POA: Diagnosis not present

## 2021-12-04 DIAGNOSIS — I10 Essential (primary) hypertension: Secondary | ICD-10-CM | POA: Diagnosis not present

## 2021-12-04 DIAGNOSIS — Z0000 Encounter for general adult medical examination without abnormal findings: Secondary | ICD-10-CM | POA: Diagnosis not present

## 2021-12-04 DIAGNOSIS — I700 Atherosclerosis of aorta: Secondary | ICD-10-CM | POA: Diagnosis not present

## 2021-12-04 DIAGNOSIS — R739 Hyperglycemia, unspecified: Secondary | ICD-10-CM | POA: Diagnosis not present

## 2021-12-04 DIAGNOSIS — E782 Mixed hyperlipidemia: Secondary | ICD-10-CM | POA: Diagnosis not present

## 2021-12-04 DIAGNOSIS — D692 Other nonthrombocytopenic purpura: Secondary | ICD-10-CM | POA: Diagnosis not present

## 2021-12-04 NOTE — Assessment & Plan Note (Signed)
Asymptomatic Has previously had normal free T4 Her most recent TSH was slightly elevated, but T4 was not checked at that time We will recheck TSH and free T4 to determine need for medications

## 2021-12-04 NOTE — Patient Instructions (Signed)
Consider asking at the pharmacy about the shingles shot (Shingrix)

## 2021-12-04 NOTE — Assessment & Plan Note (Signed)
Chronic and stable Continue to monitor 

## 2021-12-04 NOTE — Assessment & Plan Note (Signed)
Continue statin therapy Also followed by cardiology

## 2021-12-04 NOTE — Assessment & Plan Note (Signed)
Well controlled Continue current medications Reviewed recent metabolic panel F/u in 6 months  

## 2021-12-04 NOTE — Assessment & Plan Note (Signed)
Well-controlled on last lipid panel Continue statin Continue to monitor annual FLP

## 2021-12-05 LAB — TSH+FREE T4
Free T4: 1.23 ng/dL (ref 0.82–1.77)
TSH: 8.38 u[IU]/mL — ABNORMAL HIGH (ref 0.450–4.500)

## 2021-12-05 LAB — HEMOGLOBIN A1C
Est. average glucose Bld gHb Est-mCnc: 134 mg/dL
Hgb A1c MFr Bld: 6.3 % — ABNORMAL HIGH (ref 4.8–5.6)

## 2021-12-08 ENCOUNTER — Other Ambulatory Visit (HOSPITAL_COMMUNITY): Payer: Self-pay

## 2021-12-09 ENCOUNTER — Telehealth: Payer: Self-pay | Admitting: Pharmacy Technician

## 2021-12-09 NOTE — Telephone Encounter (Signed)
Oral Oncology Patient Advocate Encounter  Coordinated with patients son, [name], by email to complete application for Wynetta Emery and [employer] ([employer]) in an effort to reduce patient's out of pocket expense for Imbruvica to $0.    Application completed and faxed to [phone].   [employer] phone number for follow up is [phone].   This encounter will be updated until final determination.   [place] Patient Barbara Chambers Phone [phone] Fax [phone] 12/09/2021 2:10 PM

## 2021-12-10 NOTE — Telephone Encounter (Signed)
Oral Oncology Patient Advocate Encounter  Received notification from [employer] that patient has been temporarily enrolled into their program to receive Imbruvica from the manufacturer at $0 out of pocket until 02/07/22.  Patient will need to sign Medicare D Attestation to be completely enrolled into the program until 06/28/22.   Specialty Pharmacy that will dispense medication is [facility].  Patient knows to call the office with questions or concerns.   Oral Oncology Clinic will continue to follow.  [place] Patient [place] Phone [phone] Fax [phone] 12/10/2021 3:52 PM

## 2021-12-13 ENCOUNTER — Other Ambulatory Visit: Payer: Self-pay | Admitting: Family Medicine

## 2021-12-15 NOTE — Telephone Encounter (Signed)
Requested Prescriptions  Pending Prescriptions Disp Refills  . DULoxetine (CYMBALTA) 20 MG capsule [Pharmacy Med Name: DULOXETINE HCL DR 20 MG CAP] 90 capsule 0    Sig: TAKE 1 CAPSULE BY MOUTH EVERY DAY     Psychiatry: Antidepressants - SNRI - duloxetine Passed - 12/13/2021  2:21 AM      Passed - Cr in normal range and within 360 days    Creat  Date Value Ref Range Status  04/20/2017 0.78 0.60 - 0.88 mg/dL Final    Comment:    For patients >85 years of age, the reference limit for Creatinine is approximately 13% higher for people identified as African-American. .    Creatinine, Ser  Date Value Ref Range Status  11/04/2021 0.92 0.44 - 1.00 mg/dL Final         Passed - eGFR is 30 or above and within 360 days    GFR, Est African American  Date Value Ref Range Status  04/20/2017 83 > OR = 60 mL/min/1.67m Final   GFR calc Af Amer  Date Value Ref Range Status  03/07/2020 >60 >60 mL/min Final   GFR, Est Non African American  Date Value Ref Range Status  04/20/2017 72 > OR = 60 mL/min/1.753mFinal   GFR, Estimated  Date Value Ref Range Status  11/04/2021 >60 >60 mL/min Final    Comment:    (NOTE) Calculated using the CKD-EPI Creatinine Equation (2021)          Passed - Completed PHQ-2 or PHQ-9 in the last 360 days      Passed - Last BP in normal range    BP Readings from Last 1 Encounters:  12/04/21 136/60         Passed - Valid encounter within last 6 months    Recent Outpatient Visits          1 week ago [facility] for annual physical exam   [facility]   6 months ago Subclinical hypothyroidism   [facility]   7 months ago Benign essential HTN   [facility]   1 year ago Encounter for annual physical exam   [facility]   1 year ago Benign essential HTN   [facility]

## 2021-12-15 NOTE — Telephone Encounter (Signed)
Faxed Medicare D attestation to [employer].  [place] Patient [place] Phone [phone] Fax [phone] 12/15/2021 1:11 PM

## 2021-12-18 ENCOUNTER — Other Ambulatory Visit (HOSPITAL_COMMUNITY): Payer: Self-pay

## 2021-12-22 ENCOUNTER — Telehealth: Payer: PPO

## 2022-01-22 ENCOUNTER — Telehealth: Payer: PPO

## 2022-02-03 ENCOUNTER — Ambulatory Visit: Payer: Self-pay

## 2022-02-03 DIAGNOSIS — E782 Mixed hyperlipidemia: Secondary | ICD-10-CM

## 2022-02-03 NOTE — Chronic Care Management (AMB) (Signed)
   02/03/2022  Barbara Chambers 1935/03/09 [id number]   Documentation encounter created to complete case transition. The care management team will continue to follow for care coordination.  [place] [facility] [phone]

## 2022-02-04 ENCOUNTER — Inpatient Hospital Stay: Payer: PPO | Attending: Oncology

## 2022-02-04 DIAGNOSIS — C9110 Chronic lymphocytic leukemia of B-cell type not having achieved remission: Secondary | ICD-10-CM | POA: Insufficient documentation

## 2022-02-04 DIAGNOSIS — C8590 Non-Hodgkin lymphoma, unspecified, unspecified site: Secondary | ICD-10-CM

## 2022-02-04 LAB — COMPREHENSIVE METABOLIC PANEL
ALT: 12 U/L (ref 0–44)
AST: 23 U/L (ref 15–41)
Albumin: 3.3 g/dL — ABNORMAL LOW (ref 3.5–5.0)
Alkaline Phosphatase: 78 U/L (ref 38–126)
Anion gap: 8 (ref 5–15)
BUN: 19 mg/dL (ref 8–23)
CO2: 28 mmol/L (ref 22–32)
Calcium: 8.4 mg/dL — ABNORMAL LOW (ref 8.9–10.3)
Chloride: 100 mmol/L (ref 98–111)
Creatinine, Ser: 0.82 mg/dL (ref 0.44–1.00)
GFR, Estimated: >60 mL/min (ref >60)
Glucose, Bld: 94 mg/dL (ref 70–99)
Potassium: 4.1 mmol/L (ref 3.5–5.1)
Sodium: 136 mmol/L (ref 135–145)
Total Bilirubin: 0.6 mg/dL (ref 0.3–1.2)
Total Protein: 5.9 g/dL — ABNORMAL LOW (ref 6.5–8.1)

## 2022-02-04 LAB — CBC WITH DIFFERENTIAL/PLATELET
Abs Immature Granulocytes: 0.06 10*3/uL (ref 0.00–0.07)
Basophils Absolute: 0.1 10*3/uL (ref 0.0–0.1)
Basophils Relative: 1 %
Eosinophils Absolute: 0.1 10*3/uL (ref 0.0–0.5)
Eosinophils Relative: 1 %
HCT: 36.5 % (ref 36.0–46.0)
Hemoglobin: 11.3 g/dL — ABNORMAL LOW (ref 12.0–15.0)
Immature Granulocytes: 0 %
Lymphocytes Relative: 49 %
Lymphs Abs: 6.8 10*3/uL — ABNORMAL HIGH (ref 0.7–4.0)
MCH: 24.1 pg — ABNORMAL LOW (ref 26.0–34.0)
MCHC: 31 g/dL (ref 30.0–36.0)
MCV: 78 fL — ABNORMAL LOW (ref 80.0–100.0)
Monocytes Absolute: 1 10*3/uL (ref 0.1–1.0)
Monocytes Relative: 8 %
Neutro Abs: 5.7 10*3/uL (ref 1.7–7.7)
Neutrophils Relative %: 41 %
Platelets: 228 10*3/uL (ref 150–400)
RBC: 4.68 MIL/uL (ref 3.87–5.11)
RDW: 15.7 % — ABNORMAL HIGH (ref 11.5–15.5)
Smear Review: NORMAL
WBC: 13.8 10*3/uL — ABNORMAL HIGH (ref 4.0–10.5)
nRBC: 0 % (ref 0.0–0.2)

## 2022-02-26 ENCOUNTER — Other Ambulatory Visit (HOSPITAL_COMMUNITY): Payer: Self-pay

## 2022-03-24 ENCOUNTER — Ambulatory Visit (INDEPENDENT_AMBULATORY_CARE_PROVIDER_SITE_OTHER): Payer: PPO

## 2022-03-24 DIAGNOSIS — E782 Mixed hyperlipidemia: Secondary | ICD-10-CM

## 2022-03-24 DIAGNOSIS — I10 Essential (primary) hypertension: Secondary | ICD-10-CM

## 2022-03-24 NOTE — Progress Notes (Signed)
Chronic Care Management Pharmacy Note  03/24/2022 Name:  Barbara Chambers MRN:  [id number] DOB:  1935-02-13  Summary: Patient presents for CCM follow-up. Today's visit was 50% with the patient and 50% conducted by the patient's son and caregiver, [name].   Recommendations/Changes made from today's visit: Continue current medications  Plan: No further follow up required: Patient will reach out to clinical team for further questions.  Subjective: Barbara Chambers is an 87 y.o. year old female who is a primary patient of [name], [name], MD.  The CCM team was consulted for assistance with disease management and care coordination needs.    Engaged with patient by telephone for follow up visit in response to provider referral for pharmacy case management and/or care coordination services.   Consent to Services:  The patient was given information about Chronic Care Management services, agreed to services, and gave verbal consent prior to initiation of services.  Please see initial visit note for detailed documentation.   Patient Care Team: [name], MD as PCP - General (Family Medicine) Lloyd Huger, MD as Consulting Physician (Oncology) Corey Skains, MD as Consulting Physician (Cardiology) Birder Robson, MD as Referring Physician (Ophthalmology) Neldon Labella, RN as Case Manager Germaine Pomfret, [facility] (Pharmacist)  Recent office visits: 12/04/21: Lavon Paganini, MD (PCP Office Visit) for Follow-up- No medication changes noted, lab order placed, Patient to follow-up in 6 months  Recent consult visits: 11/19/21: Patient presented to Dr. Manuella Ghazi (Neurology) for follow-up. 11/06/21: Patient presented to Dr. Nehemiah Massed (Cardiology) for follow-up. 11/04/21: Patient presented to Dr. [name] (Oncology) for follow-up.   Hospital visits: None in previous 6 months   Objective:  Lab Results  Component Value Date   CREATININE 0.82 02/04/2022   BUN 19 02/04/2022    GFRNONAA >60 02/04/2022   GFRAA >60 03/07/2020   NA 136 02/04/2022   K 4.1 02/04/2022   CALCIUM 8.4 (L) 02/04/2022   CO2 28 02/04/2022   GLUCOSE 94 02/04/2022    Lab Results  Component Value Date/Time   HGBA1C 6.3 (H) 12/04/2021 10:44 AM    Last diabetic Eye exam: No results found for: "HMDIABEYEEXA"  Last diabetic Foot exam: No results found for: "HMDIABFOOTEX"   Lab Results  Component Value Date   CHOL 134 06/10/2021   HDL 63 06/10/2021   LDLCALC 58 06/10/2021   TRIG 60 06/10/2021   CHOLHDL 2.1 06/10/2021       Latest Ref Rng & Units 02/04/2022    9:24 AM 11/04/2021    1:41 PM 09/02/2021   10:25 AM  Hepatic Function  Total Protein 6.5 - 8.1 g/dL 5.9  6.0  6.1   Albumin 3.5 - 5.0 g/dL 3.3  3.3  3.5   AST 15 - 41 U/L '23  28  28   ' ALT 0 - 44 U/L '12  16  16   ' Alk Phosphatase 38 - 126 U/L 78  87  95   Total Bilirubin 0.3 - 1.2 mg/dL 0.6  0.9  1.0     Lab Results  Component Value Date/Time   TSH 8.380 (H) 12/04/2021 10:44 AM   TSH 11.300 (H) 06/10/2021 09:08 AM   FREET4 1.23 12/04/2021 10:44 AM   FREET4 1.07 06/10/2021 09:08 AM       Latest Ref Rng & Units 02/04/2022    9:24 AM 11/04/2021    1:41 PM 09/02/2021   10:25 AM  CBC  WBC 4.0 - 10.5 K/uL 13.8  12.6  13.2  Hemoglobin 12.0 - 15.0 g/dL 11.3  11.0  12.0   Hematocrit 36.0 - 46.0 % 36.5  35.3  38.3   Platelets 150 - 400 K/uL 228  229  268     No results found for: "VD25OH"  Clinical ASCVD: No  The ASCVD Risk score (Arnett DK, et al., 2019) failed to calculate for the following reasons:   The 2019 ASCVD risk score is only valid for ages 33 to 66       12/04/2021   10:24 AM 10/08/2021    9:04 AM 04/25/2021   11:26 AM  Depression screen PHQ 2/9  Decreased Interest 0 0 0  Down, Depressed, Hopeless 0 0 0  PHQ - 2 Score 0 0 0  Altered sleeping 0  0  Tired, decreased energy 0  0  Change in appetite 0  0  Feeling bad or failure about yourself  0  0  Trouble concentrating 0  0  Moving slowly or fidgety/restless  0  0  Suicidal thoughts 0  0  PHQ-9 Score 0  0  Difficult doing work/chores Not difficult at all       Social History   Tobacco Use  Smoking Status Never  Smokeless Tobacco Never   BP Readings from Last 3 Encounters:  12/04/21 136/60  11/04/21 (!) 114/52  10/08/21 130/60   Pulse Readings from Last 3 Encounters:  12/04/21 61  11/04/21 73  10/08/21 71   Wt Readings from Last 3 Encounters:  12/04/21 137 lb 12.8 oz (62.5 kg)  11/04/21 138 lb 11.2 oz (62.9 kg)  10/08/21 136 lb 12.8 oz (62.1 kg)   BMI Readings from Last 3 Encounters:  12/04/21 28.80 kg/m  11/04/21 27.09 kg/m  10/08/21 26.72 kg/m    Assessment/Interventions: Review of patient past medical history, allergies, medications, health status, including review of consultants reports, laboratory and other test data, was performed as part of comprehensive evaluation and provision of chronic care management services.   SDOH:  (Social Determinants of Health) assessments and interventions performed: Yes SDOH Interventions    Flowsheet Row Clinical Support from 10/08/2021 in [place] Management from 09/19/2021 in [place] Management from 02/07/2021 in [place] Visit from 05/27/2020 in [place] Interventions      Food Insecurity Interventions Intervention Not Indicated -- Intervention Not Indicated --  Housing Interventions Intervention Not Indicated -- -- --  Transportation Interventions Intervention Not Indicated Intervention Not Indicated Intervention Not Indicated --  Depression Interventions/Treatment  -- -- -- PHQ2-9 Score <4 Follow-up Not Indicated  Financial Strain Interventions Intervention Not Indicated Intervention Not Indicated -- --  Physical Activity Interventions Intervention Not Indicated -- -- --  Stress Interventions Intervention Not Indicated -- -- --  Social Connections Interventions Intervention  Not Indicated -- -- --      SDOH Screenings   Food Insecurity: No Food Insecurity (10/08/2021)  Housing: Low Risk  (10/08/2021)  Transportation Needs: No Transportation Needs (10/08/2021)  Alcohol Screen: Low Risk  (10/08/2021)  Depression (PHQ2-9): Low Risk  (12/04/2021)  Financial Resource Strain: Low Risk  (10/08/2021)  Physical Activity: Sufficiently Active (10/08/2021)  Social Connections: Moderately Isolated (10/08/2021)  Stress: No Stress Concern Present (10/08/2021)  Tobacco Use: Low Risk  (12/04/2021)    CCM Care Plan  Allergies  Allergen Reactions   Hctz [Hydrochlorothiazide]     Hyponatremia   Penicillins Rash    Medications Reviewed Today     Reviewed by  [name], MD (Physician) on 12/04/21 at 1038  Med List Status: <None>   Medication Order Taking? Sig Documenting Provider Last Dose Status Informant  atorvastatin (LIPITOR) 40 MG tablet [id number] Yes Take 40 mg by mouth daily. [provider] Taking Active   COENZYME Q-10 PO [id number] Yes Take 200 mg by mouth daily.  [provider] Taking Active Family Member           Med Note Jules Schick   Tue Dec 06, 2018  8:12 AM)    DULoxetine (CYMBALTA) 20 MG capsule [id number] Yes TAKE 1 CAPSULE BY MOUTH EVERY DAY [name], [name], MD Taking Active   furosemide (LASIX) 20 MG tablet [id number]  Take 20 mg by mouth daily. [provider]  Expired 11/04/21 2359   furosemide (LASIX) 20 MG tablet [id number] Yes Take 1 tablet by mouth daily. [provider] Taking Active   ibrutinib (IMBRUVICA) 420 MG tablet [id number] Yes TAKE 1 TABLET BY MOUTH DAILY. Lloyd Huger, MD Taking Active   metoprolol succinate (TOPROL-XL) 100 MG 24 hr tablet [id number] Yes TAKE 1 TABLET BY MOUTH EVERY DAY WITH OR IMMEDIATELY FOLLOWING A MEAL [name], [name], MD Taking Active   OLANZapine (ZYPREXA) 2.5 MG tablet [id number] Yes TAKE 1 TABLET BY MOUTH EVERYDAY AT BEDTIME [name], MD  Taking Active   rivastigmine (EXELON) 1.5 MG capsule [id number] Yes Take 1.5 mg by mouth 2 (two) times daily. [provider] Taking Active   SODIUM FLUORIDE 5000 PPM 1.1 % PSTE [id number] Yes See admin instructions. [provider] Taking Active   telmisartan (MICARDIS) 40 MG tablet [id number] Yes Take 2 tablets (80 mg total) by mouth daily. Gwyneth Sprout, FNP Taking Active   vitamin B-12 (CYANOCOBALAMIN) 1000 MCG tablet [id number] Yes Take 1,000 mcg by mouth daily. [provider] Taking Active   Med List Note Darl Pikes, RPH-CPP 11/22/19 1236): Imbruvica filled at [facility]            Patient Active Problem List   Diagnosis Date Noted   Bilateral leg edema 11/09/2019   Atherosclerosis of abdominal aorta ([place]) 11/09/2019   Senile purpura ([place]) 09/28/2019   Cervical adenopathy 09/28/2019   Memory loss 09/28/2019   Actinic keratosis 09/01/2019   Rosacea 09/01/2019   Adjustment disorder with mixed anxiety and depressed mood 03/01/2019   Obsessional thoughts 03/01/2019   Hallucinations 03/01/2019   Shortness of breath 07/11/2018   Vulvar irritation 07/11/2018   Dizziness 05/18/2017   Leg cramps 04/20/2017   Arthritis 03/08/2015   Anxiety 10/25/2014   CLL (chronic lymphocytic leukemia) ([place]) 10/25/2014   HLD (hyperlipidemia) 10/25/2014   Subclinical hypothyroidism 10/25/2014   Abnormal ECG 10/11/2014   Benign essential HTN 10/11/2014    Immunization History  Administered Date(s) Administered   Fluad Quad(high Dose 65+) 03/17/2019, 03/07/2020, 04/25/2021   Influenza, High Dose Seasonal PF 06/24/2016, 04/20/2017, 05/04/2018   PFIZER Comirnaty(Gray Top)Covid-19 Tri-Sucrose Vaccine 11/19/2020   PFIZER(Purple Top)SARS-COV-2 Vaccination 09/02/2019, 03/07/2020   Pneumococcal Conjugate-13 03/30/2014   Pneumococcal Polysaccharide-23 04/20/2017   Td 04/05/2017   Tdap 07/14/2005    Conditions to be addressed/monitored:  Hypertension,  Hyperlipidemia, Hypothyroidism, Anxiety, and Leukemia  Care Plan : General Pharmacy (Adult)  Updates made by Germaine Pomfret, RPH since 03/24/2022 12:00 AM     Problem: Hypertension, Hyperlipidemia, Hypothyroidism, Anxiety, and Leukemia   Priority: High     Long-Range Goal: Patient-Specific Goal   Start Date: 09/23/2021  Expected End Date: 09/24/2022  This Visit's Progress: On track  Recent Progress: On track  Priority: High  Note:   Current Barriers:  No barriers noted  Pharmacist Clinical Goal(s):  Patient will maintain control of blood pressure as evidenced by BP less than 140/90  through collaboration with PharmD and provider.   Interventions: 1:1 collaboration with [name], MD regarding development and update of comprehensive plan of care as evidenced by provider attestation and co-signature Inter-disciplinary care team collaboration (see longitudinal plan of care) Comprehensive medication review performed; medication list updated in electronic medical record  Hypertension (BP goal <140/90) -Controlled -Current treatment: Furosemide 20 mg daily: Appropriate, Effective, Safe, Accessible Metoprolol XL 100 mg daily: Appropriate, Effective, Safe, Accessible  Telmisartan 40 mg 2 tablets daily: Appropriate, Effective, Safe, Accessible  -Medications previously tried: NA  -Current home readings: 160 yesterday, normally 130s/140s.  -Current dietary habits: Limits processed foods, limits salt. Eats out multiple times weekly. Whole wheat  -Current exercise habits: Stays active walking within her house.  -Denies hypotensive/hypertensive symptoms -Recommended to continue current medication  Hyperlipidemia: (LDL goal < 70) -Controlled -Current treatment: Atorvastatin 40 mg daily: Appropriate, Effective, Safe, Accessible  -Medications previously tried: NA  -Recommended to continue current medication  Dementia/Anxiety (Goal: Maintain symptom  remission) -Controlled -managed by Dr. Manuella Ghazi, follow-up scheduled May 2023  -Current treatment: Olanzapine 2.5 mg nightly: Appropriate, Effective, Safe, Accessible  Duloxetine 20 mg daily: Appropriate, Effective, Safe, Accessible  Rivastigmine 1.5 mg daily: Appropriate, Effective, Safe, Accessible  -Medications previously tried/failed: NA -Recommended to continue current medication  Patient Goals/Self-Care Activities Patient will:  - check blood pressure weekly, document, and provide at future appointments  Follow Up Plan: No further follow up required: Patient will reach out to clinical team for further questions.    Medication Assistance: None required.  Patient affirms current coverage meets needs.  Compliance/Adherence/Medication fill history: Care Gaps: Zoster Vaccine COVID-19 Booster 4  Star-Rating Drugs: Atorvastatin 40 mg last filled on 07/14/2021 for a 90-Day supply with [facility] Telmisartan 40 mg last filled on 07/22/2021 for a 90-Day supply with [facility]  Patient's preferred pharmacy is:  [id number] [place] [place] [place]Phone: [phone]: [phone] [place] [place]Phone: [phone]: [address]Phone: [phone]: [phone]  [id number] [place] [place]one: [phone]: [phone] pill box? Yes, son fills  Pt endorses 100% compliance  We discussed: Current pharmacy is preferred with insurance plan and patient is satisfied with pharmacy services Patient decided to: Continue current medication management strategy  Care Plan and Follow Up Patient Decision:  Patient agrees to Care Plan and Follow-up.  Plan: No further  follow up required: Patient will reach out to clinical team for further questions.  AlJunius ArgylePharmD, BCPara MarchCPP  Clinical Pharmacist Practitioner  [phone]

## 2022-03-24 NOTE — Patient Instructions (Signed)
Visit Information It was great speaking with you today!  Please let me know if you have any questions about our visit.   Goals Addressed   None     Patient Care Plan: General Pharmacy (Adult)     Problem Identified: Hypertension, Hyperlipidemia, Hypothyroidism, Anxiety, and Leukemia   Priority: High     Long-Range Goal: Patient-Specific Goal   Start Date: 09/23/2021  Expected End Date: 09/24/2022  This Visit's Progress: On track  Recent Progress: On track  Priority: High  Note:   Current Barriers:  No barriers noted  Pharmacist Clinical Goal(s):  Patient will maintain control of blood pressure as evidenced by BP less than 140/90  through collaboration with PharmD and provider.   Interventions: 1:1 collaboration with [name], MD regarding development and update of comprehensive plan of care as evidenced by provider attestation and co-signature Inter-disciplinary care team collaboration (see longitudinal plan of care) Comprehensive medication review performed; medication list updated in electronic medical record  Hypertension (BP goal <140/90) -Controlled -Current treatment: Furosemide 20 mg daily: Appropriate, Effective, Safe, Accessible Metoprolol XL 100 mg daily: Appropriate, Effective, Safe, Accessible  Telmisartan 40 mg 2 tablets daily: Appropriate, Effective, Safe, Accessible  -Medications previously tried: NA  -Current home readings: 160 yesterday, normally 130s/140s.  -Current dietary habits: Limits processed foods, limits salt. Eats out multiple times weekly. Whole wheat  -Current exercise habits: Stays active walking within her house.  -Denies hypotensive/hypertensive symptoms -Recommended to continue current medication  Hyperlipidemia: (LDL goal < 70) -Controlled -Current treatment: Atorvastatin 40 mg daily: Appropriate, Effective, Safe, Accessible  -Medications previously tried: NA  -Recommended to continue current medication  Dementia/Anxiety  (Goal: Maintain symptom remission) -Controlled -managed by Dr. Manuella Ghazi, follow-up scheduled May 2023  -Current treatment: Olanzapine 2.5 mg nightly: Appropriate, Effective, Safe, Accessible  Duloxetine 20 mg daily: Appropriate, Effective, Safe, Accessible  Rivastigmine 1.5 mg daily: Appropriate, Effective, Safe, Accessible  -Medications previously tried/failed: NA -Recommended to continue current medication  Patient Goals/Self-Care Activities Patient will:  - check blood pressure weekly, document, and provide at future appointments  Follow Up Plan: No further follow up required: Patient will reach out to clinical team for further questions.    Patient agreed to services and verbal consent obtained.   The patient verbalized understanding of instructions, educational materials, and care plan provided today and DECLINED offer to receive copy of patient instructions, educational materials, and care plan.   Junius Argyle, PharmD, Para March, CPP  Clinical Pharmacist Practitioner  [facility] [phone]

## 2022-03-28 DIAGNOSIS — I10 Essential (primary) hypertension: Secondary | ICD-10-CM

## 2022-03-28 DIAGNOSIS — E785 Hyperlipidemia, unspecified: Secondary | ICD-10-CM

## 2022-04-08 ENCOUNTER — Other Ambulatory Visit: Payer: Self-pay | Admitting: Family Medicine

## 2022-04-13 ENCOUNTER — Telehealth: Payer: Self-pay

## 2022-04-13 NOTE — Telephone Encounter (Signed)
Ok to send medical records if ROI on file. If not, patient will need to sign ROI.

## 2022-04-13 NOTE — Telephone Encounter (Signed)
Copied from [place] [phone]. Topic: General - Other >> Apr 13, 2022  2:20 PM Ludger Nutting wrote: [name] with [facility] called to get a copy of the notes from patients last visit faxed to [phone].

## 2022-04-14 ENCOUNTER — Other Ambulatory Visit: Payer: Self-pay | Admitting: Family Medicine

## 2022-04-14 DIAGNOSIS — I10 Essential (primary) hypertension: Secondary | ICD-10-CM

## 2022-04-14 NOTE — Telephone Encounter (Signed)
Requested Prescriptions  Pending Prescriptions Disp Refills  . telmisartan (MICARDIS) 40 MG tablet [Pharmacy Med Name: TELMISARTAN 40 MG TABLET] 180 tablet 0    Sig: TAKE 2 TABLETS BY MOUTH EVERY DAY     Cardiovascular:  Angiotensin Receptor Blockers Passed - 04/14/2022  1:26 AM      Passed - Cr in normal range and within 180 days    Creat  Date Value Ref Range Status  04/20/2017 0.78 0.60 - 0.88 mg/dL Final    Comment:    For patients >85 years of age, the reference limit for Creatinine is approximately 13% higher for people identified as African-American. .    Creatinine, Ser  Date Value Ref Range Status  02/04/2022 0.82 0.44 - 1.00 mg/dL Final         Passed - K in normal range and within 180 days    Potassium  Date Value Ref Range Status  02/04/2022 4.1 3.5 - 5.1 mmol/L Final         Passed - Patient is not pregnant      Passed - Last BP in normal range    BP Readings from Last 1 Encounters:  12/04/21 136/60         Passed - Valid encounter within last 6 months    Recent Outpatient Visits          4 months ago Encounter for annual physical exam   [facility] [place], [name], MD   10 months ago Subclinical hypothyroidism   [facility] [place], [name], MD   11 months ago Benign essential HTN   [facility] Gwyneth Sprout, FNP   1 year ago Encounter for annual physical exam   [facility], [name], MD   1 year ago Benign essential HTN   [facility] [place], [name], MD

## 2022-05-13 ENCOUNTER — Inpatient Hospital Stay: Payer: PPO | Attending: Oncology

## 2022-05-13 ENCOUNTER — Encounter: Payer: Self-pay | Admitting: Oncology

## 2022-05-13 ENCOUNTER — Inpatient Hospital Stay (HOSPITAL_BASED_OUTPATIENT_CLINIC_OR_DEPARTMENT_OTHER): Payer: PPO | Admitting: Oncology

## 2022-05-13 VITALS — BP 118/52 | HR 55 | Temp 98.8°F | Resp 16 | Ht <= 58 in | Wt 133.0 lb

## 2022-05-13 DIAGNOSIS — Z79899 Other long term (current) drug therapy: Secondary | ICD-10-CM | POA: Insufficient documentation

## 2022-05-13 DIAGNOSIS — C9110 Chronic lymphocytic leukemia of B-cell type not having achieved remission: Secondary | ICD-10-CM | POA: Insufficient documentation

## 2022-05-13 DIAGNOSIS — C8590 Non-Hodgkin lymphoma, unspecified, unspecified site: Secondary | ICD-10-CM

## 2022-05-13 DIAGNOSIS — E871 Hypo-osmolality and hyponatremia: Secondary | ICD-10-CM | POA: Diagnosis not present

## 2022-05-13 DIAGNOSIS — N289 Disorder of kidney and ureter, unspecified: Secondary | ICD-10-CM | POA: Diagnosis not present

## 2022-05-13 LAB — COMPREHENSIVE METABOLIC PANEL
ALT: 16 U/L (ref 0–44)
AST: 28 U/L (ref 15–41)
Albumin: 3.6 g/dL (ref 3.5–5.0)
Alkaline Phosphatase: 88 U/L (ref 38–126)
Anion gap: 9 (ref 5–15)
BUN: 20 mg/dL (ref 8–23)
CO2: 27 mmol/L (ref 22–32)
Calcium: 8.5 mg/dL — ABNORMAL LOW (ref 8.9–10.3)
Chloride: 95 mmol/L — ABNORMAL LOW (ref 98–111)
Creatinine, Ser: 1.08 mg/dL — ABNORMAL HIGH (ref 0.44–1.00)
GFR, Estimated: 50 mL/min — ABNORMAL LOW (ref >60)
Glucose, Bld: 98 mg/dL (ref 70–99)
Potassium: 4.4 mmol/L (ref 3.5–5.1)
Sodium: 131 mmol/L — ABNORMAL LOW (ref 135–145)
Total Bilirubin: 0.9 mg/dL (ref 0.3–1.2)
Total Protein: 6.2 g/dL — ABNORMAL LOW (ref 6.5–8.1)

## 2022-05-13 LAB — CBC WITH DIFFERENTIAL/PLATELET
Abs Immature Granulocytes: 0.06 10*3/uL (ref 0.00–0.07)
Basophils Absolute: 0 10*3/uL (ref 0.0–0.1)
Basophils Relative: 0 %
Eosinophils Absolute: 0.1 10*3/uL (ref 0.0–0.5)
Eosinophils Relative: 1 %
HCT: 36.1 % (ref 36.0–46.0)
Hemoglobin: 11.1 g/dL — ABNORMAL LOW (ref 12.0–15.0)
Immature Granulocytes: 1 %
Lymphocytes Relative: 45 %
Lymphs Abs: 5.2 10*3/uL — ABNORMAL HIGH (ref 0.7–4.0)
MCH: 24.1 pg — ABNORMAL LOW (ref 26.0–34.0)
MCHC: 30.7 g/dL (ref 30.0–36.0)
MCV: 78.3 fL — ABNORMAL LOW (ref 80.0–100.0)
Monocytes Absolute: 1 10*3/uL (ref 0.1–1.0)
Monocytes Relative: 9 %
Neutro Abs: 4.9 10*3/uL (ref 1.7–7.7)
Neutrophils Relative %: 44 %
Platelets: 232 10*3/uL (ref 150–400)
RBC: 4.61 MIL/uL (ref 3.87–5.11)
RDW: 15.8 % — ABNORMAL HIGH (ref 11.5–15.5)
WBC: 11.2 10*3/uL — ABNORMAL HIGH (ref 4.0–10.5)
nRBC: 0 % (ref 0.0–0.2)

## 2022-05-13 NOTE — Progress Notes (Signed)
[place]  [phone]) [phone]  Fax:([phone]) [phone]     Barbara Chambers DOB: Nov 04, 1934  MR#: [id number]  GGE#:[id number]  Patient Care Team: [name], MD as PCP - General (Family Medicine) Lloyd Huger, MD as Consulting Physician (Oncology) Corey Skains, MD as Consulting Physician (Cardiology) Birder Robson, MD as Referring Physician (Ophthalmology) Neldon Labella, RN as Case Manager Germaine Pomfret, [facility] (Pharmacist)   CHIEF COMPLAINT: CLL.  INTERVAL HISTORY: Patient returns to clinic today for repeat laboratory work and further evaluation.  She continues to tolerate Imbruvica well without significant side effects.  She currently feels well and is asymptomatic. She denies any weakness or fatigue.  She has no neurologic complaints. She denies any fevers, night sweats, or weight loss. She denies any chest pain, shortness of breath, cough, or hemoptysis.  She denies any nausea, vomiting, constipation, or diarrhea. She has no urinary complaints.  Patient feels at her baseline offers no specific complaints today.  REVIEW OF SYSTEMS:   Review of Systems  Constitutional: Negative.  Negative for diaphoresis, fever, malaise/fatigue and weight loss.  Respiratory: Negative.  Negative for cough and shortness of breath.   Cardiovascular: Negative.  Negative for chest pain and leg swelling.  Gastrointestinal: Negative.  Negative for abdominal pain.  Genitourinary: Negative.  Negative for dysuria.  Musculoskeletal: Negative.  Negative for back pain.  Skin: Negative.  Negative for rash.  Neurological: Negative.  Negative for dizziness, sensory change, focal weakness, weakness and headaches.  Psychiatric/Behavioral: Negative.  Negative for depression and memory loss. The patient is not nervous/anxious.     As per HPI. Otherwise, a complete review of systems is negative.  PAST MEDICAL HISTORY: Past Medical History:  Diagnosis Date   Chronic  lymphocytic leukemia ([place]) 2011   Hyperlipidemia    Hypertension     PAST SURGICAL HISTORY: Past Surgical History:  Procedure Laterality Date   cataract surgery Bilateral 2005   CHOLECYSTECTOMY  1991   RECONSTRUCTION OF EYELID      FAMILY HISTORY Family History  Problem Relation Age of Onset   Hypertension Mother    Stroke Mother    CAD Mother    Hypertension Father    Heart attack Father    Hypertension Brother    Seizures Brother    Hypertension Son    Breast cancer Neg Hx     GYNECOLOGIC HISTORY:  No LMP recorded. Patient is postmenopausal.     ADVANCED DIRECTIVES:    HEALTH MAINTENANCE: Social History   Tobacco Use   Smoking status: Never   Smokeless tobacco: Never  Vaping Use   Vaping Use: Never used  Substance Use Topics   Alcohol use: No   Drug use: No     Colonoscopy:  PAP:  Bone density:  Lipid panel:  Allergies  Allergen Reactions   Hctz [Hydrochlorothiazide]     Hyponatremia   Penicillins Rash    Current Outpatient Medications  Medication Sig Dispense Refill   atorvastatin (LIPITOR) 40 MG tablet Take 40 mg by mouth daily.     COENZYME Q-10 PO Take 200 mg by mouth daily.      DULoxetine (CYMBALTA) 20 MG capsule Take 1 capsule (20 mg total) by mouth daily. Please schedule office visit before any future refill. 90 capsule 0   furosemide (LASIX) 20 MG tablet Take 1 tablet by mouth daily.     ibrutinib (IMBRUVICA) 420 MG tablet TAKE 1 TABLET BY MOUTH DAILY. 28 tablet 5   metoprolol succinate (TOPROL-XL)  100 MG 24 hr tablet TAKE 1 TABLET BY MOUTH EVERY DAY WITH OR IMMEDIATELY FOLLOWING A MEAL 90 tablet 0   OLANZapine (ZYPREXA) 2.5 MG tablet TAKE 1 TABLET BY MOUTH EVERYDAY AT BEDTIME 90 tablet 1   rivastigmine (EXELON) 1.5 MG capsule Take 1.5 mg by mouth 2 (two) times daily.     SODIUM FLUORIDE 5000 PPM 1.1 % PSTE See admin instructions.     telmisartan (MICARDIS) 40 MG tablet TAKE 2 TABLETS BY MOUTH EVERY DAY 180 tablet 0   vitamin B-12  (CYANOCOBALAMIN) 1000 MCG tablet Take 1,000 mcg by mouth daily.     furosemide (LASIX) 20 MG tablet Take 20 mg by mouth daily.     No current facility-administered medications for this visit.    OBJECTIVE: BP (!) 118/52 (BP Location: Left Arm, Patient Position: Sitting, Cuff Size: Normal)   Pulse (!) 55   Temp 98.8 F (37.1 C) (Tympanic)   Resp 16   Ht '4\' 10"'$  (1.473 m)   Wt 133 lb (60.3 kg)   SpO2 100%   BMI 27.80 kg/m    Body mass index is 27.8 kg/m.    ECOG FS:0 - Asymptomatic   General: Well-developed, well-nourished, no acute distress. Eyes: Pink conjunctiva, anicteric sclera. HEENT: Normocephalic, moist mucous membranes. Lungs: No audible wheezing or coughing. Heart: Regular rate and rhythm. Abdomen: Soft, nontender, no obvious distention. Musculoskeletal: No edema, cyanosis, or clubbing. Neuro: Alert, answering all questions appropriately. Cranial nerves grossly intact. Skin: No rashes or petechiae noted. Psych: Normal affect.  LAB RESULTS:  Appointment on 05/13/2022  Component Date Value Ref Range Status   Sodium 05/13/2022 131 (L)  135 - 145 mmol/L Final   Potassium 05/13/2022 4.4  3.5 - 5.1 mmol/L Final   Chloride 05/13/2022 95 (L)  98 - 111 mmol/L Final   CO2 05/13/2022 27  22 - 32 mmol/L Final   Glucose, Bld 05/13/2022 98  70 - 99 mg/dL Final   Glucose reference range applies only to samples taken after fasting for at least 8 hours.   BUN 05/13/2022 20  8 - 23 mg/dL Final   Creatinine, Ser 05/13/2022 1.08 (H)  0.44 - 1.00 mg/dL Final   Calcium 05/13/2022 8.5 (L)  8.9 - 10.3 mg/dL Final   Total Protein 05/13/2022 6.2 (L)  6.5 - 8.1 g/dL Final   Albumin 05/13/2022 3.6  3.5 - 5.0 g/dL Final   AST 05/13/2022 28  15 - 41 U/L Final   ALT 05/13/2022 16  0 - 44 U/L Final   Alkaline Phosphatase 05/13/2022 88  38 - 126 U/L Final   Total Bilirubin 05/13/2022 0.9  0.3 - 1.2 mg/dL Final   GFR, Estimated 05/13/2022 50 (L)  >60 mL/min Final   Comment: (NOTE) Calculated  using the CKD-EPI Creatinine Equation (2021)    Anion gap 05/13/2022 9  5 - 15 Final   Performed at [facility], [place], [place] [address]   WBC 05/13/2022 11.2 (H)  4.0 - 10.5 K/uL Final   RBC 05/13/2022 4.61  3.87 - 5.11 MIL/uL Final   Hemoglobin 05/13/2022 11.1 (L)  12.0 - 15.0 g/dL Final   HCT 05/13/2022 36.1  36.0 - 46.0 % Final   MCV 05/13/2022 78.3 (L)  80.0 - 100.0 fL Final   MCH 05/13/2022 24.1 (L)  26.0 - 34.0 pg Final   MCHC 05/13/2022 30.7  30.0 - 36.0 g/dL Final   RDW 05/13/2022 15.8 (H)  11.5 - 15.5 % Final  Platelets 05/13/2022 232  150 - 400 K/uL Final   nRBC 05/13/2022 0.0  0.0 - 0.2 % Final   Neutrophils Relative % 05/13/2022 44  % Final   Neutro Abs 05/13/2022 4.9  1.7 - 7.7 K/uL Final   Lymphocytes Relative 05/13/2022 45  % Final   Lymphs Abs 05/13/2022 5.2 (H)  0.7 - 4.0 K/uL Final   Monocytes Relative 05/13/2022 9  % Final   Monocytes Absolute 05/13/2022 1.0  0.1 - 1.0 K/uL Final   Eosinophils Relative 05/13/2022 1  % Final   Eosinophils Absolute 05/13/2022 0.1  0.0 - 0.5 K/uL Final   Basophils Relative 05/13/2022 0  % Final   Basophils Absolute 05/13/2022 0.0  0.0 - 0.1 K/uL Final   Immature Granulocytes 05/13/2022 1  % Final   Abs Immature Granulocytes 05/13/2022 0.06  0.00 - 0.07 K/uL Final   Performed at [facility], [address] [place], [place] [address]    STUDIES: No results found.   ASSESSMENT: CLL.  PLAN:    1.  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  Patient initiated Imbruvica in May 2021.  Patient's total white blood cell count remains mildly elevated at 11.2.  This is relatively unchanged since June 2022.  Her most recent imaging on May 29, 2021 reviewed independently with essentially stable lymphadenopathy. Continue Imbruvica as prescribed.  Return to clinic in 3 months for laboratory work only and then in 6 months for laboratory work  and further evaluation.  We will repeat imaging prior to 9-monthvisit. 2.  Anemia: Chronic and unchanged.  Patient's hemoglobin is 11.1. 3.  Renal insufficiency: Patient's creatinine is mildly increased at 1.08. 4.  Hyponatremia: Mild.  Patient's sodium level is 131.   Patient expressed understanding and was in agreement with this plan. She also understands that She can call clinic at any time with any questions, concerns, or complaints.    TLloyd Huger MD   05/13/2022 3:23 PM

## 2022-05-27 ENCOUNTER — Telehealth: Payer: Self-pay

## 2022-05-27 ENCOUNTER — Other Ambulatory Visit (HOSPITAL_COMMUNITY): Payer: Self-pay

## 2022-05-27 NOTE — Telephone Encounter (Addendum)
Oral Oncology Patient Advocate Encounter   Received notification that patient is due for re-enrollment for assistance for Imbruvica through [employer] Patient Assistance.   Re-enrollment process has been initiated and will be submitted upon completion of necessary documents.  [employer]'s phone number [phone].   I will continue to follow until final determination.  Berdine Addison, [facility] Patient [place]  [phone] (phone) [phone] (fax) 05/27/2022 11:20 AM

## 2022-05-27 NOTE — Telephone Encounter (Signed)
Left VM for patient to call back to initiate re-enrollment.   Berdine Addison, [facility] Patient [place]  [phone] (phone) [phone] (fax) 05/27/2022 11:29 AM

## 2022-05-28 ENCOUNTER — Other Ambulatory Visit: Payer: Self-pay | Admitting: Family Medicine

## 2022-05-28 DIAGNOSIS — I10 Essential (primary) hypertension: Secondary | ICD-10-CM

## 2022-05-28 NOTE — Telephone Encounter (Signed)
Requested Prescriptions  Pending Prescriptions Disp Refills   metoprolol succinate (TOPROL-XL) 100 MG 24 hr tablet [Pharmacy Med Name: METOPROLOL SUCC ER 100 MG TAB] 90 tablet 1    Sig: TAKE 1 TABLET BY MOUTH EVERY DAY WITH OR IMMEDIATELY FOLLOWING A MEAL     Cardiovascular:  Beta Blockers Passed - 05/28/2022  2:46 AM      Passed - Last BP in normal range    BP Readings from Last 1 Encounters:  05/13/22 (!) 118/52         Passed - Last Heart Rate in normal range    Pulse Readings from Last 1 Encounters:  05/13/22 (!) 55         Passed - Valid encounter within last 6 months    Recent Outpatient Visits           5 months ago Encounter for annual physical exam   [facility], [name], MD   12 months ago Subclinical hypothyroidism   [facility], [name], MD   1 year ago Benign essential HTN   [facility] Gwyneth Sprout, FNP   1 year ago Encounter for annual physical exam   [facility], [name], MD   2 years ago Benign essential HTN   [facility], [name], MD

## 2022-06-02 ENCOUNTER — Other Ambulatory Visit: Payer: Self-pay

## 2022-06-02 DIAGNOSIS — C9110 Chronic lymphocytic leukemia of B-cell type not having achieved remission: Secondary | ICD-10-CM

## 2022-06-02 DIAGNOSIS — C8590 Non-Hodgkin lymphoma, unspecified, unspecified site: Secondary | ICD-10-CM

## 2022-06-02 MED ORDER — IBRUTINIB 420 MG PO TABS: 420.0000 mg | ORAL_TABLET | Freq: Every day | ORAL | 5 refills | Status: DC

## 2022-06-02 NOTE — Telephone Encounter (Signed)
Ok to fill 

## 2022-06-03 MED ORDER — IBRUTINIB 420 MG PO TABS: 420.0000 mg | ORAL_TABLET | Freq: Every day | ORAL | 5 refills | Status: DC

## 2022-06-03 NOTE — Addendum Note (Signed)
Addended by: Darl Pikes on: 06/03/2022 10:47 AM   Modules accepted: Orders

## 2022-06-10 ENCOUNTER — Other Ambulatory Visit (HOSPITAL_COMMUNITY): Payer: Self-pay

## 2022-06-11 ENCOUNTER — Other Ambulatory Visit (HOSPITAL_COMMUNITY): Payer: Self-pay

## 2022-06-11 NOTE — Telephone Encounter (Signed)
Oral Oncology Patient Advocate Encounter  Reached out and spoke with patient regarding PAP paperwork, explained that I would send it to their preferred email via [employer].   Confirmed email address: [email or web address].    Patient expressed understanding and consent.  Will follow up once paperwork has been signed and returned.   Berdine Addison, [facility] Patient [place]  [phone] (phone) [phone] (fax) 06/11/2022 1:26 PM

## 2022-06-19 NOTE — Telephone Encounter (Signed)
Received patient signatures. I will submit once MD signatures have been returned to me.   Berdine Addison, [facility] Patient [place]  [phone] (phone) [phone] (fax) 06/19/2022 8:43 AM

## 2022-06-24 NOTE — Telephone Encounter (Signed)
Oral Oncology Patient Advocate Encounter   Submitted application for assistance for Imbruvica to [place].   Application submitted via e-fax to [phone]   [employer]'s phone number [phone].   I will continue to check the status until final determination.   Berdine Addison, [facility] Patient [place]  [phone] (phone) [phone] (fax) 06/24/2022 2:18 PM

## 2022-07-06 ENCOUNTER — Other Ambulatory Visit: Payer: Self-pay | Admitting: Family Medicine

## 2022-07-07 NOTE — Telephone Encounter (Signed)
Oral Oncology Patient Advocate Encounter   Received notification that the application for assistance for Imbruvica through [place] has been approved.   [employer]'s phone number [phone].   Effective dates: 06/29/22 through 06/29/23  I have spoken to the patient.  Berdine Addison, [facility] Patient [place]  [phone] (phone) [phone] (fax) 07/07/2022 12:52 PM

## 2022-07-16 ENCOUNTER — Telehealth: Payer: Self-pay | Admitting: Pharmacist

## 2022-07-16 ENCOUNTER — Other Ambulatory Visit: Payer: Self-pay | Admitting: Family Medicine

## 2022-07-16 DIAGNOSIS — I10 Essential (primary) hypertension: Secondary | ICD-10-CM

## 2022-07-16 NOTE — Telephone Encounter (Signed)
Requested Prescriptions  Pending Prescriptions Disp Refills   telmisartan (MICARDIS) 40 MG tablet [Pharmacy Med Name: TELMISARTAN 40 MG TABLET] 180 tablet 0    Sig: TAKE 2 TABLETS BY MOUTH EVERY DAY     Cardiovascular:  Angiotensin Receptor Blockers Failed - 07/16/2022  1:34 AM      Failed - Cr in normal range and within 180 days    Creat  Date Value Ref Range Status  04/20/2017 0.78 0.60 - 0.88 mg/dL Final    Comment:    For patients >86 years of age, the reference limit for Creatinine is approximately 13% higher for people identified as African-American. .    Creatinine, Ser  Date Value Ref Range Status  05/13/2022 1.08 (H) 0.44 - 1.00 mg/dL Final         Failed - Valid encounter within last 6 months    Recent Outpatient Visits           7 months ago Encounter for annual physical exam   [facility] [place], [name], MD   1 year ago Subclinical hypothyroidism   [facility] [place], [name], MD   1 year ago Benign essential HTN   [facility] Gwyneth Sprout, FNP   1 year ago Encounter for annual physical exam   [facility] [name], MD   2 years ago Benign essential HTN   [place], [name], MD              Passed - K in normal range and within 180 days    Potassium  Date Value Ref Range Status  05/13/2022 4.4 3.5 - 5.1 mmol/L Final         Passed - Patient is not pregnant      Passed - Last BP in normal range    BP Readings from Last 1 Encounters:  05/13/22 (!) 118/52

## 2022-07-16 NOTE — Telephone Encounter (Signed)
Oral Chemotherapy Pharmacist Encounter   Pt's son [name] called to report that they were having issues with his mom's delivery of Imbruvica from the [employer] mfg assistance program. The delivery has to be signed for and Ms. Curd can not hear the door bell when they attempt delivery. She has missed them 2-3 time recently.  [name] would like to have the medication delivered to his work but said the program would not change the address. When I called the program this issue is that the medication has to be delivered to a residential address. [name] stated that there was no family that would be home to receive the medication during the day.  When I called the mfg program back to discuss sending it to the office they stated that the medication is showing delivered to the pt's home today. For now [name] will stick with having the medication sent to the patient's home, but I was able to set him up to receive text messages from the program about shipping of medication.   Hopefully this will help with deliveries moving forward.    Darl Pikes, PharmD, BCPS, BCOP, CPP Hematology/Oncology Clinical Pharmacist [place]/DB/AP Oral [facility] [phone]  07/16/2022 1:48 PM

## 2022-07-29 ENCOUNTER — Other Ambulatory Visit: Payer: Self-pay | Admitting: Family Medicine

## 2022-07-29 DIAGNOSIS — B89 Unspecified parasitic disease: Secondary | ICD-10-CM

## 2022-07-29 NOTE — Telephone Encounter (Signed)
Requested medication (s) are due for refill today -yes  Requested medication (s) are on the active medication list -yes  Future visit scheduled -no  Last refill: 10/03/21 #90 1RF  Notes to clinic: non delegated Rx  Requested Prescriptions  Pending Prescriptions Disp Refills   OLANZapine (ZYPREXA) 2.5 MG tablet [Pharmacy Med Name: OLANZAPINE 2.5 MG TABLET] 90 tablet 1    Sig: TAKE 1 TABLET BY MOUTH EVERYDAY AT BEDTIME     Not Delegated - Psychiatry:  Antipsychotics - Second Generation (Atypical) - olanzapine Failed - 07/29/2022  1:33 AM      Failed - This refill cannot be delegated      Failed - TSH in normal range and within 360 days    TSH  Date Value Ref Range Status  12/04/2021 8.380 (H) 0.450 - 4.500 uIU/mL Final         Failed - Valid encounter within last 6 months    Recent Outpatient Visits           7 months ago Encounter for annual physical exam   [place], [name], MD   1 year ago Subclinical hypothyroidism   [place], [name], MD   1 year ago Benign essential HTN   [place] Tally Joe T, FNP   1 year ago Encounter for annual physical exam   [place], [name], MD   2 years ago Benign essential HTN   [place], [name], MD              Failed - Lipid Panel in normal range within the last 12 months    Cholesterol, Total  Date Value Ref Range Status  06/10/2021 134 100 - 199 mg/dL Final   LDL Chol Calc (NIH)  Date Value Ref Range Status  06/10/2021 58 0 - 99 mg/dL Final   HDL  Date Value Ref Range Status  06/10/2021 63 >39 mg/dL Final   Triglycerides  Date Value Ref Range Status  06/10/2021 60 0 - 149 mg/dL Final         Passed - Completed PHQ-2 or PHQ-9 in the last 360 days      Passed - Last BP in normal range    BP Readings from Last 1 Encounters:   05/13/22 (!) 118/52         Passed - Last Heart Rate in normal range    Pulse Readings from Last 1 Encounters:  05/13/22 (!) 55         Passed - CBC within normal limits and completed in the last 12 months    WBC  Date Value Ref Range Status  05/13/2022 11.2 (H) 4.0 - 10.5 K/uL Final   RBC  Date Value Ref Range Status  05/13/2022 4.61 3.87 - 5.11 MIL/uL Final   Hemoglobin  Date Value Ref Range Status  05/13/2022 11.1 (L) 12.0 - 15.0 g/dL Final  09/28/2019 12.9 11.1 - 15.9 g/dL Final   HCT  Date Value Ref Range Status  05/13/2022 36.1 36.0 - 46.0 % Final   Hematocrit  Date Value Ref Range Status  09/28/2019 41.2 34.0 - 46.6 % Final   MCHC  Date Value Ref Range Status  05/13/2022 30.7 30.0 - 36.0 g/dL Final   [facility]  Date Value Ref Range Status  05/13/2022 24.1 (L) 26.0 - 34.0 pg Final   MCV  Date Value Ref  Range Status  05/13/2022 78.3 (L) 80.0 - 100.0 fL Final  09/28/2019 87 79 - 97 fL Final  03/19/2014 84 80 - 100 fL Final   No results found for: "PLTCOUNTKUC", "LABPLAT", "POCPLA" RDW  Date Value Ref Range Status  05/13/2022 15.8 (H) 11.5 - 15.5 % Final  09/28/2019 13.7 11.7 - 15.4 % Final  03/19/2014 13.3 11.5 - 14.5 % Final         Passed - CMP within normal limits and completed in the last 12 months    Albumin  Date Value Ref Range Status  05/13/2022 3.6 3.5 - 5.0 g/dL Final  09/28/2019 4.1 3.6 - 4.6 g/dL Final  02/15/2012 3.7 3.4 - 5.0 g/dL Final   Alkaline Phosphatase  Date Value Ref Range Status  05/13/2022 88 38 - 126 U/L Final  02/15/2012 89 50 - 136 Unit/L Final   Alkaline phosphatase (APISO)  Date Value Ref Range Status  04/20/2017 83 33 - 130 U/L Final   ALT  Date Value Ref Range Status  05/13/2022 16 0 - 44 U/L Final   SGPT (ALT)  Date Value Ref Range Status  02/15/2012 27 12 - 78 U/L Final   AST  Date Value Ref Range Status  05/13/2022 28 15 - 41 U/L Final   SGOT(AST)  Date Value Ref Range Status  02/15/2012 25 15 - 37  Unit/L Final   BUN  Date Value Ref Range Status  05/13/2022 20 8 - 23 mg/dL Final  09/28/2019 16 8 - 27 mg/dL Final   Calcium  Date Value Ref Range Status  05/13/2022 8.5 (L) 8.9 - 10.3 mg/dL Final   CO2  Date Value Ref Range Status  05/13/2022 27 22 - 32 mmol/L Final   Creat  Date Value Ref Range Status  04/20/2017 0.78 0.60 - 0.88 mg/dL Final    Comment:    For patients >54 years of age, the reference limit for Creatinine is approximately 13% higher for people identified as African-American. .    Creatinine, Ser  Date Value Ref Range Status  05/13/2022 1.08 (H) 0.44 - 1.00 mg/dL Final   Glucose  Date Value Ref Range Status  04/03/2014 97 mg/dL Final   Glucose, Bld  Date Value Ref Range Status  05/13/2022 98 70 - 99 mg/dL Final    Comment:    Glucose reference range applies only to samples taken after fasting for at least 8 hours.   Glucose-Capillary  Date Value Ref Range Status  03/03/2019 112 (H) 70 - 99 mg/dL Final   Potassium  Date Value Ref Range Status  05/13/2022 4.4 3.5 - 5.1 mmol/L Final   Sodium  Date Value Ref Range Status  05/13/2022 131 (L) 135 - 145 mmol/L Final  09/28/2019 141 134 - 144 mmol/L Final   Total Bilirubin  Date Value Ref Range Status  05/13/2022 0.9 0.3 - 1.2 mg/dL Final   Bilirubin,Total  Date Value Ref Range Status  02/15/2012 0.4 0.2 - 1.0 mg/dL Final   Bilirubin Total  Date Value Ref Range Status  09/28/2019 0.4 0.0 - 1.2 mg/dL Final   Bilirubin, Direct  Date Value Ref Range Status  02/15/2012 0.1 0.00 - 0.20 mg/dL Final   Protein, ur  Date Value Ref Range Status  03/03/2019 NEGATIVE NEGATIVE mg/dL Final   Protein, UA  Date Value Ref Range Status  03/30/2019 Negative Negative Final   Total Protein  Date Value Ref Range Status  05/13/2022 6.2 (L) 6.5 - 8.1 g/dL Final  09/28/2019 6.2 6.0 - 8.5 g/dL Final  02/15/2012 6.9 6.4 - 8.2 g/dL Final   GFR, Est African American  Date Value Ref Range Status   04/20/2017 83 > OR = 60 mL/min/1.31m Final   GFR calc Af Amer  Date Value Ref Range Status  03/07/2020 >60 >60 mL/min Final   GFR, Est Non African American  Date Value Ref Range Status  04/20/2017 72 > OR = 60 mL/min/1.764mFinal   GFR, Estimated  Date Value Ref Range Status  05/13/2022 50 (L) >60 mL/min Final    Comment:    (NOTE) Calculated using the CKD-EPI Creatinine Equation (2021)             Requested Prescriptions  Pending Prescriptions Disp Refills   OLANZapine (ZYPREXA) 2.5 MG tablet [Pharmacy Med Name: OLANZAPINE 2.5 MG TABLET] 90 tablet 1    Sig: TAKE 1 TABLET BY MOUTH EVERYDAY AT BEDTIME     Not Delegated - Psychiatry:  Antipsychotics - Second Generation (Atypical) - olanzapine Failed - 07/29/2022  1:33 AM      Failed - This refill cannot be delegated      Failed - TSH in normal range and within 360 days    TSH  Date Value Ref Range Status  12/04/2021 8.380 (H) 0.450 - 4.500 uIU/mL Final         Failed - Valid encounter within last 6 months    Recent Outpatient Visits           7 months ago Encounter for annual physical exam   [place]   1 year ago Subclinical hypothy[place]   [place]   1 year ago Benign essential HTN   [place] FNP   1 year ago Encounter for annual physical exam   [place] [facility]   2 years ago Benign essential HTN   [place]              Failed - Lipid Panel in normal range within the last 12 months    Cholesterol, Total  Date Value Ref Range Status  06/10/2021 134 100 - 199 mg/dL Final   LDL Chol Calc (NIH)  Date Value Ref Range Status  06/10/2021 58 0 - 99 mg/dL Final   HDL  Date Value Ref Range Status  06/10/2021 63 >39 mg/dL Final   Triglycerides  Date  Value Ref Range Status  06/10/2021 60 0 - 149 mg/dL Final         Passed - Completed PHQ-2 or PHQ-9 in the last 360 days      Passed - Last BP in normal range    BP Readings from Last 1 Encounters:  05/13/22 (!) 118/52         Passed - Last Heart Rate in normal range    Pulse Readings from Last 1 Encounters:  05/13/22 (!) 55         Passed - CBC within normal limits and completed in the last 12 months    WBC  Date Value Ref Range Status  05/13/2022 11.2 (H) 4.0 - 10.5 K/uL Final   RBC  Date Value Ref Range Status  05/13/2022 4.61 3.87 - 5.11 MIL/uL Final   Hemoglobin  Date Value Ref Range Status  05/13/2022 11.1 (L) 12.0 - 15.0 g/dL Final  09/28/2019  12.9 11.1 - 15.9 g/dL Final   HCT  Date Value Ref Range Status  05/13/2022 36.1 36.0 - 46.0 % Final   Hematocrit  Date Value Ref Range Status  09/28/2019 41.2 34.0 - 46.6 % Final   MCHC  Date Value Ref Range Status  05/13/2022 30.7 30.0 - 36.0 g/dL Final   [facility]  Date Value Ref Range Status  05/13/2022 24.1 (L) 26.0 - 34.0 pg Final   MCV  Date Value Ref Range Status  05/13/2022 78.3 (L) 80.0 - 100.0 fL Final  09/28/2019 87 79 - 97 fL Final  03/19/2014 84 80 - 100 fL Final   No results found for: "PLTCOUNTKUC", "LABPLAT", "POCPLA" RDW  Date Value Ref Range Status  05/13/2022 15.8 (H) 11.5 - 15.5 % Final  09/28/2019 13.7 11.7 - 15.4 % Final  03/19/2014 13.3 11.5 - 14.5 % Final         Passed - CMP within normal limits and completed in the last 12 months    Albumin  Date Value Ref Range Status  05/13/2022 3.6 3.5 - 5.0 g/dL Final  09/28/2019 4.1 3.6 - 4.6 g/dL Final  02/15/2012 3.7 3.4 - 5.0 g/dL Final   Alkaline Phosphatase  Date Value Ref Range Status  05/13/2022 88 38 - 126 U/L Final  02/15/2012 89 50 - 136 Unit/L Final   Alkaline phosphatase (APISO)  Date Value Ref Range Status  04/20/2017 83 33 - 130 U/L Final   ALT  Date Value Ref Range Status  05/13/2022 16 0 - 44 U/L Final   SGPT (ALT)   Date Value Ref Range Status  02/15/2012 27 12 - 78 U/L Final   AST  Date Value Ref Range Status  05/13/2022 28 15 - 41 U/L Final   SGOT(AST)  Date Value Ref Range Status  02/15/2012 25 15 - 37 Unit/L Final   BUN  Date Value Ref Range Status  05/13/2022 20 8 - 23 mg/dL Final  09/28/2019 16 8 - 27 mg/dL Final   Calcium  Date Value Ref Range Status  05/13/2022 8.5 (L) 8.9 - 10.3 mg/dL Final   CO2  Date Value Ref Range Status  05/13/2022 27 22 - 32 mmol/L Final   Creat  Date Value Ref Range Status  04/20/2017 0.78 0.60 - 0.88 mg/dL Final    Comment:    For patients >45 years of age, the reference limit for Creatinine is approximately 13% higher for people identified as African-American. .    Creatinine, Ser  Date Value Ref Range Status  05/13/2022 1.08 (H) 0.44 - 1.00 mg/dL Final   Glucose  Date Value Ref Range Status  04/03/2014 97 mg/dL Final   Glucose, Bld  Date Value Ref Range Status  05/13/2022 98 70 - 99 mg/dL Final    Comment:    Glucose reference range applies only to samples taken after fasting for at least 8 hours.   Glucose-Capillary  Date Value Ref Range Status  03/03/2019 112 (H) 70 - 99 mg/dL Final   Potassium  Date Value Ref Range Status  05/13/2022 4.4 3.5 - 5.1 mmol/L Final   Sodium  Date Value Ref Range Status  05/13/2022 131 (L) 135 - 145 mmol/L Final  09/28/2019 141 134 - 144 mmol/L Final   Total Bilirubin  Date Value Ref Range Status  05/13/2022 0.9 0.3 - 1.2 mg/dL Final   Bilirubin,Total  Date Value Ref Range Status  02/15/2012 0.4 0.2 - 1.0 mg/dL Final   Bilirubin Total  Date Value Ref Range Status  09/28/2019 0.4 0.0 - 1.2 mg/dL Final   Bilirubin, Direct  Date Value Ref Range Status  02/15/2012 0.1 0.00 - 0.20 mg/dL Final   Protein, ur  Date Value Ref Range Status  03/03/2019 NEGATIVE NEGATIVE mg/dL Final   Protein, UA  Date Value Ref Range Status  03/30/2019 Negative Negative Final   Total Protein  Date Value  Ref Range Status  05/13/2022 6.2 (L) 6.5 - 8.1 g/dL Final  09/28/2019 6.2 6.0 - 8.5 g/dL Final  02/15/2012 6.9 6.4 - 8.2 g/dL Final   GFR, Est African American  Date Value Ref Range Status  04/20/2017 83 > OR = 60 mL/min/1.8m Final   GFR calc Af Amer  Date Value Ref Range Status  03/07/2020 >60 >60 mL/min Final   GFR, Est Non African American  Date Value Ref Range Status  04/20/2017 72 > OR = 60 mL/min/1.761mFinal   GFR, Estimated  Date Value Ref Range Status  05/13/2022 50 (L) >60 mL/min Final    Comment:    (NOTE) Calculated using the CKD-EPI Creatinine Equation (2021)

## 2022-08-12 ENCOUNTER — Other Ambulatory Visit: Payer: Self-pay | Admitting: *Deleted

## 2022-08-12 DIAGNOSIS — C9110 Chronic lymphocytic leukemia of B-cell type not having achieved remission: Secondary | ICD-10-CM

## 2022-08-12 DIAGNOSIS — C8590 Non-Hodgkin lymphoma, unspecified, unspecified site: Secondary | ICD-10-CM

## 2022-08-13 ENCOUNTER — Inpatient Hospital Stay: Payer: PPO | Attending: Oncology

## 2022-08-13 DIAGNOSIS — C9110 Chronic lymphocytic leukemia of B-cell type not having achieved remission: Secondary | ICD-10-CM | POA: Insufficient documentation

## 2022-08-13 DIAGNOSIS — C8590 Non-Hodgkin lymphoma, unspecified, unspecified site: Secondary | ICD-10-CM

## 2022-08-13 LAB — CMP (CANCER CENTER ONLY)
ALT: 16 U/L (ref 0–44)
AST: 29 U/L (ref 15–41)
Albumin: 3.4 g/dL — ABNORMAL LOW (ref 3.5–5.0)
Alkaline Phosphatase: 88 U/L (ref 38–126)
Anion gap: 9 (ref 5–15)
BUN: 21 mg/dL (ref 8–23)
CO2: 25 mmol/L (ref 22–32)
Calcium: 8.4 mg/dL — ABNORMAL LOW (ref 8.9–10.3)
Chloride: 102 mmol/L (ref 98–111)
Creatinine: 0.91 mg/dL (ref 0.44–1.00)
GFR, Estimated: >60 mL/min (ref >60)
Glucose, Bld: 94 mg/dL (ref 70–99)
Potassium: 3.8 mmol/L (ref 3.5–5.1)
Sodium: 136 mmol/L (ref 135–145)
Total Bilirubin: 0.7 mg/dL (ref 0.3–1.2)
Total Protein: 6 g/dL — ABNORMAL LOW (ref 6.5–8.1)

## 2022-08-13 LAB — CBC WITH DIFFERENTIAL/PLATELET
Abs Immature Granulocytes: 0.06 10*3/uL (ref 0.00–0.07)
Basophils Absolute: 0.1 10*3/uL (ref 0.0–0.1)
Basophils Relative: 0 %
Eosinophils Absolute: 0.1 10*3/uL (ref 0.0–0.5)
Eosinophils Relative: 1 %
HCT: 31.6 % — ABNORMAL LOW (ref 36.0–46.0)
Hemoglobin: 9.3 g/dL — ABNORMAL LOW (ref 12.0–15.0)
Immature Granulocytes: 1 %
Lymphocytes Relative: 41 %
Lymphs Abs: 5.5 10*3/uL — ABNORMAL HIGH (ref 0.7–4.0)
MCH: 20.5 pg — ABNORMAL LOW (ref 26.0–34.0)
MCHC: 29.4 g/dL — ABNORMAL LOW (ref 30.0–36.0)
MCV: 69.8 fL — ABNORMAL LOW (ref 80.0–100.0)
Monocytes Absolute: 1.3 10*3/uL — ABNORMAL HIGH (ref 0.1–1.0)
Monocytes Relative: 10 %
Neutro Abs: 6.4 10*3/uL (ref 1.7–7.7)
Neutrophils Relative %: 47 %
Platelets: 279 10*3/uL (ref 150–400)
RBC: 4.53 MIL/uL (ref 3.87–5.11)
RDW: 15.8 % — ABNORMAL HIGH (ref 11.5–15.5)
Smear Review: NORMAL
WBC: 13.3 10*3/uL — ABNORMAL HIGH (ref 4.0–10.5)
nRBC: 0 % (ref 0.0–0.2)

## 2022-10-13 ENCOUNTER — Other Ambulatory Visit: Payer: Self-pay | Admitting: Family Medicine

## 2022-10-13 ENCOUNTER — Ambulatory Visit (INDEPENDENT_AMBULATORY_CARE_PROVIDER_SITE_OTHER): Payer: PPO

## 2022-10-13 VITALS — BP 108/66 | Ht 61.0 in | Wt 129.7 lb

## 2022-10-13 DIAGNOSIS — Z0000 Encounter for general adult medical examination without abnormal findings: Secondary | ICD-10-CM | POA: Diagnosis not present

## 2022-10-13 DIAGNOSIS — I10 Essential (primary) hypertension: Secondary | ICD-10-CM

## 2022-10-13 NOTE — Progress Notes (Signed)
Subjective:   Barbara Chambers is a 88 y.o. female who presents for Medicare Annual (Subsequent) preventive examination.  Review of Systems    Cardiac Risk Factors include: advanced age (>66men, >72 women);dyslipidemia;hypertension;sedentary lifestyle    Objective:    Today's Vitals   10/13/22 0854  Weight: 129 lb 11.2 oz (58.8 kg)  Height: 5\' 1"  (1.549 m)   Body mass index is 24.51 kg/m.     10/13/2022    9:05 AM 05/13/2022   11:35 AM 11/04/2021    1:53 PM 10/08/2021    9:05 AM 09/02/2021   10:39 AM 10/03/2020    8:30 AM 05/20/2020    1:13 PM  Advanced Directives  Does Patient Have a Medical Advance Directive? Yes Yes Yes No Yes Yes Yes  Type of Special educational needs teacher of [place];Living will Healthcare Power of [place];Living will   Healthcare Power of [place];Living will Healthcare Power of [place];Living will  Does patient want to make changes to medical advance directive?     No - Patient declined  Yes (MAU/Ambulatory/Procedural Areas - Information given)  Copy of Healthcare Power of Attorney in Chart?      No - copy requested   Would patient like information on creating a medical advance directive?    No - Patient declined No - Patient declined      Current Medications (verified) Outpatient Encounter Medications as of 10/13/2022  Medication Sig   atorvastatin (LIPITOR) 40 MG tablet Take 40 mg by mouth daily.   COENZYME Q-10 PO Take 200 mg by mouth daily.    DULoxetine (CYMBALTA) 20 MG capsule Take 1 capsule (20 mg total) by mouth daily. Please schedule office visit before any future refill.   furosemide (LASIX) 20 MG tablet Take 1 tablet by mouth daily.   ibrutinib (IMBRUVICA) 420 MG tablet TAKE 1 TABLET BY MOUTH DAILY.   metoprolol succinate (TOPROL-XL) 100 MG 24 hr tablet TAKE 1 TABLET BY MOUTH EVERY DAY WITH OR IMMEDIATELY FOLLOWING A MEAL   OLANZapine (ZYPREXA) 2.5 MG tablet TAKE 1 TABLET BY MOUTH EVERYDAY AT BEDTIME   rivastigmine (EXELON) 1.5 MG capsule  Take 1.5 mg by mouth 2 (two) times daily.   SODIUM FLUORIDE 5000 PPM 1.1 % PSTE See admin instructions.   telmisartan (MICARDIS) 40 MG tablet TAKE 2 TABLETS BY MOUTH EVERY DAY   vitamin B-12 (CYANOCOBALAMIN) 1000 MCG tablet Take 1,000 mcg by mouth daily.   furosemide (LASIX) 20 MG tablet Take 20 mg by mouth daily.   No facility-administered encounter medications on file as of 10/13/2022.    Allergies (verified) Hctz [hydrochlorothiazide] and Penicillins   History: Past Medical History:  Diagnosis Date   Chronic lymphocytic leukemia 2011   Hyperlipidemia    Hypertension    Past Surgical History:  Procedure Laterality Date   cataract surgery Bilateral 2005   CHOLECYSTECTOMY  1991   RECONSTRUCTION OF EYELID     Family History  Problem Relation Age of Onset   Hypertension Mother    Stroke Mother    CAD Mother    Hypertension Father    Heart attack Father    Hypertension Brother    Seizures Brother    Hypertension Son    Breast cancer Neg Hx    Social History   Socioeconomic History   Marital status: Widowed    Spouse name: Rushie Goltz   Number of children: 3   Years of education: Not on file   Highest education level: 12th grade  Occupational History  Occupation: retired  Tobacco Use   Smoking status: Never   Smokeless tobacco: Never  Vaping Use   Vaping Use: Never used  Substance and Sexual Activity   Alcohol use: No   Drug use: No   Sexual activity: Not on file  Other Topics Concern   Not on file  Social History Narrative   Pt has 1 son that was killed in a car accident at age 82.    Son [name] is primary caregiver   Social Determinants of Health   Financial Resource Strain: Low Risk  (10/13/2022)   Overall Financial Resource Strain (CARDIA)    Difficulty of Paying Living Expenses: Not very hard  Food Insecurity: No Food Insecurity (10/08/2021)   Hunger Vital Sign    Worried About Running Out of Food in the Last Year: Never true    Ran Out of Food in the  Last Year: Never true  Transportation Needs: No Transportation Needs (10/13/2022)   PRAPARE - Administrator, Civil Service (Medical): No    Lack of Transportation (Non-Medical): No  Physical Activity: Insufficiently Active (10/13/2022)   Exercise Vital Sign    Days of Exercise per Week: 4 days    Minutes of Exercise per Session: 30 min  Stress: No Stress Concern Present (10/13/2022)   [employer] of Occupational Health - Occupational Stress Questionnaire    Feeling of Stress : Only a little  Social Connections: Moderately Isolated (10/13/2022)   Social Connection and Isolation Panel [NHANES]    Frequency of Communication with Friends and Family: More than three times a week    Frequency of Social Gatherings with Friends and Family: Once a week    Attends Religious Services: More than 4 times per year    Active Member of [employer] or Organizations: No    Attends Banker Meetings: Never    Marital Status: Widowed    Tobacco Counseling Counseling given: Not Answered   Clinical Intake:  Pre-visit preparation completed: Yes  Pain : No/denies pain     BMI - recorded: 24.51 Nutritional Status: BMI of 19-24  Normal Nutritional Risks: None Diabetes: No  How often do you need to have someone help you when you read instructions, pamphlets, or other written materials from your doctor or pharmacy?: 1 - Never  Diabetic?no  Interpreter Needed?: No  Comments: live alone Information entered by :: B.Christyann Manolis,LPN   Activities of Daily Living    10/13/2022    9:05 AM  In your present state of health, do you have any difficulty performing the following activities:  Hearing? 0  Vision? 0  Difficulty concentrating or making decisions? 1  Walking or climbing stairs? 0  Dressing or bathing? 0  Doing errands, shopping? 0  Preparing Food and eating ? N  Using the Toilet? N  In the past six months, have you accidently leaked urine? Y  Do you have problems with  loss of bowel control? Y  Managing your Medications? Y  Managing your Finances? Y  Housekeeping or managing your Housekeeping? N    Patient Care Team: Erasmo Downer, MD as PCP - General (Family Medicine) Jeralyn Ruths, MD as Consulting Physician (Oncology) Lamar Blinks, MD as Consulting Physician (Cardiology) Galen Manila, MD as Referring Physician (Ophthalmology) Juanell Fairly, RN as Case Manager Gaspar Cola, [facility] (Pharmacist)  Indicate any recent Medical Services you may have received from other than [facility] providers in the past year (date may be approximate).  Assessment:   This is a routine wellness examination for Zaniyah.  Hearing/Vision screen Hearing Screening - Comments:: Adequate hearing Vision Screening - Comments:: Adequate vision w/glasses [facility]  Dietary issues and exercise activities discussed: Current Exercise Habits: The patient does not participate in regular exercise at present, Exercise limited by: orthopedic condition(s)   Goals Addressed             This Visit's Progress    Exercise   On track    Starting 06/24/16, I will start back walking 4 days a week for 45 minutes.     LIFESTYLE - DECREASE FALLS RISK   On track    Recommend to drink at least 6-8 8oz glasses of water per day.        Depression Screen    10/13/2022    8:59 AM 12/04/2021   10:24 AM 10/08/2021    9:04 AM 04/25/2021   11:26 AM 02/07/2021    9:49 AM 10/03/2020    8:27 AM 05/27/2020    9:26 AM  PHQ 2/9 Scores  PHQ - 2 Score 0 0 0 0 0 0 0  PHQ- 9 Score  0  0   1    Fall Risk    10/13/2022    8:56 AM 10/08/2021    9:06 AM 02/07/2021    9:58 AM 10/03/2020    8:30 AM 05/27/2020    9:25 AM  Fall Risk   Falls in the past year? 0 0 0 1 1  Number falls in past yr: 0 0 0 0 0  Injury with Fall? 0 0  0 0  Risk for fall due to : No Fall Risks No Fall Risks Medication side effect  No Fall Risks  Follow up Education provided;Falls prevention  discussed Falls evaluation completed Falls prevention discussed Falls prevention discussed Falls evaluation completed    FALL RISK PREVENTION PERTAINING TO THE HOME:  Any stairs in or around the home? Yes  If so, are there any without handrails? Yes  Home free of loose throw rugs in walkways, pet beds, electrical cords, etc? Yes  Adequate lighting in your home to reduce risk of falls? Yes   ASSISTIVE DEVICES UTILIZED TO PREVENT FALLS:  Life alert? No  Use of a cane, walker or w/c? No  Grab bars in the bathroom? Yes  Shower chair or bench in shower? Yes does not  Elevated toilet seat or a handicapped toilet? Yes   TIMED UP AND GO:  Was the test performed? Yes .  Length of time to ambulate 10 feet: 10 sec.   Gait steady and fast without use of assistive device  Cognitive Function:        10/13/2022    9:09 AM 12/04/2021   10:25 AM 01/20/2019   11:50 AM 06/24/2016    9:13 AM  6CIT Screen  What Year? 0 points 0 points 0 points 0 points  What month? 0 points 0 points 0 points 0 points  What time? 0 points 0 points 0 points 0 points  Count back from 20 0 points 0 points 0 points 0 points  Months in reverse 4 points 0 points 0 points 0 points  Repeat phrase 2 points 4 points 0 points 2 points  Total Score 6 points 4 points 0 points 2 points    Immunizations Immunization History  Administered Date(s) Administered   Fluad Quad(high Dose 65+) 03/17/2019, 03/07/2020, 04/25/2021   Influenza, High Dose Seasonal PF 06/24/2016, 04/20/2017, 05/04/2018  PFIZER Comirnaty(Gray Top)Covid-19 Tri-Sucrose Vaccine 11/19/2020   PFIZER(Purple Top)SARS-COV-2 Vaccination 09/02/2019, 03/07/2020   Pneumococcal Conjugate-13 03/30/2014   Pneumococcal Polysaccharide-23 04/20/2017   Td 04/05/2017   Tdap 07/14/2005    TDAP status: Up to date  Flu Vaccine status: Up to date  Pneumococcal vaccine status: Up to date  Covid-19 vaccine status: Completed vaccines  Qualifies for Shingles Vaccine?  Yes   Zostavax completed Yes   Shingrix Completed?: Yes  Screening Tests Health Maintenance  Topic Date Due   Zoster Vaccines- Shingrix (1 of 2) Never done   COVID-19 Vaccine (4 - 2023-24 season) 02/27/2022   INFLUENZA VACCINE  01/28/2023   Medicare Annual Wellness (AWV)  10/13/2023   DTaP/Tdap/Td (3 - Td or Tdap) 04/06/2027   Pneumonia Vaccine 43+ Years old  Completed   DEXA SCAN  Completed   HPV VACCINES  Aged Out    Health Maintenance  Health Maintenance Due  Topic Date Due   Zoster Vaccines- Shingrix (1 of 2) Never done   COVID-19 Vaccine (4 - 2023-24 season) 02/27/2022    Colorectal cancer screening: No longer required.   Mammogram status: No longer required due to age.  Bone Density status: Completed yes. Results reflect: Bone density results: NORMAL. Repeat every 5 years.  Lung Cancer Screening: (Low Dose CT Chest recommended if Age 55-80 years, 30 pack-year currently smoking OR have quit w/in 15years.) does not qualify.   Lung Cancer Screening Referral: no  Additional Screening:  Hepatitis C Screening: does not qualify; Completed yes  Vision Screening: Recommended annual ophthalmology exams for early detection of glaucoma and other disorders of the eye. Is the patient up to date with their annual eye exam?  Yes  Who is the provider or what is the name of the office in which the patient attends annual eye exams? [facility] If pt is not established with a provider, would they like to be referred to a provider to establish care? No .   Dental Screening: Recommended annual dental exams for proper oral hygiene  Community Resource Referral / Chronic Care Management: CRR required this visit?  No   CCM required this visit?  No     Plan:     I have personally reviewed and noted the following in the patient's chart:   Medical and social history Use of alcohol, tobacco or illicit drugs  Current medications and supplements including opioid prescriptions.  Patient is not currently taking opioid prescriptions. Functional ability and status Nutritional status Physical activity Advanced directives List of other physicians Hospitalizations, surgeries, and ER visits in previous 12 months Vitals Screenings to include cognitive, depression, and falls Referrals and appointments  In addition, I have reviewed and discussed with patient certain preventive protocols, quality metrics, and best practice recommendations. A written personalized care plan for preventive services as well as general preventive health recommendations were provided to patient.    Sue Lush, LPN   [id number]   Nurse Notes: pt brought in by her son for visit. She reports doing well. She still takes takes care of her home with the help of her son (he manages finances and things she cannot). Pt reports she has a rash on the right-side of her face that will not go away.  *Pt made appt w/PCP for rash

## 2022-10-13 NOTE — Patient Instructions (Signed)
Barbara Chambers , Thank you for taking time to come for your Medicare Wellness Visit. I appreciate your ongoing commitment to your health goals. Please review the following plan we discussed and let me know if I can assist you in the future.   These are the goals we discussed:  Goals       "I need to make sure mom follows up with a psychiatrist" (pt-stated)      Current Barriers:  Mental Health Concerns   Clinical Social Work Clinical Goal(s):  Over the next 30 days, client will follow up with [facility] as directed by patient's provider to address continued delusion that bed bugs remain in her home  Interventions: [name] who is on DPR  interviewed and appropriate assessments performed Followed up on  referral to [facility] to address delusional disorder Confirmed that patient appointment scheduled for 03/28/19 with Yvette Rack ANP-C for medication assessment was kept Patient's EMR confirmed that patient does not have a UTI and she can now stop the antibiotics Encouraged patient's son to continue to support patient with ongoing mental health follow up with [facility] for medication management. [name] provided  with direct contact information for this social worker in the event that any community resource needs arise   Patient Self Care Activities:  Attends all scheduled provider appointments Performs ADL's independently Unable to perform IADLs independently  Please see past updates related to this goal by clicking on the "Past Updates" button in the selected goal        DIET - EAT MORE FRUITS AND VEGETABLES      Exercise      Starting 06/24/16, I will start back walking 4 days a week for 45 minutes.      LIFESTYLE - DECREASE FALLS RISK      Recommend to drink at least 6-8 8oz glasses of water per day.       Prevent falls      Recommend to remove any items from the home that may cause slips or trips.      Track and Manage My Blood  Pressure-Hypertension      Timeframe:  Long-Range Goal Priority:  High Start Date: 09/23/21                             Expected End Date: 09/24/22                      Follow Up within 90 days   - check blood pressure 3 times per week    Why is this important?   You won't feel high blood pressure, but it can still hurt your blood vessels.  High blood pressure can cause heart or kidney problems. It can also cause a stroke.  Making lifestyle changes like losing a little weight or eating less salt will help.  Checking your blood pressure at home and at different times of the day can help to control blood pressure.  If the doctor prescribes medicine remember to take it the way the doctor ordered.  Call the office if you cannot afford the medicine or if there are questions about it.     Notes:         This is a list of the screening recommended for you and due dates:  Health Maintenance  Topic Date Due   Zoster (Shingles) Vaccine (1 of 2) Never done   COVID-19 Vaccine (  4 - 2023-24 season) 02/27/2022   Flu Shot  01/28/2023   Medicare Annual Wellness Visit  10/13/2023   DTaP/Tdap/Td vaccine (3 - Td or Tdap) 04/06/2027   Pneumonia Vaccine  Completed   DEXA scan (bone density measurement)  Completed   HPV Vaccine  Aged Out    Advanced directives: yes  Conditions/risks identified: low falls risk  Next appointment: Follow up in one year for your annual wellness visit 10/18/2023 @8 :45am in person   Preventive Care 65 Years and Older, Female Preventive care refers to lifestyle choices and visits with your health care provider that can promote health and wellness. What does preventive care include? A yearly physical exam. This is also called an annual well check. Dental exams once or twice a year. Routine eye exams. Ask your health care provider how often you should have your eyes checked. Personal lifestyle choices, including: Daily care of your teeth and gums. Regular physical  activity. Eating a healthy diet. Avoiding tobacco and drug use. Limiting alcohol use. Practicing safe sex. Taking low-dose aspirin every day. Taking vitamin and mineral supplements as recommended by your health care provider. What happens during an annual well check? The services and screenings done by your health care provider during your annual well check will depend on your age, overall health, lifestyle risk factors, and family history of disease. Counseling  Your health care provider may ask you questions about your: Alcohol use. Tobacco use. Drug use. Emotional well-being. Home and relationship well-being. Sexual activity. Eating habits. History of falls. Memory and ability to understand (cognition). Work and work Astronomer. Reproductive health. Screening  You may have the following tests or measurements: Height, weight, and BMI. Blood pressure. Lipid and cholesterol levels. These may be checked every 5 years, or more frequently if you are over 66 years old. Skin check. Lung cancer screening. You may have this screening every year starting at age 69 if you have a 30-pack-year history of smoking and currently smoke or have quit within the past 15 years. Fecal occult blood test (FOBT) of the stool. You may have this test every year starting at age 53. Flexible sigmoidoscopy or colonoscopy. You may have a sigmoidoscopy every 5 years or a colonoscopy every 10 years starting at age 26. Hepatitis C blood test. Hepatitis B blood test. Sexually transmitted disease (STD) testing. Diabetes screening. This is done by checking your blood sugar (glucose) after you have not eaten for a while (fasting). You may have this done every 1-3 years. Bone density scan. This is done to screen for osteoporosis. You may have this done starting at age 56. Mammogram. This may be done every 1-2 years. Talk to your health care provider about how often you should have regular mammograms. Talk with your  health care provider about your test results, treatment options, and if necessary, the need for more tests. Vaccines  Your health care provider may recommend certain vaccines, such as: Influenza vaccine. This is recommended every year. Tetanus, diphtheria, and acellular pertussis (Tdap, Td) vaccine. You may need a Td booster every 10 years. Zoster vaccine. You may need this after age 46. Pneumococcal 13-valent conjugate (PCV13) vaccine. One dose is recommended after age 39. Pneumococcal polysaccharide (PPSV23) vaccine. One dose is recommended after age 31. Talk to your health care provider about which screenings and vaccines you need and how often you need them. This information is not intended to replace advice given to you by your health care provider. Make sure you discuss any questions  you have with your health care provider. Document Released: 07/12/2015 Document Revised: 03/04/2016 Document Reviewed: 04/16/2015 Elsevier Interactive Patient Education  2017 [employer].  Fall Prevention in the Home Falls can cause injuries. They can happen to people of all ages. There are many things you can do to make your home safe and to help prevent falls. What can I do on the outside of my home? Regularly fix the edges of walkways and driveways and fix any cracks. Remove anything that might make you trip as you walk through a door, such as a raised step or threshold. Trim any bushes or trees on the path to your home. Use bright outdoor lighting. Clear any walking paths of anything that might make someone trip, such as rocks or tools. Regularly check to see if handrails are loose or broken. Make sure that both sides of any steps have handrails. Any raised decks and porches should have guardrails on the edges. Have any leaves, snow, or ice cleared regularly. Use sand or salt on walking paths during winter. Clean up any spills in your garage right away. This includes oil or grease spills. What can I  do in the bathroom? Use night lights. Install grab bars by the toilet and in the tub and shower. Do not use towel bars as grab bars. Use non-skid mats or decals in the tub or shower. If you need to sit down in the shower, use a plastic, non-slip stool. Keep the floor dry. Clean up any water that spills on the floor as soon as it happens. Remove soap buildup in the tub or shower regularly. Attach bath mats securely with double-sided non-slip rug tape. Do not have throw rugs and other things on the floor that can make you trip. What can I do in the bedroom? Use night lights. Make sure that you have a light by your bed that is easy to reach. Do not use any sheets or blankets that are too big for your bed. They should not hang down onto the floor. Have a firm chair that has side arms. You can use this for support while you get dressed. Do not have throw rugs and other things on the floor that can make you trip. What can I do in the kitchen? Clean up any spills right away. Avoid walking on wet floors. Keep items that you use a lot in easy-to-reach places. If you need to reach something above you, use a strong step stool that has a grab bar. Keep electrical cords out of the way. Do not use floor polish or wax that makes floors slippery. If you must use wax, use non-skid floor wax. Do not have throw rugs and other things on the floor that can make you trip. What can I do with my stairs? Do not leave any items on the stairs. Make sure that there are handrails on both sides of the stairs and use them. Fix handrails that are broken or loose. Make sure that handrails are as long as the stairways. Check any carpeting to make sure that it is firmly attached to the stairs. Fix any carpet that is loose or worn. Avoid having throw rugs at the top or bottom of the stairs. If you do have throw rugs, attach them to the floor with carpet tape. Make sure that you have a light switch at the top of the stairs  and the bottom of the stairs. If you do not have them, ask someone to add them for  you. What else can I do to help prevent falls? Wear shoes that: Do not have high heels. Have rubber bottoms. Are comfortable and fit you well. Are closed at the toe. Do not wear sandals. If you use a stepladder: Make sure that it is fully opened. Do not climb a closed stepladder. Make sure that both sides of the stepladder are locked into place. Ask someone to hold it for you, if possible. Clearly mark and make sure that you can see: Any grab bars or handrails. First and last steps. Where the edge of each step is. Use tools that help you move around (mobility aids) if they are needed. These include: Canes. Walkers. Scooters. Crutches. Turn on the lights when you go into a dark area. Replace any light bulbs as soon as they burn out. Set up your furniture so you have a clear path. Avoid moving your furniture around. If any of your floors are uneven, fix them. If there are any pets around you, be aware of where they are. Review your medicines with your doctor. Some medicines can make you feel dizzy. This can increase your chance of falling. Ask your doctor what other things that you can do to help prevent falls. This information is not intended to replace advice given to you by your health care provider. Make sure you discuss any questions you have with your health care provider. Document Released: 04/11/2009 Document Revised: 11/21/2015 Document Reviewed: 07/20/2014 Elsevier Interactive Patient Education  2017 [employer].

## 2022-10-15 ENCOUNTER — Ambulatory Visit: Payer: PPO | Admitting: Family Medicine

## 2022-10-27 ENCOUNTER — Other Ambulatory Visit: Payer: Self-pay | Admitting: Family Medicine

## 2022-10-30 ENCOUNTER — Ambulatory Visit (INDEPENDENT_AMBULATORY_CARE_PROVIDER_SITE_OTHER): Payer: PPO | Admitting: Family Medicine

## 2022-10-30 VITALS — BP 122/51 | HR 58 | Temp 98.7°F | Wt 126.0 lb

## 2022-10-30 DIAGNOSIS — D692 Other nonthrombocytopenic purpura: Secondary | ICD-10-CM | POA: Diagnosis not present

## 2022-10-30 DIAGNOSIS — E782 Mixed hyperlipidemia: Secondary | ICD-10-CM | POA: Diagnosis not present

## 2022-10-30 DIAGNOSIS — E538 Deficiency of other specified B group vitamins: Secondary | ICD-10-CM

## 2022-10-30 DIAGNOSIS — C9110 Chronic lymphocytic leukemia of B-cell type not having achieved remission: Secondary | ICD-10-CM | POA: Diagnosis not present

## 2022-10-30 DIAGNOSIS — I10 Essential (primary) hypertension: Secondary | ICD-10-CM | POA: Diagnosis not present

## 2022-10-30 DIAGNOSIS — L989 Disorder of the skin and subcutaneous tissue, unspecified: Secondary | ICD-10-CM

## 2022-10-30 DIAGNOSIS — F331 Major depressive disorder, recurrent, moderate: Secondary | ICD-10-CM

## 2022-10-30 NOTE — Assessment & Plan Note (Signed)
Ordered CBC with differential to be obtained in 2 weeks, so patient is at 67-month mark from prior labs.

## 2022-10-30 NOTE — Progress Notes (Signed)
Established patient visit   Patient: Barbara Chambers   DOB: [birth year]   88 y.o. Female  MRN: [id number] Visit Date: 10/30/2022  Today's healthcare provider: Sherlyn Hay, DO   Chief Complaint  Patient presents with  . Hypertension   Subjective    HPI  Hypertension, follow-up  BP Readings from Last 3 Encounters:  10/30/22 (!) 122/51  10/13/22 108/66  05/13/22 (!) 118/52   Wt Readings from Last 3 Encounters:  10/30/22 126 lb (57.2 kg)  10/13/22 129 lb 11.2 oz (58.8 kg)  05/13/22 133 lb (60.3 kg)     She was last seen for hypertension 11 months ago.  BP at that visit was 136/60. Management since that visit includes metoprolol succinate 100 mg daily and telmisartan 80 mg daily.  She reports excellent compliance with treatment. She is not having side effects. {document side effects if present:1} She is following a Regular diet. She is exercising. She does not smoke.  Use of agents associated with hypertension: NSAIDS intermittently.   Outside blood pressures are logged, but patient did not bring it with her today.  She does report she occasionally has higher systolic readings into the 140s.  Symptoms: No chest pain No chest pressure  No palpitations No syncope  No dyspnea No orthopnea  No paroxysmal nocturnal dyspnea Yes lower extremity edema   Pertinent labs Lab Results  Component Value Date   CHOL 134 06/10/2021   HDL 63 06/10/2021   LDLCALC 58 06/10/2021   TRIG 60 06/10/2021   CHOLHDL 2.1 06/10/2021   Lab Results  Component Value Date   NA 136 08/13/2022   K 3.8 08/13/2022   CREATININE 0.91 08/13/2022   GFRNONAA >60 08/13/2022   GLUCOSE 94 08/13/2022   TSH 8.380 (H) 12/04/2021     The ASCVD Risk score (Arnett DK, et al., 2019) failed to calculate for the following reasons:   The 2019 ASCVD risk score is only valid for ages 17 to  52  ---------------------------------------------------------------------------------------------------   Medications: Outpatient Medications Prior to Visit  Medication Sig  . atorvastatin (LIPITOR) 40 MG tablet Take 40 mg by mouth daily.  [name] COENZYME Q-10 PO Take 200 mg by mouth daily.   . DULoxetine (CYMBALTA) 20 MG capsule TAKE 1 CAPSULE (20 MG TOTAL) BY MOUTH DAILY. PLEASE SCHEDULE OFFICE VISIT BEFORE ANY FUTURE REFILL.  . furosemide (LASIX) 20 MG tablet Take 1 tablet by mouth daily.  [name] ibrutinib (IMBRUVICA) 420 MG tablet TAKE 1 TABLET BY MOUTH DAILY.  . metoprolol succinate (TOPROL-XL) 100 MG 24 hr tablet TAKE 1 TABLET BY MOUTH EVERY DAY WITH OR IMMEDIATELY FOLLOWING A MEAL  . OLANZapine (ZYPREXA) 2.5 MG tablet TAKE 1 TABLET BY MOUTH EVERYDAY AT BEDTIME  . rivastigmine (EXELON) 1.5 MG capsule Take 1.5 mg by mouth 2 (two) times daily.  . SODIUM FLUORIDE 5000 PPM 1.1 % PSTE See admin instructions.  [name] telmisartan (MICARDIS) 40 MG tablet TAKE 2 TABLETS BY MOUTH EVERY DAY  . vitamin B-12 (CYANOCOBALAMIN) 1000 MCG tablet Take 1,000 mcg by mouth daily.  . [DISCONTINUED] furosemide (LASIX) 20 MG tablet Take 20 mg by mouth daily.   No facility-administered medications prior to visit.    Review of Systems  Constitutional:  Negative for chills and fever.       Occasional poor appetite, not persistent  HENT:  Negative for ear pain, sinus pressure, sinus pain, sneezing and sore throat.   Eyes:  Negative for visual disturbance.  Respiratory:  Negative  for shortness of breath.   Cardiovascular:  Negative for leg swelling.  Gastrointestinal:  Negative for abdominal pain, constipation, diarrhea and nausea.  Genitourinary:  Negative for frequency, hematuria and urgency.  Musculoskeletal:        Occasionally right hip pain upon waking but it improves shortly after she gets up.  Skin:  Negative for rash.  Neurological:  Negative for dizziness and headaches.    {Labs  Heme  Chem  Endocrine   Serology  Results Review (optional):23779}   Objective    BP (!) 122/51 (BP Location: Right Arm, Patient Position: Sitting, Cuff Size: Normal)   Pulse (!) 58   Temp 98.7 F (37.1 C) (Oral)   Wt 126 lb (57.2 kg)   SpO2 98%   BMI 23.81 kg/m  {Show previous vital signs (optional):23777}  Physical Exam Vitals reviewed.  Constitutional:      General: She is not in acute distress.    Appearance: Normal appearance. She is well-developed and normal weight. She is not diaphoretic.  HENT:     Head: Normocephalic and atraumatic.  Eyes:     General: No scleral icterus.    Conjunctiva/sclera: Conjunctivae normal.  Neck:     Thyroid: No thyromegaly.  Cardiovascular:     Rate and Rhythm: Normal rate and regular rhythm.     Pulses: Normal pulses.     Heart sounds: Normal heart sounds. No murmur heard. Pulmonary:     Effort: Pulmonary effort is normal. No respiratory distress.     Breath sounds: Normal breath sounds. No wheezing, rhonchi or rales.  Abdominal:     General: Bowel sounds are normal. There is no distension.     Palpations: Abdomen is soft.     Tenderness: There is no abdominal tenderness.  Musculoskeletal:     Cervical back: Neck supple.     Right lower leg: Edema present.     Left lower leg: Edema present.  Lymphadenopathy:     Cervical: No cervical adenopathy.  Skin:    General: Skin is warm and dry.     Findings: Lesion present. No rash.          Comments: Approx. 2 cm diameter raised, slightly red lesion with irregular contour.  Neurological:     Mental Status: She is alert and oriented to person, place, and time. Mental status is at baseline.  Psychiatric:        Mood and Affect: Mood normal.        Behavior: Behavior normal.     No results found for any visits on 10/30/22.  Assessment & Plan     Problem List Items Addressed This Visit     CLL (chronic lymphocytic leukemia) (HCC)    Ordered CBC with differential to be obtained in 2 weeks, so patient is  at 24-month mark from prior labs.      Relevant Orders   CBC w/Diff/Platelet   HLD (hyperlipidemia)    Lipid panel ordered as noted above.      Benign essential HTN - Primary    Patient's blood pressure is within expected limits, though she does endorse occasional elevated readings at home.  Encouraged patient to bring her home log with her at her next visit.  No changes to medications today.  Continue with metoprolol succinate 100 mg daily and telmisartan 80 mg daily.  Ordered CMP and lipid panel for patient to obtain in 2 weeks, so that she is at 51-month mark for oncology blood work needs.  Relevant Orders   Comprehensive metabolic panel   Lipid Profile   Senile purpura (HCC)   Vitamin B12 deficiency   Major depressive disorder, recurrent episode, moderate (HCC)    She endorses that her mood is stable on her duloxetine and that she feels that she has been doing well overall.  No changes to her medication at this time; will continue duloxetine 20 mg daily.      Other Visit Diagnoses     Lesion of face       Relevant Orders   Ambulatory referral to Dermatology        Return in about 6 months (around 05/02/2023) for chronic disease fu.      The entirety of the information documented in the History of Present Illness, Review of Systems and Physical Exam were personally obtained by me. Portions of this information were initially documented by the CMA, Adline Peals, and reviewed by me for thoroughness and accuracy.     Sherlyn Hay, DO  [facility] [phone] (phone) [phone] (fax)  [facility]

## 2022-10-30 NOTE — Assessment & Plan Note (Addendum)
Patient's blood pressure is within expected limits, though she does endorse occasional elevated readings at home.  Encouraged patient to bring her home log with her at her next visit.  No changes to medications today.  Continue with metoprolol succinate 100 mg daily and telmisartan 80 mg daily.  Ordered CMP and lipid panel for patient to obtain in 2 weeks, so that she is at 37-month mark for oncology blood work needs.

## 2022-10-30 NOTE — Assessment & Plan Note (Signed)
Lipid panel ordered as noted above.

## 2022-10-30 NOTE — Assessment & Plan Note (Addendum)
She endorses that her mood is stable on her duloxetine and that she feels that she has been doing well overall.  No changes to her medication at this time; will continue duloxetine 20 mg daily.

## 2022-10-31 ENCOUNTER — Encounter: Payer: Self-pay | Admitting: Family Medicine

## 2022-11-09 ENCOUNTER — Ambulatory Visit
Admission: RE | Admit: 2022-11-09 | Discharge: 2022-11-09 | Disposition: A | Discharge status: Home / Self Care | Payer: PPO | Source: Ambulatory Visit | Attending: Oncology | Admitting: Oncology

## 2022-11-09 DIAGNOSIS — C9110 Chronic lymphocytic leukemia of B-cell type not having achieved remission: Secondary | ICD-10-CM | POA: Insufficient documentation

## 2022-11-09 LAB — POCT I-STAT CREATININE: Creatinine, Ser: 0.9 mg/dL (ref 0.44–1.00)

## 2022-11-09 MED ORDER — IOHEXOL 300 MG/ML  SOLN
80.0000 mL | Freq: Once | INTRAMUSCULAR | Status: AC | PRN
  Administered 2022-11-09: 80 mL via INTRAVENOUS

## 2022-11-11 DIAGNOSIS — I10 Essential (primary) hypertension: Secondary | ICD-10-CM | POA: Diagnosis not present

## 2022-11-11 DIAGNOSIS — C9110 Chronic lymphocytic leukemia of B-cell type not having achieved remission: Secondary | ICD-10-CM | POA: Diagnosis not present

## 2022-11-12 LAB — LIPID PANEL
Chol/HDL Ratio: 1.9 ratio (ref 0.0–4.4)
Cholesterol, Total: 130 mg/dL (ref 100–199)
HDL: 67 mg/dL (ref >39)
LDL Chol Calc (NIH): 49 mg/dL (ref 0–99)
Triglycerides: 65 mg/dL (ref 0–149)
VLDL Cholesterol Cal: 14 mg/dL (ref 5–40)

## 2022-11-12 LAB — COMPREHENSIVE METABOLIC PANEL
ALT: 15 IU/L (ref 0–32)
AST: 23 IU/L (ref 0–40)
Albumin/Globulin Ratio: 2 (ref 1.2–2.2)
Albumin: 3.8 g/dL (ref 3.7–4.7)
Alkaline Phosphatase: 109 IU/L (ref 44–121)
BUN/Creatinine Ratio: 18 (ref 12–28)
BUN: 17 mg/dL (ref 8–27)
Bilirubin Total: 0.7 mg/dL (ref 0.0–1.2)
CO2: 22 mmol/L (ref 20–29)
Calcium: 8.7 mg/dL (ref 8.7–10.3)
Chloride: 104 mmol/L (ref 96–106)
Creatinine, Ser: 0.93 mg/dL (ref 0.57–1.00)
Globulin, Total: 1.9 g/dL (ref 1.5–4.5)
Glucose: 87 mg/dL (ref 70–99)
Potassium: 3.7 mmol/L (ref 3.5–5.2)
Sodium: 143 mmol/L (ref 134–144)
Total Protein: 5.7 g/dL — ABNORMAL LOW (ref 6.0–8.5)
eGFR: 60 mL/min/{1.73_m2} (ref >59)

## 2022-11-12 LAB — CBC WITH DIFFERENTIAL/PLATELET
Basophils Absolute: 0.1 10*3/uL (ref 0.0–0.2)
Basos: 0 %
EOS (ABSOLUTE): 0.1 10*3/uL (ref 0.0–0.4)
Eos: 0 %
Hematocrit: 30.8 % — ABNORMAL LOW (ref 34.0–46.6)
Hemoglobin: 8.8 g/dL — ABNORMAL LOW (ref 11.1–15.9)
Immature Grans (Abs): 0.1 10*3/uL (ref 0.0–0.1)
Immature Granulocytes: 1 %
Lymphocytes Absolute: 5.7 10*3/uL — ABNORMAL HIGH (ref 0.7–3.1)
Lymphs: 43 %
MCH: 18.8 pg — ABNORMAL LOW (ref 26.6–33.0)
MCHC: 28.6 g/dL — ABNORMAL LOW (ref 31.5–35.7)
MCV: 66 fL — ABNORMAL LOW (ref 79–97)
Monocytes Absolute: 1.1 10*3/uL — ABNORMAL HIGH (ref 0.1–0.9)
Monocytes: 8 %
Neutrophils Absolute: 6.4 10*3/uL (ref 1.4–7.0)
Neutrophils: 48 %
Platelets: 271 10*3/uL (ref 150–450)
RBC: 4.67 x10E6/uL (ref 3.77–5.28)
RDW: 17 % — ABNORMAL HIGH (ref 11.7–15.4)
WBC: 13.4 10*3/uL — ABNORMAL HIGH (ref 3.4–10.8)

## 2022-11-13 ENCOUNTER — Other Ambulatory Visit: Payer: Self-pay | Admitting: *Deleted

## 2022-11-13 DIAGNOSIS — C9110 Chronic lymphocytic leukemia of B-cell type not having achieved remission: Secondary | ICD-10-CM

## 2022-11-16 ENCOUNTER — Inpatient Hospital Stay (HOSPITAL_BASED_OUTPATIENT_CLINIC_OR_DEPARTMENT_OTHER): Payer: PPO | Admitting: Oncology

## 2022-11-16 ENCOUNTER — Inpatient Hospital Stay: Payer: PPO | Attending: Oncology

## 2022-11-16 ENCOUNTER — Encounter: Payer: Self-pay | Admitting: Oncology

## 2022-11-16 VITALS — BP 151/45 | HR 56 | Temp 96.5°F | Resp 14 | Ht 61.0 in | Wt 127.0 lb

## 2022-11-16 DIAGNOSIS — C9110 Chronic lymphocytic leukemia of B-cell type not having achieved remission: Secondary | ICD-10-CM

## 2022-11-16 DIAGNOSIS — D649 Anemia, unspecified: Secondary | ICD-10-CM | POA: Diagnosis not present

## 2022-11-16 DIAGNOSIS — E876 Hypokalemia: Secondary | ICD-10-CM | POA: Diagnosis not present

## 2022-11-16 LAB — CMP (CANCER CENTER ONLY)
ALT: 12 U/L (ref 0–44)
AST: 25 U/L (ref 15–41)
Albumin: 3.5 g/dL (ref 3.5–5.0)
Alkaline Phosphatase: 89 U/L (ref 38–126)
Anion gap: 13 (ref 5–15)
BUN: 21 mg/dL (ref 8–23)
CO2: 26 mmol/L (ref 22–32)
Calcium: 8.2 mg/dL — ABNORMAL LOW (ref 8.9–10.3)
Chloride: 101 mmol/L (ref 98–111)
Creatinine: 1.06 mg/dL — ABNORMAL HIGH (ref 0.44–1.00)
GFR, Estimated: 51 mL/min — ABNORMAL LOW (ref >60)
Glucose, Bld: 153 mg/dL — ABNORMAL HIGH (ref 70–99)
Potassium: 3 mmol/L — ABNORMAL LOW (ref 3.5–5.1)
Sodium: 140 mmol/L (ref 135–145)
Total Bilirubin: 0.9 mg/dL (ref 0.3–1.2)
Total Protein: 6.3 g/dL — ABNORMAL LOW (ref 6.5–8.1)

## 2022-11-16 LAB — CBC WITH DIFFERENTIAL/PLATELET
Abs Immature Granulocytes: 0.08 10*3/uL — ABNORMAL HIGH (ref 0.00–0.07)
Basophils Absolute: 0.1 10*3/uL (ref 0.0–0.1)
Basophils Relative: 0 %
Eosinophils Absolute: 0.1 10*3/uL (ref 0.0–0.5)
Eosinophils Relative: 0 %
HCT: 33.8 % — ABNORMAL LOW (ref 36.0–46.0)
Hemoglobin: 9.5 g/dL — ABNORMAL LOW (ref 12.0–15.0)
Immature Granulocytes: 1 %
Lymphocytes Relative: 33 %
Lymphs Abs: 4.8 10*3/uL — ABNORMAL HIGH (ref 0.7–4.0)
MCH: 18.8 pg — ABNORMAL LOW (ref 26.0–34.0)
MCHC: 28.1 g/dL — ABNORMAL LOW (ref 30.0–36.0)
MCV: 66.8 fL — ABNORMAL LOW (ref 80.0–100.0)
Monocytes Absolute: 1 10*3/uL (ref 0.1–1.0)
Monocytes Relative: 7 %
Neutro Abs: 8.5 10*3/uL — ABNORMAL HIGH (ref 1.7–7.7)
Neutrophils Relative %: 59 %
Platelets: 318 10*3/uL (ref 150–400)
RBC: 5.06 MIL/uL (ref 3.87–5.11)
RDW: 18.5 % — ABNORMAL HIGH (ref 11.5–15.5)
WBC: 14.5 10*3/uL — ABNORMAL HIGH (ref 4.0–10.5)
nRBC: 0 % (ref 0.0–0.2)

## 2022-11-16 NOTE — Progress Notes (Signed)
[facility]  [phone]  [phone]     Barbara Chambers DOB: February 01, 1935  MR#: [id number]  AOZ#:[id number]  Patient Care Team: Erasmo Downer, MD as PCP - General (Family Medicine) Jeralyn Ruths, MD as Consulting Physician (Oncology) Lamar Blinks, MD as Consulting Physician (Cardiology) Galen Manila, MD as Referring Physician (Ophthalmology) Juanell Fairly, RN as Case Manager Gaspar Cola, [facility] (Pharmacist)   CHIEF COMPLAINT: CLL.  INTERVAL HISTORY: Patient returns to clinic today for repeat laboratory work and routine 9-month evaluation.  She currently feels well and is asymptomatic.  She continues to tolerate Imbruvica without significant side effects.  She does not complain of any weakness or fatigue today.  She has no neurologic complaints. She denies any fevers, night sweats, or weight loss. She denies any chest pain, shortness of breath, cough, or hemoptysis.  She denies any nausea, vomiting, constipation, or diarrhea. She has no urinary complaints.  Patient offers no specific complaints today.  REVIEW OF SYSTEMS:   Review of Systems  Constitutional: Negative.  Negative for diaphoresis, fever, malaise/fatigue and weight loss.  Respiratory: Negative.  Negative for cough and shortness of breath.   Cardiovascular: Negative.  Negative for chest pain and leg swelling.  Gastrointestinal: Negative.  Negative for abdominal pain.  Genitourinary: Negative.  Negative for dysuria.  Musculoskeletal: Negative.  Negative for back pain.  Skin: Negative.  Negative for rash.  Neurological: Negative.  Negative for dizziness, sensory change, focal weakness, weakness and headaches.  Psychiatric/Behavioral: Negative.  Negative for depression and memory loss. The patient is not nervous/anxious.     As per HPI. Otherwise, a complete review of systems is negative.  PAST MEDICAL HISTORY: Past Medical History:  Diagnosis Date   Chronic  lymphocytic leukemia (HCC) 2011   Hyperlipidemia    Hypertension     PAST SURGICAL HISTORY: Past Surgical History:  Procedure Laterality Date   cataract surgery Bilateral 2005   CHOLECYSTECTOMY  1991   RECONSTRUCTION OF EYELID      FAMILY HISTORY Family History  Problem Relation Age of Onset   Hypertension Mother    Stroke Mother    CAD Mother    Hypertension Father    Heart attack Father    Hypertension Brother    Seizures Brother    Hypertension Son    Breast cancer Neg Hx     GYNECOLOGIC HISTORY:  No LMP recorded. Patient is postmenopausal.     ADVANCED DIRECTIVES:    HEALTH MAINTENANCE: Social History   Tobacco Use   Smoking status: Never   Smokeless tobacco: Never  Vaping Use   Vaping Use: Never used  Substance Use Topics   Alcohol use: No   Drug use: No     Colonoscopy:  PAP:  Bone density:  Lipid panel:  Allergies  Allergen Reactions   Hctz [Hydrochlorothiazide]     Hyponatremia   Penicillins Rash    Current Outpatient Medications  Medication Sig Dispense Refill   atorvastatin (LIPITOR) 40 MG tablet Take 40 mg by mouth daily.     COENZYME Q-10 PO Take 200 mg by mouth daily.      DULoxetine (CYMBALTA) 20 MG capsule TAKE 1 CAPSULE (20 MG TOTAL) BY MOUTH DAILY. PLEASE SCHEDULE OFFICE VISIT BEFORE ANY FUTURE REFILL. 90 capsule 0   furosemide (LASIX) 20 MG tablet Take 1 tablet by mouth daily.     ibrutinib (IMBRUVICA) 420 MG tablet TAKE 1 TABLET BY MOUTH DAILY. 28 tablet 5   metoprolol succinate (  TOPROL-XL) 100 MG 24 hr tablet TAKE 1 TABLET BY MOUTH EVERY DAY WITH OR IMMEDIATELY FOLLOWING A MEAL 90 tablet 1   OLANZapine (ZYPREXA) 2.5 MG tablet TAKE 1 TABLET BY MOUTH EVERYDAY AT BEDTIME 90 tablet 1   rivastigmine (EXELON) 1.5 MG capsule Take 1.5 mg by mouth 2 (two) times daily.     SODIUM FLUORIDE 5000 PPM 1.1 % PSTE See admin instructions.     telmisartan (MICARDIS) 40 MG tablet TAKE 2 TABLETS BY MOUTH EVERY DAY 180 tablet 0   vitamin B-12  (CYANOCOBALAMIN) 1000 MCG tablet Take 1,000 mcg by mouth daily.     No current facility-administered medications for this visit.    OBJECTIVE: BP (!) 151/45 (BP Location: Left Arm, Patient Position: Sitting, Cuff Size: Normal)   Pulse (!) 56   Temp (!) 96.5 F (35.8 C) (Tympanic)   Resp 14   Ht 5\' 1"  (1.549 m)   Wt 127 lb (57.6 kg)   SpO2 99%   BMI 24.00 kg/m    Body mass index is 24 kg/m.    ECOG FS:0 - Asymptomatic   General: Well-developed, well-nourished, no acute distress. Eyes: Pink conjunctiva, anicteric sclera. HEENT: Normocephalic, moist mucous membranes. Lungs: No audible wheezing or coughing. Heart: Regular rate and rhythm. Abdomen: Soft, nontender, no obvious distention. Musculoskeletal: No edema, cyanosis, or clubbing. Neuro: Alert, answering all questions appropriately. Cranial nerves grossly intact. Skin: No rashes or petechiae noted. Psych: Normal affect.  LAB RESULTS:  Appointment on 11/16/2022  Component Date Value Ref Range Status   Sodium 11/16/2022 140  135 - 145 mmol/L Final   Potassium 11/16/2022 3.0 (L)  3.5 - 5.1 mmol/L Final   Chloride 11/16/2022 101  98 - 111 mmol/L Final   CO2 11/16/2022 26  22 - 32 mmol/L Final   Glucose, Bld 11/16/2022 153 (H)  70 - 99 mg/dL Final   Glucose reference range applies only to samples taken after fasting for at least 8 hours.   BUN 11/16/2022 21  8 - 23 mg/dL Final   Creatinine 16/03/9603 1.06 (H)  0.44 - 1.00 mg/dL Final   Calcium 54/02/8118 8.2 (L)  8.9 - 10.3 mg/dL Final   Total Protein 14/78/2956 6.3 (L)  6.5 - 8.1 g/dL Final   Albumin 21/30/8657 3.5  3.5 - 5.0 g/dL Final   AST 84/69/6295 25  15 - 41 U/L Final   ALT 11/16/2022 12  0 - 44 U/L Final   Alkaline Phosphatase 11/16/2022 89  38 - 126 U/L Final   Total Bilirubin 11/16/2022 0.9  0.3 - 1.2 mg/dL Final   GFR, Estimated 11/16/2022 51 (L)  >60 mL/min Final   Comment: (NOTE) Calculated using the CKD-EPI Creatinine Equation (2021)    Anion gap  11/16/2022 13  5 - 15 Final   Performed at [facility], [address] [place], [place] [address]   WBC 11/16/2022 14.5 (H)  4.0 - 10.5 K/uL Final   RBC 11/16/2022 5.06  3.87 - 5.11 MIL/uL Final   Hemoglobin 11/16/2022 9.5 (L)  12.0 - 15.0 g/dL Final   Comment: Reticulocyte Hemoglobin testing may be clinically indicated, consider ordering this additional test [id number]    HCT 11/16/2022 33.8 (L)  36.0 - 46.0 % Final   MCV 11/16/2022 66.8 (L)  80.0 - 100.0 fL Final   MCH 11/16/2022 18.8 (L)  26.0 - 34.0 pg Final   MCHC 11/16/2022 28.1 (L)  30.0 - 36.0 g/dL Final   RDW 25/36/6440 18.5 (H)  11.5 -  15.5 % Final   Platelets 11/16/2022 318  150 - 400 K/uL Final   nRBC 11/16/2022 0.0  0.0 - 0.2 % Final   Neutrophils Relative % 11/16/2022 59  % Final   Neutro Abs 11/16/2022 8.5 (H)  1.7 - 7.7 K/uL Final   Lymphocytes Relative 11/16/2022 33  % Final   Lymphs Abs 11/16/2022 4.8 (H)  0.7 - 4.0 K/uL Final   Monocytes Relative 11/16/2022 7  % Final   Monocytes Absolute 11/16/2022 1.0  0.1 - 1.0 K/uL Final   Eosinophils Relative 11/16/2022 0  % Final   Eosinophils Absolute 11/16/2022 0.1  0.0 - 0.5 K/uL Final   Basophils Relative 11/16/2022 0  % Final   Basophils Absolute 11/16/2022 0.1  0.0 - 0.1 K/uL Final   Immature Granulocytes 11/16/2022 1  % Final   Abs Immature Granulocytes 11/16/2022 0.08 (H)  0.00 - 0.07 K/uL Final   Performed at [facility], [address] [place], [place] [address]    STUDIES: No results found.   ASSESSMENT: CLL.  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  Patient initiated Imbruvica in May 2021.  Her total white blood cell count remains mildly elevated ranging between 11.2 and 14.5 since June 2022.  Her most recent result was 14.5.  Her most recent imaging on Nov 09, 2022 reviewed independently and report as above with no significant lymphadenopathy noted.  No further  imaging is necessary unless there is suspicion of progression of disease.  Return to clinic in 3 months for laboratory work only and then in 6 months for laboratory work and further evaluation.   Anemia: Chronic and unchanged.  Patient's most recent hemoglobin is 9.5. Hypokalemia: Recommend dietary changes.   Patient expressed understanding and was in agreement with this plan. She also understands that She can call clinic at any time with any questions, concerns, or complaints.    Jeralyn Ruths, MD   11/16/2022 11:37 AM

## 2022-11-18 DIAGNOSIS — C44329 Squamous cell carcinoma of skin of other parts of face: Secondary | ICD-10-CM | POA: Diagnosis not present

## 2022-11-18 DIAGNOSIS — R600 Localized edema: Secondary | ICD-10-CM | POA: Diagnosis not present

## 2022-11-18 DIAGNOSIS — L578 Other skin changes due to chronic exposure to nonionizing radiation: Secondary | ICD-10-CM | POA: Diagnosis not present

## 2022-11-18 DIAGNOSIS — L570 Actinic keratosis: Secondary | ICD-10-CM | POA: Diagnosis not present

## 2022-11-18 DIAGNOSIS — L97319 Non-pressure chronic ulcer of right ankle with unspecified severity: Secondary | ICD-10-CM | POA: Diagnosis not present

## 2022-11-18 DIAGNOSIS — X32XXXA Exposure to sunlight, initial encounter: Secondary | ICD-10-CM | POA: Diagnosis not present

## 2022-11-18 DIAGNOSIS — R4189 Other symptoms and signs involving cognitive functions and awareness: Secondary | ICD-10-CM | POA: Diagnosis not present

## 2022-11-18 DIAGNOSIS — D485 Neoplasm of uncertain behavior of skin: Secondary | ICD-10-CM | POA: Diagnosis not present

## 2022-11-26 DIAGNOSIS — I700 Atherosclerosis of aorta: Secondary | ICD-10-CM | POA: Diagnosis not present

## 2022-11-26 DIAGNOSIS — I10 Essential (primary) hypertension: Secondary | ICD-10-CM | POA: Diagnosis not present

## 2022-11-26 DIAGNOSIS — R0602 Shortness of breath: Secondary | ICD-10-CM | POA: Diagnosis not present

## 2022-11-26 DIAGNOSIS — E782 Mixed hyperlipidemia: Secondary | ICD-10-CM | POA: Diagnosis not present

## 2022-11-26 DIAGNOSIS — E876 Hypokalemia: Secondary | ICD-10-CM | POA: Diagnosis not present

## 2022-11-26 DIAGNOSIS — R600 Localized edema: Secondary | ICD-10-CM | POA: Diagnosis not present

## 2022-11-30 ENCOUNTER — Encounter: Payer: PPO | Attending: Physician Assistant | Admitting: Physician Assistant

## 2022-12-01 ENCOUNTER — Other Ambulatory Visit: Payer: Self-pay | Admitting: Family Medicine

## 2022-12-01 DIAGNOSIS — I10 Essential (primary) hypertension: Secondary | ICD-10-CM

## 2022-12-09 DIAGNOSIS — R0602 Shortness of breath: Secondary | ICD-10-CM | POA: Diagnosis not present

## 2022-12-18 DIAGNOSIS — E876 Hypokalemia: Secondary | ICD-10-CM | POA: Diagnosis not present

## 2022-12-18 DIAGNOSIS — R600 Localized edema: Secondary | ICD-10-CM | POA: Diagnosis not present

## 2023-01-07 DIAGNOSIS — E782 Mixed hyperlipidemia: Secondary | ICD-10-CM | POA: Diagnosis not present

## 2023-01-07 DIAGNOSIS — R0602 Shortness of breath: Secondary | ICD-10-CM | POA: Diagnosis not present

## 2023-01-07 DIAGNOSIS — I10 Essential (primary) hypertension: Secondary | ICD-10-CM | POA: Diagnosis not present

## 2023-01-07 DIAGNOSIS — R600 Localized edema: Secondary | ICD-10-CM | POA: Diagnosis not present

## 2023-01-07 DIAGNOSIS — E876 Hypokalemia: Secondary | ICD-10-CM | POA: Diagnosis not present

## 2023-01-09 ENCOUNTER — Other Ambulatory Visit: Payer: Self-pay | Admitting: Family Medicine

## 2023-01-09 DIAGNOSIS — I10 Essential (primary) hypertension: Secondary | ICD-10-CM

## 2023-01-11 DIAGNOSIS — L814 Other melanin hyperpigmentation: Secondary | ICD-10-CM | POA: Diagnosis not present

## 2023-01-11 DIAGNOSIS — L988 Other specified disorders of the skin and subcutaneous tissue: Secondary | ICD-10-CM | POA: Diagnosis not present

## 2023-01-11 DIAGNOSIS — L578 Other skin changes due to chronic exposure to nonionizing radiation: Secondary | ICD-10-CM | POA: Diagnosis not present

## 2023-01-11 DIAGNOSIS — C44329 Squamous cell carcinoma of skin of other parts of face: Secondary | ICD-10-CM | POA: Diagnosis not present

## 2023-01-11 NOTE — Telephone Encounter (Signed)
Requested Prescriptions  Pending Prescriptions Disp Refills   telmisartan (MICARDIS) 40 MG tablet [Pharmacy Med Name: TELMISARTAN 40 MG TABLET] 180 tablet 0    Sig: TAKE 2 TABLETS BY MOUTH EVERY DAY     Cardiovascular:  Angiotensin Receptor Blockers Failed - 01/09/2023  9:12 AM      Failed - Cr in normal range and within 180 days    Creatinine  Date Value Ref Range Status  11/16/2022 1.06 (H) 0.44 - 1.00 mg/dL Final   Creat  Date Value Ref Range Status  04/20/2017 0.78 0.60 - 0.88 mg/dL Final    Comment:    For patients >39 years of age, the reference limit for Creatinine is approximately 13% higher for people identified as African-American. .          Failed - K in normal range and within 180 days    Potassium  Date Value Ref Range Status  11/16/2022 3.0 (L) 3.5 - 5.1 mmol/L Final         Failed - Last BP in normal range    BP Readings from Last 1 Encounters:  11/16/22 (!) 151/45         Passed - Patient is not pregnant      Passed - Valid encounter within last 6 months    Recent Outpatient Visits           2 months ago Benign essential HTN   [place] [facility] [place], Monico Blitz, DO   1 year ago Encounter for annual physical exam   [place] [facility] [place], Marzella Schlein, MD   1 year ago Subclinical hypothyroidism   [place] [facility] [place], Marzella Schlein, MD   1 year ago Benign essential HTN   [place] [facility] Merita Norton T, FNP   2 years ago Encounter for annual physical exam   [facility], Marzella Schlein, MD

## 2023-01-19 ENCOUNTER — Other Ambulatory Visit: Payer: Self-pay | Admitting: Family Medicine

## 2023-01-28 ENCOUNTER — Other Ambulatory Visit: Payer: Self-pay | Admitting: Pharmacist

## 2023-01-28 ENCOUNTER — Telehealth: Payer: Self-pay

## 2023-01-28 DIAGNOSIS — C9110 Chronic lymphocytic leukemia of B-cell type not having achieved remission: Secondary | ICD-10-CM

## 2023-01-28 MED ORDER — IBRUTINIB 420 MG PO TABS
420.0000 mg | ORAL_TABLET | Freq: Every day | ORAL | 5 refills | Status: DC
Start: 2023-01-28 — End: 2023-02-23

## 2023-01-28 NOTE — Telephone Encounter (Signed)
Oral Oncology Patient Advocate Encounter  Was successful in securing patient a $8,000.00 grant from [employer] to provide copayment coverage for Imbruvica.  This will keep the out of pocket expense at $0.     [employer] ID: [id number]   The billing information is as follows and has been shared with [facility].    RxBin: [id number] PCN: [id number] Member ID: [id number] Group ID: [id number] Dates of Eligibility: 12/29/22 through 12/28/23  Fund:  Chronic Lymphocytic Leukemia   Ardeen Fillers, CPhT Oncology Pharmacy Patient Advocate  [facility]  [phone] (phone) [phone] (fax) 01/28/2023 3:50 PM

## 2023-01-29 ENCOUNTER — Other Ambulatory Visit (HOSPITAL_COMMUNITY): Payer: Self-pay

## 2023-01-29 NOTE — Telephone Encounter (Signed)
Called and spoke to patient's son, Barbara Chambers. Since they have been having issues with [employer] PAP for Imbruvica, we were able to obtain a grant and they would like to switch to filling with [facility] and using grant for a $0.00 co-pay. I have set a reminder to activate patient in [place] and setup first fill through [facility] on 02/22/23. I will contact [employer] PAP at that time to cancel enrollment through them.    Ardeen Fillers, CPhT Oncology Pharmacy Patient Advocate  [facility]  [phone] (phone) [phone] (fax) 01/29/2023 9:06 AM

## 2023-02-01 ENCOUNTER — Other Ambulatory Visit (HOSPITAL_COMMUNITY): Payer: Self-pay

## 2023-02-06 ENCOUNTER — Other Ambulatory Visit: Payer: Self-pay | Admitting: Family Medicine

## 2023-02-06 DIAGNOSIS — B89 Unspecified parasitic disease: Secondary | ICD-10-CM

## 2023-02-16 ENCOUNTER — Inpatient Hospital Stay: Payer: PPO | Attending: Oncology

## 2023-02-16 DIAGNOSIS — C9110 Chronic lymphocytic leukemia of B-cell type not having achieved remission: Secondary | ICD-10-CM | POA: Diagnosis not present

## 2023-02-16 LAB — CBC WITH DIFFERENTIAL/PLATELET
Abs Immature Granulocytes: 0.04 10*3/uL (ref 0.00–0.07)
Basophils Absolute: 0 10*3/uL (ref 0.0–0.1)
Basophils Relative: 0 %
Eosinophils Absolute: 0.1 10*3/uL (ref 0.0–0.5)
Eosinophils Relative: 1 %
HCT: 29.3 % — ABNORMAL LOW (ref 36.0–46.0)
Hemoglobin: 8.6 g/dL — ABNORMAL LOW (ref 12.0–15.0)
Immature Granulocytes: 0 %
Lymphocytes Relative: 36 %
Lymphs Abs: 4.3 10*3/uL — ABNORMAL HIGH (ref 0.7–4.0)
MCH: 20.3 pg — ABNORMAL LOW (ref 26.0–34.0)
MCHC: 29.4 g/dL — ABNORMAL LOW (ref 30.0–36.0)
MCV: 69.1 fL — ABNORMAL LOW (ref 80.0–100.0)
Monocytes Absolute: 0.9 10*3/uL (ref 0.1–1.0)
Monocytes Relative: 8 %
Neutro Abs: 6.5 10*3/uL (ref 1.7–7.7)
Neutrophils Relative %: 55 %
Platelets: 319 10*3/uL (ref 150–400)
RBC: 4.24 MIL/uL (ref 3.87–5.11)
RDW: 21.8 % — ABNORMAL HIGH (ref 11.5–15.5)
WBC: 11.9 10*3/uL — ABNORMAL HIGH (ref 4.0–10.5)
nRBC: 0 % (ref 0.0–0.2)

## 2023-02-16 LAB — CMP (CANCER CENTER ONLY)
ALT: 13 U/L (ref 0–44)
AST: 21 U/L (ref 15–41)
Albumin: 3.2 g/dL — ABNORMAL LOW (ref 3.5–5.0)
Alkaline Phosphatase: 80 U/L (ref 38–126)
Anion gap: 5 (ref 5–15)
BUN: 38 mg/dL — ABNORMAL HIGH (ref 8–23)
CO2: 22 mmol/L (ref 22–32)
Calcium: 8.3 mg/dL — ABNORMAL LOW (ref 8.9–10.3)
Chloride: 107 mmol/L (ref 98–111)
Creatinine: 1.27 mg/dL — ABNORMAL HIGH (ref 0.44–1.00)
GFR, Estimated: 41 mL/min — ABNORMAL LOW (ref >60)
Glucose, Bld: 90 mg/dL (ref 70–99)
Potassium: 4.6 mmol/L (ref 3.5–5.1)
Sodium: 134 mmol/L — ABNORMAL LOW (ref 135–145)
Total Bilirubin: 0.6 mg/dL (ref 0.3–1.2)
Total Protein: 5.9 g/dL — ABNORMAL LOW (ref 6.5–8.1)

## 2023-02-23 ENCOUNTER — Other Ambulatory Visit (HOSPITAL_COMMUNITY): Payer: Self-pay

## 2023-02-23 ENCOUNTER — Other Ambulatory Visit: Payer: Self-pay

## 2023-02-23 ENCOUNTER — Other Ambulatory Visit: Payer: Self-pay | Admitting: Pharmacist

## 2023-02-23 DIAGNOSIS — C9110 Chronic lymphocytic leukemia of B-cell type not having achieved remission: Secondary | ICD-10-CM

## 2023-02-23 MED ORDER — IBRUTINIB 420 MG PO TABS
420.0000 mg | ORAL_TABLET | Freq: Every day | ORAL | 5 refills | Status: DC
Start: 2023-02-23 — End: 2023-08-02
  Filled 2023-02-23 (×2): qty 28, 28d supply, fill #0
  Filled 2023-03-12: qty 28, 28d supply, fill #1
  Filled 2023-04-13: qty 28, 28d supply, fill #2
  Filled 2023-05-11: qty 28, 28d supply, fill #3
  Filled 2023-06-01: qty 28, 28d supply, fill #4
  Filled 2023-07-01: qty 28, 28d supply, fill #5

## 2023-02-23 NOTE — Telephone Encounter (Addendum)
Called [employer] Ass[employer] Patient Assistance Program and cancelled enrollment through them.   Patient successfully OnBoarded to [facility]. Medication scheduled to be shipped on 02/24/23 for delivery on 02/25/23 from [facility] to patient's address. Patient also knows to call me at [phone] with any questions or concerns regarding receiving medication or if there is any unexpected change in co-pay.    Ardeen Fillers, CPhT Oncology Pharmacy Patient Advocate  [facility]  [phone] (phone) [phone] (fax) 02/23/2023 10:54 AM

## 2023-03-04 ENCOUNTER — Other Ambulatory Visit: Payer: Self-pay | Admitting: Family Medicine

## 2023-03-04 DIAGNOSIS — I10 Essential (primary) hypertension: Secondary | ICD-10-CM

## 2023-03-12 ENCOUNTER — Other Ambulatory Visit (HOSPITAL_COMMUNITY): Payer: Self-pay

## 2023-03-14 DIAGNOSIS — I890 Lymphedema, not elsewhere classified: Secondary | ICD-10-CM | POA: Insufficient documentation

## 2023-03-14 NOTE — Progress Notes (Signed)
MRN : [id number]  Barbara Chambers is a 88 y.o. (1935-03-17) female who presents with chief complaint of legs swell.  History of Present Illness:   Patient is seen for evaluation of leg swelling. The patient first noticed the swelling remotely but is now concerned because of a significant increase in the overall edema. The swelling isn't associated with significant pain.  There has been an increasing amount of  discoloration noted by the patient. The patient notes that in the morning the legs are improved but they steadily worsened throughout the course of the day. Elevation seems to make the swelling of the legs better, dependency makes them much worse.   There is no history of ulcerations associated with the swelling.   The patient denies any recent changes in their medications.  The patient has not been wearing graduated compression.  The patient has no had any past angiography, interventions or vascular surgery.  The patient denies a history of DVT or PE. There is no prior history of phlebitis. There is no history of primary lymphedema.  There is no history of radiation treatment to the groin or pelvis No history of malignancies. No history of trauma or groin or pelvic surgery. No history of foreign travel or parasitic infections area   No outpatient medications have been marked as taking for the 03/15/23 encounter (Appointment) with Gilda Crease, Latina Craver, MD.    Past Medical History:  Diagnosis Date   Chronic lymphocytic leukemia (HCC) 2011   Hyperlipidemia    Hypertension     Past Surgical History:  Procedure Laterality Date   cataract surgery Bilateral 2005   CHOLECYSTECTOMY  1991   RECONSTRUCTION OF EYELID      Social History Social History   Tobacco Use   Smoking status: Never   Smokeless tobacco: Never  Vaping Use   Vaping status: Never Used  Substance Use Topics   Alcohol use: No   Drug use: No    Family History Family  History  Problem Relation Age of Onset   Hypertension Mother    Stroke Mother    CAD Mother    Hypertension Father    Heart attack Father    Hypertension Brother    Seizures Brother    Hypertension Son    Breast cancer Neg Hx     Allergies  Allergen Reactions   Hctz [Hydrochlorothiazide]     Hyponatremia   Penicillins Rash     REVIEW OF SYSTEMS (Negative unless checked)  Constitutional: [] Weight loss  [] Fever  [] Chills Cardiac: [] Chest pain   [] Chest pressure   [] Palpitations   [] Shortness of breath when laying flat   [] Shortness of breath with exertion. Vascular:  [] Pain in legs with walking   [x] Pain in legs with standing  [] History of DVT   [] Phlebitis   [x] Swelling in legs   [] Varicose veins   [] Non-healing ulcers Pulmonary:   [] Uses home oxygen   [] Productive cough   [] Hemoptysis   [] Wheeze  [] COPD   [] Asthma Neurologic:  [] Dizziness   [] Seizures   [] History of stroke   [] History of TIA  [] Aphasia   [] Vissual changes   [] Weakness or numbness in arm   [] Weakness or numbness in leg Musculoskeletal:   [] Joint swelling   [x] Joint pain   [] Low back pain Hematologic:  [] Easy bruising  [] Easy bleeding   []   Hypercoagulable state   [] Anemic Gastrointestinal:  [] Diarrhea   [] Vomiting  [] Gastroesophageal reflux/heartburn   [] Difficulty swallowing. Genitourinary:  [] Chronic kidney disease   [] Difficult urination  [] Frequent urination   [] Blood in urine Skin:  [] Rashes   [] Ulcers  Psychological:  [] History of anxiety   []  History of major depression.  Physical Examination  There were no vitals filed for this visit. There is no height or weight on file to calculate BMI. Gen: WD/WN, NAD Head: [place]/AT, No temporalis wasting.  Ear/Nose/Throat: Hearing grossly intact, nares w/o erythema or drainage, pinna without lesions Eyes: PER, EOMI, sclera nonicteric.  Neck: Supple, no gross masses.  No JVD.  Pulmonary:  Good air movement, no audible wheezing, no use of accessory muscles.  Cardiac:  RRR, precordium not hyperdynamic. Vascular:  scattered varicosities present bilaterally.  Mild venous stasis changes to the legs bilaterally.  3-4+ soft pitting edema, CEAP C4sEpAsPr  Vessel Right Left  Radial Palpable Palpable  Gastrointestinal: soft, non-distended. No guarding/no peritoneal signs.  Musculoskeletal: M/S 5/5 throughout.  No deformity.  Neurologic: CN 2-12 intact. Pain and light touch intact in extremities.  Symmetrical.  Speech is fluent. Motor exam as listed above. Psychiatric: Judgment intact, Mood & affect appropriate for pt's clinical situation. Dermatologic: Venous rashes no ulcers noted.  No changes consistent with cellulitis. Lymph : No lichenification or skin changes of chronic lymphedema.  CBC Lab Results  Component Value Date   WBC 11.9 (H) 02/16/2023   HGB 8.6 (L) 02/16/2023   HCT 29.3 (L) 02/16/2023   MCV 69.1 (L) 02/16/2023   PLT 319 02/16/2023    BMET    Component Value Date/Time   NA 134 (L) 02/16/2023 0916   NA 143 11/11/2022 0950   K 4.6 02/16/2023 0916   CL 107 02/16/2023 0916   CO2 22 02/16/2023 0916   GLUCOSE 90 02/16/2023 0916   BUN 38 (H) 02/16/2023 0916   BUN 17 11/11/2022 0950   CREATININE 1.27 (H) 02/16/2023 0916   CREATININE 0.78 04/20/2017 0914   CALCIUM 8.3 (L) 02/16/2023 0916   GFRNONAA 41 (L) 02/16/2023 0916   GFRNONAA 72 04/20/2017 0914   GFRAA >60 03/07/2020 1440   GFRAA 83 04/20/2017 0914   CrCl cannot be calculated (Patient's most recent lab result is older than the maximum 21 days allowed.).  COAG Lab Results  Component Value Date   INR 1.0 10/31/2019    Radiology No results found.   Assessment/Plan 1. Lymphedema Recommend:  No surgery or intervention at this point in time.   The Patient is CEAP C4sEpAsPr.  The patient has been wearing compression for more than 12 weeks with no or little benefit.  The patient has been exercising daily for more than 12 weeks. The patient has been elevating and taking OTC  pain medications for more than 12 weeks.  None of these have have eliminated the pain related to the lymphedema or the discomfort regarding excessive swelling and venous congestion.    I have reviewed my discussion with the patient regarding lymphedema and why it  causes symptoms.  Patient will continue wearing graduated compression on a daily basis. The patient should put the compression on first thing in the morning and removing them in the evening. The patient should not sleep in the compression.   In addition, behavioral modification throughout the day will be continued.  This will include frequent elevation (such as in a recliner), use of over the counter pain medications as needed and exercise such as walking.  The systemic causes for chronic edema such as liver, kidney and cardiac etiologies do not appear to have significant changed over the past year.    The patient has chronic , severe lymphedema with hyperpigmentation of the skin and has done MLD, skin care, medication, diet, exercise, elevation and compression for 4 weeks with no improvement,  I am recommending a lymphedema pump.  The patient still has stage 3 lymphedema and therefore, I believe that a lymph pump is needed to improve the control of the patient's lymphedema and improve the quality of life.  Additionally, a lymph pump is warranted because it will reduce the risk of cellulitis and ulceration in the future.  Patient should follow-up in six months   2. Atherosclerosis of abdominal aorta (HCC) Recommend:  I do not find evidence of life style limiting vascular disease. The patient specifically denies life style limitation.  Previous noninvasive studies including ABI's of the legs do not identify critical vascular problems.  The patient should continue walking and begin a more formal exercise program. The patient should continue his antiplatelet therapy and aggressive treatment of the lipid abnormalities.  The patient is  instructed to call the office if there is a significant change in the lower extremity symptoms, particularly if a wound develops or there is an abrupt increase in leg pain.  3. Arthritis Continue NSAID medications as already ordered, these medications have been reviewed and there are no changes at this time.  Continued activity and therapy was stressed.  4. Mixed hyperlipidemia Continue statin as ordered and reviewed, no changes at this time  5. Benign essential HTN Continue antihypertensive medications as already ordered, these medications have been reviewed and there are no changes at this time.    Levora Dredge, MD  03/14/2023 3:08 PM

## 2023-03-15 ENCOUNTER — Encounter (INDEPENDENT_AMBULATORY_CARE_PROVIDER_SITE_OTHER): Payer: Self-pay | Admitting: Vascular Surgery

## 2023-03-15 ENCOUNTER — Ambulatory Visit (INDEPENDENT_AMBULATORY_CARE_PROVIDER_SITE_OTHER): Payer: PPO | Admitting: Vascular Surgery

## 2023-03-15 VITALS — BP 184/88 | HR 85 | Resp 15 | Wt 120.6 lb

## 2023-03-15 DIAGNOSIS — E782 Mixed hyperlipidemia: Secondary | ICD-10-CM | POA: Diagnosis not present

## 2023-03-15 DIAGNOSIS — M1990 Unspecified osteoarthritis, unspecified site: Secondary | ICD-10-CM | POA: Diagnosis not present

## 2023-03-15 DIAGNOSIS — I700 Atherosclerosis of aorta: Secondary | ICD-10-CM

## 2023-03-15 DIAGNOSIS — I890 Lymphedema, not elsewhere classified: Secondary | ICD-10-CM

## 2023-03-15 DIAGNOSIS — I10 Essential (primary) hypertension: Secondary | ICD-10-CM

## 2023-03-18 ENCOUNTER — Other Ambulatory Visit (HOSPITAL_COMMUNITY): Payer: Self-pay

## 2023-03-31 DIAGNOSIS — E782 Mixed hyperlipidemia: Secondary | ICD-10-CM | POA: Diagnosis not present

## 2023-03-31 DIAGNOSIS — I700 Atherosclerosis of aorta: Secondary | ICD-10-CM | POA: Diagnosis not present

## 2023-03-31 DIAGNOSIS — R600 Localized edema: Secondary | ICD-10-CM | POA: Diagnosis not present

## 2023-03-31 DIAGNOSIS — I10 Essential (primary) hypertension: Secondary | ICD-10-CM | POA: Diagnosis not present

## 2023-04-02 ENCOUNTER — Encounter (INDEPENDENT_AMBULATORY_CARE_PROVIDER_SITE_OTHER): Payer: Self-pay | Admitting: Vascular Surgery

## 2023-04-10 ENCOUNTER — Other Ambulatory Visit: Payer: Self-pay | Admitting: Family Medicine

## 2023-04-10 DIAGNOSIS — I10 Essential (primary) hypertension: Secondary | ICD-10-CM

## 2023-04-12 NOTE — Telephone Encounter (Signed)
Requested by interface surescripts. Protocol met . Requested Prescriptions  Pending Prescriptions Disp Refills   telmisartan (MICARDIS) 40 MG tablet [Pharmacy Med Name: TELMISARTAN 40 MG TABLET] 180 tablet 0    Sig: TAKE 2 TABLETS BY MOUTH EVERY DAY     Cardiovascular:  Angiotensin Receptor Blockers Failed - 04/10/2023  1:04 AM      Failed - Cr in normal range and within 180 days    Creatinine  Date Value Ref Range Status  02/16/2023 1.27 (H) 0.44 - 1.00 mg/dL Final   Creat  Date Value Ref Range Status  04/20/2017 0.78 0.60 - 0.88 mg/dL Final    Comment:    For patients >5 years of age, the reference limit for Creatinine is approximately 13% higher for people identified as African-American. .          Failed - Last BP in normal range    BP Readings from Last 1 Encounters:  03/15/23 (!) 184/88         Passed - K in normal range and within 180 days    Potassium  Date Value Ref Range Status  02/16/2023 4.6 3.5 - 5.1 mmol/L Final         Passed - Patient is not pregnant      Passed - Valid encounter within last 6 months    Recent Outpatient Visits           5 months ago Benign essential HTN   [place] [facility] [place], Monico Blitz, DO   1 year ago Encounter for annual physical exam   [place] [facility] [place], Marzella Schlein, MD   1 year ago Subclinical hypothyroidism   [place] [facility] [place], Marzella Schlein, MD   1 year ago Benign essential HTN   [place] [facility] Merita Norton T, FNP   2 years ago Encounter for annual physical exam   [place], Marzella Schlein, MD

## 2023-04-13 ENCOUNTER — Other Ambulatory Visit (HOSPITAL_COMMUNITY): Payer: Self-pay | Admitting: Pharmacy Technician

## 2023-04-13 ENCOUNTER — Other Ambulatory Visit (HOSPITAL_COMMUNITY): Payer: Self-pay

## 2023-04-13 NOTE — Progress Notes (Signed)
Specialty Pharmacy Refill Coordination Note  Barbara Chambers is a 88 y.o. female contacted today regarding refills of specialty medication(s) Ibrutinib  Spoke with Son  Patient requested Delivery   Delivery date: 04/19/23   Verified address: [address] [place], [place]   Medication will be filled on 04/16/23.

## 2023-04-14 ENCOUNTER — Other Ambulatory Visit: Payer: Self-pay | Admitting: Family Medicine

## 2023-04-14 DIAGNOSIS — I10 Essential (primary) hypertension: Secondary | ICD-10-CM

## 2023-04-14 NOTE — Telephone Encounter (Signed)
Request is too soon, last refill 04/12/23 for 90 days.  Requested Prescriptions  Pending Prescriptions Disp Refills   telmisartan (MICARDIS) 40 MG tablet [Pharmacy Med Name: TELMISARTAN 40 MG TABLET] 180 tablet 0    Sig: TAKE 2 TABLETS BY MOUTH EVERY DAY     Cardiovascular:  Angiotensin Receptor Blockers Failed - 04/14/2023  1:07 PM      Failed - Cr in normal range and within 180 days    Creatinine  Date Value Ref Range Status  02/16/2023 1.27 (H) 0.44 - 1.00 mg/dL Final   Creat  Date Value Ref Range Status  04/20/2017 0.78 0.60 - 0.88 mg/dL Final    Comment:    For patients >25 years of age, the reference limit for Creatinine is approximately 13% higher for people identified as African-American. .          Failed - Last BP in normal range    BP Readings from Last 1 Encounters:  03/15/23 (!) 184/88         Passed - K in normal range and within 180 days    Potassium  Date Value Ref Range Status  02/16/2023 4.6 3.5 - 5.1 mmol/L Final         Passed - Patient is not pregnant      Passed - Valid encounter within last 6 months    Recent Outpatient Visits           5 months ago Benign essential HTN   [place] [facility] [place], Monico Blitz, DO   1 year ago Encounter for annual physical exam   [place] [facility] [place], Marzella Schlein, MD   1 year ago Subclinical hypothyroidism   [place] [facility] [place], Marzella Schlein, MD   1 year ago Benign essential HTN   [place] [facility] Merita Norton T, FNP   2 years ago Encounter for annual physical exam   [facility], Marzella Schlein, MD

## 2023-05-02 ENCOUNTER — Other Ambulatory Visit: Payer: Self-pay | Admitting: Family Medicine

## 2023-05-11 ENCOUNTER — Other Ambulatory Visit (HOSPITAL_COMMUNITY): Payer: Self-pay

## 2023-05-11 ENCOUNTER — Other Ambulatory Visit: Payer: Self-pay

## 2023-05-11 NOTE — Progress Notes (Signed)
Specialty Pharmacy Refill Coordination Note  Barbara Chambers is a 88 y.o. female contacted today regarding refills of specialty medication(s) Ibrutinib   Patient requested Delivery   Delivery date: 05/13/23   Verified address: [address] [place] [address]   Medication will be filled on 05/12/23.

## 2023-05-12 ENCOUNTER — Other Ambulatory Visit: Payer: Self-pay

## 2023-05-20 ENCOUNTER — Inpatient Hospital Stay: Payer: PPO | Attending: Oncology

## 2023-05-20 ENCOUNTER — Encounter: Payer: Self-pay | Admitting: Oncology

## 2023-05-20 ENCOUNTER — Inpatient Hospital Stay: Payer: PPO | Admitting: Oncology

## 2023-05-20 VITALS — HR 63 | Temp 97.9°F | Resp 18 | Wt 119.8 lb

## 2023-05-20 DIAGNOSIS — D649 Anemia, unspecified: Secondary | ICD-10-CM | POA: Insufficient documentation

## 2023-05-20 DIAGNOSIS — C9110 Chronic lymphocytic leukemia of B-cell type not having achieved remission: Secondary | ICD-10-CM | POA: Diagnosis not present

## 2023-05-20 DIAGNOSIS — N289 Disorder of kidney and ureter, unspecified: Secondary | ICD-10-CM | POA: Diagnosis not present

## 2023-05-20 LAB — CMP (CANCER CENTER ONLY)
ALT: 14 U/L (ref 0–44)
AST: 24 U/L (ref 15–41)
Albumin: 3.7 g/dL (ref 3.5–5.0)
Alkaline Phosphatase: 86 U/L (ref 38–126)
Anion gap: 12 (ref 5–15)
BUN: 33 mg/dL — ABNORMAL HIGH (ref 8–23)
CO2: 25 mmol/L (ref 22–32)
Calcium: 9 mg/dL (ref 8.9–10.3)
Chloride: 104 mmol/L (ref 98–111)
Creatinine: 1.55 mg/dL — ABNORMAL HIGH (ref 0.44–1.00)
GFR, Estimated: 32 mL/min — ABNORMAL LOW (ref >60)
Glucose, Bld: 101 mg/dL — ABNORMAL HIGH (ref 70–99)
Potassium: 4.7 mmol/L (ref 3.5–5.1)
Sodium: 141 mmol/L (ref 135–145)
Total Bilirubin: 0.8 mg/dL (ref <1.2)
Total Protein: 6.6 g/dL (ref 6.5–8.1)

## 2023-05-20 LAB — CBC WITH DIFFERENTIAL/PLATELET
Abs Immature Granulocytes: 0.06 10*3/uL (ref 0.00–0.07)
Basophils Absolute: 0.1 10*3/uL (ref 0.0–0.1)
Basophils Relative: 1 %
Eosinophils Absolute: 0.1 10*3/uL (ref 0.0–0.5)
Eosinophils Relative: 1 %
HCT: 31.5 % — ABNORMAL LOW (ref 36.0–46.0)
Hemoglobin: 9.1 g/dL — ABNORMAL LOW (ref 12.0–15.0)
Immature Granulocytes: 1 %
Lymphocytes Relative: 43 %
Lymphs Abs: 5.4 10*3/uL — ABNORMAL HIGH (ref 0.7–4.0)
MCH: 20.2 pg — ABNORMAL LOW (ref 26.0–34.0)
MCHC: 28.9 g/dL — ABNORMAL LOW (ref 30.0–36.0)
MCV: 69.8 fL — ABNORMAL LOW (ref 80.0–100.0)
Monocytes Absolute: 0.9 10*3/uL (ref 0.1–1.0)
Monocytes Relative: 7 %
Neutro Abs: 6.2 10*3/uL (ref 1.7–7.7)
Neutrophils Relative %: 47 %
Platelets: 271 10*3/uL (ref 150–400)
RBC: 4.51 MIL/uL (ref 3.87–5.11)
RDW: 17.3 % — ABNORMAL HIGH (ref 11.5–15.5)
Smear Review: NORMAL
WBC: 12.8 10*3/uL — ABNORMAL HIGH (ref 4.0–10.5)
nRBC: 0 % (ref 0.0–0.2)

## 2023-05-20 NOTE — Progress Notes (Signed)
[facility]  [phone]  [phone]     Barbara Chambers DOB: [birth year]  MR#: [id number]  QMV#:[id number]  Patient Care Team: Erasmo Downer, MD as PCP - General (Family Medicine) Jeralyn Ruths, MD as Consulting Physician (Oncology) Lamar Blinks, MD as Consulting Physician (Cardiology) Galen Manila, MD as Referring Physician (Ophthalmology)   CHIEF COMPLAINT: CLL.  INTERVAL HISTORY: Patient returns to clinic today for repeat laboratory work and routine 65-month evaluation.  She continues to feel well and remains asymptomatic.  She is tolerating Imbruvica without significant side effects.  She does not complain of any weakness or fatigue. She has no neurologic complaints. She denies any fevers, night sweats, or weight loss. She denies any chest pain, shortness of breath, cough, or hemoptysis.  She denies any nausea, vomiting, constipation, or diarrhea. She has no urinary complaints.  Patient offers no specific complaints today.  REVIEW OF SYSTEMS:   Review of Systems  Constitutional: Negative.  Negative for diaphoresis, fever, malaise/fatigue and weight loss.  Respiratory: Negative.  Negative for cough and shortness of breath.   Cardiovascular: Negative.  Negative for chest pain and leg swelling.  Gastrointestinal: Negative.  Negative for abdominal pain.  Genitourinary: Negative.  Negative for dysuria.  Musculoskeletal: Negative.  Negative for back pain.  Skin: Negative.  Negative for rash.  Neurological: Negative.  Negative for dizziness, sensory change, focal weakness, weakness and headaches.  Psychiatric/Behavioral: Negative.  Negative for depression and memory loss. The patient is not nervous/anxious.     As per HPI. Otherwise, a complete review of systems is negative.  PAST MEDICAL HISTORY: Past Medical History:  Diagnosis Date   Chronic lymphocytic leukemia (HCC) 2011   Hyperlipidemia    Hypertension     PAST SURGICAL  HISTORY: Past Surgical History:  Procedure Laterality Date   cataract surgery Bilateral 2005   CHOLECYSTECTOMY  1991   RECONSTRUCTION OF EYELID      FAMILY HISTORY Family History  Problem Relation Age of Onset   Hypertension Mother    Stroke Mother    CAD Mother    Hypertension Father    Heart attack Father    Hypertension Brother    Seizures Brother    Hypertension Son    Breast cancer Neg Hx     GYNECOLOGIC HISTORY:  No LMP recorded. Patient is postmenopausal.     ADVANCED DIRECTIVES:    HEALTH MAINTENANCE: Social History   Tobacco Use   Smoking status: Never   Smokeless tobacco: Never  Vaping Use   Vaping status: Never Used  Substance Use Topics   Alcohol use: No   Drug use: No     Colonoscopy:  PAP:  Bone density:  Lipid panel:  Allergies  Allergen Reactions   Hctz [Hydrochlorothiazide]     Hyponatremia   Penicillins Rash    Current Outpatient Medications  Medication Sig Dispense Refill   atorvastatin (LIPITOR) 40 MG tablet Take 40 mg by mouth daily.     COENZYME Q-10 PO Take 200 mg by mouth daily.      DULoxetine (CYMBALTA) 20 MG capsule Take 1 capsule (20 mg total) by mouth daily. 90 capsule 0   furosemide (LASIX) 20 MG tablet Take 1 tablet by mouth daily.     ibrutinib (IMBRUVICA) 420 MG tablet TAKE 1 TABLET BY MOUTH DAILY. 28 tablet 5   metoprolol succinate (TOPROL-XL) 100 MG 24 hr tablet TAKE 1 TABLET BY MOUTH EVERY DAY WITH OR IMMEDIATELY FOLLOWING A MEAL 90 tablet  0   OLANZapine (ZYPREXA) 2.5 MG tablet TAKE 1 TABLET BY MOUTH EVERYDAY AT BEDTIME 90 tablet 1   Potassium Chloride ER 20 MEQ TBCR Take 1 tablet by mouth daily.     rivastigmine (EXELON) 1.5 MG capsule Take 1.5 mg by mouth 2 (two) times daily.     SODIUM FLUORIDE 5000 PPM 1.1 % PSTE See admin instructions.     telmisartan (MICARDIS) 40 MG tablet TAKE 2 TABLETS BY MOUTH EVERY DAY 180 tablet 0   vitamin B-12 (CYANOCOBALAMIN) 1000 MCG tablet Take 1,000 mcg by mouth daily.     No  current facility-administered medications for this visit.    OBJECTIVE: Pulse 63   Temp 97.9 F (36.6 C) (Tympanic)   Resp 18   Wt 119 lb 12.8 oz (54.3 kg)   SpO2 99%   BMI 22.64 kg/m    Body mass index is 22.64 kg/m.    ECOG FS:0 - Asymptomatic   General: Well-developed, well-nourished, no acute distress. Eyes: Pink conjunctiva, anicteric sclera. HEENT: Normocephalic, moist mucous membranes. Lungs: No audible wheezing or coughing. Heart: Regular rate and rhythm. Abdomen: Soft, nontender, no obvious distention. Musculoskeletal: No edema, cyanosis, or clubbing. Neuro: Alert, answering all questions appropriately. Cranial nerves grossly intact. Skin: No rashes or petechiae noted. Psych: Normal affect.  LAB RESULTS:  Appointment on 05/20/2023  Component Date Value Ref Range Status   Sodium 05/20/2023 141  135 - 145 mmol/L Final   Potassium 05/20/2023 4.7  3.5 - 5.1 mmol/L Final   Chloride 05/20/2023 104  98 - 111 mmol/L Final   CO2 05/20/2023 25  22 - 32 mmol/L Final   Glucose, Bld 05/20/2023 101 (H)  70 - 99 mg/dL Final   Glucose reference range applies only to samples taken after fasting for at least 8 hours.   BUN 05/20/2023 33 (H)  8 - 23 mg/dL Final   Creatinine 40/98/1191 1.55 (H)  0.44 - 1.00 mg/dL Final   Calcium 47/82/9562 9.0  8.9 - 10.3 mg/dL Final   Total Protein 13/01/6577 6.6  6.5 - 8.1 g/dL Final   Albumin 46/96/2952 3.7  3.5 - 5.0 g/dL Final   AST 84/13/2440 24  15 - 41 U/L Final   ALT 05/20/2023 14  0 - 44 U/L Final   Alkaline Phosphatase 05/20/2023 86  38 - 126 U/L Final   Total Bilirubin 05/20/2023 0.8  <1.2 mg/dL Final   GFR, Estimated 05/20/2023 32 (L)  >60 mL/min Final   Comment: (NOTE) Calculated using the CKD-EPI Creatinine Equation (2021)    Anion gap 05/20/2023 12  5 - 15 Final   Performed at [facility], [address] [place], [place] [address]   WBC 05/20/2023 12.8 (H)  4.0 - 10.5 K/uL Final   RBC 05/20/2023 4.51  3.87 - 5.11  MIL/uL Final   Hemoglobin 05/20/2023 9.1 (L)  12.0 - 15.0 g/dL Final   Comment: Reticulocyte Hemoglobin testing may be clinically indicated, consider ordering this additional test [id number]    HCT 05/20/2023 31.5 (L)  36.0 - 46.0 % Final   MCV 05/20/2023 69.8 (L)  80.0 - 100.0 fL Final   MCH 05/20/2023 20.2 (L)  26.0 - 34.0 pg Final   MCHC 05/20/2023 28.9 (L)  30.0 - 36.0 g/dL Final   RDW 47/42/5956 17.3 (H)  11.5 - 15.5 % Final   Platelets 05/20/2023 271  150 - 400 K/uL Final   nRBC 05/20/2023 0.0  0.0 - 0.2 % Final   Neutrophils Relative %  05/20/2023 47  % Final   Neutro Abs 05/20/2023 6.2  1.7 - 7.7 K/uL Final   Lymphocytes Relative 05/20/2023 43  % Final   Lymphs Abs 05/20/2023 5.4 (H)  0.7 - 4.0 K/uL Final   Monocytes Relative 05/20/2023 7  % Final   Monocytes Absolute 05/20/2023 0.9  0.1 - 1.0 K/uL Final   Eosinophils Relative 05/20/2023 1  % Final   Eosinophils Absolute 05/20/2023 0.1  0.0 - 0.5 K/uL Final   Basophils Relative 05/20/2023 1  % Final   Basophils Absolute 05/20/2023 0.1  0.0 - 0.1 K/uL Final   WBC Morphology 05/20/2023 UNREMARKABLE   Final   RBC Morphology 05/20/2023 MIXED RBC POPULATION   Final   Smear Review 05/20/2023 Normal platelet morphology   Final   PLATELETS APPEAR ADEQUATE   Immature Granulocytes 05/20/2023 1  % Final   Abs Immature Granulocytes 05/20/2023 0.06  0.00 - 0.07 K/uL Final   Performed at [facility], [address] [place], [place] [address]    STUDIES: No results found.   ASSESSMENT: CLL.  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  Patient initiated Imbruvica in May 2021.  Her total white blood cell count remains mildly elevated ranging between 11.2 and 14.5 since June 2022.  Today's result is 12.8.  Her most recent imaging on Nov 09, 2022 reviewed independently and report as above with no significant lymphadenopathy noted.  No further imaging is  necessary unless there is suspicion of progression of disease.  Continue Imbruvica as prescribed.  Return to clinic in 3 months for laboratory work only and in 6 months for laboratory work and further evaluation.   Anemia: Chronic and unchanged.  Patient's most recent hemoglobin is 9.1.  Will get iron stores with next blood draw. Renal insufficiency: Patient's creatinine has increased to 1.55.  I recommended increasing fluid intake. Hypokalemia: Resolved.  Patient expressed understanding and was in agreement with this plan. She also understands that She can call clinic at any time with any questions, concerns, or complaints.    Jeralyn Ruths, MD   05/20/2023 11:34 AM

## 2023-05-20 NOTE — Progress Notes (Signed)
Patient denies any concerns today.  

## 2023-06-01 ENCOUNTER — Other Ambulatory Visit (HOSPITAL_COMMUNITY): Payer: Self-pay | Admitting: Pharmacy Technician

## 2023-06-01 ENCOUNTER — Other Ambulatory Visit (HOSPITAL_COMMUNITY): Payer: Self-pay

## 2023-06-01 NOTE — Progress Notes (Signed)
Specialty Pharmacy Refill Coordination Note  Barbara Chambers is a 88 y.o. female contacted today regarding refills of specialty medication(s) Ibrutinib  Spoke with [name]  Patient requested Delivery   Delivery date: 06/09/23   Verified address: [address] [place]   Medication will be filled on 06/08/23.

## 2023-06-02 DIAGNOSIS — Z08 Encounter for follow-up examination after completed treatment for malignant neoplasm: Secondary | ICD-10-CM | POA: Diagnosis not present

## 2023-06-02 DIAGNOSIS — L821 Other seborrheic keratosis: Secondary | ICD-10-CM | POA: Diagnosis not present

## 2023-06-02 DIAGNOSIS — Z85828 Personal history of other malignant neoplasm of skin: Secondary | ICD-10-CM | POA: Diagnosis not present

## 2023-06-02 DIAGNOSIS — L570 Actinic keratosis: Secondary | ICD-10-CM | POA: Diagnosis not present

## 2023-06-02 DIAGNOSIS — L578 Other skin changes due to chronic exposure to nonionizing radiation: Secondary | ICD-10-CM | POA: Diagnosis not present

## 2023-06-02 DIAGNOSIS — L97319 Non-pressure chronic ulcer of right ankle with unspecified severity: Secondary | ICD-10-CM | POA: Diagnosis not present

## 2023-06-02 DIAGNOSIS — D229 Melanocytic nevi, unspecified: Secondary | ICD-10-CM | POA: Diagnosis not present

## 2023-06-02 DIAGNOSIS — X32XXXA Exposure to sunlight, initial encounter: Secondary | ICD-10-CM | POA: Diagnosis not present

## 2023-06-02 DIAGNOSIS — R600 Localized edema: Secondary | ICD-10-CM | POA: Diagnosis not present

## 2023-06-04 ENCOUNTER — Other Ambulatory Visit: Payer: Self-pay | Admitting: Family Medicine

## 2023-06-04 DIAGNOSIS — I10 Essential (primary) hypertension: Secondary | ICD-10-CM

## 2023-06-04 NOTE — Telephone Encounter (Signed)
Requested Prescriptions  Pending Prescriptions Disp Refills   metoprolol succinate (TOPROL-XL) 100 MG 24 hr tablet [Pharmacy Med Name: METOPROLOL SUCC ER 100 MG TAB] 30 tablet 0    Sig: TAKE 1 TABLET BY MOUTH EVERY DAY WITH OR IMMEDIATELY FOLLOWING A MEAL     Cardiovascular:  Beta Blockers Failed - 06/04/2023  2:52 AM      Failed - Last BP in normal range    BP Readings from Last 1 Encounters:  03/15/23 (!) 184/88         Failed - Valid encounter within last 6 months    Recent Outpatient Visits           7 months ago Benign essential HTN   [place] [facility] [place], Monico Blitz, DO   1 year ago Encounter for annual physical exam   [facility] [place], Marzella Schlein, MD   2 years ago Subclinical hypothyroidism   [facility] [place], Marzella Schlein, MD   2 years ago Benign essential HTN   [place] [facility] Merita Norton T, FNP   2 years ago Encounter for annual physical exam   [facility] [place], Marzella Schlein, MD              Passed - Last Heart Rate in normal range    Pulse Readings from Last 1 Encounters:  05/20/23 63

## 2023-06-08 ENCOUNTER — Other Ambulatory Visit: Payer: Self-pay

## 2023-07-01 ENCOUNTER — Other Ambulatory Visit (HOSPITAL_COMMUNITY): Payer: Self-pay

## 2023-07-01 NOTE — Progress Notes (Signed)
 Specialty Pharmacy Refill Coordination Note  Barbara Chambers is a 89 y.o. female contacted today regarding refills of specialty medication(s) Ibrutinib  (IMBRUVICA )   Patient requested Delivery   Delivery date: 07/14/23   Verified address: [address], [place], [place]   Medication will be filled on 07/13/23.

## 2023-07-02 ENCOUNTER — Other Ambulatory Visit: Payer: Self-pay | Admitting: Family Medicine

## 2023-07-02 DIAGNOSIS — I10 Essential (primary) hypertension: Secondary | ICD-10-CM

## 2023-07-06 NOTE — Telephone Encounter (Signed)
 Requested Prescriptions  Refused Prescriptions Disp Refills   metoprolol  succinate (TOPROL -XL) 100 MG 24 hr tablet [Pharmacy Med Name: METOPROLOL  SUCC ER 100 MG TAB] 90 tablet 1    Sig: TAKE 1 TABLET BY MOUTH EVERY DAY WITH OR IMMEDIATELY FOLLOWING A MEAL     Cardiovascular:  Beta Blockers Failed - 07/06/2023  7:57 AM      Failed - Last BP in normal range    BP Readings from Last 1 Encounters:  03/15/23 (!) 184/88         Failed - Valid encounter within last 6 months    Recent Outpatient Visits           8 months ago Benign essential HTN   [facility] [place], Lauraine SAILOR, DO   1 year ago Encounter for annual physical exam   [facility] [place], Jon HERO, MD   2 years ago Subclinical hypothyroidism   [place] [facility] [place], Jon HERO, MD   2 years ago Benign essential HTN   [place] [facility] Emilio Marseille T, FNP   2 years ago Encounter for annual physical exam   [facility] [place], Jon HERO, MD              Passed - Last Heart Rate in normal range    Pulse Readings from Last 1 Encounters:  05/20/23 63

## 2023-07-08 ENCOUNTER — Other Ambulatory Visit: Payer: Self-pay | Admitting: Family Medicine

## 2023-07-08 DIAGNOSIS — I10 Essential (primary) hypertension: Secondary | ICD-10-CM

## 2023-07-13 ENCOUNTER — Other Ambulatory Visit (HOSPITAL_COMMUNITY): Payer: Self-pay

## 2023-07-13 ENCOUNTER — Other Ambulatory Visit: Payer: Self-pay

## 2023-07-20 DIAGNOSIS — I890 Lymphedema, not elsewhere classified: Secondary | ICD-10-CM | POA: Diagnosis not present

## 2023-07-20 DIAGNOSIS — R001 Bradycardia, unspecified: Secondary | ICD-10-CM | POA: Diagnosis not present

## 2023-07-20 DIAGNOSIS — R4189 Other symptoms and signs involving cognitive functions and awareness: Secondary | ICD-10-CM | POA: Diagnosis not present

## 2023-07-20 DIAGNOSIS — R2689 Other abnormalities of gait and mobility: Secondary | ICD-10-CM | POA: Diagnosis not present

## 2023-07-24 DIAGNOSIS — Z856 Personal history of leukemia: Secondary | ICD-10-CM | POA: Diagnosis not present

## 2023-07-24 DIAGNOSIS — E785 Hyperlipidemia, unspecified: Secondary | ICD-10-CM | POA: Diagnosis not present

## 2023-07-24 DIAGNOSIS — F0390 Unspecified dementia without behavioral disturbance: Secondary | ICD-10-CM | POA: Diagnosis not present

## 2023-07-24 DIAGNOSIS — I10 Essential (primary) hypertension: Secondary | ICD-10-CM | POA: Diagnosis not present

## 2023-07-24 DIAGNOSIS — E559 Vitamin D deficiency, unspecified: Secondary | ICD-10-CM | POA: Diagnosis not present

## 2023-07-24 DIAGNOSIS — Z9181 History of falling: Secondary | ICD-10-CM | POA: Diagnosis not present

## 2023-07-29 ENCOUNTER — Other Ambulatory Visit: Payer: Self-pay | Admitting: Family Medicine

## 2023-08-02 ENCOUNTER — Other Ambulatory Visit: Payer: Self-pay

## 2023-08-02 ENCOUNTER — Other Ambulatory Visit: Payer: Self-pay | Admitting: Pharmacist

## 2023-08-02 DIAGNOSIS — C9110 Chronic lymphocytic leukemia of B-cell type not having achieved remission: Secondary | ICD-10-CM

## 2023-08-02 MED ORDER — IBRUTINIB 420 MG PO TABS
420.0000 mg | ORAL_TABLET | Freq: Every day | ORAL | 5 refills | Status: DC
Start: 2023-08-02 — End: 2024-03-28
  Filled 2023-08-02: qty 28, 28d supply, fill #0
  Filled 2023-09-03: qty 28, 28d supply, fill #1
  Filled 2023-09-27 (×2): qty 28, 28d supply, fill #2
  Filled 2023-10-26 (×2): qty 28, 28d supply, fill #3

## 2023-08-02 NOTE — Progress Notes (Signed)
Specialty Pharmacy Refill Coordination Note  Barbara Chambers is a 89 y.o. female who's son, [name] was contacted today regarding refills of specialty medication(s) Ibrutinib [name])   Patient requested Delivery   Delivery date: 08/10/23   Verified address: [address], [place], [place]   Medication will be filled on 08/09/23.

## 2023-08-02 NOTE — Progress Notes (Signed)
Specialty Pharmacy Ongoing Clinical Assessment Note  Barbara Chambers is a 89 y.o. female who is being followed by the specialty pharmacy service for RxSp Oncology   Patient's specialty medication(s) reviewed today: Ibrutinib (IMBRUVICA)   Missed doses in the last 4 weeks: 0   Patient/Caregiver did not have any additional questions or concerns.   Therapeutic benefit summary: Unable to assess   Adverse events/side effects summary: No adverse events/side effects   Patient's therapy is appropriate to: Continue    Goals Addressed             This Visit's Progress    Stabilization of disease       Patient is on track. Patient will maintain adherence         Follow up:  6 months  Bobette Mo Specialty Pharmacist

## 2023-08-04 DIAGNOSIS — Z856 Personal history of leukemia: Secondary | ICD-10-CM | POA: Diagnosis not present

## 2023-08-04 DIAGNOSIS — E559 Vitamin D deficiency, unspecified: Secondary | ICD-10-CM | POA: Diagnosis not present

## 2023-08-04 DIAGNOSIS — I10 Essential (primary) hypertension: Secondary | ICD-10-CM | POA: Diagnosis not present

## 2023-08-04 DIAGNOSIS — Z9181 History of falling: Secondary | ICD-10-CM | POA: Diagnosis not present

## 2023-08-04 DIAGNOSIS — E785 Hyperlipidemia, unspecified: Secondary | ICD-10-CM | POA: Diagnosis not present

## 2023-08-04 DIAGNOSIS — F0390 Unspecified dementia without behavioral disturbance: Secondary | ICD-10-CM | POA: Diagnosis not present

## 2023-08-09 ENCOUNTER — Other Ambulatory Visit: Payer: Self-pay

## 2023-08-10 DIAGNOSIS — E559 Vitamin D deficiency, unspecified: Secondary | ICD-10-CM | POA: Diagnosis not present

## 2023-08-10 DIAGNOSIS — Z856 Personal history of leukemia: Secondary | ICD-10-CM | POA: Diagnosis not present

## 2023-08-10 DIAGNOSIS — Z9181 History of falling: Secondary | ICD-10-CM | POA: Diagnosis not present

## 2023-08-10 DIAGNOSIS — F0390 Unspecified dementia without behavioral disturbance: Secondary | ICD-10-CM | POA: Diagnosis not present

## 2023-08-10 DIAGNOSIS — E785 Hyperlipidemia, unspecified: Secondary | ICD-10-CM | POA: Diagnosis not present

## 2023-08-10 DIAGNOSIS — I10 Essential (primary) hypertension: Secondary | ICD-10-CM | POA: Diagnosis not present

## 2023-08-14 ENCOUNTER — Other Ambulatory Visit: Payer: Self-pay | Admitting: Family Medicine

## 2023-08-14 DIAGNOSIS — B89 Unspecified parasitic disease: Secondary | ICD-10-CM

## 2023-08-16 NOTE — Telephone Encounter (Signed)
 Requested medication (s) are due for refill today - yes  Requested medication (s) are on the active medication list -yes  Future visit scheduled -no  Last refill: 02/08/23 #90 1RF  Notes to clinic: non delegated Rx  Requested Prescriptions  Pending Prescriptions Disp Refills   OLANZapine (ZYPREXA) 2.5 MG tablet [Pharmacy Med Name: OLANZAPINE 2.5 MG TABLET] 90 tablet 1    Sig: TAKE 1 TABLET BY MOUTH EVERYDAY AT BEDTIME     Not Delegated - Psychiatry:  Antipsychotics - Second Generation (Atypical) - olanzapine Failed - 08/16/2023  9:29 AM      Failed - This refill cannot be delegated      Failed - TSH in normal range and within 360 days    TSH  Date Value Ref Range Status  12/04/2021 8.380 (H) 0.450 - 4.500 uIU/mL Final         Failed - Last BP in normal range    BP Readings from Last 1 Encounters:  03/15/23 (!) 184/88         Failed - Valid encounter within last 6 months    Recent Outpatient Visits           9 months ago Benign essential HTN   [place] [place], Monico Blitz, DO   1 year ago Encounter for annual physical exam   [facility], Marzella Schlein, MD   2 years ago Subclinical hypothyroidism   [place] [facility] [place], Marzella Schlein, MD   2 years ago Benign essential HTN   [place] [facility] Merita Norton T, FNP   2 years ago Encounter for annual physical exam   [facility], Marzella Schlein, MD              Failed - Lipid Panel in normal range within the last 12 months    Cholesterol, Total  Date Value Ref Range Status  11/11/2022 130 100 - 199 mg/dL Final   LDL Chol Calc (NIH)  Date Value Ref Range Status  11/11/2022 49 0 - 99 mg/dL Final   HDL  Date Value Ref Range Status  11/11/2022 67 >39 mg/dL Final   Triglycerides  Date Value Ref Range Status  11/11/2022 65 0 - 149 mg/dL Final         Passed - Completed PHQ-2 or  PHQ-9 in the last 360 days      Passed - Last Heart Rate in normal range    Pulse Readings from Last 1 Encounters:  05/20/23 63         Passed - CBC within normal limits and completed in the last 12 months    WBC  Date Value Ref Range Status  05/20/2023 12.8 (H) 4.0 - 10.5 K/uL Final   RBC  Date Value Ref Range Status  05/20/2023 4.51 3.87 - 5.11 MIL/uL Final   Hemoglobin  Date Value Ref Range Status  05/20/2023 9.1 (L) 12.0 - 15.0 g/dL Final    Comment:    Reticulocyte Hemoglobin testing may be clinically indicated, consider ordering this additional test [id number]   11/11/2022 8.8 (L) 11.1 - 15.9 g/dL Final   HCT  Date Value Ref Range Status  05/20/2023 31.5 (L) 36.0 - 46.0 % Final   Hematocrit  Date Value Ref Range Status  11/11/2022 30.8 (L) 34.0 - 46.6 % Final   MCHC  Date Value Ref Range Status  05/20/2023 28.9 (L) 30.0 - 36.0 g/dL Final  [facility]  Date Value Ref Range Status  05/20/2023 20.2 (L) 26.0 - 34.0 pg Final   MCV  Date Value Ref Range Status  05/20/2023 69.8 (L) 80.0 - 100.0 fL Final  11/11/2022 66 (L) 79 - 97 fL Final  03/19/2014 84 80 - 100 fL Final   No results found for: "PLTCOUNTKUC", "LABPLAT", "POCPLA" RDW  Date Value Ref Range Status  05/20/2023 17.3 (H) 11.5 - 15.5 % Final  11/11/2022 17.0 (H) 11.7 - 15.4 % Final  03/19/2014 13.3 11.5 - 14.5 % Final         Passed - CMP within normal limits and completed in the last 12 months    Albumin  Date Value Ref Range Status  05/20/2023 3.7 3.5 - 5.0 g/dL Final  11/91/4782 3.8 3.7 - 4.7 g/dL Final  95/62/1308 3.7 3.4 - 5.0 g/dL Final   Alkaline Phosphatase  Date Value Ref Range Status  05/20/2023 86 38 - 126 U/L Final  02/15/2012 89 50 - 136 Unit/L Final   Alkaline phosphatase (APISO)  Date Value Ref Range Status  04/20/2017 83 33 - 130 U/L Final   ALT  Date Value Ref Range Status  05/20/2023 14 0 - 44 U/L Final   SGPT (ALT)  Date Value Ref Range Status  02/15/2012 27 12 - 78  U/L Final   AST  Date Value Ref Range Status  05/20/2023 24 15 - 41 U/L Final   BUN  Date Value Ref Range Status  05/20/2023 33 (H) 8 - 23 mg/dL Final  65/78/4696 17 8 - 27 mg/dL Final   Calcium  Date Value Ref Range Status  05/20/2023 9.0 8.9 - 10.3 mg/dL Final   CO2  Date Value Ref Range Status  05/20/2023 25 22 - 32 mmol/L Final   Creatinine  Date Value Ref Range Status  05/20/2023 1.55 (H) 0.44 - 1.00 mg/dL Final   Creat  Date Value Ref Range Status  04/20/2017 0.78 0.60 - 0.88 mg/dL Final    Comment:    For patients >48 years of age, the reference limit for Creatinine is approximately 13% higher for people identified as African-American. .    Glucose  Date Value Ref Range Status  04/03/2014 97 mg/dL Final   Glucose, Bld  Date Value Ref Range Status  05/20/2023 101 (H) 70 - 99 mg/dL Final    Comment:    Glucose reference range applies only to samples taken after fasting for at least 8 hours.   Glucose-Capillary  Date Value Ref Range Status  03/03/2019 112 (H) 70 - 99 mg/dL Final   Potassium  Date Value Ref Range Status  05/20/2023 4.7 3.5 - 5.1 mmol/L Final   Sodium  Date Value Ref Range Status  05/20/2023 141 135 - 145 mmol/L Final  11/11/2022 143 134 - 144 mmol/L Final   Total Bilirubin  Date Value Ref Range Status  05/20/2023 0.8 <1.2 mg/dL Final   Bilirubin, Direct  Date Value Ref Range Status  02/15/2012 0.1 0.00 - 0.20 mg/dL Final   Protein, ur  Date Value Ref Range Status  03/03/2019 NEGATIVE NEGATIVE mg/dL Final   Protein, UA  Date Value Ref Range Status  03/30/2019 Negative Negative Final   Total Protein  Date Value Ref Range Status  05/20/2023 6.6 6.5 - 8.1 g/dL Final  29/52/8413 5.7 (L) 6.0 - 8.5 g/dL Final  24/40/1027 6.9 6.4 - 8.2 g/dL Final   GFR, Est African American  Date Value Ref Range Status  04/20/2017 83 >  OR = 60 mL/min/1.49m2 Final   GFR calc Af Amer  Date Value Ref Range Status  03/07/2020 >60 >60 mL/min  Final   eGFR  Date Value Ref Range Status  11/11/2022 60 >59 mL/min/1.73 Final   GFR, Est Non African American  Date Value Ref Range Status  04/20/2017 72 > OR = 60 mL/min/1.33m2 Final   GFR, Estimated  Date Value Ref Range Status  05/20/2023 32 (L) >60 mL/min Final    Comment:    (NOTE) Calculated using the CKD-EPI Creatinine Equation (2021)             Requested Prescriptions  Pending Prescriptions Disp Refills   OLANZapine (ZYPREXA) 2.5 MG tablet [Pharmacy Med Name: OLANZAPINE 2.5 MG TABLET] 90 tablet 1    Sig: TAKE 1 TABLET BY MOUTH EVERYDAY AT BEDTIME     Not Delegated - Psychiatry:  Antipsychotics - Second Generation (Atypical) - olanzapine Failed - 08/16/2023  9:29 AM      Failed - This refill cannot be delegated      Failed - TSH in normal range and within 360 days    TSH  Date Value Ref Range Status  12/04/2021 8.380 (H) 0.450 - 4.500 uIU/mL Final         Failed - Last BP in normal range    BP Readings from Last 1 Encounters:  03/15/23 (!) 184/88         Failed - Valid encounter within last 6 months    Recent Outpatient Visits           9 months ago Benign essential HTN   [facility], Monico Blitz, DO   1 year ago Encounter for annual physical exam   [place] [facility] [place], Marzella Schlein, MD   2 years ago Subclinical hypothyroidism   [facility] Erasmo Downer, MD   2 years ago Benign essential HTN   [facility] Merita Norton T, FNP   2 years ago Encounter for annual physical exam   [facility] [place], Marzella Schlein, MD              Failed - Lipid Panel in normal range within the last 12 months    Cholesterol, Total  Date Value Ref Range Status  11/11/2022 130 100 - 199 mg/dL Final   LDL Chol Calc (NIH)  Date Value Ref Range Status  11/11/2022 49 0 - 99 mg/dL Final   HDL  Date Value Ref Range Status   11/11/2022 67 >39 mg/dL Final   Triglycerides  Date Value Ref Range Status  11/11/2022 65 0 - 149 mg/dL Final         Passed - Completed PHQ-2 or PHQ-9 in the last 360 days      Passed - Last Heart Rate in normal range    Pulse Readings from Last 1 Encounters:  05/20/23 63         Passed - CBC within normal limits and completed in the last 12 months    WBC  Date Value Ref Range Status  05/20/2023 12.8 (H) 4.0 - 10.5 K/uL Final   RBC  Date Value Ref Range Status  05/20/2023 4.51 3.87 - 5.11 MIL/uL Final   Hemoglobin  Date Value Ref Range Status  05/20/2023 9.1 (L) 12.0 - 15.0 g/dL Final    Comment:    Reticulocyte Hemoglobin testing may be clinically indicated, consider ordering this additional test  [id number]   11/11/2022 8.8 (L) 11.1 - 15.9 g/dL Final   HCT  Date Value Ref Range Status  05/20/2023 31.5 (L) 36.0 - 46.0 % Final   Hematocrit  Date Value Ref Range Status  11/11/2022 30.8 (L) 34.0 - 46.6 % Final   MCHC  Date Value Ref Range Status  05/20/2023 28.9 (L) 30.0 - 36.0 g/dL Final   [facility]  Date Value Ref Range Status  05/20/2023 20.2 (L) 26.0 - 34.0 pg Final   MCV  Date Value Ref Range Status  05/20/2023 69.8 (L) 80.0 - 100.0 fL Final  11/11/2022 66 (L) 79 - 97 fL Final  03/19/2014 84 80 - 100 fL Final   No results found for: "PLTCOUNTKUC", "LABPLAT", "POCPLA" RDW  Date Value Ref Range Status  05/20/2023 17.3 (H) 11.5 - 15.5 % Final  11/11/2022 17.0 (H) 11.7 - 15.4 % Final  03/19/2014 13.3 11.5 - 14.5 % Final         Passed - CMP within normal limits and completed in the last 12 months    Albumin  Date Value Ref Range Status  05/20/2023 3.7 3.5 - 5.0 g/dL Final  84/69/6295 3.8 3.7 - 4.7 g/dL Final  28/41/3244 3.7 3.4 - 5.0 g/dL Final   Alkaline Phosphatase  Date Value Ref Range Status  05/20/2023 86 38 - 126 U/L Final  02/15/2012 89 50 - 136 Unit/L Final   Alkaline phosphatase (APISO)  Date Value Ref Range Status  04/20/2017 83 33 -  130 U/L Final   ALT  Date Value Ref Range Status  05/20/2023 14 0 - 44 U/L Final   SGPT (ALT)  Date Value Ref Range Status  02/15/2012 27 12 - 78 U/L Final   AST  Date Value Ref Range Status  05/20/2023 24 15 - 41 U/L Final   BUN  Date Value Ref Range Status  05/20/2023 33 (H) 8 - 23 mg/dL Final  07/01/7251 17 8 - 27 mg/dL Final   Calcium  Date Value Ref Range Status  05/20/2023 9.0 8.9 - 10.3 mg/dL Final   CO2  Date Value Ref Range Status  05/20/2023 25 22 - 32 mmol/L Final   Creatinine  Date Value Ref Range Status  05/20/2023 1.55 (H) 0.44 - 1.00 mg/dL Final   Creat  Date Value Ref Range Status  04/20/2017 0.78 0.60 - 0.88 mg/dL Final    Comment:    For patients >61 years of age, the reference limit for Creatinine is approximately 13% higher for people identified as African-American. .    Glucose  Date Value Ref Range Status  04/03/2014 97 mg/dL Final   Glucose, Bld  Date Value Ref Range Status  05/20/2023 101 (H) 70 - 99 mg/dL Final    Comment:    Glucose reference range applies only to samples taken after fasting for at least 8 hours.   Glucose-Capillary  Date Value Ref Range Status  03/03/2019 112 (H) 70 - 99 mg/dL Final   Potassium  Date Value Ref Range Status  05/20/2023 4.7 3.5 - 5.1 mmol/L Final   Sodium  Date Value Ref Range Status  05/20/2023 141 135 - 145 mmol/L Final  11/11/2022 143 134 - 144 mmol/L Final   Total Bilirubin  Date Value Ref Range Status  05/20/2023 0.8 <1.2 mg/dL Final   Bilirubin, Direct  Date Value Ref Range Status  02/15/2012 0.1 0.00 - 0.20 mg/dL Final   Protein, ur  Date Value Ref Range Status  03/03/2019 NEGATIVE NEGATIVE  mg/dL Final   Protein, UA  Date Value Ref Range Status  03/30/2019 Negative Negative Final   Total Protein  Date Value Ref Range Status  05/20/2023 6.6 6.5 - 8.1 g/dL Final  16/03/9603 5.7 (L) 6.0 - 8.5 g/dL Final  54/02/8118 6.9 6.4 - 8.2 g/dL Final   GFR, Est African American   Date Value Ref Range Status  04/20/2017 83 > OR = 60 mL/min/1.75m2 Final   GFR calc Af Amer  Date Value Ref Range Status  03/07/2020 >60 >60 mL/min Final   eGFR  Date Value Ref Range Status  11/11/2022 60 >59 mL/min/1.73 Final   GFR, Est Non African American  Date Value Ref Range Status  04/20/2017 72 > OR = 60 mL/min/1.22m2 Final   GFR, Estimated  Date Value Ref Range Status  05/20/2023 32 (L) >60 mL/min Final    Comment:    (NOTE) Calculated using the CKD-EPI Creatinine Equation (2021)

## 2023-08-17 ENCOUNTER — Ambulatory Visit: Payer: PPO | Admitting: Family Medicine

## 2023-08-17 ENCOUNTER — Encounter: Payer: Self-pay | Admitting: Family Medicine

## 2023-08-17 VITALS — BP 135/60 | HR 66 | Wt 120.4 lb

## 2023-08-17 DIAGNOSIS — K591 Functional diarrhea: Secondary | ICD-10-CM | POA: Diagnosis not present

## 2023-08-17 DIAGNOSIS — R001 Bradycardia, unspecified: Secondary | ICD-10-CM | POA: Diagnosis not present

## 2023-08-17 DIAGNOSIS — C9110 Chronic lymphocytic leukemia of B-cell type not having achieved remission: Secondary | ICD-10-CM

## 2023-08-17 DIAGNOSIS — I10 Essential (primary) hypertension: Secondary | ICD-10-CM

## 2023-08-17 DIAGNOSIS — R413 Other amnesia: Secondary | ICD-10-CM

## 2023-08-17 MED ORDER — METOPROLOL SUCCINATE ER 50 MG PO TB24: 50.0000 mg | ORAL_TABLET | Freq: Every day | ORAL | 1 refills | Status: DC

## 2023-08-17 MED ORDER — DULOXETINE HCL 20 MG PO CPEP: 20.0000 mg | ORAL_CAPSULE | Freq: Every day | ORAL | 1 refills | Status: DC

## 2023-08-17 NOTE — Progress Notes (Signed)
 Acute visit   Patient: Barbara Chambers   DOB: December 14, 1934   89 y.o. Female  MRN: [id number]  Chief Complaint  Patient presents with   Diarrhea    Patient complains of diarrhea. Onset of diarrhea was 1 month ago. Diarrhea is occurring approximately 3 times per day. . Previous visits for diarrhea: none.    Subjective    Discussed the use of AI scribe software for clinical note transcription with the patient, who gave verbal consent to proceed.  History of Present Illness   The patient, with a history of cardiovascular disease and neurological conditions, presents with intermittent diarrhea for the past month. The episodes of diarrhea are described as 'unbearable' at times, with the patient experiencing difficulty reaching the bathroom in time. The patient reports relief from symptoms after taking Imodium. The patient denies any significant changes in diet or exposure to sick individuals around the onset of symptoms. The patient also denies experiencing severe abdominal pain during these episodes. The patient's son reports that the patient has had similar episodes in the past, with one occurring around the last Thanksgiving. The patient's son also raises concerns about the patient's low heart rate, which was noted during a recent visit to the neurologist. The patient's current medication regimen includes metoprolol, rivastigmine, and Imbruvica. The patient's son expresses concerns about the potential side effects of these medications, particularly the metoprolol and rivastigmine, which he suspects may be contributing to the patient's diarrhea.        Review of Systems  Objective    BP 135/60 (BP Location: Left Arm, Patient Position: Sitting, Cuff Size: Normal)   Pulse 66   Wt 120 lb 6.4 oz (54.6 kg)   SpO2 100%   BMI 22.75 kg/m  Physical Exam Vitals reviewed.  Constitutional:      General: She is not in acute distress.    Appearance: Normal appearance. She is well-developed. She is  not diaphoretic.  HENT:     Head: Normocephalic and atraumatic.  Eyes:     General: No scleral icterus.    Conjunctiva/sclera: Conjunctivae normal.  Neck:     Thyroid: No thyromegaly.  Cardiovascular:     Rate and Rhythm: Regular rhythm. Bradycardia present.     Heart sounds: Normal heart sounds.  Pulmonary:     Effort: Pulmonary effort is normal. No respiratory distress.     Breath sounds: Normal breath sounds. No wheezing, rhonchi or rales.  Abdominal:     General: There is no distension.     Palpations: Abdomen is soft.     Tenderness: There is no abdominal tenderness.  Musculoskeletal:     Cervical back: Neck supple.     Right lower leg: No edema.     Left lower leg: No edema.  Lymphadenopathy:     Cervical: No cervical adenopathy.  Skin:    General: Skin is warm and dry.     Findings: No rash.  Neurological:     Mental Status: She is alert and oriented to person, place, and time. Mental status is at baseline.  Psychiatric:        Mood and Affect: Mood normal.        Behavior: Behavior normal.       No results found for any visits on 08/17/23.  Assessment & Plan     Problem List Items Addressed This Visit       Cardiovascular and Mediastinum   Benign essential HTN   Relevant Medications  metoprolol succinate (TOPROL-XL) 50 MG 24 hr tablet     Other   CLL (chronic lymphocytic leukemia) (HCC)   Memory loss   Other Visit Diagnoses       Functional diarrhea    -  Primary     Bradycardia               Diarrhea Intermittent diarrhea for the past month, effectively managed with Imodium. Likely medication-induced, considering rivastigmine (19% incidence) and Imbruvica (28-59% incidence). Unlikely infectious due to episodic nature. Discussed reassessing goals of care with Dr. Orlie Dakin to evaluate continuing Imbruvica. - Recommend Imodium as needed, maximum 8 mg/day - Monitor for constipation as a side effect of Imodium - Discuss goals of care and  medication review with Dr. Orlie Dakin  Bradycardia Heart rate as low as 40s, currently 66 bpm. On 100 mg metoprolol, considered a high dose. Neurologist and cardiologist involved. Risk of lightheadedness and falls due to low heart rate. Discussed potential benefits of reducing metoprolol. - Reduce metoprolol dose to 50 mg - Monitor heart rate and blood pressure - Follow-up in one month to reassess heart rate and overall condition  Medication Management On multiple medications including metoprolol, rivastigmine, Imbruvica, duloxetine, and Zyprexa. Need for ongoing review of medication efficacy and side effects, particularly with Imbruvica and rivastigmine. - Refill duloxetine prescription - Continue current medications including Zyprexa - Review medication list and side effects regularly  General Health Maintenance Flu vaccination status checked. No contraindications for flu vaccine. - Administer flu shot before she leaves the clinic  Follow-up - Schedule follow-up visit in one month - Arrange for a physical examination in one to two months.         Meds ordered this encounter  Medications   DULoxetine (CYMBALTA) 20 MG capsule    Sig: Take 1 capsule (20 mg total) by mouth daily.    Dispense:  90 capsule    Refill:  1   metoprolol succinate (TOPROL-XL) 50 MG 24 hr tablet    Sig: Take 1 tablet (50 mg total) by mouth daily. Take with or immediately following a meal.    Dispense:  90 tablet    Refill:  1    Please schedule OV for further refills     Return in about 4 weeks (around 09/14/2023) for CPE.      Shirlee Latch, MD  [facility] [phone] (phone) [phone] (fax)  [facility]

## 2023-08-20 ENCOUNTER — Inpatient Hospital Stay: Payer: PPO | Attending: Oncology

## 2023-08-20 DIAGNOSIS — C9110 Chronic lymphocytic leukemia of B-cell type not having achieved remission: Secondary | ICD-10-CM | POA: Diagnosis not present

## 2023-08-20 DIAGNOSIS — D649 Anemia, unspecified: Secondary | ICD-10-CM | POA: Diagnosis not present

## 2023-08-20 LAB — CMP (CANCER CENTER ONLY)
ALT: 15 U/L (ref 0–44)
AST: 24 U/L (ref 15–41)
Albumin: 3.7 g/dL (ref 3.5–5.0)
Alkaline Phosphatase: 75 U/L (ref 38–126)
Anion gap: 12 (ref 5–15)
BUN: 35 mg/dL — ABNORMAL HIGH (ref 8–23)
CO2: 24 mmol/L (ref 22–32)
Calcium: 8.8 mg/dL — ABNORMAL LOW (ref 8.9–10.3)
Chloride: 105 mmol/L (ref 98–111)
Creatinine: 1.39 mg/dL — ABNORMAL HIGH (ref 0.44–1.00)
GFR, Estimated: 37 mL/min — ABNORMAL LOW (ref >60)
Glucose, Bld: 168 mg/dL — ABNORMAL HIGH (ref 70–99)
Potassium: 3.8 mmol/L (ref 3.5–5.1)
Sodium: 141 mmol/L (ref 135–145)
Total Bilirubin: 0.6 mg/dL (ref 0.0–1.2)
Total Protein: 6.5 g/dL (ref 6.5–8.1)

## 2023-08-20 LAB — CBC WITH DIFFERENTIAL/PLATELET
Abs Immature Granulocytes: 0.07 10*3/uL (ref 0.00–0.07)
Basophils Absolute: 0 10*3/uL (ref 0.0–0.1)
Basophils Relative: 0 %
Eosinophils Absolute: 0 10*3/uL (ref 0.0–0.5)
Eosinophils Relative: 0 %
HCT: 30.3 % — ABNORMAL LOW (ref 36.0–46.0)
Hemoglobin: 8.6 g/dL — ABNORMAL LOW (ref 12.0–15.0)
Immature Granulocytes: 1 %
Lymphocytes Relative: 31 %
Lymphs Abs: 3.1 10*3/uL (ref 0.7–4.0)
MCH: 18.6 pg — ABNORMAL LOW (ref 26.0–34.0)
MCHC: 28.4 g/dL — ABNORMAL LOW (ref 30.0–36.0)
MCV: 65.4 fL — ABNORMAL LOW (ref 80.0–100.0)
Monocytes Absolute: 0.7 10*3/uL (ref 0.1–1.0)
Monocytes Relative: 7 %
Neutro Abs: 6.2 10*3/uL (ref 1.7–7.7)
Neutrophils Relative %: 61 %
Platelets: 367 10*3/uL (ref 150–400)
RBC: 4.63 MIL/uL (ref 3.87–5.11)
RDW: 17.7 % — ABNORMAL HIGH (ref 11.5–15.5)
WBC: 10.1 10*3/uL (ref 4.0–10.5)
nRBC: 0 % (ref 0.0–0.2)

## 2023-08-20 LAB — IRON AND TIBC
Iron: 17 ug/dL — ABNORMAL LOW (ref 28–170)
Saturation Ratios: 3 % — ABNORMAL LOW (ref 10.4–31.8)
TIBC: 554 ug/dL — ABNORMAL HIGH (ref 250–450)
UIBC: 537 ug/dL

## 2023-08-20 LAB — FERRITIN: Ferritin: 5 ng/mL — ABNORMAL LOW (ref 11–307)

## 2023-08-30 ENCOUNTER — Ambulatory Visit: Payer: Self-pay | Admitting: Family Medicine

## 2023-08-30 ENCOUNTER — Other Ambulatory Visit (HOSPITAL_COMMUNITY): Payer: Self-pay

## 2023-08-30 NOTE — Telephone Encounter (Signed)
 Pt hx of dementia, struggling to communicate with RN. Pt unsure if it coming from vagina, rectum, or urethra.   Son, DPR, called and stated that mother reported bleeding when urinating, to her sister.    Chief Complaint: bleeding from peri area, unsure exact location Symptoms: bleeding Frequency: yesterday was last episode per pt  Disposition: [] ED /[] Urgent Care (no appt availability in office) / [x] Appointment(In office/virtual)/ []  [facility]/ [] Home Care/ [] Refused Recommended Disposition /[] [facility]/ []  Follow-up with PCP  Additional Notes: Pt is poor historian and unclear where the bleeding is coming from or when last episode was. RN questions whether it was from the vagina, urethra, or rectum, pt states she is unsure. Pt unclear if she is having abd pain, pt states that she has problems with her memory. At this time call was terminated with pt and DPR was called to get more information. Per son mother had reported blood in urine to her sister yesterday. When confronted pt stated that it was an excuse to skip a funeral. Son, [name] states that she has been more confused than normal. Per son pt does her own laundry. Per son he is unsure if this is real or not. Pt sched for tomorrow with another prov in PCP office. Son advised to seek UC/ED treatment if him/other family members witness increase in confusion, fever, urinary s/s. Son agreeable.  Copied from CRM [id number]. Topic: Clinical - Red Word Triage >> Aug 30, 2023  2:14 PM Philippa Chester F wrote: Red Word that prompted transfer to Nurse Triage: Passing blood instead of urine; When her son, [name], asked he stated she stated it was only when she wiped.   Patient could not be warm transferred as she is at home. Call was made by her son.   Patient is at home; Please call the patient at  [phone]  (Please be advised that the patient has early stages of dementia.)   Please call back Mark at [phone] after a decision has  been made on how to move forward. Reason for Disposition . [1] Pink or red-colored urine and likely from food (beets, rhubarb, red food dye) AND [2] lasts > 24 hours after stopping food  Answer Assessment - Initial Assessment Questions 1. COLOR of URINE: "Describe the color of the urine."  (e.g., tea-colored, pink, red, bloody) "Do you have blood clots in your urine?" (e.g., none, pea, grape, small coin)     Unsure pt poor historian 2. ONSET: "When did the bleeding start?"      Per family recently 3. EPISODES: "How many times has there been blood in the urine?" or "How many times today?"     Unsure poor historian 4. PAIN with URINATION: "Is there any pain with passing your urine?" If Yes, ask: "How bad is the pain?"  (Scale 1-10; or mild, moderate, severe)    - MILD: Complains slightly about urination hurting.    - MODERATE: Interferes with normal activities.      - SEVERE: Excruciating, unwilling or unable to urinate because of the pain.      Unsure, she has not c/o pain recently 5. FEVER: "Do you have a fever?" If Yes, ask: "What is your temperature, how was it measured, and when did it start?"     Unsure, son is not with mother pt cannot answer question 6. ASSOCIATED SYMPTOMS: "Are you passing urine more frequently than usual?"     Unsure, 7. OTHER SYMPTOMS: "Do you have any other symptoms?" (  e.g., back/flank pain, abdomen pain, vomiting)     Unclear, pt does not states yes/no to abd pain. States "sometimes", but does not specify where  Protocols used: Urine - Blood In-A-AH

## 2023-08-31 ENCOUNTER — Ambulatory Visit
Admission: RE | Admit: 2023-08-31 | Discharge: 2023-08-31 | Disposition: A | Discharge status: Home / Self Care | Source: Ambulatory Visit | Attending: Family Medicine | Admitting: Family Medicine

## 2023-08-31 ENCOUNTER — Encounter: Payer: Self-pay | Admitting: Family Medicine

## 2023-08-31 ENCOUNTER — Ambulatory Visit (INDEPENDENT_AMBULATORY_CARE_PROVIDER_SITE_OTHER): Admitting: Family Medicine

## 2023-08-31 VITALS — BP 119/41 | HR 59 | Ht 61.0 in | Wt 122.0 lb

## 2023-08-31 DIAGNOSIS — N950 Postmenopausal bleeding: Secondary | ICD-10-CM

## 2023-08-31 DIAGNOSIS — R3129 Other microscopic hematuria: Secondary | ICD-10-CM | POA: Diagnosis not present

## 2023-08-31 DIAGNOSIS — N939 Abnormal uterine and vaginal bleeding, unspecified: Secondary | ICD-10-CM | POA: Diagnosis not present

## 2023-08-31 DIAGNOSIS — R9389 Abnormal findings on diagnostic imaging of other specified body structures: Secondary | ICD-10-CM | POA: Diagnosis not present

## 2023-08-31 NOTE — Progress Notes (Signed)
 Established patient visit   Patient: Barbara Chambers   DOB: 05/31/1935   89 y.o. Female  MRN: [id number] Visit Date: 08/31/2023  Today's healthcare provider: Ronnald Ramp, MD   Chief Complaint  Patient presents with   Vaginal Bleeding    Off and on bleeding for about a month, its only when she wipe, she is not sure where its coming from. Once when she whipped she saw stool and blood.    Subjective     HPI     Vaginal Bleeding    Additional comments: Off and on bleeding for about a month, its only when she wipe, she is not sure where its coming from. Once when she whipped she saw stool and blood.       Last edited by Thedora Hinders, CMA on 08/31/2023  9:43 AM.       Discussed the use of AI scribe software for clinical note transcription with the patient, who gave verbal consent to proceed.  History of Present Illness   Barbara Chambers is an 89 year old female who presents with concerns of rectal or vaginal bleeding.  She has been experiencing bleeding for the past two months, primarily noticing it when wiping. The bleeding resembles a menstrual period, but she is uncertain of its source. Occasionally, she wears a pad, though it is not consistently necessary.  A recent hemoglobin test indicated a level of 8.6, suggesting anemia, which may be related to the ongoing bleeding. She has not experienced similar issues in the past.  She mentions occasional abdominal pain, which is not consistently associated with the bleeding.  She reports regular bowel movements but sometimes needs to strain. She recalls a recent bowel movement that was larger than usual, which she found unusual compared to her typical bowel movements.  She has a history of diarrhea, previously treated with Imodium. No active diarrhea was noted during the visit.       Past Medical History:  Diagnosis Date   Chronic lymphocytic leukemia (HCC) 2011   Hyperlipidemia    Hypertension      Medications: Outpatient Medications Prior to Visit  Medication Sig   atorvastatin (LIPITOR) 40 MG tablet Take 40 mg by mouth daily.   COENZYME Q-10 PO Take 200 mg by mouth daily.    DULoxetine (CYMBALTA) 20 MG capsule Take 1 capsule (20 mg total) by mouth daily.   furosemide (LASIX) 20 MG tablet Take 1 tablet by mouth daily.   ibrutinib (IMBRUVICA) 420 MG tablet TAKE 1 TABLET BY MOUTH DAILY.   loperamide (IMODIUM) 1 MG/5ML solution Take 2 mg by mouth 4 (four) times daily as needed for diarrhea or loose stools.   metoprolol succinate (TOPROL-XL) 50 MG 24 hr tablet Take 1 tablet (50 mg total) by mouth daily. Take with or immediately following a meal.   OLANZapine (ZYPREXA) 2.5 MG tablet TAKE 1 TABLET BY MOUTH EVERYDAY AT BEDTIME   Potassium Chloride ER 20 MEQ TBCR Take 1 tablet by mouth daily.   rivastigmine (EXELON) 1.5 MG capsule Take 1.5 mg by mouth 2 (two) times daily.   SODIUM FLUORIDE 5000 PPM 1.1 % PSTE See admin instructions.   telmisartan (MICARDIS) 40 MG tablet TAKE 2 TABLETS BY MOUTH EVERY DAY   vitamin B-12 (CYANOCOBALAMIN) 1000 MCG tablet Take 1,000 mcg by mouth daily.   No facility-administered medications prior to visit.    Review of Systems  Last CBC Lab Results  Component Value Date   WBC  10.1 08/20/2023   HGB 8.6 (L) 08/20/2023   HCT 30.3 (L) 08/20/2023   MCV 65.4 (L) 08/20/2023   MCH 18.6 (L) 08/20/2023   RDW 17.7 (H) 08/20/2023   PLT 367 08/20/2023   Last metabolic panel Lab Results  Component Value Date   GLUCOSE 168 (H) 08/20/2023   NA 141 08/20/2023   K 3.8 08/20/2023   CL 105 08/20/2023   CO2 24 08/20/2023   BUN 35 (H) 08/20/2023   CREATININE 1.39 (H) 08/20/2023   GFRNONAA 37 (L) 08/20/2023   CALCIUM 8.8 (L) 08/20/2023   PHOS 3.3 03/17/2019   PROT 6.5 08/20/2023   ALBUMIN 3.7 08/20/2023   LABGLOB 1.9 11/11/2022   AGRATIO 2.0 11/11/2022   BILITOT 0.6 08/20/2023   ALKPHOS 75 08/20/2023   AST 24 08/20/2023   ALT 15 08/20/2023   ANIONGAP  12 08/20/2023   Last hemoglobin A1c Lab Results  Component Value Date   HGBA1C 6.3 (H) 12/04/2021   Last thyroid functions Lab Results  Component Value Date   TSH 8.380 (H) 12/04/2021   T4TOTAL 6.9 08/27/2016        Objective    BP (!) 119/41   Pulse (!) 59   Ht 5\' 1"  (1.549 m)   Wt 122 lb (55.3 kg)   SpO2 100%   BMI 23.05 kg/m  BP Readings from Last 3 Encounters:  08/31/23 (!) 119/41  08/17/23 135/60  03/15/23 (!) 184/88   Wt Readings from Last 3 Encounters:  08/31/23 122 lb (55.3 kg)  08/17/23 120 lb 6.4 oz (54.6 kg)  05/20/23 119 lb 12.8 oz (54.3 kg)        Physical Exam  Physical Exam    GENITOURINARY: Cervix normal, tissue healthy, no bleeding from external os,no blood in vaginal vault  RECTAL: No rectal bleeding visualized on exam, loose stool observed on rectal exam       No results found for any visits on 08/31/23.  Assessment & Plan     Problem List Items Addressed This Visit   None Visit Diagnoses       Postmenopausal vaginal bleeding    -  Primary   Relevant Orders   US Pelvic Complete With Transvaginal   Urinalysis, Routine w reflex microscopic           Rectal or Vaginal Bleeding Acute  Reports bleeding when wiping for two months with occasional abdominal pain. Source of bleeding is unclear. Vaginal exam showed no active bleeding, and rectal exam was negative for blood. Hemoglobin is low at 8.6, indicating possible anemia. Differential diagnosis includes vaginal or rectal bleeding, possibly related to gastrointestinal issues. Hemocult test was negative, suggesting no rectal bleeding. Negative fecal occult tests - Order stat transvaginal ultrasound to evaluate for potential vaginal bleeding. - If ultrasound is negative, consider referral to gastroenterologist for further evaluation.    Return in about 3 weeks (around 09/21/2023) for bleeding .         Ronnald Ramp, MD  [facility] [phone] (phone) [phone] (fax)  [facility]

## 2023-08-31 NOTE — Telephone Encounter (Signed)
 FYI - need to confirm history with objective findings, likely pelvic exam given unclear source. She has dementia and cannot give a reliable history.

## 2023-08-31 NOTE — Patient Instructions (Addendum)
 VISIT SUMMARY:  Today, we discussed your concerns about rectal or vaginal bleeding that you have been experiencing for the past two months. We also reviewed your history of occasional diarrhea.  YOUR PLAN:  -RECTAL OR VAGINAL BLEEDING: You have been experiencing bleeding when wiping for the past two months, with occasional abdominal pain. A recent hemoglobin test showed a low level of 8.6, which suggests anemia. We performed a vaginal and rectal exam, both of which did not show active bleeding. A Hemocult test was also negative, indicating no rectal bleeding. We will order a transvaginal ultrasound to check for potential vaginal bleeding. If the ultrasound does not show any issues, we may refer you to a gastroenterologist for further evaluation.  I have also ordered a urine study to make sure there is no blood present in the urine   -DIARRHEA: You have a history of diarrhea that was previously treated with Imodium. There was no evidence of active diarrhea during today's visit. We will monitor your symptoms and consider further evaluation if the diarrhea persists.  INSTRUCTIONS:  Please schedule a transvaginal ultrasound as soon as possible. If the ultrasound results are normal, we will discuss the next steps, which may include a referral to a gastroenterologist. Monitor your symptoms and keep track of any changes, especially if the diarrhea returns or if you notice any new symptoms. Follow up with [place] if you have any concerns or if your symptoms worsen.

## 2023-09-01 LAB — URINALYSIS, ROUTINE W REFLEX MICROSCOPIC
Bilirubin, UA: NEGATIVE
Glucose, UA: NEGATIVE
Ketones, UA: NEGATIVE
Nitrite, UA: NEGATIVE
Specific Gravity, UA: 1.01 (ref 1.005–1.030)
Urobilinogen, Ur: 0.2 mg/dL (ref 0.2–1.0)
pH, UA: 6.5 (ref 5.0–7.5)

## 2023-09-01 LAB — MICROSCOPIC EXAMINATION: Casts: NONE SEEN /LPF

## 2023-09-01 LAB — SPECIMEN STATUS REPORT

## 2023-09-02 ENCOUNTER — Telehealth: Payer: Self-pay

## 2023-09-02 ENCOUNTER — Telehealth: Payer: Self-pay | Admitting: Family Medicine

## 2023-09-02 NOTE — Telephone Encounter (Signed)
 Copied from CRM [id number]. Topic: Clinical - Lab/Test Results >> Sep 02, 2023  9:31 AM Doristine Johns J wrote: Reason for CRM: Pt son returned called to [facility] regarding Pt Ultrasound results, CAL connected stated she wont be in till noon, pls call [name] back to discuss those results   [phone]

## 2023-09-02 NOTE — Addendum Note (Signed)
 Addended by: Bing Neighbors on: 09/02/2023 07:26 AM   Modules accepted: Orders

## 2023-09-02 NOTE — Telephone Encounter (Signed)
 Patient returning your call about her Ultrasound Results. Follow up with patient please

## 2023-09-02 NOTE — Telephone Encounter (Signed)
 Please see other telephone encounter.

## 2023-09-02 NOTE — Telephone Encounter (Signed)
 Returned call. Pt son advised and verbalized understanding.

## 2023-09-02 NOTE — Progress Notes (Signed)
 Called and spoke to the pt [name] to inform him per Dr Roxan Hockey red blood cell was found present in her urine, a referral has been placed to urology and someone from their team should be reaching out soon to get her scheduled. He verbally stated he understood.

## 2023-09-03 ENCOUNTER — Other Ambulatory Visit: Payer: Self-pay

## 2023-09-03 ENCOUNTER — Other Ambulatory Visit: Payer: Self-pay | Admitting: Pharmacy Technician

## 2023-09-03 NOTE — Progress Notes (Signed)
 Specialty Pharmacy Refill Coordination Note  Barbara Chambers is a 89 y.o. female contacted today regarding refills of specialty medication(s) Ibrutinib [name])   Patient requested Delivery   Delivery date: 09/08/23   Verified address: [address] [place]   Medication will be filled on 09/07/23.

## 2023-09-13 NOTE — Progress Notes (Unsigned)
 MRN : [id number]  Barbara Chambers is a 89 y.o. (21-Feb-1935) female who presents with chief complaint of legs swell.  History of Present Illness:   The patient returns to the office for followup evaluation regarding leg swelling.  The swelling has persisted but with the lymph pump her swelling is under better controlled. The pain associated with swelling is decreased. There have not been any interval development of a ulcerations or wounds.  The patient denies problems with the pump, noting it is working well and the leggings are in good condition.  Since the previous visit the patient has been wearing graduated compression stockings and using the lymph pump on a routine basis (for the most part a minimum of once a day but not infrequently her son is able to get her the pump on for a second session in the day) and  has noted significant improvement in the lymphedema.   Patient stated the lymph pump has been helpful with the treatment of the lymphedema.    No outpatient medications have been marked as taking for the 09/16/23 encounter (Appointment) with Gilda Crease, Latina Craver, MD.    Past Medical History:  Diagnosis Date   Chronic lymphocytic leukemia (HCC) 2011   Hyperlipidemia    Hypertension     Past Surgical History:  Procedure Laterality Date   cataract surgery Bilateral 2005   CHOLECYSTECTOMY  1991   RECONSTRUCTION OF EYELID      Social History Social History   Tobacco Use   Smoking status: Never   Smokeless tobacco: Never  Vaping Use   Vaping status: Never Used  Substance Use Topics   Alcohol use: No   Drug use: No    Family History Family History  Problem Relation Age of Onset   Hypertension Mother    Stroke Mother    CAD Mother    Hypertension Father    Heart attack Father    Hypertension Brother    Seizures Brother    Hypertension Son    Breast cancer Neg Hx     Allergies  Allergen Reactions   Hctz [Hydrochlorothiazide]      Hyponatremia   Penicillins Rash     REVIEW OF SYSTEMS (Negative unless checked)  Constitutional: [] Weight loss  [] Fever  [] Chills Cardiac: [] Chest pain   [] Chest pressure   [] Palpitations   [] Shortness of breath when laying flat   [] Shortness of breath with exertion. Vascular:  [] Pain in legs with walking   [x] Pain in legs with standing  [] History of DVT   [] Phlebitis   [x] Swelling in legs   [] Varicose veins   [] Non-healing ulcers Pulmonary:   [] Uses home oxygen   [] Productive cough   [] Hemoptysis   [] Wheeze  [] COPD   [] Asthma Neurologic:  [] Dizziness   [] Seizures   [] History of stroke   [] History of TIA  [] Aphasia   [] Vissual changes   [] Weakness or numbness in arm   [] Weakness or numbness in leg Musculoskeletal:   [] Joint swelling   [x] Joint pain   [] Low back pain Hematologic:  [] Easy bruising  [] Easy bleeding   [] Hypercoagulable state   [] Anemic Gastrointestinal:  [] Diarrhea   [] Vomiting  [] Gastroesophageal reflux/heartburn   [] Difficulty swallowing. Genitourinary:  [] Chronic kidney disease   [] Difficult urination  [] Frequent urination   [] Blood in urine Skin:  [] Rashes   [] Ulcers  Psychological:  [  x]History of anxiety   []  History of major depression.  Physical Examination  There were no vitals filed for this visit. There is no height or weight on file to calculate BMI. Gen: WD/WN, NAD Head: [place]/AT, No temporalis wasting.  Ear/Nose/Throat: Hearing grossly intact, nares w/o erythema or drainage, pinna without lesions Eyes: PER, EOMI, sclera nonicteric.  Neck: Supple, no gross masses.  No JVD.  Pulmonary:  Good air movement, no audible wheezing, no use of accessory muscles.  Cardiac: RRR, precordium not hyperdynamic. Vascular:  scattered varicosities present bilaterally.  Mild venous stasis changes to the legs bilaterally.  2+ soft pitting edema, CEAP [id number]  Vessel Right Left  Radial Palpable Palpable  Gastrointestinal: soft, non-distended. No guarding/no peritoneal signs.   Musculoskeletal: M/S 5/5 throughout.  No deformity.  Neurologic: CN 2-12 intact. Pain and light touch intact in extremities.  Symmetrical.  Speech is fluent. Motor exam as listed above. Psychiatric: Judgment intact, Mood & affect appropriate for pt's clinical situation. Dermatologic: Venous rashes no ulcers noted.  No changes consistent with cellulitis. Lymph : No lichenification or skin changes of chronic lymphedema.  CBC Lab Results  Component Value Date   WBC 10.1 08/20/2023   HGB 8.6 (L) 08/20/2023   HCT 30.3 (L) 08/20/2023   MCV 65.4 (L) 08/20/2023   PLT 367 08/20/2023    BMET    Component Value Date/Time   NA 141 08/20/2023 0857   NA 143 11/11/2022 0950   K 3.8 08/20/2023 0857   CL 105 08/20/2023 0857   CO2 24 08/20/2023 0857   GLUCOSE 168 (H) 08/20/2023 0857   BUN 35 (H) 08/20/2023 0857   BUN 17 11/11/2022 0950   CREATININE 1.39 (H) 08/20/2023 0857   CREATININE 0.78 04/20/2017 0914   CALCIUM 8.8 (L) 08/20/2023 0857   GFRNONAA 37 (L) 08/20/2023 0857   GFRNONAA 72 04/20/2017 0914   GFRAA >60 03/07/2020 1440   GFRAA 83 04/20/2017 0914   CrCl cannot be calculated (Patient's most recent lab result is older than the maximum 21 days allowed.).  COAG Lab Results  Component Value Date   INR 1.0 10/31/2019    Radiology US Pelvic Complete With Transvaginal Result Date: 08/31/2023 CLINICAL DATA:  Vaginal bleeding. EXAM: TRANSABDOMINAL AND TRANSVAGINAL ULTRASOUND OF PELVIS TECHNIQUE: Both transabdominal and transvaginal ultrasound examinations of the pelvis were performed. Transabdominal technique was performed for global imaging of the pelvis including uterus, ovaries, adnexal regions, and pelvic cul-de-sac. It was necessary to proceed with endovaginal exam following the transabdominal exam to visualize the uterus, endometrium and right ovary. COMPARISON:  None Available. FINDINGS: Uterus Measurements: 6.4 cm x 3.1 cm x 4.3 cm = volume: 44.81 mL. No fibroids or other mass  visualized. Endometrium Thickness: 7.9 mm. A very small amount of fluid is seen within the endometrial canal. Right ovary Measurements: 1.5 cm x 1.5 cm x 1.9 cm = volume: 2.17 mL. Normal appearance/no adnexal mass. Left ovary The left ovary is not visualized. Other findings No abnormal free fluid. IMPRESSION: 1. Very small amount of fluid suspected within the endometrial canal. 2. Nonvisualization of the left ovary. Electronically Signed   By: Aram Candela M.D.   On: 08/31/2023 18:25     Assessment/Plan 1. Lymphedema (Primary) Recommend:  No surgery or intervention at this point in time.    I have reviewed my discussion with the patient regarding lymphedema and why it  causes symptoms.  Patient will continue wearing graduated compression on a daily basis. The patient should put the compression  on first thing in the morning and removing them in the evening. The patient should not sleep in the compression.   In addition, behavioral modification throughout the day will be continued.  This will include frequent elevation (such as in a recliner), use of over the counter pain medications as needed and exercise such as walking.  The systemic causes for chronic edema such as liver, kidney and cardiac etiologies does not appear to have significant changed over the past year.    The patient will continue aggressive use of the  lymph pump.  This will continue to improve the edema control and prevent sequela such as ulcers and infections.   The patient will follow-up with me on an annual basis.   2. Mixed hyperlipidemia Continue statin as ordered and reviewed, no changes at this time  3. Arthritis Continue medications to treat the patient's degenerative disease as already ordered, these medications have been reviewed and there are no changes at this time.  Continued activity and therapy was stressed.  4. Benign essential HTN Continue antihypertensive medications as already ordered, these  medications have been reviewed and there are no changes at this time.    Levora Dredge, MD  09/13/2023 9:48 AM

## 2023-09-14 ENCOUNTER — Telehealth: Payer: Self-pay | Admitting: Family Medicine

## 2023-09-14 NOTE — Telephone Encounter (Signed)
 Left voicemail that provider will be out of office 3/27 and appt has to be rescheduled

## 2023-09-16 ENCOUNTER — Encounter (INDEPENDENT_AMBULATORY_CARE_PROVIDER_SITE_OTHER): Payer: Self-pay | Admitting: Vascular Surgery

## 2023-09-16 ENCOUNTER — Ambulatory Visit (INDEPENDENT_AMBULATORY_CARE_PROVIDER_SITE_OTHER): Payer: PPO | Admitting: Vascular Surgery

## 2023-09-16 VITALS — BP 124/70 | HR 73 | Resp 18 | Ht 60.0 in | Wt 124.8 lb

## 2023-09-16 DIAGNOSIS — I10 Essential (primary) hypertension: Secondary | ICD-10-CM

## 2023-09-16 DIAGNOSIS — I890 Lymphedema, not elsewhere classified: Secondary | ICD-10-CM

## 2023-09-16 DIAGNOSIS — M1990 Unspecified osteoarthritis, unspecified site: Secondary | ICD-10-CM

## 2023-09-16 DIAGNOSIS — E782 Mixed hyperlipidemia: Secondary | ICD-10-CM | POA: Diagnosis not present

## 2023-09-23 ENCOUNTER — Encounter: Payer: PPO | Admitting: Family Medicine

## 2023-09-27 ENCOUNTER — Other Ambulatory Visit (HOSPITAL_COMMUNITY): Payer: Self-pay

## 2023-09-27 ENCOUNTER — Encounter: Payer: Self-pay | Admitting: Family Medicine

## 2023-09-27 ENCOUNTER — Other Ambulatory Visit: Payer: Self-pay

## 2023-09-27 ENCOUNTER — Ambulatory Visit (INDEPENDENT_AMBULATORY_CARE_PROVIDER_SITE_OTHER): Admitting: Family Medicine

## 2023-09-27 VITALS — BP 124/60 | HR 66 | Ht 60.0 in | Wt 123.0 lb

## 2023-09-27 DIAGNOSIS — I10 Essential (primary) hypertension: Secondary | ICD-10-CM | POA: Diagnosis not present

## 2023-09-27 DIAGNOSIS — F331 Major depressive disorder, recurrent, moderate: Secondary | ICD-10-CM | POA: Diagnosis not present

## 2023-09-27 DIAGNOSIS — E782 Mixed hyperlipidemia: Secondary | ICD-10-CM

## 2023-09-27 DIAGNOSIS — C9110 Chronic lymphocytic leukemia of B-cell type not having achieved remission: Secondary | ICD-10-CM

## 2023-09-27 DIAGNOSIS — Z0000 Encounter for general adult medical examination without abnormal findings: Secondary | ICD-10-CM | POA: Diagnosis not present

## 2023-09-27 NOTE — Progress Notes (Signed)
 Complete physical exam   Patient: Barbara Chambers   DOB: August 07, 1934   89 y.o. Female  MRN: [id number] Visit Date: 09/27/2023  Today's healthcare provider: Shirlee Latch, MD   Chief Complaint  Patient presents with   Follow-up    3 week follow-up for post-menopausal bleeding.     Annual Exam    No concerns    Subjective    Barbara Chambers is a 89 y.o. female who presents today for a complete physical exam.   Discussed the use of AI scribe software for clinical note transcription with the patient, who gave verbal consent to proceed.  History of Present Illness   The patient, with a history of anemia and on medication for cholesterol and mood, presented for a physical examination. The patient's blood pressure was initially low but normalized upon rechecking. The patient has been stable with no significant weight loss or gain. The patient is also on medication for an unspecified condition, possibly cancer, and is due to see another doctor for a discussion about continuing the medication.        Last depression screening scores    08/31/2023    9:45 AM 10/13/2022    8:59 AM 12/04/2021   10:24 AM  PHQ 2/9 Scores  PHQ - 2 Score 0 0 0  PHQ- 9 Score 0  0   Last fall risk screening    10/13/2022    8:56 AM  Fall Risk   Falls in the past year? 0  Number falls in past yr: 0  Injury with Fall? 0  Risk for fall due to : No Fall Risks  Follow up Education provided;Falls prevention discussed        Medications: Outpatient Medications Prior to Visit  Medication Sig   atorvastatin (LIPITOR) 40 MG tablet Take 40 mg by mouth daily.   COENZYME Q-10 PO Take 200 mg by mouth daily.    DULoxetine (CYMBALTA) 20 MG capsule Take 1 capsule (20 mg total) by mouth daily.   furosemide (LASIX) 20 MG tablet Take 1 tablet by mouth daily.   ibrutinib (IMBRUVICA) 420 MG tablet TAKE 1 TABLET BY MOUTH DAILY.   loperamide (IMODIUM) 1 MG/5ML solution Take 2 mg by mouth 4 (four) times daily as  needed for diarrhea or loose stools.   metoprolol succinate (TOPROL-XL) 50 MG 24 hr tablet Take 1 tablet (50 mg total) by mouth daily. Take with or immediately following a meal.   OLANZapine (ZYPREXA) 2.5 MG tablet TAKE 1 TABLET BY MOUTH EVERYDAY AT BEDTIME   Potassium Chloride ER 20 MEQ TBCR Take 1 tablet by mouth daily.   rivastigmine (EXELON) 1.5 MG capsule Take 1.5 mg by mouth 2 (two) times daily.   SODIUM FLUORIDE 5000 PPM 1.1 % PSTE See admin instructions.   telmisartan (MICARDIS) 40 MG tablet TAKE 2 TABLETS BY MOUTH EVERY DAY   vitamin B-12 (CYANOCOBALAMIN) 1000 MCG tablet Take 1,000 mcg by mouth daily.   No facility-administered medications prior to visit.    Review of Systems    Objective    BP 124/60 (BP Location: Left Arm, Patient Position: Sitting, Cuff Size: Normal)   Pulse 66   Ht 5' (1.524 m)   Wt 123 lb (55.8 kg)   SpO2 100%   BMI 24.02 kg/m    Physical Exam Vitals reviewed.  Constitutional:      General: She is not in acute distress.    Appearance: Normal appearance. She is well-developed. She is not  diaphoretic.  HENT:     Head: Normocephalic and atraumatic.     Right Ear: External ear normal.     Left Ear: External ear normal.     Nose: Nose normal.     Mouth/Throat:     Mouth: Mucous membranes are moist.     Pharynx: Oropharynx is clear. No oropharyngeal exudate.  Eyes:     General: No scleral icterus.    Conjunctiva/sclera: Conjunctivae normal.     Pupils: Pupils are equal, round, and reactive to light.  Neck:     Thyroid: No thyromegaly.  Cardiovascular:     Rate and Rhythm: Normal rate and regular rhythm.     Heart sounds: Murmur heard.  Pulmonary:     Effort: Pulmonary effort is normal. No respiratory distress.     Breath sounds: Normal breath sounds. No wheezing or rales.  Abdominal:     General: There is no distension.     Palpations: Abdomen is soft.     Tenderness: There is no abdominal tenderness.  Musculoskeletal:        General:  No deformity.     Cervical back: Neck supple.     Right lower leg: Edema present.     Left lower leg: Edema present.  Lymphadenopathy:     Cervical: No cervical adenopathy.  Skin:    General: Skin is warm and dry.  Neurological:     Mental Status: She is alert. Mental status is at baseline.  Psychiatric:        Mood and Affect: Mood normal.      No results found for any visits on 09/27/23.  Assessment & Plan    Routine Health Maintenance and Physical Exam  Exercise Activities and Dietary recommendations  Goals       "I need to make sure mom follows up with a psychiatrist" (pt-stated)      Current Barriers:  Mental Health Concerns   Clinical Social Work Clinical Goal(s):  Over the next 30 days, client will follow up with [facility] as directed by patient's provider to address continued delusion that bed bugs remain in her home  Interventions: [name] who is on DPR  interviewed and appropriate assessments performed Followed up on  referral to [facility] to address delusional disorder Confirmed that patient appointment scheduled for 03/28/19 with Yvette Rack ANP-C for medication assessment was kept Patient's EMR confirmed that patient does not have a UTI and she can now stop the antibiotics Encouraged patient's son to continue to support patient with ongoing mental health follow up with [facility] for medication management. [name] provided  with direct contact information for this social worker in the event that any community resource needs arise   Patient Self Care Activities:  Attends all scheduled provider appointments Performs ADL's independently Unable to perform IADLs independently  Please see past updates related to this goal by clicking on the "Past Updates" button in the selected goal        DIET - EAT MORE FRUITS AND VEGETABLES      Exercise      Starting 06/24/16, I will start back walking 4 days a week for  45 minutes.      LIFESTYLE - DECREASE FALLS RISK      Recommend to drink at least 6-8 8oz glasses of water per day.       Prevent falls      Recommend to remove any items from the home that may cause slips or  trips.      Stabilization of disease      Patient is on track. Patient will maintain adherence       Track and Manage My Blood Pressure-Hypertension      Timeframe:  Long-Range Goal Priority:  High Start Date: 09/23/21                             Expected End Date: 09/24/22                      Follow Up within 90 days   - check blood pressure 3 times per week    Why is this important?   You won't feel high blood pressure, but it can still hurt your blood vessels.  High blood pressure can cause heart or kidney problems. It can also cause a stroke.  Making lifestyle changes like losing a little weight or eating less salt will help.  Checking your blood pressure at home and at different times of the day can help to control blood pressure.  If the doctor prescribes medicine remember to take it the way the doctor ordered.  Call the office if you cannot afford the medicine or if there are questions about it.     Notes:         Immunization History  Administered Date(s) Administered   Fluad Quad(high Dose 65+) 03/17/2019, 03/07/2020, 04/25/2021   Influenza, High Dose Seasonal PF 06/24/2016, 04/20/2017, 05/04/2018   PFIZER Comirnaty(Gray Top)Covid-19 Tri-Sucrose Vaccine 11/19/2020   PFIZER(Purple Top)SARS-COV-2 Vaccination 09/02/2019, 03/07/2020   Pneumococcal Conjugate-13 03/30/2014   Pneumococcal Polysaccharide-23 04/20/2017   Td 04/05/2017   Tdap 07/14/2005    Health Maintenance  Topic Date Due   Zoster Vaccines- Shingrix (1 of 2) Never done   COVID-19 Vaccine (4 - 2024-25 season) 02/28/2023   Medicare Annual Wellness (AWV)  10/13/2023   INFLUENZA VACCINE  09/27/2023 (Originally 01/28/2023)   DTaP/Tdap/Td (3 - Td or Tdap) 04/06/2027   Pneumonia Vaccine 65+ Years  old  Completed   DEXA SCAN  Completed   HPV VACCINES  Aged Out    Discussed health benefits of physical activity, and encouraged her to engage in regular exercise appropriate for her age and condition.  Problem List Items Addressed This Visit       Cardiovascular and Mediastinum   Benign essential HTN     Other   CLL (chronic lymphocytic leukemia) (HCC)   HLD (hyperlipidemia)   Major depressive disorder, recurrent episode, moderate (HCC)   Other Visit Diagnoses       Encounter for annual physical exam    -  Primary       Assessment and Plan    Hypotension Initial blood pressure was low at 97/30 mmHg, potentially causing dizziness. A manual recheck showed a satisfactory reading of 124/60 mmHg, suggesting the initial low reading may have been due to equipment error or improper cuff size. - Monitor blood pressure regularly - Continue metoprolol and telmisartan  Anemia Hemoglobin levels range from 8.6 to low 9s, consistent with her baseline. - Continue routine monitoring of hemoglobin levels  Lymphedema Lymphedema with lower extremity swelling. Daily use of lymphedema pumps provides relief and is well-tolerated. - Continue daily use of lymphedema pumps  General Health Maintenance Up to date on tetanus and pneumonia vaccinations. Shingles vaccination status is uncertain; recommended to verify with pharmacy. Mammograms and colon cancer screenings are no longer necessary due to age  and risk-benefit balance. Shingles vaccine is about 97% effective, advisable if not previously administered. - Verify shingles vaccination status with pharmacy - Consider shingles vaccination if not previously administered  Goals of Care Plan to discuss continuation of Imbruvica with Dr. Orlie Dakin, focusing on treatment goals and reassessment of benefits and risks. - Discuss Imbruvica treatment plan with Dr. Orlie Dakin within the month  Follow-up Ensure continued stability of her health  conditions. - Schedule follow-up appointment in six months          Return in about 6 months (around 03/28/2024) for chronic disease f/u.     Shirlee Latch, MD  [facility] [phone] (phone) [phone] (fax)  [facility]

## 2023-09-27 NOTE — Progress Notes (Signed)
 Specialty Pharmacy Refill Coordination Note  Barbara Chambers is a 89 y.o. female contacted today regarding refills of specialty medication(s) Ibrutinib [name])   Patient requested Delivery   Delivery date: 09/30/23   Verified address: [address]   [place] [address]   Medication will be filled on 09/29/23.

## 2023-09-30 ENCOUNTER — Other Ambulatory Visit: Payer: Self-pay

## 2023-10-05 DIAGNOSIS — R600 Localized edema: Secondary | ICD-10-CM | POA: Diagnosis not present

## 2023-10-05 DIAGNOSIS — E782 Mixed hyperlipidemia: Secondary | ICD-10-CM | POA: Diagnosis not present

## 2023-10-05 DIAGNOSIS — I10 Essential (primary) hypertension: Secondary | ICD-10-CM | POA: Diagnosis not present

## 2023-10-11 ENCOUNTER — Ambulatory Visit: Admitting: Urology

## 2023-10-18 ENCOUNTER — Ambulatory Visit: Payer: PPO

## 2023-10-18 VITALS — Ht 61.0 in | Wt 125.0 lb

## 2023-10-18 DIAGNOSIS — Z0000 Encounter for general adult medical examination without abnormal findings: Secondary | ICD-10-CM | POA: Diagnosis not present

## 2023-10-18 NOTE — Progress Notes (Signed)
 Because this visit was a virtual/telehealth visit,  certain criteria was not obtained, such a blood pressure, CBG if applicable, and timed get up and go. Any medications not marked as "taking" were not mentioned during the medication reconciliation part of the visit. Any vitals not documented were not able to be obtained due to this being a telehealth visit or patient was unable to self-report a recent blood pressure reading due to a lack of equipment at home via telehealth. Vitals that have been documented are verbally provided by the patient.   Subjective:   Barbara Chambers is a 89 y.o. who presents for a Medicare Wellness preventive visit.  Visit Complete: Virtual I connected with  Barbara Chambers on 10/18/23 by a audio enabled telemedicine application and verified that I am speaking with the correct person using two identifiers.  Patient Location: Home  Provider Location: Office/Clinic  I discussed the limitations of evaluation and management by telemedicine. The patient expressed understanding and agreed to proceed.  Vital Signs: Because this visit was a virtual/telehealth visit, some criteria may be missing or patient reported. Any vitals not documented were not able to be obtained and vitals that have been documented are patient reported.  VideoDeclined- This patient declined Librarian, academic. Therefore the visit was completed with audio only.  Persons Participating in Visit: Patient.  AWV Questionnaire: No: Patient Medicare AWV questionnaire was not completed prior to this visit.  Cardiac Risk Factors include: dyslipidemia;hypertension;family history of premature cardiovascular disease;advanced age (>62men, >92 women)     Objective:    Today's Vitals   10/18/23 1425  Weight: 125 lb (56.7 kg)  Height: 5\' 1"  (1.549 m)  PainSc: 0-No pain   Body mass index is 23.62 kg/m.     10/18/2023    2:28 PM 11/16/2022   11:23 AM 10/13/2022    9:05 AM  05/13/2022   11:35 AM 11/04/2021    1:53 PM 10/08/2021    9:05 AM 09/02/2021   10:39 AM  Advanced Directives  Does Patient Have a Medical Advance Directive? Yes Yes Yes Yes Yes No Yes  Type of Estate agent of [place];Living will Healthcare Power of [place];Living will  Healthcare Power of [place];Living will Healthcare Power of [place];Living will    Does patient want to make changes to medical advance directive?       No - Patient declined  Copy of Healthcare Power of Attorney in Chart? No - copy requested No - copy requested       Would patient like information on creating a medical advance directive?      No - Patient declined No - Patient declined    Current Medications (verified) Outpatient Encounter Medications as of 10/18/2023  Medication Sig   atorvastatin  (LIPITOR) 40 MG tablet Take 40 mg by mouth daily.   COENZYME Q-10 PO Take 200 mg by mouth daily.    DULoxetine  (CYMBALTA ) 20 MG capsule Take 1 capsule (20 mg total) by mouth daily.   furosemide (LASIX) 20 MG tablet Take 1 tablet by mouth daily.   ibrutinib  (IMBRUVICA ) 420 MG tablet TAKE 1 TABLET BY MOUTH DAILY.   loperamide (IMODIUM) 1 MG/5ML solution Take 2 mg by mouth 4 (four) times daily as needed for diarrhea or loose stools.   metoprolol  succinate (TOPROL -XL) 50 MG 24 hr tablet Take 1 tablet (50 mg total) by mouth daily. Take with or immediately following a meal.   OLANZapine  (ZYPREXA ) 2.5 MG tablet TAKE 1 TABLET BY  MOUTH EVERYDAY AT BEDTIME   Potassium Chloride  ER 20 MEQ TBCR Take 1 tablet by mouth daily.   rivastigmine (EXELON) 1.5 MG capsule Take 1.5 mg by mouth 2 (two) times daily.   SODIUM FLUORIDE 5000 PPM 1.1 % PSTE See admin instructions.   telmisartan  (MICARDIS ) 40 MG tablet TAKE 2 TABLETS BY MOUTH EVERY DAY   vitamin B-12 (CYANOCOBALAMIN) 1000 MCG tablet Take 1,000 mcg by mouth daily.   No facility-administered encounter medications on file as of 10/18/2023.    Allergies (verified) Hctz  [hydrochlorothiazide ] and Penicillins   History: Past Medical History:  Diagnosis Date   Chronic lymphocytic leukemia (HCC) 2011   Hyperlipidemia    Hypertension    Past Surgical History:  Procedure Laterality Date   cataract surgery Bilateral 2005   CHOLECYSTECTOMY  1991   RECONSTRUCTION OF EYELID     Family History  Problem Relation Age of Onset   Hypertension Mother    Stroke Mother    CAD Mother    Hypertension Father    Heart attack Father    Hypertension Brother    Seizures Brother    Hypertension Son    Breast cancer Neg Hx    Social History   Socioeconomic History   Marital status: Widowed    Spouse name: Ardeen Kohler   Number of children: 3   Years of education: Not on file   Highest education level: 12th grade  Occupational History   Occupation: retired  Tobacco Use   Smoking status: Never   Smokeless tobacco: Never  Vaping Use   Vaping status: Never Used  Substance and Sexual Activity   Alcohol use: No   Drug use: No   Sexual activity: Not on file  Other Topics Concern   Not on file  Social History Narrative   Pt has 1 son that was killed in a car accident at age 48.    Son, Lavonia Powers is primary caregiver.   Patient lives alone, son comes by everyday before he goes to work.   Social Drivers of Corporate investment banker Strain: Low Risk  (10/18/2023)   Overall Financial Resource Strain (CARDIA)    Difficulty of Paying Living Expenses: Not hard at all  Food Insecurity: No Food Insecurity (10/18/2023)   Hunger Vital Sign    Worried About Running Out of Food in the Last Year: Never true    Ran Out of Food in the Last Year: Never true  Transportation Needs: No Transportation Needs (10/18/2023)   PRAPARE - Administrator, Civil Service (Medical): No    Lack of Transportation (Non-Medical): No  Physical Activity: Sufficiently Active (10/18/2023)   Exercise Vital Sign    Days of Exercise per Week: 5 days    Minutes of Exercise per Session: 30 min   Stress: No Stress Concern Present (10/18/2023)   [employer] of Occupational Health - Occupational Stress Questionnaire    Feeling of Stress : Not at all  Social Connections: Moderately Isolated (10/18/2023)   Social Connection and Isolation Panel [NHANES]    Frequency of Communication with Friends and Family: More than three times a week    Frequency of Social Gatherings with Friends and Family: Once a week    Attends Religious Services: More than 4 times per year    Active Member of [employer] or Organizations: No    Attends Banker Meetings: Never    Marital Status: Widowed    Tobacco Counseling Counseling given: Not Answered  Clinical Intake:  Pre-visit preparation completed: Yes  Pain : No/denies pain Pain Score: 0-No pain     BMI - recorded: 23.62 Nutritional Risks: None Diabetes: No  Lab Results  Component Value Date   HGBA1C 6.3 (H) 12/04/2021     How often do you need to have someone help you when you read instructions, pamphlets, or other written materials from your doctor or pharmacy?: 1 - Never  Interpreter Needed?: No  Information entered by :: Jaia Alonge N. Minha Fulco, LPN.   Activities of Daily Living     10/18/2023    2:44 PM 09/27/2023    1:15 PM  In your present state of health, do you have any difficulty performing the following activities:  Hearing? 1 1  Vision? 1 1  Difficulty concentrating or making decisions? 1 1  Walking or climbing stairs? 0 0  Dressing or bathing? 0 0  Doing errands, shopping? 1 0  Comment Patient stated that she inly drives to church which is near her home.   Preparing Food and eating ? N N  Using the Toilet? N N  In the past six months, have you accidently leaked urine? Y N  Comment Patient wears protective underwear.   Do you have problems with loss of bowel control? Y N  Managing your Medications? Y N  Managing your Finances? Y N  Housekeeping or managing your Housekeeping? Y N    Patient Care  Team: Mazie Speed, MD as PCP - General (Family Medicine) Shellie Dials, MD as Consulting Physician (Oncology) Michelle Aid, MD as Consulting Physician (Cardiology) Clair Crews, MD as Referring Physician (Ophthalmology)  Indicate any recent Medical Services you may have received from other than [facility] providers in the past year (date may be approximate).     Assessment:   This is a routine wellness examination for Barbara Chambers.  Hearing/Vision screen Hearing Screening - Comments:: Patient has difficulty hearing by observation. No hearing aids. Vision Screening - Comments:: Wears rx glasses - up to date with routine eye exams with [facility].    Goals Addressed             This Visit's Progress    Client understands the importance of follow-up with providers by attending scheduled visits         Depression Screen     08/31/2023    9:45 AM 10/13/2022    8:59 AM 12/04/2021   10:24 AM 10/08/2021    9:04 AM 04/25/2021   11:26 AM 02/07/2021    9:49 AM 10/03/2020    8:27 AM  PHQ 2/9 Scores  PHQ - 2 Score 0 0 0 0 0 0 0  PHQ- 9 Score 0  0  0      Fall Risk     10/18/2023    2:44 PM 10/13/2022    8:56 AM 10/08/2021    9:06 AM 02/07/2021    9:58 AM 10/03/2020    8:30 AM  Fall Risk   Falls in the past year? 0 0 0 0 1  Number falls in past yr: 0 0 0 0 0  Injury with Fall? 0 0 0  0  Risk for fall due to : No Fall Risks No Fall Risks No Fall Risks Medication side effect   Follow up Falls prevention discussed;Falls evaluation completed Education provided;Falls prevention discussed Falls evaluation completed Falls prevention discussed Falls prevention discussed    MEDICARE RISK AT HOME:  Medicare Risk at Home Any stairs in  or around the home?: No If so, are there any without handrails?: No Home free of loose throw rugs in walkways, pet beds, electrical cords, etc?: Yes Adequate lighting in your home to reduce risk of falls?: Yes Life alert?: No Use of a cane,  walker or w/c?: No Grab bars in the bathroom?: Yes Shower chair or bench in shower?: Yes Elevated toilet seat or a handicapped toilet?: Yes  TIMED UP AND GO:  Was the test performed?  No  Cognitive Function: Patient has current diagnosis of cognitive impairment. Patient is followed by neurology for ongoing assessment. Patient is unable to complete screening 6CIT or MMSE.      10/18/2023    2:29 PM  MMSE - Mini Mental State Exam  Not completed: Unable to complete        10/13/2022    9:09 AM 12/04/2021   10:25 AM 01/20/2019   11:50 AM 06/24/2016    9:13 AM  6CIT Screen  What Year? 0 points 0 points 0 points 0 points  What month? 0 points 0 points 0 points 0 points  What time? 0 points 0 points 0 points 0 points  Count back from 20 0 points 0 points 0 points 0 points  Months in reverse 4 points 0 points 0 points 0 points  Repeat phrase 2 points 4 points 0 points 2 points  Total Score 6 points 4 points 0 points 2 points    Immunizations Immunization History  Administered Date(s) Administered   Fluad Quad(high Dose 65+) 03/17/2019, 03/07/2020, 04/25/2021   Influenza, High Dose Seasonal PF 06/24/2016, 04/20/2017, 05/04/2018   PFIZER Comirnaty(Gray Top)Covid-19 Tri-Sucrose Vaccine 11/19/2020   PFIZER(Purple Top)SARS-COV-2 Vaccination 09/02/2019, 03/07/2020   Pneumococcal Conjugate-13 03/30/2014   Pneumococcal Polysaccharide-23 04/20/2017   Td 04/05/2017   Tdap 07/14/2005   Zoster Recombinant(Shingrix) 07/16/2019, 10/05/2019    Screening Tests Health Maintenance  Topic Date Due   COVID-19 Vaccine (4 - 2024-25 season) 02/28/2023   INFLUENZA VACCINE  01/28/2024   Medicare Annual Wellness (AWV)  10/17/2024   DTaP/Tdap/Td (3 - Td or Tdap) 04/06/2027   Pneumonia Vaccine 86+ Years old  Completed   DEXA SCAN  Completed   Zoster Vaccines- Shingrix  Completed   HPV VACCINES  Aged Out   Meningococcal B Vaccine  Aged Out    Health Maintenance  Health Maintenance Due  Topic  Date Due   COVID-19 Vaccine (4 - 2024-25 season) 02/28/2023   Health Maintenance Items Addressed: Yes Patient is up to date with health care maintenance.  Additional Screening:  Vision Screening: Recommended annual ophthalmology exams for early detection of glaucoma and other disorders of the eye.  Dental Screening: Recommended annual dental exams for proper oral hygiene  Community Resource Referral / Chronic Care Management: CRR required this visit?  No   CCM required this visit?  No     Plan:     I have personally reviewed and noted the following in the patient's chart:   Medical and social history Use of alcohol, tobacco or illicit drugs  Current medications and supplements including opioid prescriptions. Patient is not currently taking opioid prescriptions. Functional ability and status Nutritional status Physical activity Advanced directives List of other physicians Hospitalizations, surgeries, and ER visits in previous 12 months Vitals Screenings to include cognitive, depression, and falls Referrals and appointments  In addition, I have reviewed and discussed with patient certain preventive protocols, quality metrics, and best practice recommendations. A written personalized care plan for preventive services as well as  general preventive health recommendations were provided to patient.     Margette Sheldon, LPN   1/61/0960   After Visit Summary: (Declined) Due to this being a telephonic visit, with patients personalized plan was offered to patient but patient Declined AVS at this time   Notes: Nothing significant to report at this time.

## 2023-10-18 NOTE — Patient Instructions (Addendum)
 Barbara Chambers , Thank you for taking time to come for your Medicare Wellness Visit. I appreciate your ongoing commitment to your health goals. Please review the following plan we discussed and let me know if I can assist you in the future.   Referrals/Orders/Follow-Ups/Clinician Recommendations: Keep maintaining your health by keeping your appointments with Dr.  Angela [name] and any specialists that you may see.  Call us  if you need anything.  Have a great year!!!!  This is a list of the screening recommended for you and due dates:  Health Maintenance  Topic Date Due   COVID-19 Vaccine (4 - 2024-25 season) 02/28/2023   Flu Shot  01/28/2024   Medicare Annual Wellness Visit  10/17/2024   DTaP/Tdap/Td vaccine (3 - Td or Tdap) 04/06/2027   Pneumonia Vaccine  Completed   DEXA scan (bone density measurement)  Completed   Zoster (Shingles) Vaccine  Completed   HPV Vaccine  Aged Out   Meningitis B Vaccine  Aged Out    Advanced directives: (Declined) Advance directive discussed with you today. Even though you declined this today, please call our office should you change your mind, and we can give you the proper paperwork for you to fill out.  Next Medicare Annual Wellness Visit scheduled for next year: Yes, It was nice speaking with you today! Your next Annual Wellness Visit is scheduled for 10/24/2024 at 2:30 p.m. via PHONE VISIT. If you need to reschedule or cancel, please call [phone].

## 2023-10-25 ENCOUNTER — Other Ambulatory Visit: Payer: Self-pay | Admitting: Family Medicine

## 2023-10-25 DIAGNOSIS — I10 Essential (primary) hypertension: Secondary | ICD-10-CM

## 2023-10-26 ENCOUNTER — Other Ambulatory Visit (HOSPITAL_COMMUNITY): Payer: Self-pay

## 2023-10-26 ENCOUNTER — Other Ambulatory Visit: Payer: Self-pay

## 2023-10-26 NOTE — Telephone Encounter (Signed)
 Requested Prescriptions  Pending Prescriptions Disp Refills   telmisartan  (MICARDIS ) 40 MG tablet [Pharmacy Med Name: TELMISARTAN  40 MG TABLET] 180 tablet 0    Sig: TAKE 2 TABLETS BY MOUTH EVERY DAY     Cardiovascular:  Angiotensin Receptor Blockers Failed - 10/26/2023  3:28 PM      Failed - Cr in normal range and within 180 days    Creatinine  Date Value Ref Range Status  08/20/2023 1.39 (H) 0.44 - 1.00 mg/dL Final   Creat  Date Value Ref Range Status  04/20/2017 0.78 0.60 - 0.88 mg/dL Final    Comment:    For patients >10 years of age, the reference limit for Creatinine is approximately 13% higher for people identified as African-American. .          Passed - K in normal range and within 180 days    Potassium  Date Value Ref Range Status  08/20/2023 3.8 3.5 - 5.1 mmol/L Final         Passed - Patient is not pregnant      Passed - Last BP in normal range    BP Readings from Last 1 Encounters:  09/27/23 124/60         Passed - Valid encounter within last 6 months    Recent Outpatient Visits           4 weeks ago Encounter for annual physical exam   [facility] [place], Stan Eans, MD   1 month ago Postmenopausal vaginal bleeding   [facility] [place], Waelder, MD   2 months ago Functional diarrhea   [facility] [place], Stan Eans, MD

## 2023-10-26 NOTE — Progress Notes (Signed)
 Specialty Pharmacy Refill Coordination Note  Barbara Chambers is a 89 y.o. female contacted today regarding refills of specialty medication(s) Ibrutinib  (IMBRUVICA )   Patient requested Delivery   Delivery date: 10/29/23   Verified address: [address]   [place] [address]   Medication will be filled on 10/28/23.

## 2023-10-28 ENCOUNTER — Other Ambulatory Visit: Payer: Self-pay

## 2023-11-16 ENCOUNTER — Other Ambulatory Visit: Payer: Self-pay

## 2023-11-17 ENCOUNTER — Other Ambulatory Visit (HOSPITAL_COMMUNITY): Payer: Self-pay

## 2023-11-17 ENCOUNTER — Other Ambulatory Visit: Payer: Self-pay

## 2023-11-18 ENCOUNTER — Inpatient Hospital Stay: Payer: PPO | Attending: Oncology

## 2023-11-18 ENCOUNTER — Encounter: Payer: Self-pay | Admitting: Oncology

## 2023-11-18 ENCOUNTER — Inpatient Hospital Stay

## 2023-11-18 ENCOUNTER — Inpatient Hospital Stay: Payer: PPO | Admitting: Oncology

## 2023-11-18 VITALS — BP 132/52 | HR 66 | Temp 96.7°F | Resp 18 | Ht 61.0 in | Wt 125.2 lb

## 2023-11-18 DIAGNOSIS — N189 Chronic kidney disease, unspecified: Secondary | ICD-10-CM | POA: Diagnosis not present

## 2023-11-18 DIAGNOSIS — I129 Hypertensive chronic kidney disease with stage 1 through stage 4 chronic kidney disease, or unspecified chronic kidney disease: Secondary | ICD-10-CM | POA: Insufficient documentation

## 2023-11-18 DIAGNOSIS — C9110 Chronic lymphocytic leukemia of B-cell type not having achieved remission: Secondary | ICD-10-CM

## 2023-11-18 DIAGNOSIS — D649 Anemia, unspecified: Secondary | ICD-10-CM | POA: Insufficient documentation

## 2023-11-18 LAB — CBC WITH DIFFERENTIAL/PLATELET
Abs Immature Granulocytes: 0.11 10*3/uL — ABNORMAL HIGH (ref 0.00–0.07)
Basophils Absolute: 0.1 10*3/uL (ref 0.0–0.1)
Basophils Relative: 1 %
Eosinophils Absolute: 0 10*3/uL (ref 0.0–0.5)
Eosinophils Relative: 0 %
HCT: 23.7 % — ABNORMAL LOW (ref 36.0–46.0)
Hemoglobin: 6.7 g/dL — CL (ref 12.0–15.0)
Immature Granulocytes: 1 %
Lymphocytes Relative: 26 %
Lymphs Abs: 2.9 10*3/uL (ref 0.7–4.0)
MCH: 17.2 pg — ABNORMAL LOW (ref 26.0–34.0)
MCHC: 28.3 g/dL — ABNORMAL LOW (ref 30.0–36.0)
MCV: 60.8 fL — ABNORMAL LOW (ref 80.0–100.0)
Monocytes Absolute: 1.3 10*3/uL — ABNORMAL HIGH (ref 0.1–1.0)
Monocytes Relative: 11 %
Neutro Abs: 6.9 10*3/uL (ref 1.7–7.7)
Neutrophils Relative %: 61 %
Platelets: 302 10*3/uL (ref 150–400)
RBC: 3.9 MIL/uL (ref 3.87–5.11)
RDW: 18.6 % — ABNORMAL HIGH (ref 11.5–15.5)
WBC: 11.3 10*3/uL — ABNORMAL HIGH (ref 4.0–10.5)
nRBC: 0.4 % — ABNORMAL HIGH (ref 0.0–0.2)

## 2023-11-18 LAB — CMP (CANCER CENTER ONLY)
ALT: 13 U/L (ref 0–44)
AST: 20 U/L (ref 15–41)
Albumin: 3.2 g/dL — ABNORMAL LOW (ref 3.5–5.0)
Alkaline Phosphatase: 76 U/L (ref 38–126)
Anion gap: 8 (ref 5–15)
BUN: 33 mg/dL — ABNORMAL HIGH (ref 8–23)
CO2: 24 mmol/L (ref 22–32)
Calcium: 8.2 mg/dL — ABNORMAL LOW (ref 8.9–10.3)
Chloride: 108 mmol/L (ref 98–111)
Creatinine: 1.51 mg/dL — ABNORMAL HIGH (ref 0.44–1.00)
GFR, Estimated: 33 mL/min — ABNORMAL LOW (ref >60)
Glucose, Bld: 101 mg/dL — ABNORMAL HIGH (ref 70–99)
Potassium: 4.1 mmol/L (ref 3.5–5.1)
Sodium: 140 mmol/L (ref 135–145)
Total Bilirubin: 0.7 mg/dL (ref 0.0–1.2)
Total Protein: 5.7 g/dL — ABNORMAL LOW (ref 6.5–8.1)

## 2023-11-18 LAB — PREPARE RBC (CROSSMATCH)

## 2023-11-18 LAB — ABO/RH: ABO/RH(D): O POS

## 2023-11-18 NOTE — Progress Notes (Signed)
 Son Lavonia Powers would like to speak with Dr. Raymondo Calin privately.  Swelling feet and ankles for years. Leg pumps help.

## 2023-11-18 NOTE — Progress Notes (Signed)
 [facility]  Telephone:([phone]  Fax:([phone]     Barbara Chambers DOB: 28-Apr-1935  MR#: [id number]  YQM#:[id number]  Patient Care Team: Mazie Speed, MD as PCP - General (Family Medicine) Shellie Dials, MD as Consulting Physician (Oncology) Michelle Aid, MD as Consulting Physician (Cardiology) Clair Crews, MD as Referring Physician (Ophthalmology)   CHIEF COMPLAINT: CLL.  INTERVAL HISTORY: Patient returns to clinic today for repeat laboratory work and further evaluation.  She is accompanied by her son today who reports her dementia and memory are getting worse.  She currently feels well.  She does not complain of any weakness or fatigue.  She has no neurologic complaints. She denies any fevers, night sweats, or weight loss. She denies any chest pain, shortness of breath, cough, or hemoptysis.  She denies any nausea, vomiting, constipation, or diarrhea. She has no urinary complaints.  Patient offers no further specific complaints today.  REVIEW OF SYSTEMS:   Review of Systems  Constitutional: Negative.  Negative for diaphoresis, fever, malaise/fatigue and weight loss.  Respiratory: Negative.  Negative for cough and shortness of breath.   Cardiovascular: Negative.  Negative for chest pain and leg swelling.  Gastrointestinal: Negative.  Negative for abdominal pain.  Genitourinary: Negative.  Negative for dysuria.  Musculoskeletal: Negative.  Negative for back pain.  Skin: Negative.  Negative for rash.  Neurological: Negative.  Negative for dizziness, sensory change, focal weakness, weakness and headaches.  Psychiatric/Behavioral: Negative.  Negative for depression and memory loss. The patient is not nervous/anxious.     As per HPI. Otherwise, a complete review of systems is negative.  PAST MEDICAL HISTORY: Past Medical History:  Diagnosis Date   Chronic lymphocytic leukemia (HCC) 2011   Hyperlipidemia    Hypertension     PAST  SURGICAL HISTORY: Past Surgical History:  Procedure Laterality Date   cataract surgery Bilateral 2005   CHOLECYSTECTOMY  1991   RECONSTRUCTION OF EYELID      FAMILY HISTORY Family History  Problem Relation Age of Onset   Hypertension Mother    Stroke Mother    CAD Mother    Hypertension Father    Heart attack Father    Hypertension Brother    Seizures Brother    Hypertension Son    Breast cancer Neg Hx     GYNECOLOGIC HISTORY:  No LMP recorded. Patient is postmenopausal.     ADVANCED DIRECTIVES:    HEALTH MAINTENANCE: Social History   Tobacco Use   Smoking status: Never   Smokeless tobacco: Never  Vaping Use   Vaping status: Never Used  Substance Use Topics   Alcohol use: No   Drug use: No     Colonoscopy:  PAP:  Bone density:  Lipid panel:  Allergies  Allergen Reactions   Hctz [Hydrochlorothiazide ]     Hyponatremia   Penicillins Rash    Current Outpatient Medications  Medication Sig Dispense Refill   atorvastatin  (LIPITOR) 40 MG tablet Take 40 mg by mouth daily.     COENZYME Q-10 PO Take 200 mg by mouth daily.      DULoxetine  (CYMBALTA ) 20 MG capsule Take 1 capsule (20 mg total) by mouth daily. 90 capsule 1   furosemide (LASIX) 20 MG tablet Take 1 tablet by mouth daily.     ibrutinib  (IMBRUVICA ) 420 MG tablet TAKE 1 TABLET BY MOUTH DAILY. 28 tablet 5   loperamide (IMODIUM) 1 MG/5ML solution Take 2 mg by mouth 4 (four) times daily as needed for diarrhea  or loose stools.     metoprolol  succinate (TOPROL -XL) 50 MG 24 hr tablet Take 1 tablet (50 mg total) by mouth daily. Take with or immediately following a meal. 90 tablet 1   OLANZapine  (ZYPREXA ) 2.5 MG tablet TAKE 1 TABLET BY MOUTH EVERYDAY AT BEDTIME 90 tablet 1   Potassium Chloride  ER 20 MEQ TBCR Take 1 tablet by mouth daily.     rivastigmine (EXELON) 1.5 MG capsule Take 1.5 mg by mouth 2 (two) times daily.     SODIUM FLUORIDE 5000 PPM 1.1 % PSTE See admin instructions.     telmisartan  (MICARDIS ) 40  MG tablet TAKE 2 TABLETS BY MOUTH EVERY DAY 180 tablet 0   vitamin B-12 (CYANOCOBALAMIN) 1000 MCG tablet Take 1,000 mcg by mouth daily.     No current facility-administered medications for this visit.    OBJECTIVE: Ht 5\' 1"  (1.549 m)   Wt 125 lb 3.2 oz (56.8 kg)   BMI 23.66 kg/m    Body mass index is 23.66 kg/m.    ECOG FS:0 - Asymptomatic   General: Well-developed, well-nourished, no acute distress. Eyes: Pink conjunctiva, anicteric sclera. HEENT: Normocephalic, moist mucous membranes. Lungs: No audible wheezing or coughing. Heart: Regular rate and rhythm. Abdomen: Soft, nontender, no obvious distention. Musculoskeletal: No edema, cyanosis, or clubbing. Neuro: Alert, answering all questions appropriately. Cranial nerves grossly intact. Skin: No rashes or petechiae noted. Psych: Normal affect.  LAB RESULTS:  No visits with results within 3 Day(s) from this visit.  Latest known visit with results is:  Office Visit on 08/31/2023  Component Date Value Ref Range Status   Specific Gravity, UA 08/31/2023 1.010  1.005 - 1.030 Final   pH, UA 08/31/2023 6.5  5.0 - 7.5 Final   Color, UA 08/31/2023 Yellow  Yellow Final   Appearance Ur 08/31/2023 Clear  Clear Final   Leukocytes,UA 08/31/2023 1+ (A)  Negative Final   Protein,UA 08/31/2023 1+ (A)  Negative/Trace Final   Glucose, UA 08/31/2023 Negative  Negative Final   Ketones, UA 08/31/2023 Negative  Negative Final   RBC, UA 08/31/2023 3+ (A)  Negative Final   Bilirubin, UA 08/31/2023 Negative  Negative Final   Urobilinogen, Ur 08/31/2023 0.2  0.2 - 1.0 mg/dL Final   Nitrite, UA 16/03/9603 Negative  Negative Final   Microscopic Examination 08/31/2023 See below:   Final   Microscopic was indicated and was performed.   WBC, UA 08/31/2023 6-10 (A)  0 - 5 /hpf Final   RBC, Urine 08/31/2023 11-30 (A)  0 - 2 /hpf Final   Epithelial Cells (non renal) 08/31/2023 0-10  0 - 10 /hpf Final   Casts 08/31/2023 None seen  None seen /lpf Final    Bacteria, UA 08/31/2023 Few  None seen/Few Final   specimen status report 08/31/2023 Comment   Final   Comment: Please note Please note The date and/or time of collection was not indicated on the requisition as required by state and federal law.  The date of receipt of the specimen was used as the collection date if not supplied.     STUDIES: No results found.   ASSESSMENT: CLL.  PLAN:    CLL:  Patient initiated Imbruvica  in May 2021.  Her total white blood cell count remains mildly elevated ranging between 11.2 and 14.5 since June 2022.  Today's result is 11.3.  Her most recent imaging on Nov 09, 2022 reviewed independently with no significant lymphadenopathy noted.  No further imaging is necessary unless there is suspicion of  progression of disease.  Given patient's worsening dementia, will hold Imbruvica  for 1 month and then determine if future treatment is necessary.  Return to clinic in 1 month with repeat laboratory work and further evaluation.  Anemia: Hemoglobin has trended down to 6.7.  Return to clinic tomorrow for 1 unit packed red blood cells.   Renal insufficiency: Chronic and unchanged.  Patient creatinine is 1.51.  Patient expressed understanding and was in agreement with this plan. She also understands that She can call clinic at any time with any questions, concerns, or complaints.    Shellie Dials, MD   11/18/2023 9:36 AM

## 2023-11-19 ENCOUNTER — Other Ambulatory Visit: Payer: Self-pay

## 2023-11-19 ENCOUNTER — Other Ambulatory Visit (HOSPITAL_COMMUNITY): Payer: Self-pay

## 2023-11-19 ENCOUNTER — Inpatient Hospital Stay

## 2023-11-19 DIAGNOSIS — C9110 Chronic lymphocytic leukemia of B-cell type not having achieved remission: Secondary | ICD-10-CM | POA: Diagnosis not present

## 2023-11-19 MED ORDER — ACETAMINOPHEN 325 MG PO TABS
650.0000 mg | ORAL_TABLET | Freq: Once | ORAL | Status: AC
  Administered 2023-11-19: 650 mg via ORAL
  Filled 2023-11-19: qty 2

## 2023-11-19 MED ORDER — DIPHENHYDRAMINE HCL 50 MG/ML IJ SOLN
25.0000 mg | Freq: Once | INTRAMUSCULAR | Status: AC
  Administered 2023-11-19: 25 mg via INTRAVENOUS
  Filled 2023-11-19: qty 1

## 2023-11-19 MED ORDER — SODIUM CHLORIDE 0.9% IV SOLUTION
250.0000 mL | INTRAVENOUS | Status: DC
Start: 2023-11-19 — End: 2023-11-19
  Administered 2023-11-19: 250 mL via INTRAVENOUS
  Filled 2023-11-19: qty 250

## 2023-11-20 LAB — TYPE AND SCREEN
ABO/RH(D): O POS
Antibody Screen: NEGATIVE
Unit division: 0

## 2023-11-20 LAB — BPAM RBC
Blood Product Expiration Date: 202506182359
ISSUE DATE / TIME: 202505230945
Unit Type and Rh: 5100

## 2023-12-20 ENCOUNTER — Inpatient Hospital Stay (HOSPITAL_BASED_OUTPATIENT_CLINIC_OR_DEPARTMENT_OTHER): Admitting: Oncology

## 2023-12-20 ENCOUNTER — Inpatient Hospital Stay: Attending: Oncology

## 2023-12-20 ENCOUNTER — Encounter: Payer: Self-pay | Admitting: Oncology

## 2023-12-20 DIAGNOSIS — C9110 Chronic lymphocytic leukemia of B-cell type not having achieved remission: Secondary | ICD-10-CM | POA: Insufficient documentation

## 2023-12-20 LAB — CBC WITH DIFFERENTIAL/PLATELET
Abs Immature Granulocytes: 0.03 10*3/uL (ref 0.00–0.07)
Basophils Absolute: 0 10*3/uL (ref 0.0–0.1)
Basophils Relative: 0 %
Eosinophils Absolute: 0.1 10*3/uL (ref 0.0–0.5)
Eosinophils Relative: 1 %
HCT: 28.2 % — ABNORMAL LOW (ref 36.0–46.0)
Hemoglobin: 8.1 g/dL — ABNORMAL LOW (ref 12.0–15.0)
Immature Granulocytes: 0 %
Lymphocytes Relative: 30 %
Lymphs Abs: 2.4 10*3/uL (ref 0.7–4.0)
MCH: 19.1 pg — ABNORMAL LOW (ref 26.0–34.0)
MCHC: 28.7 g/dL — ABNORMAL LOW (ref 30.0–36.0)
MCV: 66.7 fL — ABNORMAL LOW (ref 80.0–100.0)
Monocytes Absolute: 0.6 10*3/uL (ref 0.1–1.0)
Monocytes Relative: 7 %
Neutro Abs: 4.9 10*3/uL (ref 1.7–7.7)
Neutrophils Relative %: 62 %
Platelets: 282 10*3/uL (ref 150–400)
RBC: 4.23 MIL/uL (ref 3.87–5.11)
RDW: 24.9 % — ABNORMAL HIGH (ref 11.5–15.5)
WBC: 8 10*3/uL (ref 4.0–10.5)
nRBC: 0 % (ref 0.0–0.2)

## 2023-12-20 LAB — SAMPLE TO BLOOD BANK

## 2023-12-20 NOTE — Progress Notes (Signed)
 [facility]  Telephone:([phone]  Fax:(336) [phone]     Barbara Chambers DOB: Aug 14, 1934  MR#: [id number]  RDW#:[id number]  Patient Care Team: Myrla Jon HERO, MD as PCP - General (Family Medicine) Jacobo Evalene PARAS, MD as Consulting Physician (Oncology) Hester Wolm PARAS, MD as Consulting Physician (Cardiology) Jaye Fallow, MD as Referring Physician (Ophthalmology)   CHIEF COMPLAINT: CLL.  INTERVAL HISTORY: Patient returns to clinic today for repeat laboratory work and further evaluation.  She currently feels well and is asymptomatic.  She continues to have declining memory.  She does not complain of any weakness or fatigue.  She has no neurologic complaints. She denies any fevers, night sweats, or weight loss. She denies any chest pain, shortness of breath, cough, or hemoptysis.  She denies any nausea, vomiting, constipation, or diarrhea. She has no urinary complaints.  Patient offers no further specific complaints today.  REVIEW OF SYSTEMS:   Review of Systems  Constitutional: Negative.  Negative for diaphoresis, fever, malaise/fatigue and weight loss.  Respiratory: Negative.  Negative for cough and shortness of breath.   Cardiovascular: Negative.  Negative for chest pain and leg swelling.  Gastrointestinal: Negative.  Negative for abdominal pain.  Genitourinary: Negative.  Negative for dysuria.  Musculoskeletal: Negative.  Negative for back pain.  Skin: Negative.  Negative for rash.  Neurological: Negative.  Negative for dizziness, sensory change, focal weakness, weakness and headaches.  Psychiatric/Behavioral:  Positive for memory loss. Negative for depression. The patient is not nervous/anxious.     As per HPI. Otherwise, a complete review of systems is negative.  PAST MEDICAL HISTORY: Past Medical History:  Diagnosis Date   Chronic lymphocytic leukemia (HCC) 2011   Hyperlipidemia    Hypertension     PAST SURGICAL HISTORY: Past Surgical  History:  Procedure Laterality Date   cataract surgery Bilateral 2005   CHOLECYSTECTOMY  1991   RECONSTRUCTION OF EYELID      FAMILY HISTORY Family History  Problem Relation Age of Onset   Hypertension Mother    Stroke Mother    CAD Mother    Hypertension Father    Heart attack Father    Hypertension Brother    Seizures Brother    Hypertension Son    Breast cancer Neg Hx     GYNECOLOGIC HISTORY:  No LMP recorded. Patient is postmenopausal.     ADVANCED DIRECTIVES:    HEALTH MAINTENANCE: Social History   Tobacco Use   Smoking status: Never   Smokeless tobacco: Never  Vaping Use   Vaping status: Never Used  Substance Use Topics   Alcohol use: No   Drug use: No     Colonoscopy:  PAP:  Bone density:  Lipid panel:  Allergies  Allergen Reactions   Hctz [Hydrochlorothiazide ]     Hyponatremia   Penicillins Rash    Current Outpatient Medications  Medication Sig Dispense Refill   atorvastatin  (LIPITOR) 40 MG tablet Take 40 mg by mouth daily.     COENZYME Q-10 PO Take 200 mg by mouth daily.      DULoxetine  (CYMBALTA ) 20 MG capsule Take 1 capsule (20 mg total) by mouth daily. 90 capsule 1   furosemide (LASIX) 20 MG tablet Take 1 tablet by mouth daily.     loperamide (IMODIUM) 1 MG/5ML solution Take 2 mg by mouth 4 (four) times daily as needed for diarrhea or loose stools.     metoprolol  succinate (TOPROL -XL) 50 MG 24 hr tablet Take 1 tablet (50 mg total) by mouth  daily. Take with or immediately following a meal. 90 tablet 1   OLANZapine  (ZYPREXA ) 2.5 MG tablet TAKE 1 TABLET BY MOUTH EVERYDAY AT BEDTIME 90 tablet 1   Potassium Chloride  ER 20 MEQ TBCR Take 1 tablet by mouth daily.     rivastigmine (EXELON) 1.5 MG capsule Take 1.5 mg by mouth 2 (two) times daily.     SODIUM FLUORIDE 5000 PPM 1.1 % PSTE See admin instructions.     telmisartan  (MICARDIS ) 40 MG tablet TAKE 2 TABLETS BY MOUTH EVERY DAY 180 tablet 0   vitamin B-12 (CYANOCOBALAMIN) 1000 MCG tablet Take  1,000 mcg by mouth daily.     ibrutinib  (IMBRUVICA ) 420 MG tablet TAKE 1 TABLET BY MOUTH DAILY. (Patient not taking: Reported on 12/20/2023) 28 tablet 5   No current facility-administered medications for this visit.    OBJECTIVE: BP (!) 110/40   Pulse 74   Temp 98.6 F (37 C)   Resp 20   Wt 124 lb 6.4 oz (56.4 kg)   SpO2 100%   BMI 23.51 kg/m    Body mass index is 23.51 kg/m.    ECOG FS:0 - Asymptomatic   General: Well-developed, well-nourished, no acute distress. Eyes: Pink conjunctiva, anicteric sclera. HEENT: Normocephalic, moist mucous membranes. Lungs: No audible wheezing or coughing. Heart: Regular rate and rhythm. Abdomen: Soft, nontender, no obvious distention. Musculoskeletal: No edema, cyanosis, or clubbing. Neuro: Alert, answering all questions appropriately. Cranial nerves grossly intact. Skin: No rashes or petechiae noted. Psych: Normal affect.  LAB RESULTS:  Appointment on 12/20/2023  Component Date Value Ref Range Status   WBC 12/20/2023 8.0  4.0 - 10.5 K/uL Final   RBC 12/20/2023 4.23  3.87 - 5.11 MIL/uL Final   Hemoglobin 12/20/2023 8.1 (L)  12.0 - 15.0 g/dL Final   Comment: Reticulocyte Hemoglobin testing may be clinically indicated, consider ordering this additional test [id number]    HCT 12/20/2023 28.2 (L)  36.0 - 46.0 % Final   MCV 12/20/2023 66.7 (L)  80.0 - 100.0 fL Final   MCH 12/20/2023 19.1 (L)  26.0 - 34.0 pg Final   MCHC 12/20/2023 28.7 (L)  30.0 - 36.0 g/dL Final   RDW 93/76/7974 24.9 (H)  11.5 - 15.5 % Final   Platelets 12/20/2023 282  150 - 400 K/uL Final   nRBC 12/20/2023 0.0  0.0 - 0.2 % Final   Neutrophils Relative % 12/20/2023 62  % Final   Neutro Abs 12/20/2023 4.9  1.7 - 7.7 K/uL Final   Lymphocytes Relative 12/20/2023 30  % Final   Lymphs Abs 12/20/2023 2.4  0.7 - 4.0 K/uL Final   Monocytes Relative 12/20/2023 7  % Final   Monocytes Absolute 12/20/2023 0.6  0.1 - 1.0 K/uL Final   Eosinophils Relative 12/20/2023 1  % Final    Eosinophils Absolute 12/20/2023 0.1  0.0 - 0.5 K/uL Final   Basophils Relative 12/20/2023 0  % Final   Basophils Absolute 12/20/2023 0.0  0.0 - 0.1 K/uL Final   Immature Granulocytes 12/20/2023 0  % Final   Abs Immature Granulocytes 12/20/2023 0.03  0.00 - 0.07 K/uL Final   Performed at [facility], [address] [place], [place] [address]    STUDIES: No results found.   ASSESSMENT: CLL.  PLAN:    CLL:  Patient initiated Imbruvica  in May 2021 and recently discontinued in May 2025 secondary to her worsening dementia.  Total white count remains within normal limits of 8.0 today.  Her most recent imaging on  Nov 09, 2022 reviewed independently with no significant lymphadenopathy noted.  No further imaging is necessary.  Continue to hold treatment.  Return to clinic in 4 to 5 weeks for repeat laboratory work and further evaluation at which point patient can likely be transitioned to appointments every 3 to 6 months.   Anemia: Hemoglobin improved to 8.1.  Patient does not require transfusion tomorrow.  Return to clinic as above. Renal insufficiency: Chronic and unchanged.  Patient most recent creatinine is 1.51.  I spent a total of 20 minutes reviewing chart data, face-to-face evaluation with the patient, counseling and coordination of care as detailed above.   Patient expressed understanding and was in agreement with this plan. She also understands that She can call clinic at any time with any questions, concerns, or complaints.    Evalene JINNY Reusing, MD   12/20/2023 10:31 AM

## 2023-12-21 ENCOUNTER — Inpatient Hospital Stay

## 2023-12-28 ENCOUNTER — Other Ambulatory Visit: Payer: Self-pay

## 2024-01-12 DIAGNOSIS — L570 Actinic keratosis: Secondary | ICD-10-CM | POA: Diagnosis not present

## 2024-01-12 DIAGNOSIS — D2271 Melanocytic nevi of right lower limb, including hip: Secondary | ICD-10-CM | POA: Diagnosis not present

## 2024-01-12 DIAGNOSIS — D2261 Melanocytic nevi of right upper limb, including shoulder: Secondary | ICD-10-CM | POA: Diagnosis not present

## 2024-01-12 DIAGNOSIS — L821 Other seborrheic keratosis: Secondary | ICD-10-CM | POA: Diagnosis not present

## 2024-01-12 DIAGNOSIS — X32XXXA Exposure to sunlight, initial encounter: Secondary | ICD-10-CM | POA: Diagnosis not present

## 2024-01-12 DIAGNOSIS — D485 Neoplasm of uncertain behavior of skin: Secondary | ICD-10-CM | POA: Diagnosis not present

## 2024-01-12 DIAGNOSIS — D225 Melanocytic nevi of trunk: Secondary | ICD-10-CM | POA: Diagnosis not present

## 2024-01-12 DIAGNOSIS — C4442 Squamous cell carcinoma of skin of scalp and neck: Secondary | ICD-10-CM | POA: Diagnosis not present

## 2024-01-12 DIAGNOSIS — D2262 Melanocytic nevi of left upper limb, including shoulder: Secondary | ICD-10-CM | POA: Diagnosis not present

## 2024-01-12 DIAGNOSIS — D2272 Melanocytic nevi of left lower limb, including hip: Secondary | ICD-10-CM | POA: Diagnosis not present

## 2024-01-12 DIAGNOSIS — D045 Carcinoma in situ of skin of trunk: Secondary | ICD-10-CM | POA: Diagnosis not present

## 2024-01-21 ENCOUNTER — Other Ambulatory Visit: Payer: Self-pay | Admitting: *Deleted

## 2024-01-21 DIAGNOSIS — C9110 Chronic lymphocytic leukemia of B-cell type not having achieved remission: Secondary | ICD-10-CM

## 2024-01-21 DIAGNOSIS — C8590 Non-Hodgkin lymphoma, unspecified, unspecified site: Secondary | ICD-10-CM

## 2024-01-21 NOTE — Progress Notes (Signed)
 SABRA

## 2024-01-24 ENCOUNTER — Ambulatory Visit: Admitting: Oncology

## 2024-01-24 ENCOUNTER — Other Ambulatory Visit

## 2024-01-24 ENCOUNTER — Inpatient Hospital Stay: Attending: Oncology

## 2024-01-24 ENCOUNTER — Inpatient Hospital Stay (HOSPITAL_BASED_OUTPATIENT_CLINIC_OR_DEPARTMENT_OTHER): Admitting: Hospice and Palliative Medicine

## 2024-01-24 VITALS — BP 126/45 | HR 66 | Temp 97.5°F | Resp 20 | Wt 119.2 lb

## 2024-01-24 DIAGNOSIS — C9110 Chronic lymphocytic leukemia of B-cell type not having achieved remission: Secondary | ICD-10-CM

## 2024-01-24 DIAGNOSIS — Z79899 Other long term (current) drug therapy: Secondary | ICD-10-CM | POA: Insufficient documentation

## 2024-01-24 DIAGNOSIS — C8590 Non-Hodgkin lymphoma, unspecified, unspecified site: Secondary | ICD-10-CM

## 2024-01-24 LAB — CBC WITH DIFFERENTIAL (CANCER CENTER ONLY)
Abs Immature Granulocytes: 0.02 K/uL (ref 0.00–0.07)
Basophils Absolute: 0 K/uL (ref 0.0–0.1)
Basophils Relative: 0 %
Eosinophils Absolute: 0.1 K/uL (ref 0.0–0.5)
Eosinophils Relative: 1 %
HCT: 28.9 % — ABNORMAL LOW (ref 36.0–46.0)
Hemoglobin: 8.3 g/dL — ABNORMAL LOW (ref 12.0–15.0)
Immature Granulocytes: 0 %
Lymphocytes Relative: 31 %
Lymphs Abs: 2.4 K/uL (ref 0.7–4.0)
MCH: 19.4 pg — ABNORMAL LOW (ref 26.0–34.0)
MCHC: 28.7 g/dL — ABNORMAL LOW (ref 30.0–36.0)
MCV: 67.5 fL — ABNORMAL LOW (ref 80.0–100.0)
Monocytes Absolute: 0.7 K/uL (ref 0.1–1.0)
Monocytes Relative: 9 %
Neutro Abs: 4.6 K/uL (ref 1.7–7.7)
Neutrophils Relative %: 59 %
Platelet Count: 294 K/uL (ref 150–400)
RBC: 4.28 MIL/uL (ref 3.87–5.11)
RDW: 21.2 % — ABNORMAL HIGH (ref 11.5–15.5)
WBC Count: 7.8 K/uL (ref 4.0–10.5)
nRBC: 0 % (ref 0.0–0.2)

## 2024-01-24 LAB — SAMPLE TO BLOOD BANK

## 2024-01-24 NOTE — Progress Notes (Signed)
 Symptom Management Clinic [facility] at [facility] Telephone:([phone]) [phone] Fax:([phone]  Patient Care Team: Myrla Jon HERO, MD as PCP - General (Family Medicine) Jacobo Evalene PARAS, MD as Consulting Physician (Oncology) Hester Wolm PARAS, MD as Consulting Physician (Cardiology) Jaye Fallow, MD as Referring Physician (Ophthalmology)   NAME OF PATIENT: Barbara Chambers  [id number]  [birth year]   DATE OF VISIT: 01/24/24  REASON FOR CONSULT: Barbara Chambers is a 89 y.o. female with multiple medical problems including CLL.   INTERVAL HISTORY: Patient last saw Dr. Jacobo on 12/20/2023 for evaluation of CLL.  Patient has been off Imbruvica  since May 2025 due to worsening dementia.  Her most recent imaging on 11/09/2022 showed no significant lymphadenopathy.  No further imaging was felt necessary unless there was concern for disease progression.  Patient presents Barbara Chambers today for follow-up labs and consideration of transfusion.  Patient accompanied by her son.  Patient reports she is doing well.  Denies any significant changes or concerns.  She does report recent scalp lesion and lesion on her chest with upcoming Mohs surgery.  This is followed by dermatology.  Denies recent fevers or illnesses. Denies any easy bleeding or bruising. Reports fair appetite and denies weight loss. Denies chest pain. Denies any nausea, vomiting, constipation, or diarrhea. Denies urinary complaints. Patient offers no further specific complaints today.   PAST MEDICAL HISTORY: Past Medical History:  Diagnosis Date   Chronic lymphocytic leukemia (HCC) 2011   Hyperlipidemia    Hypertension     PAST SURGICAL HISTORY:  Past Surgical History:  Procedure Laterality Date   cataract surgery Bilateral 2005   CHOLECYSTECTOMY  1991   RECONSTRUCTION OF EYELID      HEMATOLOGY/ONCOLOGY HISTORY:  Oncology History   No history exists.    ALLERGIES:  is allergic to hctz  [hydrochlorothiazide ] and penicillins.  MEDICATIONS:  Current Outpatient Medications  Medication Sig Dispense Refill   atorvastatin  (LIPITOR) 40 MG tablet Take 40 mg by mouth daily.     COENZYME Q-10 PO Take 200 mg by mouth daily.      DULoxetine  (CYMBALTA ) 20 MG capsule Take 1 capsule (20 mg total) by mouth daily. 90 capsule 1   furosemide (LASIX) 20 MG tablet Take 1 tablet by mouth daily.     ibrutinib  (IMBRUVICA ) 420 MG tablet TAKE 1 TABLET BY MOUTH DAILY. (Patient not taking: Reported on 12/20/2023) 28 tablet 5   loperamide (IMODIUM) 1 MG/5ML solution Take 2 mg by mouth 4 (four) times daily as needed for diarrhea or loose stools.     metoprolol  succinate (TOPROL -XL) 50 MG 24 hr tablet Take 1 tablet (50 mg total) by mouth daily. Take with or immediately following a meal. 90 tablet 1   OLANZapine  (ZYPREXA ) 2.5 MG tablet TAKE 1 TABLET BY MOUTH EVERYDAY AT BEDTIME 90 tablet 1   Potassium Chloride  ER 20 MEQ TBCR Take 1 tablet by mouth daily.     rivastigmine (EXELON) 1.5 MG capsule Take 1.5 mg by mouth 2 (two) times daily.     SODIUM FLUORIDE 5000 PPM 1.1 % PSTE See admin instructions.     telmisartan  (MICARDIS ) 40 MG tablet TAKE 2 TABLETS BY MOUTH EVERY DAY 180 tablet 0   vitamin B-12 (CYANOCOBALAMIN) 1000 MCG tablet Take 1,000 mcg by mouth daily.     No current facility-administered medications for this visit.    VITAL SIGNS: There were no vitals taken for this visit. There were no vitals filed for this visit.  Estimated body mass  index is 23.51 kg/m as calculated from the following:   Height as of 11/18/23: 5' 1 (1.549 m).   Weight as of 12/20/23: 124 lb 6.4 oz (56.4 kg).  LABS: CBC:    Component Value Date/Time   WBC 7.8 01/24/2024 0913   WBC 8.0 12/20/2023 0907   HGB 8.3 (L) 01/24/2024 0913   HGB 8.8 (L) 11/11/2022 0950   HCT 28.9 (L) 01/24/2024 0913   HCT 30.8 (L) 11/11/2022 0950   PLT 294 01/24/2024 0913   PLT 271 11/11/2022 0950   MCV 67.5 (L) 01/24/2024 0913   MCV 66  (L) 11/11/2022 0950   MCV 84 03/19/2014 1000   NEUTROABS 4.6 01/24/2024 0913   NEUTROABS 6.4 11/11/2022 0950   NEUTROABS 4.9 03/19/2014 1000   LYMPHSABS 2.4 01/24/2024 0913   LYMPHSABS 5.7 (H) 11/11/2022 0950   LYMPHSABS 8.1 (H) 03/19/2014 1000   MONOABS 0.7 01/24/2024 0913   MONOABS 0.6 03/19/2014 1000   EOSABS 0.1 01/24/2024 0913   EOSABS 0.1 11/11/2022 0950   EOSABS 0.1 03/19/2014 1000   BASOSABS 0.0 01/24/2024 0913   BASOSABS 0.1 11/11/2022 0950   BASOSABS 0.2 (H) 03/19/2014 1000   Comprehensive Metabolic Panel:    Component Value Date/Time   NA 140 11/18/2023 0922   NA 143 11/11/2022 0950   K 4.1 11/18/2023 0922   CL 108 11/18/2023 0922   CO2 24 11/18/2023 0922   BUN 33 (H) 11/18/2023 0922   BUN 17 11/11/2022 0950   CREATININE 1.51 (H) 11/18/2023 0922   CREATININE 0.78 04/20/2017 0914   GLUCOSE 101 (H) 11/18/2023 0922   CALCIUM  8.2 (L) 11/18/2023 0922   AST 20 11/18/2023 0922   ALT 13 11/18/2023 0922   ALT 27 02/15/2012 1101   ALKPHOS 76 11/18/2023 0922   ALKPHOS 89 02/15/2012 1101   BILITOT 0.7 11/18/2023 0922   PROT 5.7 (L) 11/18/2023 0922   PROT 5.7 (L) 11/11/2022 0950   PROT 6.9 02/15/2012 1101   ALBUMIN 3.2 (L) 11/18/2023 0922   ALBUMIN 3.8 11/11/2022 0950   ALBUMIN 3.7 02/15/2012 1101    RADIOGRAPHIC STUDIES: No results found.  PERFORMANCE STATUS (ECOG) : 1 - Symptomatic but completely ambulatory  Review of Systems Unless otherwise noted, a complete review of systems is negative.  Physical Exam General: NAD Cardiovascular: regular rate and rhythm Pulmonary: clear anterior/posterior fields Abdomen: soft, nontender, + bowel sounds GU: no suprapubic tenderness Extremities: BLE edema, no joint deformities Skin: Scalp lesion Neurological: Weakness, memory impairment but otherwise nonfocal  IMPRESSION/PLAN: CLL -Imbruvica  was discontinued in May 2025 due to worsening dementia.  Patient currently on surveillance.  Patient asymptomatic at today's  visit.  Anemia -hemoglobin improved to 8.3, up from 8.1 last month.  Patient does not require transfusion.  Will reassess labs in 3 months.  SCC scalp -pending Mohs surgery.  Followed by dermatology.  MD follow-up in 3 months.  Patient to be seen sooner if needed.   Patient expressed understanding and was in agreement with this plan. She also understands that She can call clinic at any time with any questions, concerns, or complaints.   Thank you for allowing me to participate in the care of this very pleasant patient.   Time Total: 15 minutes  Visit consisted of counseling and education dealing with the complex and emotionally intense issues of symptom management in the setting of serious illness.Greater than 50%  of this time was spent counseling and coordinating care related to the above assessment and plan.  Signed by:  Fonda Mower, PhD, NP-C

## 2024-01-25 ENCOUNTER — Inpatient Hospital Stay

## 2024-01-25 ENCOUNTER — Other Ambulatory Visit: Payer: Self-pay

## 2024-01-25 ENCOUNTER — Other Ambulatory Visit (HOSPITAL_COMMUNITY): Payer: Self-pay

## 2024-01-25 NOTE — Progress Notes (Signed)
 Patient's therapy was discontinued May of 2025 due to worsening dementia.  She will be under surveillance only at this time.  Disenrolling from Specialty Pharmacy services.

## 2024-01-26 ENCOUNTER — Other Ambulatory Visit: Payer: Self-pay | Admitting: Family Medicine

## 2024-01-26 DIAGNOSIS — I10 Essential (primary) hypertension: Secondary | ICD-10-CM

## 2024-02-06 ENCOUNTER — Other Ambulatory Visit: Payer: Self-pay | Admitting: Family Medicine

## 2024-02-06 DIAGNOSIS — B89 Unspecified parasitic disease: Secondary | ICD-10-CM

## 2024-02-06 DIAGNOSIS — I10 Essential (primary) hypertension: Secondary | ICD-10-CM

## 2024-02-22 DIAGNOSIS — Z1331 Encounter for screening for depression: Secondary | ICD-10-CM | POA: Diagnosis not present

## 2024-02-22 DIAGNOSIS — R4189 Other symptoms and signs involving cognitive functions and awareness: Secondary | ICD-10-CM | POA: Diagnosis not present

## 2024-02-22 DIAGNOSIS — R2689 Other abnormalities of gait and mobility: Secondary | ICD-10-CM | POA: Diagnosis not present

## 2024-02-24 DIAGNOSIS — D045 Carcinoma in situ of skin of trunk: Secondary | ICD-10-CM | POA: Diagnosis not present

## 2024-03-21 NOTE — Progress Notes (Signed)
 Pharmacy Quality Measure Review  This patient is appearing on a report for being at risk of failing the adherence measure for hypertension (ACEi/ARB) medications this calendar year.   Medication: telmisartan  Last fill date: 10/26/23 for 90 day supply  Spoke with son, Oneil, who manages her medications. He reports he fills a cup for her each morning with medications she is to take. Son confirmed she is taking 2 tablets daily of this medication. Reports she still has a good bit of stock left on hand but will need a refill in the next coming weeks.   Cornel Werber E. Marsh, PharmD Clinical Pharmacist [facility] [phone]

## 2024-03-28 ENCOUNTER — Ambulatory Visit (INDEPENDENT_AMBULATORY_CARE_PROVIDER_SITE_OTHER): Admitting: Family Medicine

## 2024-03-28 ENCOUNTER — Encounter: Payer: Self-pay | Admitting: Family Medicine

## 2024-03-28 VITALS — BP 131/58 | HR 59 | Ht 61.0 in | Wt 122.2 lb

## 2024-03-28 DIAGNOSIS — Z23 Encounter for immunization: Secondary | ICD-10-CM

## 2024-03-28 DIAGNOSIS — F331 Major depressive disorder, recurrent, moderate: Secondary | ICD-10-CM

## 2024-03-28 DIAGNOSIS — C9110 Chronic lymphocytic leukemia of B-cell type not having achieved remission: Secondary | ICD-10-CM

## 2024-03-28 DIAGNOSIS — E038 Other specified hypothyroidism: Secondary | ICD-10-CM | POA: Diagnosis not present

## 2024-03-28 DIAGNOSIS — I10 Essential (primary) hypertension: Secondary | ICD-10-CM | POA: Diagnosis not present

## 2024-03-28 DIAGNOSIS — F03B0 Unspecified dementia, moderate, without behavioral disturbance, psychotic disturbance, mood disturbance, and anxiety: Secondary | ICD-10-CM

## 2024-03-28 DIAGNOSIS — R739 Hyperglycemia, unspecified: Secondary | ICD-10-CM | POA: Diagnosis not present

## 2024-03-28 DIAGNOSIS — E782 Mixed hyperlipidemia: Secondary | ICD-10-CM | POA: Diagnosis not present

## 2024-03-28 DIAGNOSIS — C8590 Non-Hodgkin lymphoma, unspecified, unspecified site: Secondary | ICD-10-CM

## 2024-03-28 MED ORDER — DULOXETINE HCL 20 MG PO CPEP: 20.0000 mg | ORAL_CAPSULE | Freq: Every day | ORAL | 1 refills | Status: AC

## 2024-03-28 MED ORDER — TELMISARTAN 40 MG PO TABS: 80.0000 mg | ORAL_TABLET | Freq: Every day | ORAL | 1 refills | Status: AC

## 2024-03-28 NOTE — Assessment & Plan Note (Signed)
 F/b Onc No longer on imbruvica  Reviewed recent CBC

## 2024-03-28 NOTE — Assessment & Plan Note (Signed)
 Blood pressure is well-controlled on current medication regimen. - Continue telmisartan  80 mg daily, lasix 20 mg daily - Continue metoprolol  50 mg daily - Provide refills for telmisartan 

## 2024-03-28 NOTE — Assessment & Plan Note (Signed)
 Rivastigmine dose was recently increased to 3 mg twice daily for cognitive support. No adverse effects reported. - Continue rivastigmine 3 mg twice daily - f/b neuro - continue olanzapine 

## 2024-03-28 NOTE — Progress Notes (Signed)
 Established patient visit   Patient: Barbara Chambers   DOB: 1935/03/30   89 y.o. Female  MRN: [id number] Visit Date: 03/28/2024  Today's healthcare provider: Jon Eva, MD   Chief Complaint  Patient presents with   Medical Management of Chronic Issues    Patient is present with her son today for her 6 month follow-up. Reports she is taking medications as prescribed with no side effects tolerating them well. She reports no symptoms and no smoking. She would like to receive her influenza vaccine today in LD.    Hyperlipidemia   Hypotension   Subjective    Hyperlipidemia   HPI     Medical Management of Chronic Issues    Additional comments: Patient is present with her son today for her 6 month follow-up. Reports she is taking medications as prescribed with no side effects tolerating them well. She reports no symptoms and no smoking. She would like to receive her influenza vaccine today in LD.       Last edited by Lilian Fitzpatrick, CMA on 03/28/2024  8:29 AM.       Discussed the use of AI scribe software for clinical note transcription with the patient, who gave verbal consent to proceed.  History of Present Illness   Barbara Chambers is an 89 year old female who presents for a follow-up visit.  She is doing well overall with no specific concerns. She received her flu shot today. Her hypertension is managed with telmisartan  80 mg daily, which she tolerates well. A recent pharmacy inquiry about her telmisartan  intake was clarified as a prescription issue. She is on furosemide 20 mg daily, metoprolol  50 mg daily, and atorvastatin . Her rivastigmine dose was recently increased, and she is also on olanzapine  and duloxetine . She discontinued Imbruvica , with improved numbers following this change. An ulcer on her ankle has resolved, and a persistent spot on her head is scheduled for treatment next week. No recent falls or abdominal pain. She feels she has the necessary resources at  home.         Medications: Outpatient Medications Prior to Visit  Medication Sig   atorvastatin  (LIPITOR) 40 MG tablet Take 40 mg by mouth daily.   COENZYME Q-10 PO Take 200 mg by mouth daily.    furosemide (LASIX) 20 MG tablet Take 1 tablet by mouth daily.   loperamide (IMODIUM) 1 MG/5ML solution Take 2 mg by mouth 4 (four) times daily as needed for diarrhea or loose stools.   metoprolol  succinate (TOPROL -XL) 50 MG 24 hr tablet TAKE 1 TABLET BY MOUTH DAILY. TAKE WITH OR IMMEDIATELY FOLLOWING A MEAL.   OLANZapine  (ZYPREXA ) 2.5 MG tablet TAKE 1 TABLET BY MOUTH EVERYDAY AT BEDTIME   Potassium Chloride  ER 20 MEQ TBCR Take 1 tablet by mouth daily.   rivastigmine (EXELON) 3 MG capsule Take 3 mg by mouth 2 (two) times daily.   SODIUM FLUORIDE 5000 PPM 1.1 % PSTE See admin instructions.   vitamin B-12 (CYANOCOBALAMIN) 1000 MCG tablet Take 1,000 mcg by mouth daily.   [DISCONTINUED] DULoxetine  (CYMBALTA ) 20 MG capsule Take 1 capsule (20 mg total) by mouth daily.   [DISCONTINUED] ibrutinib  (IMBRUVICA ) 420 MG tablet TAKE 1 TABLET BY MOUTH DAILY.   [DISCONTINUED] telmisartan  (MICARDIS ) 40 MG tablet TAKE 2 TABLETS BY MOUTH EVERY DAY   No facility-administered medications prior to visit.    Review of Systems     Objective    BP (!) 131/58 (BP Location: Left Arm, Patient  Position: Sitting, Cuff Size: Normal)   Pulse (!) 59   Ht 5' 1 (1.549 m)   Wt 122 lb 3.2 oz (55.4 kg)   SpO2 100%   BMI 23.09 kg/m    Physical Exam Vitals reviewed.  Constitutional:      General: She is not in acute distress.    Appearance: Normal appearance. She is well-developed. She is not diaphoretic.  HENT:     Head: Normocephalic and atraumatic.  Eyes:     General: No scleral icterus.    Conjunctiva/sclera: Conjunctivae normal.  Neck:     Thyroid : No thyromegaly.  Cardiovascular:     Rate and Rhythm: Normal rate and regular rhythm.     Heart sounds: Normal heart sounds. No murmur heard. Pulmonary:      Effort: Pulmonary effort is normal. No respiratory distress.     Breath sounds: Normal breath sounds. No wheezing, rhonchi or rales.  Musculoskeletal:     Cervical back: Neck supple.     Right lower leg: No edema.     Left lower leg: No edema.  Lymphadenopathy:     Cervical: No cervical adenopathy.  Skin:    General: Skin is warm and dry.     Findings: No rash.  Neurological:     Mental Status: She is alert and oriented to person, place, and time. Mental status is at baseline.  Psychiatric:        Mood and Affect: Mood normal.        Behavior: Behavior normal.      No results found for any visits on 03/28/24.  Assessment & Plan     Problem List Items Addressed This Visit       Cardiovascular and Mediastinum   Benign essential HTN   Blood pressure is well-controlled on current medication regimen. - Continue telmisartan  80 mg daily, lasix 20 mg daily - Continue metoprolol  50 mg daily - Provide refills for telmisartan       Relevant Medications   telmisartan  (MICARDIS ) 40 MG tablet     Endocrine   Subclinical hypothyroidism - Primary   Thyroid  function has not been checked recently. No current symptoms reported. - Order thyroid  function tests      Relevant Orders   TSH + free T4     Nervous and Auditory   Moderate dementia without behavioral disturbance, psychotic disturbance, mood disturbance, or anxiety, unspecified dementia type (HCC)   Rivastigmine dose was recently increased to 3 mg twice daily for cognitive support. No adverse effects reported. - Continue rivastigmine 3 mg twice daily - f/b neuro - continue olanzapine       Relevant Medications   DULoxetine  (CYMBALTA ) 20 MG capsule     Other   CLL (chronic lymphocytic leukemia) (HCC)   F/b Onc No longer on imbruvica  Reviewed recent CBC      HLD (hyperlipidemia)   Cholesterol levels have not been checked in over a year. Previous levels were satisfactory. - Order cholesterol panel - Continue  atorvastatin  as prescribed by cardiology      Relevant Medications   telmisartan  (MICARDIS ) 40 MG tablet   Other Relevant Orders   Lipid panel   Comprehensive metabolic panel with GFR   Major depressive disorder, recurrent episode, moderate (HCC)   Mood is managed with current medication regimen. - Continue duloxetine  as prescribed - Ensure adequate supply of duloxetine  at pharmacy      Relevant Medications   DULoxetine  (CYMBALTA ) 20 MG capsule   Lymphoma, unspecified body region, unspecified lymphoma  type (HCC)   F/b Onc      Other Visit Diagnoses       Immunization due       Relevant Orders   Flu vaccine HIGH DOSE PF(Fluzone Trivalent) (Completed)     Hyperglycemia       Relevant Orders   Hemoglobin A1c        Return in about 6 months (around 09/25/2024) for CPE.       Jon Eva, MD  [facility] [phone] (phone) [phone] (fax)  [facility]

## 2024-03-28 NOTE — Assessment & Plan Note (Signed)
 Mood is managed with current medication regimen. - Continue duloxetine  as prescribed - Ensure adequate supply of duloxetine  at pharmacy

## 2024-03-28 NOTE — Assessment & Plan Note (Signed)
 Thyroid  function has not been checked recently. No current symptoms reported. - Order thyroid  function tests

## 2024-03-28 NOTE — Assessment & Plan Note (Signed)
 F/b Onc

## 2024-03-28 NOTE — Assessment & Plan Note (Signed)
 Cholesterol levels have not been checked in over a year. Previous levels were satisfactory. - Order cholesterol panel - Continue atorvastatin  as prescribed by cardiology

## 2024-03-29 LAB — LIPID PANEL
Chol/HDL Ratio: 2 ratio (ref 0.0–4.4)
Cholesterol, Total: 124 mg/dL (ref 100–199)
HDL: 61 mg/dL (ref >39)
LDL Chol Calc (NIH): 49 mg/dL (ref 0–99)
Triglycerides: 70 mg/dL (ref 0–149)
VLDL Cholesterol Cal: 14 mg/dL (ref 5–40)

## 2024-03-29 LAB — COMPREHENSIVE METABOLIC PANEL WITH GFR
ALT: 8 IU/L (ref 0–32)
AST: 16 IU/L (ref 0–40)
Albumin: 4 g/dL (ref 3.7–4.7)
Alkaline Phosphatase: 175 IU/L — ABNORMAL HIGH (ref 48–129)
BUN/Creatinine Ratio: 18 (ref 12–28)
BUN: 20 mg/dL (ref 8–27)
Bilirubin Total: 0.3 mg/dL (ref 0.0–1.2)
CO2: 23 mmol/L (ref 20–29)
Calcium: 9.2 mg/dL (ref 8.7–10.3)
Chloride: 104 mmol/L (ref 96–106)
Creatinine, Ser: 1.11 mg/dL — ABNORMAL HIGH (ref 0.57–1.00)
Globulin, Total: 2.7 g/dL (ref 1.5–4.5)
Glucose: 105 mg/dL — ABNORMAL HIGH (ref 70–99)
Potassium: 4.6 mmol/L (ref 3.5–5.2)
Sodium: 141 mmol/L (ref 134–144)
Total Protein: 6.7 g/dL (ref 6.0–8.5)
eGFR: 48 mL/min/1.73 — ABNORMAL LOW (ref >59)

## 2024-03-29 LAB — TSH+FREE T4
Free T4: 0.86 ng/dL (ref 0.82–1.77)
TSH: 9.26 u[IU]/mL — ABNORMAL HIGH (ref 0.450–4.500)

## 2024-03-29 LAB — HEMOGLOBIN A1C
Est. average glucose Bld gHb Est-mCnc: 143 mg/dL
Hgb A1c MFr Bld: 6.6 % — ABNORMAL HIGH (ref 4.8–5.6)

## 2024-03-30 ENCOUNTER — Ambulatory Visit: Payer: Self-pay | Admitting: Family Medicine

## 2024-03-30 DIAGNOSIS — R748 Abnormal levels of other serum enzymes: Secondary | ICD-10-CM

## 2024-04-06 DIAGNOSIS — L578 Other skin changes due to chronic exposure to nonionizing radiation: Secondary | ICD-10-CM | POA: Diagnosis not present

## 2024-04-06 DIAGNOSIS — C4442 Squamous cell carcinoma of skin of scalp and neck: Secondary | ICD-10-CM | POA: Diagnosis not present

## 2024-04-06 DIAGNOSIS — L988 Other specified disorders of the skin and subcutaneous tissue: Secondary | ICD-10-CM | POA: Diagnosis not present

## 2024-04-06 DIAGNOSIS — L814 Other melanin hyperpigmentation: Secondary | ICD-10-CM | POA: Diagnosis not present

## 2024-04-12 DIAGNOSIS — Z85828 Personal history of other malignant neoplasm of skin: Secondary | ICD-10-CM | POA: Diagnosis not present

## 2024-04-18 DIAGNOSIS — R748 Abnormal levels of other serum enzymes: Secondary | ICD-10-CM | POA: Diagnosis not present

## 2024-04-21 LAB — ALKALINE PHOSPHATASE, ISOENZYMES
Alkaline Phosphatase: 152 IU/L — ABNORMAL HIGH (ref 48–129)
BONE FRACTION: 37 % (ref 14–68)
INTESTINAL FRAC.: 3 % (ref 0–18)
LIVER FRACTION: 60 % (ref 18–85)

## 2024-04-21 LAB — CALCIUM: Calcium: 9.2 mg/dL (ref 8.7–10.3)

## 2024-04-21 LAB — PARATHYROID HORMONE, INTACT (NO CA): PTH: 46 pg/mL (ref 15–65)

## 2024-04-21 LAB — VITAMIN D 25 HYDROXY (VIT D DEFICIENCY, FRACTURES): Vit D, 25-Hydroxy: 11.9 ng/mL — ABNORMAL LOW (ref 30.0–100.0)

## 2024-04-21 MED ORDER — VITAMIN D (ERGOCALCIFEROL) 1.25 MG (50000 UNIT) PO CAPS
50000.0000 [IU] | ORAL_CAPSULE | ORAL | 1 refills | Status: AC
Start: 2024-04-21 — End: ?

## 2024-04-24 ENCOUNTER — Other Ambulatory Visit: Payer: Self-pay

## 2024-04-24 DIAGNOSIS — C8590 Non-Hodgkin lymphoma, unspecified, unspecified site: Secondary | ICD-10-CM

## 2024-04-24 DIAGNOSIS — C9110 Chronic lymphocytic leukemia of B-cell type not having achieved remission: Secondary | ICD-10-CM

## 2024-04-25 ENCOUNTER — Inpatient Hospital Stay: Attending: Oncology

## 2024-04-25 ENCOUNTER — Ambulatory Visit: Admitting: Oncology

## 2024-04-25 ENCOUNTER — Encounter: Payer: Self-pay | Admitting: Oncology

## 2024-04-25 ENCOUNTER — Inpatient Hospital Stay: Admitting: Oncology

## 2024-04-25 ENCOUNTER — Other Ambulatory Visit

## 2024-04-25 VITALS — BP 118/50 | HR 63 | Resp 16 | Ht 61.0 in | Wt 122.0 lb

## 2024-04-25 DIAGNOSIS — D649 Anemia, unspecified: Secondary | ICD-10-CM | POA: Diagnosis not present

## 2024-04-25 DIAGNOSIS — C8590 Non-Hodgkin lymphoma, unspecified, unspecified site: Secondary | ICD-10-CM

## 2024-04-25 DIAGNOSIS — C9110 Chronic lymphocytic leukemia of B-cell type not having achieved remission: Secondary | ICD-10-CM

## 2024-04-25 DIAGNOSIS — N289 Disorder of kidney and ureter, unspecified: Secondary | ICD-10-CM | POA: Insufficient documentation

## 2024-04-25 LAB — CBC WITH DIFFERENTIAL (CANCER CENTER ONLY)
Abs Immature Granulocytes: 0.05 K/uL (ref 0.00–0.07)
Basophils Absolute: 0 K/uL (ref 0.0–0.1)
Basophils Relative: 0 %
Eosinophils Absolute: 0.1 K/uL (ref 0.0–0.5)
Eosinophils Relative: 1 %
HCT: 30.2 % — ABNORMAL LOW (ref 36.0–46.0)
Hemoglobin: 8.8 g/dL — ABNORMAL LOW (ref 12.0–15.0)
Immature Granulocytes: 1 %
Lymphocytes Relative: 34 %
Lymphs Abs: 3.7 K/uL (ref 0.7–4.0)
MCH: 20.2 pg — ABNORMAL LOW (ref 26.0–34.0)
MCHC: 29.1 g/dL — ABNORMAL LOW (ref 30.0–36.0)
MCV: 69.3 fL — ABNORMAL LOW (ref 80.0–100.0)
Monocytes Absolute: 0.7 K/uL (ref 0.1–1.0)
Monocytes Relative: 6 %
Neutro Abs: 6.4 K/uL (ref 1.7–7.7)
Neutrophils Relative %: 58 %
Platelet Count: 363 K/uL (ref 150–400)
RBC: 4.36 MIL/uL (ref 3.87–5.11)
RDW: 19.9 % — ABNORMAL HIGH (ref 11.5–15.5)
WBC Count: 10.9 K/uL — ABNORMAL HIGH (ref 4.0–10.5)
nRBC: 0 % (ref 0.0–0.2)

## 2024-04-25 LAB — SAMPLE TO BLOOD BANK

## 2024-04-25 NOTE — Progress Notes (Signed)
 [facility]  Telephone:(336) [phone]  Fax:(336) [phone]     Barbara Chambers DOB: Aug 19, 1934  MR#: [id number]  RDW#:[id number]  Patient Care Team: Myrla Jon HERO, MD as PCP - General (Family Medicine) Jacobo Evalene PARAS, MD as Consulting Physician (Oncology) Hester Wolm PARAS, MD as Consulting Physician (Cardiology) Jaye Fallow, MD as Referring Physician (Ophthalmology)   CHIEF COMPLAINT: CLL.  INTERVAL HISTORY: Patient returns to clinic today for repeat laboratory and further evaluation.  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  She continues to have a declining memory and the entire history is given by her son.  She does not complain of any weakness or fatigue.  She has no neurologic complaints. She denies any fevers, night sweats, or weight loss. She denies any chest pain, shortness of breath, cough, or hemoptysis.  She denies any nausea, vomiting, constipation, or diarrhea. She has no urinary complaints.  Patient offers no further specific complaints today.  REVIEW OF SYSTEMS:   Review of Systems  Constitutional: Negative.  Negative for diaphoresis, fever, malaise/fatigue and weight loss.  Respiratory: Negative.  Negative for cough and shortness of breath.   Cardiovascular: Negative.  Negative for chest pain and leg swelling.  Gastrointestinal: Negative.  Negative for abdominal pain.  Genitourinary: Negative.  Negative for dysuria.  Musculoskeletal: Negative.  Negative for back pain.  Skin: Negative.  Negative for rash.  Neurological: Negative.  Negative for dizziness, sensory change, focal weakness, weakness and headaches.  Psychiatric/Behavioral:  Positive for memory loss. Negative for depression. The patient is not nervous/anxious.     As per HPI. Otherwise, a complete review of systems is negative.  PAST MEDICAL HISTORY: Past Medical History:  Diagnosis Date   Chronic lymphocytic leukemia (HCC)  2011   Hyperlipidemia    Hypertension     PAST SURGICAL HISTORY: Past Surgical History:  Procedure Laterality Date   cataract surgery Bilateral 2005   CHOLECYSTECTOMY  1991   RECONSTRUCTION OF EYELID      FAMILY HISTORY Family History  Problem Relation Age of Onset   Hypertension Mother    Stroke Mother    CAD Mother    Hypertension Father    Heart attack Father    Hypertension Brother    Seizures Brother    Hypertension Son    Breast cancer Neg Hx     GYNECOLOGIC HISTORY:  No LMP recorded. Patient is postmenopausal.     ADVANCED DIRECTIVES:    HEALTH MAINTENANCE: Social History   Tobacco Use   Smoking status: Never   Smokeless tobacco: Never  Vaping Use   Vaping status: Never Used  Substance Use Topics   Alcohol use: No   Drug use: No     Colonoscopy:  PAP:  Bone density:  Lipid panel:  Allergies  Allergen Reactions   Hctz [Hydrochlorothiazide ]     Hyponatremia   Penicillins Rash    Current Outpatient Medications  Medication Sig Dispense Refill   atorvastatin  (LIPITOR) 40 MG tablet Take 40 mg by mouth daily.     COENZYME Q-10 PO Take 200 mg by mouth daily.      DULoxetine  (CYMBALTA ) 20 MG capsule Take 1 capsule (20 mg total) by mouth daily. 90 capsule 1   furosemide (LASIX) 20 MG tablet Take 1 tablet by mouth daily.     loperamide (IMODIUM) 1 MG/5ML solution Take 2 mg by mouth 4 (four) times daily as needed for diarrhea or loose stools.  metoprolol  succinate (TOPROL -XL) 50 MG 24 hr tablet TAKE 1 TABLET BY MOUTH DAILY. TAKE WITH OR IMMEDIATELY FOLLOWING A MEAL. 90 tablet 1   OLANZapine  (ZYPREXA ) 2.5 MG tablet TAKE 1 TABLET BY MOUTH EVERYDAY AT BEDTIME 90 tablet 1   Potassium Chloride  ER 20 MEQ TBCR Take 1 tablet by mouth daily.     rivastigmine (EXELON) 3 MG capsule Take 3 mg by mouth 2 (two) times daily.     SODIUM FLUORIDE 5000 PPM 1.1 % PSTE See admin instructions.     telmisartan  (MICARDIS ) 40 MG tablet Take 2 tablets (80 mg total) by mouth  daily. 180 tablet 1   vitamin B-12 (CYANOCOBALAMIN) 1000 MCG tablet Take 1,000 mcg by mouth daily.     Vitamin D, Ergocalciferol, (DRISDOL) 1.25 MG (50000 UNIT) CAPS capsule Take 1 capsule (50,000 Units total) by mouth every 7 (seven) days. 12 capsule 1   No current facility-administered medications for this visit.    OBJECTIVE: BP (!) 118/50 (BP Location: Right Arm, Patient Position: Sitting, Cuff Size: Normal)   Pulse 63   Resp 16   Ht 5' 1 (1.549 m)   Wt 122 lb (55.3 kg)   SpO2 93%   BMI 23.05 kg/m    Body mass index is 23.05 kg/m.    ECOG FS:0 - Asymptomatic   General: Well-developed, well-nourished, no acute distress. Eyes: Pink conjunctiva, anicteric sclera. HEENT: Normocephalic, moist mucous membranes. Lungs: No audible wheezing or coughing. Heart: Regular rate and rhythm. Abdomen: Soft, nontender, no obvious distention. Musculoskeletal: No edema, cyanosis, or clubbing. Neuro: Confused. Cranial nerves grossly intact. Skin: No rashes or petechiae noted. Psych: Normal affect.  LAB RESULTS:  Appointment on 04/25/2024  Component Date Value Ref Range Status   WBC Count 04/25/2024 10.9 (H)  4.0 - 10.5 K/uL Final   RBC 04/25/2024 4.36  3.87 - 5.11 MIL/uL Final   Hemoglobin 04/25/2024 8.8 (L)  12.0 - 15.0 g/dL Final   Comment: Reticulocyte Hemoglobin testing may be clinically indicated, consider ordering this additional test [id number]    HCT 04/25/2024 30.2 (L)  36.0 - 46.0 % Final   MCV 04/25/2024 69.3 (L)  80.0 - 100.0 fL Final   MCH 04/25/2024 20.2 (L)  26.0 - 34.0 pg Final   MCHC 04/25/2024 29.1 (L)  30.0 - 36.0 g/dL Final   RDW 89/71/7974 19.9 (H)  11.5 - 15.5 % Final   Platelet Count 04/25/2024 363  150 - 400 K/uL Final   nRBC 04/25/2024 0.0  0.0 - 0.2 % Final   Neutrophils Relative % 04/25/2024 58  % Final   Neutro Abs 04/25/2024 6.4  1.7 - 7.7 K/uL Final   Lymphocytes Relative 04/25/2024 34  % Final   Lymphs Abs 04/25/2024 3.7  0.7 - 4.0 K/uL Final    Monocytes Relative 04/25/2024 6  % Final   Monocytes Absolute 04/25/2024 0.7  0.1 - 1.0 K/uL Final   Eosinophils Relative 04/25/2024 1  % Final   Eosinophils Absolute 04/25/2024 0.1  0.0 - 0.5 K/uL Final   Basophils Relative 04/25/2024 0  % Final   Basophils Absolute 04/25/2024 0.0  0.0 - 0.1 K/uL Final   Immature Granulocytes 04/25/2024 1  % Final   Abs Immature Granulocytes 04/25/2024 0.05  0.00 - 0.07 K/uL Final   Performed at [facility], [address] [place], [place] [address]    STUDIES: No results found.   ASSESSMENT: CLL.  PLAN:    CLL:  Patient initiated Imbruvica  in May 2021 and  recently discontinued in May 2025 secondary to her worsening dementia.  Total white count is slightly elevated at 10.9, but her differential was within normal limits. Her most recent imaging on Nov 09, 2022 reviewed independently with no significant lymphadenopathy noted.  No further imaging is necessary.  Continue to hold treatment.  [name] has requested less frequent follow-up, therefore she will return to clinic in 6 months with repeat laboratory work and further evaluation. Anemia: Hemoglobin trended up to 8.8.  Continue to hold Imbruvica  as above.  Return to clinic as above. Renal insufficiency: Improving.  Patient's most recent creatinine is 1.11.   Patient expressed understanding and was in agreement with this plan. She also understands that She can call clinic at any time with any questions, concerns, or complaints.    Evalene JINNY Reusing, MD   04/25/2024 9:56 AM

## 2024-04-25 NOTE — Progress Notes (Signed)
 Patient recently had a spot removed from the top of her head, and her son has a question about that. She is doing ok.

## 2024-04-26 ENCOUNTER — Encounter

## 2024-04-26 ENCOUNTER — Inpatient Hospital Stay

## 2024-05-02 DIAGNOSIS — Z85828 Personal history of other malignant neoplasm of skin: Secondary | ICD-10-CM | POA: Diagnosis not present

## 2024-09-14 ENCOUNTER — Ambulatory Visit (INDEPENDENT_AMBULATORY_CARE_PROVIDER_SITE_OTHER): Admitting: Vascular Surgery

## 2024-09-28 ENCOUNTER — Encounter: Admitting: Family Medicine

## 2024-10-24 ENCOUNTER — Encounter

## 2024-10-26 ENCOUNTER — Inpatient Hospital Stay

## 2024-10-26 ENCOUNTER — Inpatient Hospital Stay: Admitting: Oncology
